# Patient Record
Sex: Female | Born: 1945 | Race: White | Hispanic: No | Marital: Single | Smoking: Never smoker
Health system: Southern US, Community
[De-identification: ages and names within clinical notes are randomized; demographics above are authoritative.]

## PROBLEM LIST (undated history)

## (undated) DIAGNOSIS — G629 Polyneuropathy, unspecified: Secondary | ICD-10-CM

## (undated) DIAGNOSIS — I440 Atrioventricular block, first degree: Secondary | ICD-10-CM

## (undated) DIAGNOSIS — N183 Chronic kidney disease, stage 3 unspecified: Secondary | ICD-10-CM

## (undated) DIAGNOSIS — I890 Lymphedema, not elsewhere classified: Secondary | ICD-10-CM

## (undated) DIAGNOSIS — E7800 Pure hypercholesterolemia, unspecified: Secondary | ICD-10-CM

## (undated) DIAGNOSIS — Z9289 Personal history of other medical treatment: Secondary | ICD-10-CM

## (undated) DIAGNOSIS — M1990 Unspecified osteoarthritis, unspecified site: Secondary | ICD-10-CM

## (undated) DIAGNOSIS — E1142 Type 2 diabetes mellitus with diabetic polyneuropathy: Secondary | ICD-10-CM

## (undated) DIAGNOSIS — I5189 Other ill-defined heart diseases: Secondary | ICD-10-CM

## (undated) DIAGNOSIS — E785 Hyperlipidemia, unspecified: Secondary | ICD-10-CM

## (undated) DIAGNOSIS — K219 Gastro-esophageal reflux disease without esophagitis: Secondary | ICD-10-CM

## (undated) DIAGNOSIS — E119 Type 2 diabetes mellitus without complications: Secondary | ICD-10-CM

## (undated) DIAGNOSIS — G4733 Obstructive sleep apnea (adult) (pediatric): Secondary | ICD-10-CM

## (undated) DIAGNOSIS — I872 Venous insufficiency (chronic) (peripheral): Secondary | ICD-10-CM

## (undated) DIAGNOSIS — Z8719 Personal history of other diseases of the digestive system: Secondary | ICD-10-CM

## (undated) DIAGNOSIS — J309 Allergic rhinitis, unspecified: Secondary | ICD-10-CM

## (undated) DIAGNOSIS — I779 Disorder of arteries and arterioles, unspecified: Secondary | ICD-10-CM

## (undated) DIAGNOSIS — G4730 Sleep apnea, unspecified: Secondary | ICD-10-CM

## (undated) DIAGNOSIS — I10 Essential (primary) hypertension: Secondary | ICD-10-CM

## (undated) DIAGNOSIS — R011 Cardiac murmur, unspecified: Secondary | ICD-10-CM

## (undated) HISTORY — PX: NASAL SINUS SURGERY: SHX719

## (undated) HISTORY — PX: TONSILLECTOMY: SUR1361

## (undated) HISTORY — DX: Obstructive sleep apnea (adult) (pediatric): G47.33

## (undated) HISTORY — PX: BREAST CYST EXCISION: SHX579

## (undated) HISTORY — PX: CYST EXCISION: SHX5701

## (undated) HISTORY — DX: Personal history of other diseases of the digestive system: Z87.19

## (undated) HISTORY — DX: Hyperlipidemia, unspecified: E78.5

---

## 1998-01-04 ENCOUNTER — Ambulatory Visit (HOSPITAL_COMMUNITY): Admission: RE | Admit: 1998-01-04 | Discharge: 1998-01-04 | Payer: Self-pay | Admitting: Family Medicine

## 1998-01-26 ENCOUNTER — Encounter: Payer: Self-pay | Admitting: Family Medicine

## 1998-01-26 ENCOUNTER — Ambulatory Visit (HOSPITAL_COMMUNITY): Admission: RE | Admit: 1998-01-26 | Discharge: 1998-01-26 | Payer: Self-pay | Admitting: Family Medicine

## 1999-02-07 ENCOUNTER — Encounter: Payer: Self-pay | Admitting: Family Medicine

## 1999-02-07 ENCOUNTER — Ambulatory Visit (HOSPITAL_COMMUNITY): Admission: RE | Admit: 1999-02-07 | Discharge: 1999-02-07 | Payer: Self-pay | Admitting: Family Medicine

## 2000-02-12 ENCOUNTER — Encounter: Payer: Self-pay | Admitting: Family Medicine

## 2000-02-12 ENCOUNTER — Ambulatory Visit (HOSPITAL_COMMUNITY): Admission: RE | Admit: 2000-02-12 | Discharge: 2000-02-12 | Payer: Self-pay | Admitting: Family Medicine

## 2001-03-06 ENCOUNTER — Ambulatory Visit (HOSPITAL_COMMUNITY): Admission: RE | Admit: 2001-03-06 | Discharge: 2001-03-06 | Payer: Self-pay | Admitting: Family Medicine

## 2001-03-06 ENCOUNTER — Encounter: Payer: Self-pay | Admitting: Family Medicine

## 2001-04-16 ENCOUNTER — Other Ambulatory Visit: Admission: RE | Admit: 2001-04-16 | Discharge: 2001-04-16 | Payer: Self-pay | Admitting: Family Medicine

## 2002-02-17 ENCOUNTER — Encounter: Admission: RE | Admit: 2002-02-17 | Discharge: 2002-05-18 | Payer: Self-pay | Admitting: Family Medicine

## 2002-03-13 ENCOUNTER — Ambulatory Visit (HOSPITAL_COMMUNITY): Admission: RE | Admit: 2002-03-13 | Discharge: 2002-03-13 | Payer: Self-pay | Admitting: Family Medicine

## 2002-03-13 ENCOUNTER — Encounter: Payer: Self-pay | Admitting: Family Medicine

## 2002-06-09 ENCOUNTER — Encounter: Admission: RE | Admit: 2002-06-09 | Discharge: 2002-09-07 | Payer: Self-pay | Admitting: Family Medicine

## 2003-01-26 ENCOUNTER — Ambulatory Visit (HOSPITAL_COMMUNITY): Admission: RE | Admit: 2003-01-26 | Discharge: 2003-01-26 | Payer: Self-pay | Admitting: *Deleted

## 2003-02-15 ENCOUNTER — Other Ambulatory Visit: Payer: Self-pay

## 2003-03-22 ENCOUNTER — Encounter: Admission: RE | Admit: 2003-03-22 | Discharge: 2003-06-20 | Payer: Self-pay | Admitting: Family Medicine

## 2003-04-05 ENCOUNTER — Ambulatory Visit (HOSPITAL_COMMUNITY): Admission: RE | Admit: 2003-04-05 | Discharge: 2003-04-05 | Payer: Self-pay | Admitting: Family Medicine

## 2004-04-25 ENCOUNTER — Ambulatory Visit: Payer: Self-pay | Admitting: Family Medicine

## 2005-05-24 ENCOUNTER — Ambulatory Visit: Payer: Self-pay | Admitting: Family Medicine

## 2005-12-09 ENCOUNTER — Emergency Department: Payer: Self-pay | Admitting: Internal Medicine

## 2006-07-09 ENCOUNTER — Ambulatory Visit: Payer: Self-pay | Admitting: Family Medicine

## 2006-07-17 ENCOUNTER — Ambulatory Visit: Payer: Self-pay | Admitting: Family Medicine

## 2007-07-11 ENCOUNTER — Ambulatory Visit: Payer: Self-pay | Admitting: Family Medicine

## 2008-02-03 ENCOUNTER — Ambulatory Visit: Payer: Self-pay | Admitting: Sports Medicine

## 2008-02-03 ENCOUNTER — Encounter (INDEPENDENT_AMBULATORY_CARE_PROVIDER_SITE_OTHER): Payer: Self-pay | Admitting: *Deleted

## 2008-02-03 DIAGNOSIS — M7670 Peroneal tendinitis, unspecified leg: Secondary | ICD-10-CM | POA: Insufficient documentation

## 2008-02-03 DIAGNOSIS — I10 Essential (primary) hypertension: Secondary | ICD-10-CM | POA: Insufficient documentation

## 2008-02-03 DIAGNOSIS — Q667 Congenital pes cavus, unspecified foot: Secondary | ICD-10-CM

## 2008-02-03 DIAGNOSIS — E119 Type 2 diabetes mellitus without complications: Secondary | ICD-10-CM | POA: Insufficient documentation

## 2008-02-03 DIAGNOSIS — E1159 Type 2 diabetes mellitus with other circulatory complications: Secondary | ICD-10-CM | POA: Insufficient documentation

## 2008-02-03 DIAGNOSIS — M7660 Achilles tendinitis, unspecified leg: Secondary | ICD-10-CM | POA: Insufficient documentation

## 2008-02-03 DIAGNOSIS — M7750 Other enthesopathy of unspecified foot: Secondary | ICD-10-CM | POA: Insufficient documentation

## 2008-02-03 HISTORY — DX: Congenital pes cavus, unspecified foot: Q66.70

## 2008-03-16 ENCOUNTER — Encounter (INDEPENDENT_AMBULATORY_CARE_PROVIDER_SITE_OTHER): Payer: Self-pay | Admitting: *Deleted

## 2008-03-16 ENCOUNTER — Ambulatory Visit: Payer: Self-pay | Admitting: Sports Medicine

## 2008-04-20 ENCOUNTER — Ambulatory Visit: Payer: Self-pay | Admitting: Sports Medicine

## 2008-04-20 DIAGNOSIS — M6530 Trigger finger, unspecified finger: Secondary | ICD-10-CM | POA: Insufficient documentation

## 2008-06-01 ENCOUNTER — Ambulatory Visit: Payer: Self-pay | Admitting: Sports Medicine

## 2008-06-01 DIAGNOSIS — M25569 Pain in unspecified knee: Secondary | ICD-10-CM | POA: Insufficient documentation

## 2008-06-15 ENCOUNTER — Telehealth (INDEPENDENT_AMBULATORY_CARE_PROVIDER_SITE_OTHER): Payer: Self-pay | Admitting: *Deleted

## 2008-06-29 ENCOUNTER — Ambulatory Visit: Payer: Self-pay | Admitting: Sports Medicine

## 2008-06-29 DIAGNOSIS — IMO0002 Reserved for concepts with insufficient information to code with codable children: Secondary | ICD-10-CM | POA: Insufficient documentation

## 2008-07-14 ENCOUNTER — Ambulatory Visit: Payer: Self-pay | Admitting: Sports Medicine

## 2008-08-10 ENCOUNTER — Ambulatory Visit: Payer: Self-pay | Admitting: Sports Medicine

## 2008-08-11 ENCOUNTER — Ambulatory Visit: Payer: Self-pay | Admitting: Family Medicine

## 2008-08-13 ENCOUNTER — Ambulatory Visit: Payer: Self-pay | Admitting: Sports Medicine

## 2008-10-19 ENCOUNTER — Ambulatory Visit: Payer: Self-pay | Admitting: Sports Medicine

## 2008-12-08 LAB — HM PAP SMEAR: HM PAP: NORMAL

## 2009-02-05 HISTORY — PX: HEEL SPUR SURGERY: SHX665

## 2009-02-24 ENCOUNTER — Ambulatory Visit: Payer: Self-pay | Admitting: Sports Medicine

## 2009-04-04 ENCOUNTER — Ambulatory Visit: Payer: Self-pay | Admitting: Sports Medicine

## 2009-06-17 ENCOUNTER — Ambulatory Visit: Payer: Self-pay | Admitting: Sports Medicine

## 2009-07-07 ENCOUNTER — Ambulatory Visit: Payer: Self-pay | Admitting: Sports Medicine

## 2009-09-05 ENCOUNTER — Ambulatory Visit: Payer: Self-pay | Admitting: Sports Medicine

## 2009-09-27 ENCOUNTER — Ambulatory Visit: Payer: Self-pay | Admitting: Family Medicine

## 2010-03-07 NOTE — Assessment & Plan Note (Signed)
Summary: FU HEEL, KNEE, THUMB PAIN   Vital Signs:  Patient profile:   65 year old female Pulse rate:   73 / minute BP sitting:   144 / 72  (right arm)  Vitals Entered By: Terese Door (September 05, 2009 8:49 AM) CC: F/U heel, knee & thumb pain   CC:  F/U heel and knee & thumb pain.  History of Present Illness: Lt AT is much better and non painful after NTG RX haa now stopped NTG  Rt great toe at IP is swollen but not painful  Rt AT post op now 9 wks and is stiff but not painful  LT knee med meniscus is OK but fell at surgery and Lat side hurts  RT trigger thumb is better p injection  Overall doing better and here for advice  Physical Exam  General:  Well-developed,well-nourished,in no acute distress; alert,appropriate and cooperative throughout examination Msk:  RT AT shows post op changes the hag;lund's deformity has been removed not TTP  LT AT is smaller and is not TTP  Left knee exam shows no effusion; stable ligaments; negative Mcmurray's and provocative meniscal tests;  However lacks some full extension non painful patellar compression; patellar and quadriceps tendons unremarkable.  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. Aaa 4grms qid 2)  Nitroglycerin 0.4 Mg/hr Pt24 (Nitroglycerin) .... 1/4 patch to affect areas x 12 hours daily 3)  Nateglinide 60 Mg Tabs (Nateglinide) .Marland Kitchen.. 1 by mouth two times a day 30 mins ac 4)  Janumet 50-1000 Mg Tabs (Sitagliptin-metformin hcl) .Marland Kitchen.. 1 by mouth bid

## 2010-03-07 NOTE — Assessment & Plan Note (Signed)
Summary: FU ACHILLES AND L KNEE PAIN   Vital Signs:  Patient profile:   65 year old female BP sitting:   160 / 60  Vitals Entered By: Lillia Pauls CMA (October 19, 2008 8:56 AM)  History of Present Illness: Left Knee knee brace does help still not as confident with left still 0pain that occurs on medail jt line left has not locked gave way one time after doing a lot of steps not much swelling frustrated that pain does come intermittently  RT AT at start of treatment pain level was 10/10 now level is on avg day 3/10  Lt AT at start was 10/10 now avergae level is 2/10  not doing many of her exercises she is faithful with NTG and does think this keeps pain level down  cerrtain shoes make haglund on RT much worse  Physical Exam  General:  obese,in no acute distress; alert,appropriate and cooperative throughout examination Msk:  RT knee exam shows minimal effusion in suprapatellar pouch area only; stable ligaments; negative Mcmurray's and provocative meniscal tests; non painful patellar compression; patellar and quadriceps tendons unremarkable some mild medial joint line tenderness on RT  LT knee exam by comparison only less swelling and no joint line tenderness  RT AT is thick but not painful except at haglunds area  LT AT is thick and irregular at insertion non tender Additional Exam:  US exam is compared to previous ones RT shows thickness about 0.7 on long scan - this is overall improvement Also shows transverse thickness of 1.7 biggest change is increased vascularity and neovessels at medial insertion and going into haglunds calcific spurring here as well  Lt is actually thicker than RT at 1.03 transverse scan shows 2.04 this is not much change from before and there are intratendinous calcifications NO doppler actiivity however  images saved   Impression & Recommendations:  Problem # 1:  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)  RT is persistently  more painful but 70% improved over start left is not changed as much with appearance but pain is minimal  she can cont NTG as it clinically seems to help her she really needs to get into exercises more - only does a few  cont with walking program reck in 3months  Orders: US EXTREMITY NON-VASC REAL-TIME IMG (72536)  Problem # 2:  MEDIAL MENISCUS TEAR, LEFT (ICD-836.0) cont use of donjoy I think she is basically stalbe with conservative care if this worsens would refer for ortho opinion  Problem # 3:  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 Aaa 4grms qid 2)  Nitroglycerin 0.4 Mg/hr Pt24 (Nitroglycerin) .... 1/4 patch to affect areas x 12 hours daily

## 2010-03-07 NOTE — Letter (Signed)
Summary: *Referral Letter  [facility]  [address]   [place], [place] [address]   Phone: [phone]  Fax: [phone]    04/04/2009 Cindy Baker, MD [facility]  Dear Cindy Hester:  Thank you in advance for agreeing to see my patient:  Cindy Hester [address] [place], [place]  [address]  Phone: [phone]  Reason for Referral: we have tried a year of conservative care for bilateral, chronic severe Achilles Tendinopathy.  Left AT has become pain free.  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  Sincerely,  Vincent Gros MD

## 2010-03-07 NOTE — Letter (Signed)
Summary: *Consult Note  [facility]  [address]   [place], [place] [address]   Phone: [phone]  Fax: [phone]    Re:    Cindy Hester [facility] DOB:    1945/03/20 Dr. Julieanne Manson [facility] Fax: [phone]  Dear Cindy Hester:    Thank you for requesting that we see the above patient for consultation. I have been following her for most of the year to try to get her Achilles tendons to heal without surgery.  She definitely wants to avoid the surgery if possible.  The left - which was the worst at onset - is pretty much symptom free.  The Right AT is still a problem and so today we tried a new [place] Brace called an Achillotrain.  We continue to use the newer tendon treatment with NTG patches which has been pretty successful in studies of chronic Achilles tendonosis.  A copy of several detailed office note will be sent under separate cover, for your review.   After today's visit, the patients current medications include: 1)  VOLTAREN 1 % GEL (DICLOFENAC SODIUM) AAA 4GRMS QID 2)  NITROGLYCERIN 0.4 MG/HR PT24 (NITROGLYCERIN) 1/4 patch to affect areas x 12 hours daily   Thank you for this consultation.  If you have any further questions regarding the care of this patient, please do not hesitate to contact me @ [phone].  Thank you for this opportunity to look after your patient.  My best reagrds to your family.   Sincerely,  Vincent Gros MD

## 2010-03-07 NOTE — Assessment & Plan Note (Signed)
Summary: F/U THUMB/FOOT,MC   Vital Signs:  Patient profile:   65 year old female BP sitting:   152 / 64  Vitals Entered By: Enid Baas MD (July 07, 2009 9:20 AM)  History of Present Illness: Now 3 weeks post AT and Haglund's surgery on RT f Dr Lajoyce Corners less signs of skin infection now finished oral antibiotic uses some topical very minimal drainage x for area of blister better motion in RT foot  RT thumb locking more wants to try injection today  BS levels running OK does not ck BS regularly     Physical Exam  General:  obese,well-nourished,in no acute distress; alert,appropriate and cooperative throughout examination Msk:  RT foot shows that area of surgery over AT is healing well swelling is down a lot there is good movement 1 small blister no drainage  RT thumb shows nodule at MCP This locks with flexion and pops through   Impression & Recommendations:  Problem # 1:  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) S/p surgery on RT  she is following with dr duda  when healed we will adjust orthotics and shoe supports to help her with walking  Problem # 2:  TRIGGER FINGER, THUMB (ICD-727.03)  cleanse with alcohol Topical analgesic spray : Ethyl choride Joint  RT ist MCP tendon nodule Approached in typical fashion with:direct infiltraion of nodule Completed without difficulty Meds:.4 of kenalog 10 anned .4 lidoca Needle:30 g Atercare instructions and Red flags advised  watch for response over next couple of weeks could repeat in 3 mos if needed  Orders: Aspirate/Inject Ganglion Cyst ([address])  Complete Medication List: 1)  Voltaren 1 % Gel (Diclofenac sodium) .... Aaa 4grms qid 2)  Nitroglycerin 0.4 Mg/hr Pt24 (Nitroglycerin) .... 1/4 patch to affect areas x 12 hours daily 3)  Nateglinide 60 Mg Tabs (Nateglinide) .Marland Kitchen.. 1 by mouth two times a day 30 mins ac 4)  Janumet 50-1000 Mg Tabs (Sitagliptin-metformin hcl) .Marland Kitchen.. 1 by mouth bid

## 2010-03-07 NOTE — Assessment & Plan Note (Signed)
Summary: F/U,MC   Vital Signs:  Patient profile:   65 year old female BP sitting:   148 / 66  Vitals Entered By: Lillia Pauls CMA (April 04, 2009 8:55 AM)  History of Present Illness: Patient with bilat Haglunds and chronic Achilles tendinosis Left side initially more painful this one has resolved all sxs RT was thickened at 1.2 cms when first seen This reduced to 0.9 cms thick with NTG and exercises  last 2 visits show no change in pain hurts daily using NTG also doing eccentric exercises  Physical Exam  General:  Well-developed,well-nourished,in no acute distress; alert,appropriate and cooperative throughout examination Msk:  Tender at lateral insertion of RT AT there is some slight puffiness here tendon feels thick but not tender to palpation no real swelling noted Additional Exam:  MSK [place] Tendon Thickness AP is still  ~ 0.90 cms There remains a marignal separtion at the deep margin and insertion swelling into retrocalcaneal bursa marked neovessels noted in distal tendon on long and trans scan  images saved   Impression & Recommendations:  Problem # 1:  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 Aaa 4grms qid 2)  Nitroglycerin 0.4 Mg/hr Pt24 (Nitroglycerin) .... 1/4 patch to affect areas x 12 hours daily  Patient Instructions: 1)  You have an appt on fri, March the 4th with Dr. Lajoyce Corners at [facility]. His office is located at [address] [phone]

## 2010-03-07 NOTE — Assessment & Plan Note (Signed)
Summary: f/u visit/bmc   Vital Signs:  Patient profile:   65 year old female Height:      67 inches Weight:      224 pounds BMI:     35.21 BP sitting:   111 / 70  Vitals Entered By: Lillia Pauls CMA (February 24, 2009 8:55 AM)  History of Present Illness: Right achilles is more painful than left.  Pain scale on left is almost pain-free.  Right's pain scale is is 5/10 where is was a 3/10 previously.  She is doing exercises and has orthotics.  She feels the orthotics gives her good support.   Left knee was painful before but feels better except when it is cold outside and when she twists wrong.   Physical Exam  General:  Well-developed,well-nourished,in no acute distress; alert,appropriate and cooperative throughout examination Msk:  Left KNee no effusion today good flex and ext no pain on seated McMurray no joint line tenderness  RT AT very TTP feels chronically thickened not warm  Lt AT feels thich not warm not TTP Additional Exam:  MSK [place] In comparison to [place] 3 mos ago: RT AT still has a lot of neovessels there is still internal tearing within the tendon neovessels are clustered in this area now 0.9 AP and was down to 0.7cm AP before  Lt AT is still 1.09 AP thickness no increase in vascular activity trans scan shows healthy appearing tendon on real intra tendinous break down   Impression & Recommendations:  Problem # 1:  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  keep up exercises  reck 6 wks after bracing  Problem # 2:  MEDIAL MENISCUS TEAR, LEFT (ICD-836.0) Assessment: Improved much improved no need for brace unless for comfort cont some simpole quad exercises  Problem # 3:  TALIPES CAVUS  (ICD-754.71) should use orhtotics when possilbe this seemed to resolve tarsal tunnel sxs  Complete Medication List: 1)  Voltaren 1 % Gel (Diclofenac sodium) .... Aaa 4grms qid 2)  Nitroglycerin 0.4 Mg/hr Pt24 (Nitroglycerin) .... 1/4 patch to affect areas x 12 hours daily

## 2010-03-07 NOTE — Assessment & Plan Note (Signed)
Summary: INJECTION THUMB,MC   Vital Signs:  Patient profile:   65 year old female BP sitting:   131 / 75  Vitals Entered By: Lillia Pauls CMA (Jun 17, 2009 8:40 AM)  History of Present Illness: Pt presents for right thumb pain and triggering that started last weekend. This same thumb started to trigger about a year ago. The pain is worse in the morning after wakes. Nothing seems to make her pain better. She is right hand dominant. She had a steroid injection last year which was helpful and she was hoping to get another injection.   She also recently had a surgery performed on her right foot by Dr. Lajoyce Corners. She had her Haglund's deformity shaved off and had her achilles tendon debrided per her report. She is worried about her wounds because they are still oozing. She mainly sees blood oozing through the surgical sites. She usually sees the drainage through her bandage. She has had multiple checks at Dr. Audrie Lia office and they recently put her on Doxycycline and a topical antibiotic for protection. She is a diabetic on Starlix and JanuMet with her last Hgb A1C being about 7.3. She does not check her blood sugars regularly. She would like a second opinion about her wounds today. Denies any blood thinners such as aspiring, NSAIDs or Vitamin C (except in a multivitamin).  Physical Exam  General:  alert and well-developed.   Head:  normocephalic and atraumatic.   Eyes:  Wears glasses Mouth:  MMM Neck:  supple.   Lungs:  normal respiratory effort.   Msk:  Right Hand: Normal inspection + TTP over the palmar aspect of her thumb at the MCP joint where a small nodule can be palpated She does trigger with thumb flexion but does not become stuck in one position yet Otherwise normal ROM Normal grip strength which is mildly decrease at the thumb because of pain Neurovascularly intact  Left Hand: Normal inspection, full ROM of all fingers, normal palpation and 5/5 grip strength Neurovascularly  intact  Right Foot: 2 suture sites adjacent to her achilles tendon with mild amount of bloody drainage No erythema or pus noted Small amount of drainage on bandage ROM and strength not tested because of recent surgery + TTP over surgical sites and achilles tendon    Impression & Recommendations:  Problem # 1:  TRIGGER FINGER, THUMB (ICD-727.03) Assessment Deteriorated 1. Would consider a steroid injection after her wounds have healed well. Will hold off on injection today because of concerns of her wounds and because she is a diabetic. 2. Recheck in 2-3 weeks 3. Can use voltaren gel that she already has at home 4 times per day to massage over the nodule to try to decrease inflammation and pain.  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 Reassured her that her wounds looked healthy 2. Placed a heel lift in her post-op shoe to decrease stretch over achilles tendon and the adjacent skin. She walked in this before leaving today and felt fairly comfortable despite that she had one leg higher than the other causing a gait change. This padding will obviously be temporary while the wounds heal.   Complete Medication List: 1)  Voltaren 1 % Gel (Diclofenac sodium) .... Aaa 4grms qid 2)  Nitroglycerin 0.4 Mg/hr Pt24 (Nitroglycerin) .... 1/4 patch to affect areas x 12 hours daily

## 2010-10-30 ENCOUNTER — Ambulatory Visit: Payer: Self-pay | Admitting: Family Medicine

## 2010-11-02 ENCOUNTER — Ambulatory Visit: Payer: Self-pay | Admitting: Sports Medicine

## 2010-11-06 ENCOUNTER — Ambulatory Visit (INDEPENDENT_AMBULATORY_CARE_PROVIDER_SITE_OTHER): Payer: BC Managed Care – PPO | Admitting: Sports Medicine

## 2010-11-06 ENCOUNTER — Encounter: Payer: Self-pay | Admitting: Sports Medicine

## 2010-11-06 VITALS — BP 169/72 | HR 75 | Ht 67.0 in | Wt 213.0 lb

## 2010-11-06 DIAGNOSIS — IMO0002 Reserved for concepts with insufficient information to code with codable children: Secondary | ICD-10-CM

## 2010-11-06 DIAGNOSIS — M79609 Pain in unspecified limb: Secondary | ICD-10-CM

## 2010-11-06 DIAGNOSIS — M79641 Pain in right hand: Secondary | ICD-10-CM

## 2010-11-06 DIAGNOSIS — M7660 Achilles tendinitis, unspecified leg: Secondary | ICD-10-CM

## 2010-11-06 DIAGNOSIS — M25539 Pain in unspecified wrist: Secondary | ICD-10-CM

## 2010-11-06 DIAGNOSIS — M6740 Ganglion, unspecified site: Secondary | ICD-10-CM

## 2010-11-06 DIAGNOSIS — M67479 Ganglion, unspecified ankle and foot: Secondary | ICD-10-CM

## 2010-11-06 MED ORDER — DICLOFENAC SODIUM 1 % TD GEL: 1.0000 "application " | Freq: Four times a day (QID) | TRANSDERMAL | Status: DC | PRN

## 2010-11-07 DIAGNOSIS — M67479 Ganglion, unspecified ankle and foot: Secondary | ICD-10-CM | POA: Insufficient documentation

## 2010-11-07 DIAGNOSIS — M654 Radial styloid tenosynovitis [de Quervain]: Secondary | ICD-10-CM | POA: Insufficient documentation

## 2010-11-07 NOTE — Assessment & Plan Note (Signed)
She will wear a thumb spica for immobilization and use ketaprofen gel for pain relief.  We can start rehab exercises for her at the next visit.

## 2010-11-07 NOTE — Assessment & Plan Note (Signed)
Draining is not indicated due to the size and rate of recurrence.  She will adjust her shoe wear to avoid aggravating the cyst.

## 2010-11-07 NOTE — Assessment & Plan Note (Signed)
Her giving out is more a sign of quad weakness than from the meniscus.  She will do quad and abductor strengthening exercises which will help.  If she continues to have pain we can consider injection of the knee.

## 2010-11-07 NOTE — Progress Notes (Signed)
  Subjective:    Patient ID: Cindy Hester, female    DOB: [birth year], 65 y.o.   MRN: [id number] HPI 65 y/o female is here with multiple complaints. She has a history of a meniscus injury which she was treating conservatively.  She has started to have pain there again.  No locking and popping but she does have some giving out going down stairs.  She has some right heel pain that is worse in the area of her achilles tendon surgery.  She has a lump and it is painful about that region.  Left AT and Haglund got better with NTG and exercises.  She has a bump on her left great toe that concerns her.  She also has right wrist pain since having her arm forcefully rotated while using a power tool   Review of Systems     Objective:   Physical Exam Left wrist: Mild swelling over compartment 2 Mild diffuse tenderness to palpation over the affected area and of the radial surface ROM is normal Grip strength and pincher strength is normal  Great toe: Small cystic mass on the PIP Non-tender  Right knee: No effusion Flexes and extends fully Medial joint line tenderness to palpation, no true mcmurrays No laxity +/- patellar tenderness  Right Ankle: There is a soft tissue mass in the area of the surgery.  There is mild tenderness but no warmth or erythema  Ultrasound:  Wrist: halo effect of the tendons in compartment 1>2.  Great toe: fluid pocket in the region of the PIP junction c/w a tiny cyst, calcification is seen here  AT: Right achilles is now attached laterally post surgically.  There is a small fluid pocket but no doppler activity in this area.   Left AT has calcification at insertion but tendon looks better and is ~ 1.0 cm vs 1.3 at thickest measure      Assessment & Plan:

## 2010-11-07 NOTE — Assessment & Plan Note (Addendum)
The area of concern to the patient is the bulk from the reattached achilles tendon as opposed to a mass.  She doesn't desire inserts for her shoes so she will try to get shoes with a bit of a heel to relieve pressure from the achilles tendon.  Keep up some of heel raise exercises

## 2010-12-19 ENCOUNTER — Encounter: Payer: Self-pay | Admitting: Sports Medicine

## 2010-12-19 ENCOUNTER — Ambulatory Visit (INDEPENDENT_AMBULATORY_CARE_PROVIDER_SITE_OTHER): Payer: BC Managed Care – PPO | Admitting: Sports Medicine

## 2010-12-19 VITALS — BP 147/74 | HR 78

## 2010-12-19 DIAGNOSIS — M654 Radial styloid tenosynovitis [de Quervain]: Secondary | ICD-10-CM

## 2010-12-19 NOTE — Assessment & Plan Note (Signed)
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  Her pain is resistant to conservative treatment which includes splinting and application of topical ketoprofen gel. At this point refill her best option for recovery is ultrasound guided injection into the tendon sheath itself.  This was performed at today's visit. Plan for continued splinting and gradual return of activity and followup in 4-6 weeks. Red flags for infection reviewed with patient who expresses understanding.

## 2010-12-19 NOTE — Patient Instructions (Signed)
Thank you for coming in today. Take it easy for the next week.  Wear the brace and continue the normal rehab instruction.  Your family is allowed to help this Thanksgiving.  Be watchful for signs of infection like bad pain, redness and fever.  Come back in 4-6 weeks for a re-check.  Measure your blood pressure a few times and see your doctor if it is consistently high.

## 2010-12-19 NOTE — Progress Notes (Signed)
Cindy Hester presents to clinic today to followup her right wrist pain. In the interval her right wrist continues to bother her. Wherever her pain is now located mostly in her thumb NTP joint. She notes pain with opposition of her thumb and ulnar deviation of her wrist. She notes that she wears her wrist brace intermittently but not at work as it interferes with her duties. Additionally she notes that she is using her ketoprofen gel intermittently as well.  She denies any trauma to the wrist and feels well otherwise.  PMH reviewed.  ROS as above otherwise neg Medications reviewed. Current Outpatient Prescriptions  Medication Sig Dispense Refill  . amLODipine (NORVASC) 10 MG tablet Take 10 mg by mouth daily.      Marland Kitchen aspirin 325 MG tablet Take 325 mg by mouth daily.        . benazepril (LOTENSIN) 40 MG tablet Take 40 mg by mouth daily.      . cholecalciferol (VITAMIN D) 1000 UNITS tablet Take 1,000 Units by mouth daily.        . CRESTOR 20 MG tablet Take 20 mg by mouth daily.      . diclofenac sodium (VOLTAREN) 1 % GEL Apply 1 application topically 4 (four) times daily as needed.  60 g  3  . fenofibrate 160 MG tablet Take 160 mg by mouth daily.      . furosemide (LASIX) 40 MG tablet Take 40 mg by mouth daily.      Marland Kitchen JANUMET 50-1000 MG per tablet Take 1 tablet by mouth daily.      . Ketoprofen POWD Place onto the skin 4 (four) times daily as needed.      Marland Kitchen losartan-hydrochlorothiazide (HYZAAR) 100-25 MG per tablet Take 1 tablet by mouth daily.      . metoprolol (TOPROL-XL) 200 MG 24 hr tablet Take 200 mg by mouth daily.      . montelukast (SINGULAIR) 10 MG tablet Take 10 mg by mouth daily.      . Multiple Vitamin (MULTIVITAMIN) tablet Take 1 tablet by mouth daily.        . naproxen (NAPROSYN) 500 MG tablet Take 500 mg by mouth daily.      . nateglinide (STARLIX) 120 MG tablet Take 120 mg by mouth daily.      Marland Kitchen omeprazole (PRILOSEC) 20 MG capsule Take 20 mg by mouth daily.           Exam:  BP  147/74  Pulse 78 Gen: Well NAD MSK: Normal appearance of right wrist.  Range of motion of wrist is also normal, as is strength. Mildly tender to palpation on the proximal first metacarpal. No tenderness in the anatomical snuff box itself. Finkelstein's test is significantly positive.  MSK [place]: Significant fluid noted around the first compartment extensor tendons.  Ultrasound guided de Quervain's injection: Consent obtained and timeout performed.  Wrist and ultrasound probe cleaned with alcohol. A sterile Tegaderm dressing was applied over the ultrasound probe. Sterile gel was then applied to the wrist and landmarks obtained with ultrasound guidance.  A TB needle was used to obtain access to the flexor tendon sheath.  Under ultrasound guidance 0.4 mL of 10 mg/mL Kenalog solution, and 0.4 mL of 1% lidocaine without epinephrine were injected into the tendon sheath under ultrasound guidance. Distention of the tendon sheath was noted. Images saved. Patient had no complications and no bleeding.

## 2011-01-15 ENCOUNTER — Ambulatory Visit: Payer: Self-pay | Admitting: Gastroenterology

## 2011-01-15 LAB — HM COLONOSCOPY: HM COLON: NORMAL

## 2011-01-17 ENCOUNTER — Ambulatory Visit: Payer: BC Managed Care – PPO | Admitting: Sports Medicine

## 2011-05-30 LAB — HM DEXA SCAN: HM Dexa Scan: NORMAL

## 2011-07-07 ENCOUNTER — Ambulatory Visit: Payer: Self-pay

## 2011-07-11 ENCOUNTER — Ambulatory Visit: Payer: Self-pay | Admitting: Podiatry

## 2011-12-17 ENCOUNTER — Ambulatory Visit: Payer: Self-pay | Admitting: Family Medicine

## 2012-05-12 ENCOUNTER — Ambulatory Visit: Payer: Self-pay | Admitting: Family Medicine

## 2012-12-22 ENCOUNTER — Ambulatory Visit: Payer: Self-pay | Admitting: Family Medicine

## 2013-12-25 ENCOUNTER — Ambulatory Visit: Payer: Self-pay | Admitting: Family Medicine

## 2013-12-25 LAB — HM MAMMOGRAPHY: HM Mammogram: NORMAL

## 2013-12-30 ENCOUNTER — Ambulatory Visit: Payer: Self-pay | Admitting: Obstetrics and Gynecology

## 2014-04-13 LAB — BASIC METABOLIC PANEL
BUN: 40 mg/dL — AB (ref 4–21)
Creatinine: 1.2 mg/dL — AB (ref <=1.1)
Glucose: 106 mg/dL
POTASSIUM: 4.8 mmol/L (ref 3.4–5.3)
SODIUM: 142 mmol/L (ref 137–147)

## 2014-04-13 LAB — LIPID PANEL
CHOLESTEROL: 164 mg/dL (ref 0–200)
HDL: 61 mg/dL (ref 35–70)
LDL Cholesterol: 74 mg/dL
LDl/HDL Ratio: 1.2
TRIGLYCERIDES: 145 mg/dL (ref 40–160)

## 2014-04-13 LAB — HEPATIC FUNCTION PANEL
ALK PHOS: 34 U/L (ref 25–125)
ALT: 15 U/L (ref 7–35)
AST: 18 U/L (ref 13–35)
Bilirubin, Total: 0.2 mg/dL

## 2014-04-13 LAB — CBC AND DIFFERENTIAL
HEMATOCRIT: 37 % (ref 36–46)
Hemoglobin: 12.2 g/dL (ref 12.0–16.0)
NEUTROS ABS: 49 /uL
Platelets: 448 10*3/uL — AB (ref 150–399)
WBC: 4.7 10^3/mL

## 2014-04-13 LAB — TSH: TSH: 1.86 u[IU]/mL (ref <=5.90)

## 2014-07-02 DIAGNOSIS — J309 Allergic rhinitis, unspecified: Secondary | ICD-10-CM | POA: Insufficient documentation

## 2014-07-02 DIAGNOSIS — E119 Type 2 diabetes mellitus without complications: Secondary | ICD-10-CM | POA: Insufficient documentation

## 2014-07-02 DIAGNOSIS — E785 Hyperlipidemia, unspecified: Secondary | ICD-10-CM | POA: Insufficient documentation

## 2014-07-02 DIAGNOSIS — M1990 Unspecified osteoarthritis, unspecified site: Secondary | ICD-10-CM | POA: Insufficient documentation

## 2014-07-02 DIAGNOSIS — A6000 Herpesviral infection of urogenital system, unspecified: Secondary | ICD-10-CM | POA: Insufficient documentation

## 2014-07-02 DIAGNOSIS — E039 Hypothyroidism, unspecified: Secondary | ICD-10-CM | POA: Insufficient documentation

## 2014-07-02 DIAGNOSIS — E669 Obesity, unspecified: Secondary | ICD-10-CM | POA: Insufficient documentation

## 2014-07-02 DIAGNOSIS — G4733 Obstructive sleep apnea (adult) (pediatric): Secondary | ICD-10-CM | POA: Insufficient documentation

## 2014-07-02 DIAGNOSIS — E559 Vitamin D deficiency, unspecified: Secondary | ICD-10-CM | POA: Insufficient documentation

## 2014-07-07 ENCOUNTER — Telehealth: Payer: Self-pay | Admitting: Family Medicine

## 2014-07-07 NOTE — Telephone Encounter (Signed)
Pt would like to speak with a nurse about replacing her CPAP machine because her machine broke. Pt stated that if she needs a new sleep study that is fine but she doesn't want to go any longer with her machine. There are some fax messages in all scripts about this. Thanks tnp

## 2014-09-20 ENCOUNTER — Encounter: Payer: Self-pay | Admitting: Family Medicine

## 2014-09-20 ENCOUNTER — Ambulatory Visit (INDEPENDENT_AMBULATORY_CARE_PROVIDER_SITE_OTHER): Payer: BLUE CROSS/BLUE SHIELD | Admitting: Family Medicine

## 2014-09-20 VITALS — BP 118/58 | HR 68 | Temp 98.5°F | Resp 16 | Wt 200.0 lb

## 2014-09-20 DIAGNOSIS — E039 Hypothyroidism, unspecified: Secondary | ICD-10-CM

## 2014-09-20 DIAGNOSIS — E118 Type 2 diabetes mellitus with unspecified complications: Secondary | ICD-10-CM | POA: Diagnosis not present

## 2014-09-20 DIAGNOSIS — I10 Essential (primary) hypertension: Secondary | ICD-10-CM

## 2014-09-20 NOTE — Progress Notes (Signed)
Patient ID: Cindy Hester, female   DOB: 16-Mar-1945, 69 y.o.   MRN: [id number]    Subjective:  HPI  Hypertension, follow-up:  BP Readings from Last 3 Encounters:  09/20/14 118/58  04/13/14 136/72  12/19/10 147/74    She was last seen for hypertension 5 months ago.  BP at that visit was 136/72. Management changes since that visit include none. She reports good compliance with treatment. She is not having side effects.  She is not exercising. Outside blood pressures are not being checked. She is experiencing none.  Cardiovascular risk factors include diabetes mellitus, dyslipidemia and hypertension.     Weight trend: stable Wt Readings from Last 3 Encounters:  09/20/14 200 lb (90.719 kg)  04/13/14 194 lb (87.998 kg)  11/06/10 213 lb (96.616 kg)   ------------------------------------------------------------------------ Pt reports that she sees Dr. Eddie Hester for her DM. She reports that her blood sugars have been running 120-160. She reports that her last A1C was 6.9 done by Dr. Eddie Hester.      Prior to Admission medications   Medication Sig Start Date End Date Taking? Authorizing Provider  amLODipine (NORVASC) 10 MG tablet Take 10 mg by mouth daily. 10/30/10  Yes Historical Provider, MD  aspirin 325 MG tablet Take 325 mg by mouth daily.     Yes Historical Provider, MD  b complex vitamins tablet Take 1 tablet by mouth daily.   Yes Historical Provider, MD  calcium carbonate (OS-CAL) 600 MG TABS tablet Take by mouth.   Yes Historical Provider, MD  canagliflozin (INVOKANA) 300 MG TABS tablet Take by mouth.   Yes Historical Provider, MD  cholecalciferol (VITAMIN D) 1000 UNITS tablet Take 1,000 Units by mouth daily.     Yes Historical Provider, MD  CRESTOR 20 MG tablet Take 20 mg by mouth daily. 08/21/10  Yes Historical Provider, MD  fenofibrate 160 MG tablet Take 160 mg by mouth daily. 10/30/10  Yes Historical Provider, MD  furosemide (LASIX) 40 MG tablet Take 40 mg by mouth daily.  10/30/10  Yes Historical Provider, MD  hydrALAZINE (APRESOLINE) 25 MG tablet Take by mouth. 10/07/13  Yes Historical Provider, MD  Liraglutide (VICTOZA) 18 MG/3ML SOPN INJECT 1.8MG  UNDER THE SKIN ONCE DAILY AS DIRECTED 09/10/14  Yes Historical Provider, MD  losartan-hydrochlorothiazide (HYZAAR) 100-25 MG per tablet Take 1 tablet by mouth daily. 10/30/10  Yes Historical Provider, MD  metFORMIN (GLUCOPHAGE) 1000 MG tablet Take by mouth.   Yes Historical Provider, MD  metoprolol (TOPROL-XL) 200 MG 24 hr tablet Take 200 mg by mouth daily. 10/30/10  Yes Historical Provider, MD  montelukast (SINGULAIR) 10 MG tablet Take 10 mg by mouth daily. 10/02/10  Yes Historical Provider, MD  Multiple Vitamin (MULTIVITAMIN) tablet Take 1 tablet by mouth daily.     Yes Historical Provider, MD  omeprazole (PRILOSEC) 20 MG capsule Take 20 mg by mouth daily.     Yes Historical Provider, MD  pioglitazone (ACTOS) 15 MG tablet Take by mouth.   Yes Historical Provider, MD  valACYclovir (VALTREX) 500 MG tablet Take by mouth. 12/03/12  Yes Historical Provider, MD  insulin lispro (HUMALOG) 100 UNIT/ML KiwkPen Inject into the skin.    Historical Provider, MD    Patient Active Problem List   Diagnosis Date Noted  . Allergic rhinitis 07/02/2014  . Diabetes 07/02/2014  . Genital herpes 07/02/2014  . HLD (hyperlipidemia) 07/02/2014  . Adult hypothyroidism 07/02/2014  . Arthritis, degenerative 07/02/2014  . Adiposity 07/02/2014  . Obstructive apnea 07/02/2014  . Avitaminosis D  07/02/2014  . Tendonitis, deQuervains 11/07/2010  . Mucous cyst of toe 11/07/2010  . MEDIAL MENISCUS TEAR, LEFT 06/29/2008  . KNEE PAIN, LEFT 06/01/2008  . TRIGGER FINGER, THUMB 04/20/2008  . AODM 02/03/2008  . ESSENTIAL HYPERTENSION, BENIGN 02/03/2008  . ACHILLES BURSITIS OR TENDINITIS 02/03/2008  . 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 TALIPES CAVUS 02/03/2008    History reviewed. No pertinent past medical history.  Social History   Social History   . Marital Status: Single    Spouse Name: N/A  . Number of Children: N/A  . Years of Education: N/A   Occupational History  . Not on file.   Social History Main Topics  . Smoking status: Never Smoker   . Smokeless tobacco: Never Used  . Alcohol Use: No  . Drug Use: No  . Sexual Activity: Not on file   Other Topics Concern  . Not on file   Social History Narrative    No Known Allergies  Review of Systems  Constitutional: Negative.   Eyes: Negative.   Respiratory: Negative.   Cardiovascular: Negative.   Gastrointestinal: Negative.   Musculoskeletal: Positive for myalgias.  Skin: Negative.   Psychiatric/Behavioral: Negative.   All other systems reviewed and are negative.   Immunization History  Administered Date(s) Administered  . Pneumococcal Conjugate-13 04/13/2014  . Pneumococcal Polysaccharide-23 11/18/2012  . Tdap 07/19/2009  . Zoster 05/26/2009   Objective:  BP 118/58 mmHg  Pulse 68  Temp(Src) 98.5 F (36.9 C) (Oral)  Resp 16  Wt 200 lb (90.719 kg)  Physical Exam  Constitutional: She is oriented to person, place, and time and well-developed, well-nourished, and in no distress.  HENT:  Head: Normocephalic and atraumatic.  Right Ear: External ear normal.  Left Ear: External ear normal.  Nose: Nose normal.  Eyes: Conjunctivae are normal.  Neck: Neck supple.  Cardiovascular: Normal rate, regular rhythm and normal heart sounds.   1 to 2/6 murmur over the right upper sternal border  Pulmonary/Chest: Effort normal and breath sounds normal.  Abdominal: Soft.  Neurological: She is alert and oriented to person, place, and time.  Skin: Skin is warm and dry.  Psychiatric: Mood, memory, affect and judgment normal.    Lab Results  Component Value Date   WBC 4.7 04/13/2014   HGB 12.2 04/13/2014   HCT 37 04/13/2014   PLT 448* 04/13/2014   CHOL 164 04/13/2014   TRIG 145 04/13/2014   HDL 61 04/13/2014   LDLCALC 74 04/13/2014   TSH 1.86 04/13/2014     CMP     Component Value Date/Time   NA 142 04/13/2014   K 4.8 04/13/2014   BUN 40* 04/13/2014   CREATININE 1.2* 04/13/2014   AST 18 04/13/2014   ALT 15 04/13/2014   ALKPHOS 34 04/13/2014    Assessment and Plan :  Essential hypertension, benign  Type 2 diabetes mellitus with complication  Hypothyroidism, unspecified hypothyroidism type  diabetes followed by Dr. Eddie Hester. Labs were stable 5 months ago. Recheck labs in 6 months. Obtain thyroid and lipids then. Renal protection with losartan presently. Blood pressure under good control.  Miguel Aschoff MD [facility] 09/20/2014 9:10 AM

## 2014-09-26 ENCOUNTER — Other Ambulatory Visit: Payer: Self-pay | Admitting: Family Medicine

## 2014-10-06 ENCOUNTER — Other Ambulatory Visit: Payer: Self-pay | Admitting: Family Medicine

## 2014-10-15 ENCOUNTER — Other Ambulatory Visit: Payer: Self-pay | Admitting: Family Medicine

## 2014-10-25 ENCOUNTER — Other Ambulatory Visit: Payer: Self-pay | Admitting: Family Medicine

## 2014-10-25 DIAGNOSIS — E785 Hyperlipidemia, unspecified: Secondary | ICD-10-CM

## 2014-10-25 NOTE — Telephone Encounter (Signed)
Last ov was on 09/20/2014 and a ov appointment is on 04/19/2015.  Thanks,

## 2014-10-26 ENCOUNTER — Other Ambulatory Visit: Payer: Self-pay | Admitting: Family Medicine

## 2014-11-07 ENCOUNTER — Other Ambulatory Visit: Payer: Self-pay | Admitting: Family Medicine

## 2014-11-17 ENCOUNTER — Other Ambulatory Visit: Payer: Self-pay | Admitting: Obstetrics and Gynecology

## 2014-11-17 DIAGNOSIS — Z1231 Encounter for screening mammogram for malignant neoplasm of breast: Secondary | ICD-10-CM

## 2014-12-29 ENCOUNTER — Other Ambulatory Visit: Payer: Self-pay | Admitting: Family Medicine

## 2014-12-29 MED ORDER — MONTELUKAST SODIUM 10 MG PO TABS: 10.0000 mg | ORAL_TABLET | Freq: Every day | ORAL | Status: DC

## 2014-12-29 NOTE — Telephone Encounter (Signed)
Cindy Hester from [facility]  calling about pt's refill on her  montelukast (SINGULAIR) 10 MG tablet  Cindy Hester states there is no more refill on this medication for pt to get a refill Cindy Hester state's pt is completely out of her medication,Please return Melinda's call @ [phone].  Thanks, CC

## 2014-12-29 NOTE — Telephone Encounter (Signed)
RX sent in-aa 

## 2015-02-02 ENCOUNTER — Other Ambulatory Visit: Payer: Self-pay | Admitting: Family Medicine

## 2015-02-15 ENCOUNTER — Other Ambulatory Visit: Payer: Self-pay | Admitting: Family Medicine

## 2015-03-08 LAB — HEMOGLOBIN A1C: HEMOGLOBIN A1C: 6.6

## 2015-03-08 LAB — BASIC METABOLIC PANEL
BUN: 33 mg/dL — AB (ref 4–21)
Creatinine: 1 mg/dL (ref 0.5–1.1)
Glucose: 140 mg/dL
Potassium: 4 mmol/L (ref 3.4–5.3)
SODIUM: 142 mmol/L (ref 137–147)

## 2015-03-16 ENCOUNTER — Ambulatory Visit
Admission: RE | Admit: 2015-03-16 | Discharge: 2015-03-16 | Disposition: A | Discharge status: Home / Self Care | Payer: BC Managed Care – PPO | Source: Ambulatory Visit | Attending: Obstetrics and Gynecology | Admitting: Obstetrics and Gynecology

## 2015-03-16 DIAGNOSIS — Z1231 Encounter for screening mammogram for malignant neoplasm of breast: Secondary | ICD-10-CM | POA: Diagnosis present

## 2015-03-17 ENCOUNTER — Telehealth: Payer: Self-pay | Admitting: Family Medicine

## 2015-03-17 NOTE — Telephone Encounter (Signed)
Pt called saying she needs an order for her CPAP machine to get any and all replacements or supplies as needed.  She would like to have it faxed to her at [phone]  Thanks Con Memos

## 2015-03-18 NOTE — Telephone Encounter (Signed)
Will await for dr. Rosanna Randy to have this signed.-aa

## 2015-03-21 NOTE — Telephone Encounter (Signed)
Order wrote out and faxed over to the patient. Patient aware-aa

## 2015-04-18 ENCOUNTER — Other Ambulatory Visit: Payer: Self-pay | Admitting: Family Medicine

## 2015-04-19 ENCOUNTER — Encounter: Payer: Self-pay | Admitting: Family Medicine

## 2015-04-19 ENCOUNTER — Ambulatory Visit (INDEPENDENT_AMBULATORY_CARE_PROVIDER_SITE_OTHER): Payer: BLUE CROSS/BLUE SHIELD | Admitting: Family Medicine

## 2015-04-19 ENCOUNTER — Ambulatory Visit
Admission: RE | Admit: 2015-04-19 | Discharge: 2015-04-19 | Disposition: A | Discharge status: Home / Self Care | Payer: BLUE CROSS/BLUE SHIELD | Source: Ambulatory Visit | Attending: Family Medicine | Admitting: Family Medicine

## 2015-04-19 VITALS — BP 128/52 | HR 76 | Temp 98.1°F | Resp 12 | Ht 66.75 in | Wt 192.0 lb

## 2015-04-19 DIAGNOSIS — K219 Gastro-esophageal reflux disease without esophagitis: Secondary | ICD-10-CM | POA: Diagnosis not present

## 2015-04-19 DIAGNOSIS — Z0000 Encounter for general adult medical examination without abnormal findings: Secondary | ICD-10-CM | POA: Diagnosis not present

## 2015-04-19 DIAGNOSIS — Z794 Long term (current) use of insulin: Secondary | ICD-10-CM | POA: Diagnosis not present

## 2015-04-19 DIAGNOSIS — E118 Type 2 diabetes mellitus with unspecified complications: Secondary | ICD-10-CM

## 2015-04-19 DIAGNOSIS — M1711 Unilateral primary osteoarthritis, right knee: Secondary | ICD-10-CM | POA: Diagnosis not present

## 2015-04-19 DIAGNOSIS — M25561 Pain in right knee: Secondary | ICD-10-CM

## 2015-04-19 DIAGNOSIS — E785 Hyperlipidemia, unspecified: Secondary | ICD-10-CM | POA: Diagnosis not present

## 2015-04-19 LAB — POCT URINALYSIS DIPSTICK
BILIRUBIN UA: NEGATIVE
Glucose, UA: 2000
KETONES UA: NEGATIVE
Leukocytes, UA: NEGATIVE
NITRITE UA: NEGATIVE
PH UA: 6.5
PROTEIN UA: NEGATIVE
RBC UA: NEGATIVE
SPEC GRAV UA: 1.02
UROBILINOGEN UA: 0.2

## 2015-04-19 LAB — MICROALBUMIN, URINE

## 2015-04-19 MED ORDER — GLUCOSAMINE-CHONDROITIN 500-400 MG PO TABS: 1.0000 | ORAL_TABLET | Freq: Three times a day (TID) | ORAL | Status: DC

## 2015-04-19 MED ORDER — PANTOPRAZOLE SODIUM 40 MG PO TBEC: 40.0000 mg | DELAYED_RELEASE_TABLET | Freq: Every morning | ORAL | Status: DC

## 2015-04-19 MED ORDER — RANITIDINE HCL 150 MG PO TABS: 150.0000 mg | ORAL_TABLET | Freq: Every day | ORAL | Status: DC

## 2015-04-19 NOTE — Progress Notes (Signed)
Patient ID: Cindy Hester, female   DOB: 04/16/1945, 70 y.o.   MRN: [id number]  Visit Date: 04/19/2015  Today's Provider: Wilhemena Durie, MD   Chief Complaint  Patient presents with  . Annual Exam   Subjective:  Cindy Hester is a 70 y.o. female who presents today for health maintenance and complete physical. She feels well. She reports exercising none. She reports she is sleeping well-she has sleep apnea and she uses CPAP every night and tolerates it well. She had labs done in January through [facility]-Vitamin B12, BMP and A1C.  Immunization History  Administered Date(s) Administered  . Pneumococcal Conjugate-13 04/13/2014  . Pneumococcal Polysaccharide-23 11/18/2012  . Tdap 07/19/2009  . Zoster 05/26/2009   Last Colonoscopy 01/15/11 normal, repeat 10 years  Mammogram 03/16/15 normal  BMD 12/30/13 normal  Flu shot in October 2016 per patient  Pap smear with Dr. Truitt Merle per patient and that was normal  Outpatient Encounter Prescriptions as of 04/19/2015  Medication Sig Note  . amLODipine (NORVASC) 10 MG tablet take 1 tablet by mouth once daily   . aspirin 325 MG tablet Take 325 mg by mouth daily.     . calcium carbonate (OS-CAL) 600 MG TABS tablet Take by mouth. 07/02/2014: Received from: [facility]  . canagliflozin (INVOKANA) 300 MG TABS tablet Take 300 mg by mouth daily before breakfast.  07/02/2014: Received from: [facility]  . cholecalciferol (VITAMIN D) 1000 UNITS tablet Take 1,000 Units by mouth daily.     . clotrimazole-betamethasone (LOTRISONE) cream apply twice a day to three times a day take affected area 04/19/2015: Received from: External Pharmacy  . fenofibrate 160 MG tablet TAKE 1 BY MOUTH AT BEDTIME   . furosemide (LASIX) 40 MG tablet Take 40 mg by mouth daily.   . hydrALAZINE (APRESOLINE) 25 MG tablet take 1 tablet by mouth twice a day   . Liraglutide (VICTOZA) 18 MG/3ML SOPN INJECT 1.8MG  UNDER THE SKIN ONCE  DAILY AS DIRECTED 09/20/2014: Received from: [facility]  . losartan-hydrochlorothiazide (HYZAAR) 100-25 MG tablet take 1 tablet by mouth once daily   . metFORMIN (GLUCOPHAGE) 1000 MG tablet Take 1,000 mg by mouth 2 (two) times daily with a meal.  07/02/2014: Received from: [facility]  . metoprolol (TOPROL-XL) 200 MG 24 hr tablet take 1 tablet by mouth once daily   . montelukast (SINGULAIR) 10 MG tablet Take 1 tablet (10 mg total) by mouth daily.   . Multiple Vitamin (MULTIVITAMIN) tablet Take 1 tablet by mouth daily.     . naproxen (NAPROSYN) 500 MG tablet Take 500 mg by mouth 2 (two) times daily as needed.   Marland Kitchen omeprazole (PRILOSEC) 20 MG capsule Take 20 mg by mouth daily.     . phentermine 30 MG capsule Take 30 mg by mouth every morning.   . pioglitazone (ACTOS) 15 MG tablet Take 15 mg by mouth daily. Patient takes every other day right now per endocrinologist. 07/02/2014: Received from: [facility]  . rosuvastatin (CRESTOR) 20 MG tablet take 1 tablet by mouth at bedtime   . valACYclovir (VALTREX) 500 MG tablet take 1 tablet by mouth twice a day if needed   . vitamin B-12 (CYANOCOBALAMIN) 1000 MCG tablet Take 1,000 mcg by mouth daily.   . [DISCONTINUED] b complex vitamins tablet Take 1 tablet by mouth daily.   . [DISCONTINUED] insulin lispro (HUMALOG) 100 UNIT/ML KiwkPen Inject into the skin. 07/02/2014: Medication taken as needed.  Received from: [facility]   No facility-administered encounter medications on file as of 04/19/2015.    Review of Systems  Constitutional: Negative.   HENT: Negative.   Eyes: Negative.   Respiratory: Negative.   Cardiovascular: Negative.   Gastrointestinal: Positive for diarrhea and constipation.       Patient has chronic indigestion in each morning. She had this despite Prilosec. She does a lot of "belching".  Endocrine: Negative.   Genitourinary: Negative.   Musculoskeletal: Positive for  arthralgias (knee pain-right).       Chronic right knee pain which seems to be worse when she is sitting  Skin: Negative.   Allergic/Immunologic: Positive for environmental allergies.  Neurological: Negative.   Hematological: Negative.   Psychiatric/Behavioral: Negative.     Social History   Social History  . Marital Status: Single    Spouse Name: N/A  . Number of Children: N/A  . Years of Education: N/A   Occupational History  . Not on file.   Social History Main Topics  . Smoking status: Never Smoker   . Smokeless tobacco: Never Used  . Alcohol Use: Yes     Comment: maybe 1 drink once a month  . Drug Use: No  . Sexual Activity: Not on file   Other Topics Concern  . Not on file   Social History Narrative    Patient Active Problem List   Diagnosis Date Noted  . Allergic rhinitis 07/02/2014  . Diabetes ([place]) 07/02/2014  . Genital herpes 07/02/2014  . HLD (hyperlipidemia) 07/02/2014  . Adult hypothyroidism 07/02/2014  . Arthritis, degenerative 07/02/2014  . Adiposity 07/02/2014  . Obstructive apnea 07/02/2014  . Avitaminosis D 07/02/2014  . Tendonitis, deQuervains 11/07/2010  . Mucous cyst of toe 11/07/2010  . MEDIAL MENISCUS TEAR, LEFT 06/29/2008  . KNEE PAIN, LEFT 06/01/2008  . TRIGGER FINGER, THUMB 04/20/2008  . AODM 02/03/2008  . ESSENTIAL HYPERTENSION, BENIGN 02/03/2008  . ACHILLES BURSITIS OR TENDINITIS 02/03/2008  . 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 TALIPES CAVUS 02/03/2008    Past Surgical History  Procedure Laterality Date  . Heel spur surgery Right 2011  . Cesarean section  1986  . Nasal sinus surgery    . Cyst excision      from left breast  . Tonsillectomy    . Breast cyst excision Left     removed two times    Her family history includes Breast cancer in her paternal aunt and paternal aunt; CAD in her father; Congestive Heart Failure in her father; Diabetes in her mother; Glaucoma in her father; Hyperlipidemia in her brother;  Hypertension in her father and mother; Stroke in her mother.     Patient Care Team: Jerrol Banana., MD as PCP - General (Family Medicine)     Objective:   Vitals:  Filed Vitals:   04/19/15 0900  BP: 128/52  Pulse: 76  Temp: 98.1 F (36.7 C)  Resp: 12  Height: 5' 6.75" (1.695 m)  Weight: 192 lb (87.091 kg)    Physical Exam  Constitutional: She is oriented to person, place, and time. She appears well-developed and well-nourished.  HENT:  Head: Normocephalic and atraumatic.  Right Ear: External ear normal.  Left Ear: External ear normal.  Nose: Nose normal.  Mouth/Throat: Oropharynx is clear and moist.  Eyes: Conjunctivae are normal. Pupils are equal, round, and reactive to light.  Neck: Normal range of motion. Neck supple.  Cardiovascular: Normal rate, regular rhythm, normal heart sounds and intact distal pulses.  No murmur heard. Pulmonary/Chest: Effort normal and breath sounds normal. No respiratory distress. She has no wheezes.  Abdominal: Soft. Bowel sounds are normal. There is no tenderness. There is no rebound.  Musculoskeletal: She exhibits no edema or tenderness.  Neurological: She is alert and oriented to person, place, and time.  Skin: No rash noted.  Psychiatric: She has a normal mood and affect. Her behavior is normal. Judgment and thought content normal.     Depression Screen PHQ 2/9 Scores 04/19/2015  PHQ - 2 Score 0      Assessment & Plan:   1. Annual physical exam  Patient sees Dr. Fredonia Highland for pap. She is up to date with pap smear, mammogram. Immunizations are up to date. UA is normal except for sugar in her urine as to be expected. Follows endocrinologist for her diabetes at [facility]. - POCT urinalysis dipstick - CBC with Differential/Platelet - Lipid Panel With LDL/HDL Ratio - TSH - Hepatic function panel  2. Gastroesophageal reflux disease, esophagitis presence not specified  Not controlled well on Prilosec OTC 20 mg 1 daily.  She use to be on Omeprazole RX but once it went to OTC medication it was cheaper for her to get it OTC. Will stop prilosec and try Pantoprazole 1 tablet in the morning and Ranitidine 1 tablet in the evening. - pantoprazole (PROTONIX) 40 MG tablet; Take 1 tablet (40 mg total) by mouth every morning.  Dispense: 30 tablet; Refill: 12 - ranitidine (ZANTAC) 150 MG tablet; Take 1 tablet (150 mg total) by mouth at bedtime.  Dispense: 30 tablet; Refill: 12  3. Hyperlipidemia Check labs. - Lipid Panel With LDL/HDL Ratio - Hepatic function panel  4. Type 2 diabetes mellitus with complication, with long-term current use of insulin ([place])  Follows endocrinologist-Dr. Eddie Dibbles at [facility].  5. Right knee pain New. Try OTC Glucosamine Chondroitin, pending xray results, follow as needed. - DG Knee Complete 4 Views Right; Future  OTC glucosamine-chondroitin (MAX GLUCOSAMINE CHONDROITIN) 500-400 MG tablet; Take 1 tablet by mouth 3 (three) times daily.  Dispense: 30 tablet; Refill: 0    Patient was seen and examined by Dr. Eulas Post and note was scribed by Theressa Millard, RMA.

## 2015-04-19 NOTE — Patient Instructions (Signed)
For knee pain try over the counter Glucosamine Chondroitin.

## 2015-04-20 ENCOUNTER — Telehealth: Payer: Self-pay

## 2015-04-20 LAB — HEPATIC FUNCTION PANEL
ALK PHOS: 38 IU/L — AB (ref 39–117)
ALT: 17 IU/L (ref 0–32)
AST: 17 IU/L (ref 0–40)
Albumin: 4.4 g/dL (ref 3.6–4.8)
Bilirubin Total: <0.2 mg/dL (ref 0.0–1.2)
Bilirubin, Direct: 0.08 mg/dL (ref 0.00–0.40)
TOTAL PROTEIN: 6.4 g/dL (ref 6.0–8.5)

## 2015-04-20 LAB — CBC WITH DIFFERENTIAL/PLATELET
BASOS ABS: 0 10*3/uL (ref 0.0–0.2)
Basos: 0 %
EOS (ABSOLUTE): 0.2 10*3/uL (ref 0.0–0.4)
Eos: 4 %
Hematocrit: 39 % (ref 34.0–46.6)
Hemoglobin: 12.6 g/dL (ref 11.1–15.9)
IMMATURE GRANULOCYTES: 0 %
Immature Grans (Abs): 0 10*3/uL (ref 0.0–0.1)
LYMPHS: 36 %
Lymphocytes Absolute: 1.8 10*3/uL (ref 0.7–3.1)
MCH: 26.4 pg — AB (ref 26.6–33.0)
MCHC: 32.3 g/dL (ref 31.5–35.7)
MCV: 82 fL (ref 79–97)
MONOCYTES: 11 %
Monocytes Absolute: 0.6 10*3/uL (ref 0.1–0.9)
NEUTROS PCT: 49 %
Neutrophils Absolute: 2.4 10*3/uL (ref 1.4–7.0)
PLATELETS: 332 10*3/uL (ref 150–379)
RBC: 4.77 x10E6/uL (ref 3.77–5.28)
RDW: 15.1 % (ref 12.3–15.4)
WBC: 5 10*3/uL (ref 3.4–10.8)

## 2015-04-20 LAB — LIPID PANEL WITH LDL/HDL RATIO
CHOLESTEROL TOTAL: 137 mg/dL (ref 100–199)
HDL: 56 mg/dL (ref >39)
LDL CALC: 63 mg/dL (ref 0–99)
LDl/HDL Ratio: 1.1 ratio units (ref 0.0–3.2)
TRIGLYCERIDES: 90 mg/dL (ref 0–149)
VLDL CHOLESTEROL CAL: 18 mg/dL (ref 5–40)

## 2015-04-20 LAB — TSH: TSH: 1.09 u[IU]/mL (ref 0.450–4.500)

## 2015-04-20 NOTE — Telephone Encounter (Signed)
Patient advised as directed below. Patient verbalized understanding.  

## 2015-04-20 NOTE — Telephone Encounter (Signed)
-----   Message from Jerrol Banana., MD sent at 04/19/2015 11:10 AM EDT ----- Arthritis present. Consider orthopedic referral if she does not improve.

## 2015-05-10 DIAGNOSIS — J301 Allergic rhinitis due to pollen: Secondary | ICD-10-CM | POA: Diagnosis not present

## 2015-05-10 DIAGNOSIS — J3089 Other allergic rhinitis: Secondary | ICD-10-CM | POA: Diagnosis not present

## 2015-05-12 DIAGNOSIS — J301 Allergic rhinitis due to pollen: Secondary | ICD-10-CM | POA: Diagnosis not present

## 2015-05-12 DIAGNOSIS — J3089 Other allergic rhinitis: Secondary | ICD-10-CM | POA: Diagnosis not present

## 2015-05-13 ENCOUNTER — Other Ambulatory Visit: Payer: Self-pay | Admitting: Family Medicine

## 2015-05-17 DIAGNOSIS — J3089 Other allergic rhinitis: Secondary | ICD-10-CM | POA: Diagnosis not present

## 2015-05-17 DIAGNOSIS — H1045 Other chronic allergic conjunctivitis: Secondary | ICD-10-CM | POA: Diagnosis not present

## 2015-05-17 DIAGNOSIS — J301 Allergic rhinitis due to pollen: Secondary | ICD-10-CM | POA: Diagnosis not present

## 2015-05-26 DIAGNOSIS — J301 Allergic rhinitis due to pollen: Secondary | ICD-10-CM | POA: Diagnosis not present

## 2015-05-26 DIAGNOSIS — J3089 Other allergic rhinitis: Secondary | ICD-10-CM | POA: Diagnosis not present

## 2015-05-27 DIAGNOSIS — H2512 Age-related nuclear cataract, left eye: Secondary | ICD-10-CM | POA: Diagnosis not present

## 2015-05-31 DIAGNOSIS — J301 Allergic rhinitis due to pollen: Secondary | ICD-10-CM | POA: Diagnosis not present

## 2015-05-31 DIAGNOSIS — J3089 Other allergic rhinitis: Secondary | ICD-10-CM | POA: Diagnosis not present

## 2015-06-01 ENCOUNTER — Other Ambulatory Visit: Payer: Self-pay | Admitting: Family Medicine

## 2015-06-07 DIAGNOSIS — J3089 Other allergic rhinitis: Secondary | ICD-10-CM | POA: Diagnosis not present

## 2015-06-07 DIAGNOSIS — J301 Allergic rhinitis due to pollen: Secondary | ICD-10-CM | POA: Diagnosis not present

## 2015-06-21 DIAGNOSIS — J3089 Other allergic rhinitis: Secondary | ICD-10-CM | POA: Diagnosis not present

## 2015-06-21 DIAGNOSIS — J301 Allergic rhinitis due to pollen: Secondary | ICD-10-CM | POA: Diagnosis not present

## 2015-06-23 DIAGNOSIS — J3089 Other allergic rhinitis: Secondary | ICD-10-CM | POA: Diagnosis not present

## 2015-06-23 DIAGNOSIS — J301 Allergic rhinitis due to pollen: Secondary | ICD-10-CM | POA: Diagnosis not present

## 2015-06-28 DIAGNOSIS — J301 Allergic rhinitis due to pollen: Secondary | ICD-10-CM | POA: Diagnosis not present

## 2015-06-28 DIAGNOSIS — J3089 Other allergic rhinitis: Secondary | ICD-10-CM | POA: Diagnosis not present

## 2015-07-06 ENCOUNTER — Ambulatory Visit: Payer: BLUE CROSS/BLUE SHIELD | Admitting: Family Medicine

## 2015-07-07 DIAGNOSIS — J301 Allergic rhinitis due to pollen: Secondary | ICD-10-CM | POA: Diagnosis not present

## 2015-07-07 DIAGNOSIS — J3089 Other allergic rhinitis: Secondary | ICD-10-CM | POA: Diagnosis not present

## 2015-07-12 ENCOUNTER — Telehealth: Payer: Self-pay | Admitting: Family Medicine

## 2015-07-12 DIAGNOSIS — J301 Allergic rhinitis due to pollen: Secondary | ICD-10-CM | POA: Diagnosis not present

## 2015-07-12 DIAGNOSIS — J3089 Other allergic rhinitis: Secondary | ICD-10-CM | POA: Diagnosis not present

## 2015-07-12 NOTE — Telephone Encounter (Signed)
Patient advised that she is up to date with her pneumonia vaccines.  ED

## 2015-07-12 NOTE — Telephone Encounter (Signed)
Pt wants someone to check her records and see if she has had the pneumonia vaccines.    Her call back is [phone]  Thanks Con Memos

## 2015-07-22 DIAGNOSIS — J301 Allergic rhinitis due to pollen: Secondary | ICD-10-CM | POA: Diagnosis not present

## 2015-07-22 DIAGNOSIS — J3089 Other allergic rhinitis: Secondary | ICD-10-CM | POA: Diagnosis not present

## 2015-07-26 DIAGNOSIS — J3089 Other allergic rhinitis: Secondary | ICD-10-CM | POA: Diagnosis not present

## 2015-07-26 DIAGNOSIS — J301 Allergic rhinitis due to pollen: Secondary | ICD-10-CM | POA: Diagnosis not present

## 2015-08-02 DIAGNOSIS — E119 Type 2 diabetes mellitus without complications: Secondary | ICD-10-CM | POA: Diagnosis not present

## 2015-08-02 DIAGNOSIS — J301 Allergic rhinitis due to pollen: Secondary | ICD-10-CM | POA: Diagnosis not present

## 2015-08-02 DIAGNOSIS — J3089 Other allergic rhinitis: Secondary | ICD-10-CM | POA: Diagnosis not present

## 2015-08-08 ENCOUNTER — Encounter: Payer: Self-pay | Admitting: Family Medicine

## 2015-08-08 ENCOUNTER — Ambulatory Visit (INDEPENDENT_AMBULATORY_CARE_PROVIDER_SITE_OTHER): Payer: BLUE CROSS/BLUE SHIELD | Admitting: Family Medicine

## 2015-08-08 VITALS — BP 132/58 | HR 66 | Temp 98.2°F | Resp 16 | Wt 193.0 lb

## 2015-08-08 DIAGNOSIS — E785 Hyperlipidemia, unspecified: Secondary | ICD-10-CM | POA: Diagnosis not present

## 2015-08-08 DIAGNOSIS — E118 Type 2 diabetes mellitus with unspecified complications: Secondary | ICD-10-CM

## 2015-08-08 DIAGNOSIS — J309 Allergic rhinitis, unspecified: Secondary | ICD-10-CM | POA: Diagnosis not present

## 2015-08-08 DIAGNOSIS — Z794 Long term (current) use of insulin: Secondary | ICD-10-CM | POA: Diagnosis not present

## 2015-08-08 DIAGNOSIS — K219 Gastro-esophageal reflux disease without esophagitis: Secondary | ICD-10-CM

## 2015-08-08 DIAGNOSIS — I10 Essential (primary) hypertension: Secondary | ICD-10-CM | POA: Diagnosis not present

## 2015-08-08 DIAGNOSIS — E039 Hypothyroidism, unspecified: Secondary | ICD-10-CM

## 2015-08-08 MED ORDER — ROSUVASTATIN CALCIUM 20 MG PO TABS: 20.0000 mg | ORAL_TABLET | Freq: Every day | ORAL | Status: DC

## 2015-08-08 MED ORDER — SUCRALFATE 1 G PO TABS: 1.0000 g | ORAL_TABLET | Freq: Three times a day (TID) | ORAL | Status: DC

## 2015-08-08 NOTE — Patient Instructions (Signed)
Try over the counter Glucosamine-Chondroitin for knee/joint pain.

## 2015-08-08 NOTE — Progress Notes (Signed)
Patient ID: Cindy Hester, female   DOB: 11-18-1945, 70 y.o.   MRN: [id number]    Subjective:  HPI GERD- Pt is here for a 2 month follow up for GERD. She was started on Pantoprazole am and Ranitidine pm. She reports that she has noticed a slight difference but she is still having a lot of belching. 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 She is taking Naproxen but only as needed.   Knee pain- She was to try OTC Glucosamine- Chrondroitin but she forgot about it and has not tried it. She is still having the pain but it is not as bad as in the past.  Allergic rhinitis-Patient states she feels a lot better since starting allergy shots in 2016.  Prior to Admission medications   Medication Sig Start Date End Date Taking? Authorizing Provider  amLODipine (NORVASC) 10 MG tablet take 1 tablet by mouth once daily 04/18/15  Yes Richard Maceo Pro., MD  aspirin 325 MG tablet Take 325 mg by mouth daily.     Yes Historical Provider, MD  calcium carbonate (OS-CAL) 600 MG TABS tablet Take by mouth.   Yes Historical Provider, MD  canagliflozin (INVOKANA) 300 MG TABS tablet Take 300 mg by mouth daily before breakfast.    Yes Historical Provider, MD  cholecalciferol (VITAMIN D) 1000 UNITS tablet Take 1,000 Units by mouth daily.     Yes Historical Provider, MD  fenofibrate 160 MG tablet TAKE 1 BY MOUTH AT BEDTIME 06/01/15  Yes Richard Maceo Pro., MD  furosemide (LASIX) 40 MG tablet Take 40 mg by mouth daily. 10/30/10  Yes Historical Provider, MD  hydrALAZINE (APRESOLINE) 25 MG tablet take 1 tablet by mouth twice a day 10/06/14  Yes Richard Maceo Pro., MD  Liraglutide (VICTOZA) 18 MG/3ML SOPN INJECT 1.8MG  UNDER THE SKIN ONCE DAILY AS DIRECTED 09/10/14  Yes Historical Provider, MD  losartan-hydrochlorothiazide (HYZAAR) 100-25 MG tablet take 1 tablet by mouth once daily 11/08/14  Yes Richard Maceo Pro., MD  metFORMIN (GLUCOPHAGE) 1000 MG tablet Take 1,000 mg by mouth 2 (two)  times daily with a meal.    Yes Historical Provider, MD  metoprolol (TOPROL-XL) 200 MG 24 hr tablet take 1 tablet by mouth once daily 11/08/14  Yes Richard Maceo Pro., MD  montelukast (SINGULAIR) 10 MG tablet Take 1 tablet (10 mg total) by mouth daily. 12/29/14  Yes Richard Maceo Pro., MD  Multiple Vitamin (MULTIVITAMIN) tablet Take 1 tablet by mouth daily.     Yes Historical Provider, MD  Multiple Vitamins-Minerals (HAIR SKIN AND NAILS FORMULA PO) Take by mouth.   Yes Historical Provider, MD  naproxen (NAPROSYN) 500 MG tablet Take 500 mg by mouth 2 (two) times daily as needed.   Yes Historical Provider, MD  pantoprazole (PROTONIX) 40 MG tablet Take 1 tablet (40 mg total) by mouth every morning. 04/19/15  Yes Richard Maceo Pro., MD  phentermine 30 MG capsule Take 30 mg by mouth every morning.   Yes Historical Provider, MD  pioglitazone (ACTOS) 15 MG tablet Take 15 mg by mouth daily. Patient takes every other day right now per endocrinologist.   Yes Historical Provider, MD  ranitidine (ZANTAC) 150 MG tablet Take 1 tablet (150 mg total) by mouth at bedtime. 04/19/15  Yes Richard Maceo Pro., MD  rosuvastatin (CRESTOR) 20 MG tablet take 1 tablet by mouth at bedtime 05/13/15  Yes Richard Maceo Pro., MD  valACYclovir (VALTREX) 500 MG tablet take 1 tablet by mouth twice a day if needed 10/26/14  Yes Richard Maceo Pro., MD  vitamin B-12 (CYANOCOBALAMIN) 1000 MCG tablet Take 1,000 mcg by mouth daily.   Yes Historical Provider, MD  glucosamine-chondroitin (MAX GLUCOSAMINE CHONDROITIN) 500-400 MG tablet Take 1 tablet by mouth 3 (three) times daily. Patient not taking: Reported on 08/08/2015 04/19/15   Jerrol Banana., MD    Patient Active Problem List   Diagnosis Date Noted  . Allergic rhinitis 07/02/2014  . Diabetes ([place]) 07/02/2014  . Genital herpes 07/02/2014  . HLD (hyperlipidemia) 07/02/2014  . Adult hypothyroidism 07/02/2014  . Arthritis, degenerative 07/02/2014  . Adiposity  07/02/2014  . Obstructive apnea 07/02/2014  . Avitaminosis D 07/02/2014  . Tendonitis, deQuervains 11/07/2010  . Mucous cyst of toe 11/07/2010  . MEDIAL MENISCUS TEAR, LEFT 06/29/2008  . KNEE PAIN, LEFT 06/01/2008  . TRIGGER FINGER, THUMB 04/20/2008  . AODM 02/03/2008  . ESSENTIAL HYPERTENSION, BENIGN 02/03/2008  . ACHILLES BURSITIS OR TENDINITIS 02/03/2008  . 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 TALIPES CAVUS 02/03/2008    History reviewed. No pertinent past medical history.  Social History   Social History  . Marital Status: Single    Spouse Name: N/A  . Number of Children: N/A  . Years of Education: N/A   Occupational History  . Not on file.   Social History Main Topics  . Smoking status: Never Smoker   . Smokeless tobacco: Never Used  . Alcohol Use: Yes     Comment: maybe 1 drink once a month  . Drug Use: No  . Sexual Activity: Not on file   Other Topics Concern  . Not on file   Social History Narrative    No Known Allergies  Review of Systems  Constitutional: Negative.   HENT: Negative.   Eyes: Negative.   Respiratory: Negative.   Cardiovascular: Negative.   Gastrointestinal: Negative.        Belching  Genitourinary: Negative.   Musculoskeletal: Positive for joint pain.  Skin: Negative.   Neurological: Negative.   Endo/Heme/Allergies: Negative.   Psychiatric/Behavioral: Negative.     Immunization History  Administered Date(s) Administered  . Influenza-Unspecified 10/07/2014  . Pneumococcal Conjugate-13 04/13/2014  . Pneumococcal Polysaccharide-23 11/18/2012  . Tdap 07/19/2009  . Zoster 05/26/2009   Objective:  BP 132/58 mmHg  Pulse 66  Temp(Src) 98.2 F (36.8 C) (Oral)  Resp 16  Wt 193 lb (87.544 kg)  Physical Exam  Constitutional: She is oriented to person, place, and time and well-developed, well-nourished, and in no distress.  HENT:  Head: Normocephalic and atraumatic.  Right Ear: External ear normal.  Left Ear: External ear  normal.  Nose: Nose normal.  Eyes: Conjunctivae and EOM are normal. Pupils are equal, round, and reactive to light.  Neck: Neck supple.  Cardiovascular: Normal rate, regular rhythm, normal heart sounds and intact distal pulses.   Pulmonary/Chest: Effort normal and breath sounds normal.  Abdominal: Soft. Bowel sounds are normal.  Musculoskeletal: Normal range of motion.  Neurological: She is alert and oriented to person, place, and time. She has normal reflexes. Gait normal. GCS score is 15.  Skin: Skin is warm and dry.  Psychiatric: Mood, memory, affect and judgment normal.    Lab Results  Component Value Date   WBC 5.0 04/19/2015   HGB 12.2 04/13/2014   HCT 39.0 04/19/2015   PLT 332 04/19/2015   CHOL 137 04/19/2015   TRIG 90 04/19/2015  HDL 56 04/19/2015   LDLCALC 63 04/19/2015   TSH 1.090 04/19/2015   HGBA1C 6.6 03/08/2015   MICROALBUR 50-sees endocrinologist. 04/19/2015    CMP     Component Value Date/Time   NA 142 03/08/2015   K 4.0 03/08/2015   BUN 33* 03/08/2015   CREATININE 1.0 03/08/2015   PROT 6.4 04/19/2015 1002   ALBUMIN 4.4 04/19/2015 1002   AST 17 04/19/2015 1002   ALT 17 04/19/2015 1002   ALKPHOS 38* 04/19/2015 1002   BILITOT <0.2 04/19/2015 1002    Assessment and Plan :  1. Essential hypertension, benign Controlled.  2. Hypothyroidism, unspecified hypothyroidism type   3. Type 2 diabetes mellitus with complication, with long-term current use of insulin ([place]) Per Dr Eddie Dibbles.  4. HLD (hyperlipidemia)  - rosuvastatin (CRESTOR) 20 MG tablet; Take 1 tablet (20 mg total) by mouth at bedtime.  Dispense: 90 tablet; Refill: 3  5. Allergic rhinitis, unspecified allergic rhinitis type Get allergy shots weekly, has showed significant improvement.She thinks this has helped her a lot.   6. Gastroesophageal reflux disease, esophagitis presence not specified Improved but not resolved. Try Carafate and refer to GI.Hiatal hernia likely.  - sucralfate  (CARAFATE) 1 g tablet; Take 1 tablet (1 g total) by mouth 4 (four) times daily -  with meals and at bedtime.  Dispense: 120 tablet; Refill: 1 - Ambulatory referral to Gastroenterology 7. Knee pain/OA versus chondrocalcinosis Orthopedic referral at any point in time. For now will try chondroitin. Patient was seen and examined by Dr. Miguel Aschoff, and noted scribed by Webb Laws, [place] MD [facility] 08/08/2015 8:52 AM

## 2015-08-10 DIAGNOSIS — G4730 Sleep apnea, unspecified: Secondary | ICD-10-CM | POA: Diagnosis not present

## 2015-08-11 DIAGNOSIS — J3089 Other allergic rhinitis: Secondary | ICD-10-CM | POA: Diagnosis not present

## 2015-08-11 DIAGNOSIS — J301 Allergic rhinitis due to pollen: Secondary | ICD-10-CM | POA: Diagnosis not present

## 2015-08-16 DIAGNOSIS — J3089 Other allergic rhinitis: Secondary | ICD-10-CM | POA: Diagnosis not present

## 2015-08-16 DIAGNOSIS — J301 Allergic rhinitis due to pollen: Secondary | ICD-10-CM | POA: Diagnosis not present

## 2015-09-06 DIAGNOSIS — E781 Pure hyperglyceridemia: Secondary | ICD-10-CM | POA: Diagnosis not present

## 2015-09-06 DIAGNOSIS — E119 Type 2 diabetes mellitus without complications: Secondary | ICD-10-CM | POA: Diagnosis not present

## 2015-09-06 DIAGNOSIS — E785 Hyperlipidemia, unspecified: Secondary | ICD-10-CM | POA: Diagnosis not present

## 2015-09-06 DIAGNOSIS — J3089 Other allergic rhinitis: Secondary | ICD-10-CM | POA: Diagnosis not present

## 2015-09-06 DIAGNOSIS — J301 Allergic rhinitis due to pollen: Secondary | ICD-10-CM | POA: Diagnosis not present

## 2015-09-15 DIAGNOSIS — J301 Allergic rhinitis due to pollen: Secondary | ICD-10-CM | POA: Diagnosis not present

## 2015-09-15 DIAGNOSIS — J3089 Other allergic rhinitis: Secondary | ICD-10-CM | POA: Diagnosis not present

## 2015-09-20 DIAGNOSIS — E785 Hyperlipidemia, unspecified: Secondary | ICD-10-CM | POA: Diagnosis not present

## 2015-09-20 DIAGNOSIS — J3089 Other allergic rhinitis: Secondary | ICD-10-CM | POA: Diagnosis not present

## 2015-09-20 DIAGNOSIS — E538 Deficiency of other specified B group vitamins: Secondary | ICD-10-CM | POA: Diagnosis not present

## 2015-09-20 DIAGNOSIS — J301 Allergic rhinitis due to pollen: Secondary | ICD-10-CM | POA: Diagnosis not present

## 2015-09-20 DIAGNOSIS — E781 Pure hyperglyceridemia: Secondary | ICD-10-CM | POA: Diagnosis not present

## 2015-09-20 DIAGNOSIS — E119 Type 2 diabetes mellitus without complications: Secondary | ICD-10-CM | POA: Diagnosis not present

## 2015-09-27 DIAGNOSIS — J3089 Other allergic rhinitis: Secondary | ICD-10-CM | POA: Diagnosis not present

## 2015-09-27 DIAGNOSIS — J301 Allergic rhinitis due to pollen: Secondary | ICD-10-CM | POA: Diagnosis not present

## 2015-10-01 ENCOUNTER — Other Ambulatory Visit: Payer: Self-pay | Admitting: Family Medicine

## 2015-10-01 DIAGNOSIS — K219 Gastro-esophageal reflux disease without esophagitis: Secondary | ICD-10-CM

## 2015-10-04 DIAGNOSIS — J301 Allergic rhinitis due to pollen: Secondary | ICD-10-CM | POA: Diagnosis not present

## 2015-10-04 DIAGNOSIS — J3089 Other allergic rhinitis: Secondary | ICD-10-CM | POA: Diagnosis not present

## 2015-10-08 ENCOUNTER — Other Ambulatory Visit: Payer: Self-pay | Admitting: Family Medicine

## 2015-10-12 DIAGNOSIS — R142 Eructation: Secondary | ICD-10-CM | POA: Diagnosis not present

## 2015-10-12 DIAGNOSIS — K30 Functional dyspepsia: Secondary | ICD-10-CM | POA: Diagnosis not present

## 2015-10-12 DIAGNOSIS — K580 Irritable bowel syndrome with diarrhea: Secondary | ICD-10-CM | POA: Diagnosis not present

## 2015-10-13 DIAGNOSIS — J3089 Other allergic rhinitis: Secondary | ICD-10-CM | POA: Diagnosis not present

## 2015-10-13 DIAGNOSIS — J301 Allergic rhinitis due to pollen: Secondary | ICD-10-CM | POA: Diagnosis not present

## 2015-10-18 DIAGNOSIS — J3089 Other allergic rhinitis: Secondary | ICD-10-CM | POA: Diagnosis not present

## 2015-10-18 DIAGNOSIS — J301 Allergic rhinitis due to pollen: Secondary | ICD-10-CM | POA: Diagnosis not present

## 2015-10-21 DIAGNOSIS — J3089 Other allergic rhinitis: Secondary | ICD-10-CM | POA: Diagnosis not present

## 2015-11-01 DIAGNOSIS — J301 Allergic rhinitis due to pollen: Secondary | ICD-10-CM | POA: Diagnosis not present

## 2015-11-01 DIAGNOSIS — J3089 Other allergic rhinitis: Secondary | ICD-10-CM | POA: Diagnosis not present

## 2015-11-07 ENCOUNTER — Other Ambulatory Visit: Payer: Self-pay | Admitting: Family Medicine

## 2015-11-08 DIAGNOSIS — J301 Allergic rhinitis due to pollen: Secondary | ICD-10-CM | POA: Diagnosis not present

## 2015-11-08 DIAGNOSIS — J3089 Other allergic rhinitis: Secondary | ICD-10-CM | POA: Diagnosis not present

## 2015-11-14 DIAGNOSIS — Z23 Encounter for immunization: Secondary | ICD-10-CM | POA: Diagnosis not present

## 2015-11-15 DIAGNOSIS — J301 Allergic rhinitis due to pollen: Secondary | ICD-10-CM | POA: Diagnosis not present

## 2015-11-15 DIAGNOSIS — J3089 Other allergic rhinitis: Secondary | ICD-10-CM | POA: Diagnosis not present

## 2015-11-22 DIAGNOSIS — J301 Allergic rhinitis due to pollen: Secondary | ICD-10-CM | POA: Diagnosis not present

## 2015-11-22 DIAGNOSIS — Z01419 Encounter for gynecological examination (general) (routine) without abnormal findings: Secondary | ICD-10-CM | POA: Diagnosis not present

## 2015-11-22 DIAGNOSIS — J3089 Other allergic rhinitis: Secondary | ICD-10-CM | POA: Diagnosis not present

## 2015-11-24 ENCOUNTER — Other Ambulatory Visit: Payer: Self-pay | Admitting: Obstetrics and Gynecology

## 2015-11-24 DIAGNOSIS — Z1231 Encounter for screening mammogram for malignant neoplasm of breast: Secondary | ICD-10-CM

## 2015-11-29 DIAGNOSIS — J301 Allergic rhinitis due to pollen: Secondary | ICD-10-CM | POA: Diagnosis not present

## 2015-11-29 DIAGNOSIS — Z779 Other contact with and (suspected) exposures hazardous to health: Secondary | ICD-10-CM | POA: Diagnosis not present

## 2015-11-29 DIAGNOSIS — J3089 Other allergic rhinitis: Secondary | ICD-10-CM | POA: Diagnosis not present

## 2015-12-06 DIAGNOSIS — J301 Allergic rhinitis due to pollen: Secondary | ICD-10-CM | POA: Diagnosis not present

## 2015-12-06 DIAGNOSIS — J3089 Other allergic rhinitis: Secondary | ICD-10-CM | POA: Diagnosis not present

## 2015-12-09 ENCOUNTER — Encounter: Payer: Self-pay | Admitting: *Deleted

## 2015-12-12 ENCOUNTER — Ambulatory Visit: Payer: BLUE CROSS/BLUE SHIELD | Admitting: Certified Registered"

## 2015-12-12 ENCOUNTER — Encounter
Admission: RE | Disposition: A | Discharge status: Home / Self Care | Payer: Self-pay | Source: Ambulatory Visit | Attending: Unknown Physician Specialty

## 2015-12-12 ENCOUNTER — Ambulatory Visit
Admission: RE | Admit: 2015-12-12 | Discharge: 2015-12-12 | Disposition: A | Discharge status: Home / Self Care | Payer: BLUE CROSS/BLUE SHIELD | Source: Ambulatory Visit | Attending: Unknown Physician Specialty | Admitting: Unknown Physician Specialty

## 2015-12-12 DIAGNOSIS — I10 Essential (primary) hypertension: Secondary | ICD-10-CM | POA: Diagnosis not present

## 2015-12-12 DIAGNOSIS — R12 Heartburn: Secondary | ICD-10-CM | POA: Diagnosis present

## 2015-12-12 DIAGNOSIS — G4730 Sleep apnea, unspecified: Secondary | ICD-10-CM | POA: Diagnosis not present

## 2015-12-12 DIAGNOSIS — M1990 Unspecified osteoarthritis, unspecified site: Secondary | ICD-10-CM | POA: Insufficient documentation

## 2015-12-12 DIAGNOSIS — Z7982 Long term (current) use of aspirin: Secondary | ICD-10-CM | POA: Diagnosis not present

## 2015-12-12 DIAGNOSIS — E039 Hypothyroidism, unspecified: Secondary | ICD-10-CM | POA: Diagnosis not present

## 2015-12-12 DIAGNOSIS — K2950 Unspecified chronic gastritis without bleeding: Secondary | ICD-10-CM | POA: Insufficient documentation

## 2015-12-12 DIAGNOSIS — K210 Gastro-esophageal reflux disease with esophagitis: Secondary | ICD-10-CM | POA: Diagnosis not present

## 2015-12-12 DIAGNOSIS — Z7984 Long term (current) use of oral hypoglycemic drugs: Secondary | ICD-10-CM | POA: Insufficient documentation

## 2015-12-12 DIAGNOSIS — E119 Type 2 diabetes mellitus without complications: Secondary | ICD-10-CM | POA: Diagnosis not present

## 2015-12-12 DIAGNOSIS — K31819 Angiodysplasia of stomach and duodenum without bleeding: Secondary | ICD-10-CM | POA: Diagnosis not present

## 2015-12-12 DIAGNOSIS — K2960 Other gastritis without bleeding: Secondary | ICD-10-CM | POA: Diagnosis not present

## 2015-12-12 DIAGNOSIS — Q2733 Arteriovenous malformation of digestive system vessel: Secondary | ICD-10-CM | POA: Diagnosis not present

## 2015-12-12 DIAGNOSIS — E7800 Pure hypercholesterolemia, unspecified: Secondary | ICD-10-CM | POA: Diagnosis not present

## 2015-12-12 HISTORY — PX: ESOPHAGOGASTRODUODENOSCOPY (EGD) WITH PROPOFOL: SHX5813

## 2015-12-12 HISTORY — DX: Sleep apnea, unspecified: G47.30

## 2015-12-12 HISTORY — DX: Type 2 diabetes mellitus without complications: E11.9

## 2015-12-12 HISTORY — DX: Essential (primary) hypertension: I10

## 2015-12-12 HISTORY — DX: Pure hypercholesterolemia, unspecified: E78.00

## 2015-12-12 HISTORY — DX: Gastro-esophageal reflux disease without esophagitis: K21.9

## 2015-12-12 HISTORY — DX: Unspecified osteoarthritis, unspecified site: M19.90

## 2015-12-12 HISTORY — DX: Cardiac murmur, unspecified: R01.1

## 2015-12-12 LAB — SURGICAL PATHOLOGY

## 2015-12-12 LAB — GLUCOSE, CAPILLARY: Glucose-Capillary: 116 mg/dL — ABNORMAL HIGH (ref 65–99)

## 2015-12-12 SURGERY — ESOPHAGOGASTRODUODENOSCOPY (EGD) WITH PROPOFOL
Anesthesia: General

## 2015-12-12 MED ORDER — SODIUM CHLORIDE 0.9 % IV SOLN
INTRAVENOUS | Status: DC
  Administered 2015-12-12: 1000 mL via INTRAVENOUS

## 2015-12-12 MED ORDER — PROPOFOL 500 MG/50ML IV EMUL
INTRAVENOUS | Status: DC | PRN
  Administered 2015-12-12: 120 ug/kg/min via INTRAVENOUS

## 2015-12-12 MED ORDER — PROPOFOL 10 MG/ML IV BOLUS
INTRAVENOUS | Status: DC | PRN
  Administered 2015-12-12: 30 mg via INTRAVENOUS
  Administered 2015-12-12: 70 mg via INTRAVENOUS

## 2015-12-12 MED ORDER — MIDAZOLAM HCL 5 MG/5ML IJ SOLN
INTRAMUSCULAR | Status: DC | PRN
  Administered 2015-12-12: 1 mg via INTRAVENOUS

## 2015-12-12 MED ORDER — LIDOCAINE 2% (20 MG/ML) 5 ML SYRINGE
INTRAMUSCULAR | Status: DC | PRN
Start: 2015-12-12 — End: 2015-12-12
  Administered 2015-12-12: 50 mg via INTRAVENOUS

## 2015-12-12 MED ORDER — GLYCOPYRROLATE 0.2 MG/ML IJ SOLN
INTRAMUSCULAR | Status: DC | PRN
  Administered 2015-12-12: 0.2 mg via INTRAVENOUS

## 2015-12-12 MED ORDER — SODIUM CHLORIDE 0.9 % IV SOLN
INTRAVENOUS | Status: DC
Start: 2015-12-12 — End: 2015-12-12

## 2015-12-12 MED ORDER — FENTANYL CITRATE (PF) 100 MCG/2ML IJ SOLN
INTRAMUSCULAR | Status: DC | PRN
  Administered 2015-12-12: 50 ug via INTRAVENOUS

## 2015-12-12 NOTE — H&P (Signed)
Primary Care Physician:  Wilhemena Durie, MD Primary Gastroenterologist:  Dr. Vira Agar  Pre-Procedure History & Physical: HPI:  Cindy Hester is a 70 y.o. female is here for an endoscopy.   Past Medical History:  Diagnosis Date  . Arthritis   . Diabetes mellitus without complication ([place])   . GERD (gastroesophageal reflux disease)   . Heart murmur   . High cholesterol   . Hypertension   . Sleep apnea     Past Surgical History:  Procedure Laterality Date  . BREAST CYST EXCISION Left    removed two times  . CESAREAN SECTION  1986  . CYST EXCISION     from left breast  . HEEL SPUR SURGERY Right 2011  . NASAL SINUS SURGERY    . TONSILLECTOMY      Prior to Admission medications   Medication Sig Start Date End Date Taking? Authorizing Provider  amLODipine (NORVASC) 10 MG tablet take 1 tablet by mouth once daily 04/18/15  Yes Richard Maceo Pro., MD  aspirin 325 MG tablet Take 325 mg by mouth daily.     Yes Historical Provider, MD  calcium carbonate (OS-CAL) 600 MG TABS tablet Take by mouth.   Yes Historical Provider, MD  canagliflozin (INVOKANA) 300 MG TABS tablet Take 300 mg by mouth daily before breakfast.   Yes Historical Provider, MD  cholecalciferol (VITAMIN D) 1000 UNITS tablet Take 1,000 Units by mouth daily.     Yes Historical Provider, MD  diclofenac sodium (VOLTAREN) 1 % GEL Apply 2 g topically 4 (four) times daily.   Yes Historical Provider, MD  fenofibrate 160 MG tablet TAKE 1 BY MOUTH AT BEDTIME 06/01/15  Yes Richard Maceo Pro., MD  furosemide (LASIX) 40 MG tablet Take 40 mg by mouth daily. 10/30/10  Yes Historical Provider, MD  glucosamine-chondroitin (MAX GLUCOSAMINE CHONDROITIN) 500-400 MG tablet Take 1 tablet by mouth 3 (three) times daily. 04/19/15  Yes Richard Maceo Pro., MD  hydrALAZINE (APRESOLINE) 25 MG tablet take 1 tablet by mouth twice a day 10/11/15  Yes Richard Maceo Pro., MD  hyoscyamine (LEVSIN SL) 0.125 MG SL tablet Place 0.125 mg under the  tongue every 4 (four) hours as needed for cramping.   Yes Historical Provider, MD  Liraglutide (VICTOZA) 18 MG/3ML SOPN INJECT 1.8MG  UNDER THE SKIN ONCE DAILY AS DIRECTED 09/10/14  Yes Historical Provider, MD  losartan-hydrochlorothiazide (HYZAAR) 100-25 MG tablet take 1 tablet by mouth once daily 11/07/15  Yes Richard Maceo Pro., MD  metFORMIN (GLUCOPHAGE) 1000 MG tablet Take 1,000 mg by mouth 2 (two) times daily with a meal.    Yes Historical Provider, MD  metoprolol (TOPROL-XL) 200 MG 24 hr tablet take 1 tablet by mouth once daily 11/07/15  Yes Richard Maceo Pro., MD  montelukast (SINGULAIR) 10 MG tablet Take 1 tablet (10 mg total) by mouth daily. 12/29/14  Yes Richard Maceo Pro., MD  Multiple Vitamin (MULTIVITAMIN) tablet Take 1 tablet by mouth daily.     Yes Historical Provider, MD  naproxen (NAPROSYN) 500 MG tablet Take 500 mg by mouth 2 (two) times daily as needed.   Yes Historical Provider, MD  nateglinide (STARLIX) 120 MG tablet Take 120 mg by mouth 3 (three) times daily with meals.   Yes Historical Provider, MD  omeprazole (PRILOSEC) 20 MG capsule Take 20 mg by mouth daily.   Yes Historical Provider, MD  pantoprazole (PROTONIX) 40 MG tablet Take 1 tablet (40 mg total) by mouth every  morning. 04/19/15  Yes Richard Maceo Pro., MD  phentermine 30 MG capsule Take 30 mg by mouth every morning.   Yes Historical Provider, MD  pioglitazone (ACTOS) 15 MG tablet Take 15 mg by mouth daily. Patient takes every other day right now per endocrinologist.   Yes Historical Provider, MD  ranitidine (ZANTAC) 150 MG tablet Take 1 tablet (150 mg total) by mouth at bedtime. 04/19/15  Yes Richard Maceo Pro., MD  rosuvastatin (CRESTOR) 20 MG tablet Take 1 tablet (20 mg total) by mouth at bedtime. 08/08/15  Yes Richard Maceo Pro., MD  simethicone (MYLICON) 80 MG chewable tablet Chew 80 mg by mouth every 6 (six) hours as needed for flatulence.   Yes Historical Provider, MD  sucralfate (CARAFATE) 1 g tablet  take 1 tablet by mouth four times a day WITH MEALS AND AT BEDTIME 10/03/15  Yes Richard Maceo Pro., MD  valACYclovir (VALTREX) 500 MG tablet take 1 tablet by mouth twice a day if needed 10/26/14  Yes Richard Maceo Pro., MD  vitamin B-12 (CYANOCOBALAMIN) 1000 MCG tablet Take 1,000 mcg by mouth daily.   Yes Historical Provider, MD  Multiple Vitamins-Minerals (HAIR SKIN AND NAILS FORMULA PO) Take by mouth.    Historical Provider, MD    Allergies as of 10/24/2015  . (No Known Allergies)    Family History  Problem Relation Age of Onset  . Stroke Mother   . Diabetes Mother   . Hypertension Mother   . Congestive Heart Failure Father   . Hypertension Father   . CAD Father   . Glaucoma Father   . Hyperlipidemia Brother   . Breast cancer Paternal Aunt   . Breast cancer Paternal Aunt     Social History   Social History  . Marital status: Single    Spouse name: N/A  . Number of children: N/A  . Years of education: N/A   Occupational History  . Not on file.   Social History Main Topics  . Smoking status: Never Smoker  . Smokeless tobacco: Never Used  . Alcohol use Yes     Comment: maybe 1 drink once a month  . Drug use: No  . Sexual activity: Not on file   Other Topics Concern  . Not on file   Social History Narrative  . No narrative on file    Review of Systems: See HPI, otherwise negative ROS  Physical Exam: BP (!) 181/64   Pulse 68   Temp 97.1 F (36.2 C) (Tympanic)   Resp 16   Ht 5\' 7"  (1.702 m)   Wt 86.2 kg (190 lb)   SpO2 99%   BMI 29.76 kg/m  General:   Alert,  pleasant and cooperative in NAD Head:  Normocephalic and atraumatic. Neck:  Supple; no masses or thyromegaly. Lungs:  Clear throughout to auscultation.    Heart:  Regular rate and rhythm. Abdomen:  Soft, nontender and nondistended. Normal bowel sounds, without guarding, and without rebound.   Neurologic:  Alert and  oriented x4;  grossly normal neurologically.  Impression/Plan: Cindy Hester is here for an endoscopy to be performed for chronic heartburn and reflux  Risks, benefits, limitations, and alternatives regarding  endoscopy have been reviewed with the patient.  Questions have been answered.  All parties agreeable.   Gaylyn Cheers, MD  12/12/2015, 8:18 AM

## 2015-12-12 NOTE — Op Note (Signed)
[facility] Gastroenterology Patient Name: Cindy Hester Procedure Date: 12/12/2015 8:14 AM MRN: [id number] Account #: [id number] Date of Birth: 05/26/1945 Admit Type: Outpatient Age: 70 Room: [facility] ENDO ROOM 4 Gender: Female Note Status: Finalized Procedure:            Upper GI endoscopy Indications:          Heartburn Providers:            Manya Silvas, MD Referring MD:         Janine Ores. Rosanna Randy, MD (Referring MD) Medicines:            Propofol per Anesthesia Complications:        No immediate complications. Procedure:            Pre-Anesthesia Assessment:                       - After reviewing the risks and benefits, the patient                        was deemed in satisfactory condition to undergo the                        procedure.                       After obtaining informed consent, the endoscope was                        passed under direct vision. Throughout the procedure,                        the patient's blood pressure, pulse, and oxygen                        saturations were monitored continuously. The Endoscope                        was introduced through the mouth, and advanced to the                        second part of duodenum. The upper GI endoscopy was                        accomplished without difficulty. The patient tolerated                        the procedure well. Findings:      LA Grade A (one or more mucosal breaks less than 5 mm, not extending       between tops of 2 mucosal folds) esophagitis with no bleeding was found       40 cm from the incisors. Biopsies were taken with a cold forceps for       histology.      Patchy mild inflammation characterized by erythema and granularity was       found in the gastric antrum. Biopsies were taken with a cold forceps for       histology. Biopsies were taken with a cold forceps for Helicobacter       pylori testing.      A single small no bleeding angioectasia was found on the  lesser  curvature of the gastric body. Coagulation for tissue destruction using       argon plasma at 0.8 liters/minute and 20 watts was successful.      The examined duodenum was normal. Impression:           - LA Grade A reflux esophagitis. Rule out Barrett's                        esophagus. Biopsied.                       - Gastritis. Biopsied.                       - A single non-bleeding angioectasia in the stomach.                        Treated with argon plasma coagulation (APC).                       - Normal examined duodenum. Recommendation:       - Await pathology results. Manya Silvas, MD 12/12/2015 8:39:10 AM This report has been signed electronically. Number of Addenda: 0 Note Initiated On: 12/12/2015 8:14 AM      [facility]

## 2015-12-12 NOTE — Anesthesia Preprocedure Evaluation (Signed)
Anesthesia Evaluation  Patient identified by MRN, date of birth, ID band Patient awake    Reviewed: Allergy & Precautions, NPO status , Patient's Chart, lab work & pertinent test results  History of Anesthesia Complications Negative for: history of anesthetic complications  Airway Mallampati: III       Dental   Pulmonary sleep apnea and Continuous Positive Airway Pressure Ventilation ,           Cardiovascular hypertension, Pt. on medications and Pt. on home beta blockers + Valvular Problems/Murmurs (functional;, no tx)      Neuro/Psych    GI/Hepatic GERD  Medicated and Poorly Controlled,  Endo/Other  diabetes, Type 2, Oral Hypoglycemic AgentsHypothyroidism   Renal/GU      Musculoskeletal  (+) Arthritis ,   Abdominal   Peds  Hematology   Anesthesia Other Findings   Reproductive/Obstetrics                             Anesthesia Physical Anesthesia Plan  ASA: III  Anesthesia Plan: General   Post-op Pain Management:    Induction: Intravenous  Airway Management Planned:   Additional Equipment:   Intra-op Plan:   Post-operative Plan:   Informed Consent: I have reviewed the patients History and Physical, chart, labs and discussed the procedure including the risks, benefits and alternatives for the proposed anesthesia with the patient or authorized representative who has indicated his/her understanding and acceptance.     Plan Discussed with:   Anesthesia Plan Comments:         Anesthesia Quick Evaluation

## 2015-12-12 NOTE — Anesthesia Postprocedure Evaluation (Signed)
Anesthesia Post Note  Patient: Cindy Hester  Procedure(s) Performed: Procedure(s) (LRB): ESOPHAGOGASTRODUODENOSCOPY (EGD) WITH PROPOFOL (N/A)  Patient location during evaluation: Endoscopy Anesthesia Type: General Level of consciousness: awake and alert Pain management: pain level controlled Vital Signs Assessment: post-procedure vital signs reviewed and stable Respiratory status: spontaneous breathing and respiratory function stable Cardiovascular status: stable Anesthetic complications: no    Last Vitals:  Vitals:   12/12/15 0837 12/12/15 0838  BP: (!) 121/59 (!) 121/59  Pulse: 70 70  Resp: (!) 23 20  Temp: 36.2 C 36.2 C    Last Pain:  Vitals:   12/12/15 0837  TempSrc: Tympanic                 [name] K

## 2015-12-12 NOTE — Transfer of Care (Signed)
Immediate Anesthesia Transfer of Care Note  Patient: Cindy Hester  Procedure(s) Performed: Procedure(s) with comments: ESOPHAGOGASTRODUODENOSCOPY (EGD) WITH PROPOFOL (N/A) - Diabetic  Patient Location: Endoscopy Unit  Anesthesia Type:General  Level of Consciousness: awake  Airway & Oxygen Therapy: Patient Spontanous Breathing and Patient connected to nasal cannula oxygen  Post-op Assessment: Report given to RN and Post -op Vital signs reviewed and stable  Post vital signs: Reviewed  Last Vitals:  Vitals:   12/12/15 0745 12/12/15 0838  BP: (!) 181/64 (!) 121/59  Pulse: 68 70  Resp: 16 20  Temp: 36.2 C 36.2 C    Last Pain:  Vitals:   12/12/15 0745  TempSrc: Tympanic         Complications: No apparent anesthesia complications

## 2015-12-13 ENCOUNTER — Encounter: Payer: Self-pay | Admitting: Unknown Physician Specialty

## 2015-12-13 DIAGNOSIS — J301 Allergic rhinitis due to pollen: Secondary | ICD-10-CM | POA: Diagnosis not present

## 2015-12-13 DIAGNOSIS — J3089 Other allergic rhinitis: Secondary | ICD-10-CM | POA: Diagnosis not present

## 2015-12-20 DIAGNOSIS — J3089 Other allergic rhinitis: Secondary | ICD-10-CM | POA: Diagnosis not present

## 2015-12-20 DIAGNOSIS — J301 Allergic rhinitis due to pollen: Secondary | ICD-10-CM | POA: Diagnosis not present

## 2015-12-27 DIAGNOSIS — J301 Allergic rhinitis due to pollen: Secondary | ICD-10-CM | POA: Diagnosis not present

## 2015-12-27 DIAGNOSIS — E119 Type 2 diabetes mellitus without complications: Secondary | ICD-10-CM | POA: Diagnosis not present

## 2015-12-27 DIAGNOSIS — J3089 Other allergic rhinitis: Secondary | ICD-10-CM | POA: Diagnosis not present

## 2016-01-03 DIAGNOSIS — J301 Allergic rhinitis due to pollen: Secondary | ICD-10-CM | POA: Diagnosis not present

## 2016-01-03 DIAGNOSIS — J3089 Other allergic rhinitis: Secondary | ICD-10-CM | POA: Diagnosis not present

## 2016-01-09 ENCOUNTER — Ambulatory Visit: Payer: BLUE CROSS/BLUE SHIELD | Admitting: Family Medicine

## 2016-01-10 DIAGNOSIS — J3089 Other allergic rhinitis: Secondary | ICD-10-CM | POA: Diagnosis not present

## 2016-01-10 DIAGNOSIS — J301 Allergic rhinitis due to pollen: Secondary | ICD-10-CM | POA: Diagnosis not present

## 2016-01-17 DIAGNOSIS — J301 Allergic rhinitis due to pollen: Secondary | ICD-10-CM | POA: Diagnosis not present

## 2016-01-17 DIAGNOSIS — J3089 Other allergic rhinitis: Secondary | ICD-10-CM | POA: Diagnosis not present

## 2016-01-18 ENCOUNTER — Other Ambulatory Visit: Payer: Self-pay

## 2016-01-18 MED ORDER — MONTELUKAST SODIUM 10 MG PO TABS: 10.0000 mg | ORAL_TABLET | Freq: Every day | ORAL | 3 refills | Status: DC

## 2016-01-24 DIAGNOSIS — J3089 Other allergic rhinitis: Secondary | ICD-10-CM | POA: Diagnosis not present

## 2016-01-24 DIAGNOSIS — J301 Allergic rhinitis due to pollen: Secondary | ICD-10-CM | POA: Diagnosis not present

## 2016-01-26 DIAGNOSIS — M1812 Unilateral primary osteoarthritis of first carpometacarpal joint, left hand: Secondary | ICD-10-CM | POA: Diagnosis not present

## 2016-01-26 DIAGNOSIS — M25532 Pain in left wrist: Secondary | ICD-10-CM | POA: Diagnosis not present

## 2016-01-26 DIAGNOSIS — M654 Radial styloid tenosynovitis [de Quervain]: Secondary | ICD-10-CM | POA: Diagnosis not present

## 2016-02-02 DIAGNOSIS — J301 Allergic rhinitis due to pollen: Secondary | ICD-10-CM | POA: Diagnosis not present

## 2016-02-02 DIAGNOSIS — J3089 Other allergic rhinitis: Secondary | ICD-10-CM | POA: Diagnosis not present

## 2016-02-07 DIAGNOSIS — J301 Allergic rhinitis due to pollen: Secondary | ICD-10-CM | POA: Diagnosis not present

## 2016-02-07 DIAGNOSIS — J3089 Other allergic rhinitis: Secondary | ICD-10-CM | POA: Diagnosis not present

## 2016-02-13 DIAGNOSIS — K210 Gastro-esophageal reflux disease with esophagitis: Secondary | ICD-10-CM | POA: Diagnosis not present

## 2016-02-13 DIAGNOSIS — K2970 Gastritis, unspecified, without bleeding: Secondary | ICD-10-CM | POA: Diagnosis not present

## 2016-02-13 DIAGNOSIS — K529 Noninfective gastroenteritis and colitis, unspecified: Secondary | ICD-10-CM | POA: Diagnosis not present

## 2016-02-14 DIAGNOSIS — J3089 Other allergic rhinitis: Secondary | ICD-10-CM | POA: Diagnosis not present

## 2016-02-14 DIAGNOSIS — J301 Allergic rhinitis due to pollen: Secondary | ICD-10-CM | POA: Diagnosis not present

## 2016-02-21 DIAGNOSIS — J3089 Other allergic rhinitis: Secondary | ICD-10-CM | POA: Diagnosis not present

## 2016-02-21 DIAGNOSIS — J301 Allergic rhinitis due to pollen: Secondary | ICD-10-CM | POA: Diagnosis not present

## 2016-02-26 ENCOUNTER — Other Ambulatory Visit: Payer: Self-pay | Admitting: Family Medicine

## 2016-02-28 DIAGNOSIS — J301 Allergic rhinitis due to pollen: Secondary | ICD-10-CM | POA: Diagnosis not present

## 2016-02-28 DIAGNOSIS — J3089 Other allergic rhinitis: Secondary | ICD-10-CM | POA: Diagnosis not present

## 2016-03-06 DIAGNOSIS — J3089 Other allergic rhinitis: Secondary | ICD-10-CM | POA: Diagnosis not present

## 2016-03-06 DIAGNOSIS — J301 Allergic rhinitis due to pollen: Secondary | ICD-10-CM | POA: Diagnosis not present

## 2016-03-06 DIAGNOSIS — G4730 Sleep apnea, unspecified: Secondary | ICD-10-CM | POA: Diagnosis not present

## 2016-03-08 DIAGNOSIS — M1812 Unilateral primary osteoarthritis of first carpometacarpal joint, left hand: Secondary | ICD-10-CM | POA: Diagnosis not present

## 2016-03-08 DIAGNOSIS — M654 Radial styloid tenosynovitis [de Quervain]: Secondary | ICD-10-CM | POA: Diagnosis not present

## 2016-03-08 DIAGNOSIS — G4730 Sleep apnea, unspecified: Secondary | ICD-10-CM | POA: Diagnosis not present

## 2016-03-08 DIAGNOSIS — M25532 Pain in left wrist: Secondary | ICD-10-CM | POA: Diagnosis not present

## 2016-03-09 DIAGNOSIS — G4730 Sleep apnea, unspecified: Secondary | ICD-10-CM | POA: Diagnosis not present

## 2016-03-13 DIAGNOSIS — J301 Allergic rhinitis due to pollen: Secondary | ICD-10-CM | POA: Diagnosis not present

## 2016-03-13 DIAGNOSIS — J3089 Other allergic rhinitis: Secondary | ICD-10-CM | POA: Diagnosis not present

## 2016-03-14 DIAGNOSIS — E538 Deficiency of other specified B group vitamins: Secondary | ICD-10-CM | POA: Diagnosis not present

## 2016-03-14 DIAGNOSIS — E119 Type 2 diabetes mellitus without complications: Secondary | ICD-10-CM | POA: Diagnosis not present

## 2016-03-14 DIAGNOSIS — E781 Pure hyperglyceridemia: Secondary | ICD-10-CM | POA: Diagnosis not present

## 2016-03-14 DIAGNOSIS — E785 Hyperlipidemia, unspecified: Secondary | ICD-10-CM | POA: Diagnosis not present

## 2016-03-14 LAB — TSH: TSH: 1.11 u[IU]/mL (ref <=5.90)

## 2016-03-14 LAB — HEMOGLOBIN A1C: Hemoglobin A1C: 6.7

## 2016-03-14 LAB — VITAMIN B12: Vitamin B-12: 1147

## 2016-03-14 LAB — LIPID PANEL
CHOLESTEROL: 151 mg/dL (ref 0–200)
HDL: 64 mg/dL (ref 35–70)
LDL CALC: 65 mg/dL
Triglycerides: 112 mg/dL (ref 40–160)

## 2016-03-20 DIAGNOSIS — E781 Pure hyperglyceridemia: Secondary | ICD-10-CM | POA: Diagnosis not present

## 2016-03-20 DIAGNOSIS — E119 Type 2 diabetes mellitus without complications: Secondary | ICD-10-CM | POA: Diagnosis not present

## 2016-03-20 DIAGNOSIS — E538 Deficiency of other specified B group vitamins: Secondary | ICD-10-CM | POA: Diagnosis not present

## 2016-03-20 DIAGNOSIS — J301 Allergic rhinitis due to pollen: Secondary | ICD-10-CM | POA: Diagnosis not present

## 2016-03-20 DIAGNOSIS — J3089 Other allergic rhinitis: Secondary | ICD-10-CM | POA: Diagnosis not present

## 2016-03-20 DIAGNOSIS — E785 Hyperlipidemia, unspecified: Secondary | ICD-10-CM | POA: Diagnosis not present

## 2016-03-27 DIAGNOSIS — J3089 Other allergic rhinitis: Secondary | ICD-10-CM | POA: Diagnosis not present

## 2016-03-27 DIAGNOSIS — J301 Allergic rhinitis due to pollen: Secondary | ICD-10-CM | POA: Diagnosis not present

## 2016-03-28 DIAGNOSIS — E119 Type 2 diabetes mellitus without complications: Secondary | ICD-10-CM | POA: Diagnosis not present

## 2016-04-03 DIAGNOSIS — J3089 Other allergic rhinitis: Secondary | ICD-10-CM | POA: Diagnosis not present

## 2016-04-03 DIAGNOSIS — J301 Allergic rhinitis due to pollen: Secondary | ICD-10-CM | POA: Diagnosis not present

## 2016-04-06 ENCOUNTER — Ambulatory Visit
Admission: RE | Admit: 2016-04-06 | Discharge: 2016-04-06 | Disposition: A | Discharge status: Home / Self Care | Payer: BLUE CROSS/BLUE SHIELD | Source: Ambulatory Visit | Attending: Obstetrics and Gynecology | Admitting: Obstetrics and Gynecology

## 2016-04-06 DIAGNOSIS — M79672 Pain in left foot: Secondary | ICD-10-CM | POA: Diagnosis not present

## 2016-04-06 DIAGNOSIS — M19072 Primary osteoarthritis, left ankle and foot: Secondary | ICD-10-CM | POA: Diagnosis not present

## 2016-04-06 DIAGNOSIS — Z1231 Encounter for screening mammogram for malignant neoplasm of breast: Secondary | ICD-10-CM | POA: Diagnosis not present

## 2016-04-09 DIAGNOSIS — J3089 Other allergic rhinitis: Secondary | ICD-10-CM | POA: Diagnosis not present

## 2016-04-09 DIAGNOSIS — J301 Allergic rhinitis due to pollen: Secondary | ICD-10-CM | POA: Diagnosis not present

## 2016-04-10 DIAGNOSIS — J3089 Other allergic rhinitis: Secondary | ICD-10-CM | POA: Diagnosis not present

## 2016-04-10 DIAGNOSIS — J301 Allergic rhinitis due to pollen: Secondary | ICD-10-CM | POA: Diagnosis not present

## 2016-04-13 DIAGNOSIS — R140 Abdominal distension (gaseous): Secondary | ICD-10-CM | POA: Diagnosis not present

## 2016-04-13 DIAGNOSIS — A049 Bacterial intestinal infection, unspecified: Secondary | ICD-10-CM | POA: Diagnosis not present

## 2016-04-19 DIAGNOSIS — J3089 Other allergic rhinitis: Secondary | ICD-10-CM | POA: Diagnosis not present

## 2016-04-19 DIAGNOSIS — J301 Allergic rhinitis due to pollen: Secondary | ICD-10-CM | POA: Diagnosis not present

## 2016-04-24 DIAGNOSIS — M19071 Primary osteoarthritis, right ankle and foot: Secondary | ICD-10-CM | POA: Diagnosis not present

## 2016-04-24 DIAGNOSIS — M2041 Other hammer toe(s) (acquired), right foot: Secondary | ICD-10-CM | POA: Diagnosis not present

## 2016-04-24 DIAGNOSIS — M19072 Primary osteoarthritis, left ankle and foot: Secondary | ICD-10-CM | POA: Diagnosis not present

## 2016-04-24 DIAGNOSIS — J3089 Other allergic rhinitis: Secondary | ICD-10-CM | POA: Diagnosis not present

## 2016-04-24 DIAGNOSIS — J301 Allergic rhinitis due to pollen: Secondary | ICD-10-CM | POA: Diagnosis not present

## 2016-04-25 ENCOUNTER — Encounter: Payer: Self-pay | Admitting: Family Medicine

## 2016-04-25 ENCOUNTER — Other Ambulatory Visit: Payer: Self-pay | Admitting: Family Medicine

## 2016-04-25 ENCOUNTER — Ambulatory Visit (INDEPENDENT_AMBULATORY_CARE_PROVIDER_SITE_OTHER): Payer: BLUE CROSS/BLUE SHIELD | Admitting: Family Medicine

## 2016-04-25 VITALS — BP 118/60 | HR 76 | Temp 98.0°F | Resp 16 | Ht 67.0 in | Wt 196.0 lb

## 2016-04-25 DIAGNOSIS — Z0000 Encounter for general adult medical examination without abnormal findings: Secondary | ICD-10-CM | POA: Diagnosis not present

## 2016-04-25 DIAGNOSIS — Z794 Long term (current) use of insulin: Secondary | ICD-10-CM

## 2016-04-25 DIAGNOSIS — K1379 Other lesions of oral mucosa: Secondary | ICD-10-CM

## 2016-04-25 DIAGNOSIS — L989 Disorder of the skin and subcutaneous tissue, unspecified: Secondary | ICD-10-CM

## 2016-04-25 DIAGNOSIS — L659 Nonscarring hair loss, unspecified: Secondary | ICD-10-CM | POA: Diagnosis not present

## 2016-04-25 DIAGNOSIS — M2041 Other hammer toe(s) (acquired), right foot: Secondary | ICD-10-CM

## 2016-04-25 DIAGNOSIS — E118 Type 2 diabetes mellitus with unspecified complications: Secondary | ICD-10-CM

## 2016-04-25 NOTE — Patient Instructions (Addendum)
Try OTC Lysine (supplement) for mouth sores. If you do not notice relief of symptoms, you will need to see GI for mouth sores.

## 2016-04-25 NOTE — Progress Notes (Signed)
Patient: Cindy Hester, Female    DOB: 03-Oct-1945, 71 y.o.   MRN: [id number] Visit Date: 04/25/2016  Today's Provider: Wilhemena Durie, MD   Chief Complaint  Patient presents with  . Annual Exam   Subjective:    Annual physical exam Cindy Hester is a 71 y.o. female who presents today for health maintenance and complete physical. She feels well. She reports exercising never. She reports she is sleeping well. She has multiple issues she wishes to discuss today--see ROS.  Last colonoscopy- 01/15/2011- WNL. Repeat 10 years Last mammogram- 04/06/2016- Negative. F/B gynecology. Pt received letter stating pt has dense breast tissue, and is concerned about what that means. Pt had labs done through Dr. Eddie Dibbles at [facility] on 03/14/2016. Labs done include HgbA1C, lipid panel, B12 level, TSH. Pt is due for CMP and CBC. ----------------------------------------------------------------- Mouth Sores Pt has a H/O GI distress that includes belching, diarrhea. Pt had a bacterial overgrowth HBT that was performed through [facility] on 04/13/2016, that was positive. Pt is concerned that the sores are secondary to the GI issues. Pt has tried Valtrex for this, without relief.  Foot Pain Pt has an upcoming podiatry surgery. Pt is having surgery on toes of right foot. Performed by Dr. Vickki Muff. Pt needs surgical clearance letter. This will be at the end of April.  Review of Systems  Constitutional: Negative.   HENT: Positive for mouth sores and rhinorrhea. Negative for congestion, dental problem, drooling, ear discharge, ear pain, facial swelling, hearing loss, nosebleeds, postnasal drip, sinus pain, sinus pressure, sneezing, sore throat, tinnitus, trouble swallowing and voice change.   Eyes: Negative.   Respiratory: Positive for apnea. Negative for cough, choking, chest tightness, shortness of breath, wheezing and stridor.   Cardiovascular: Negative.   Gastrointestinal: Positive for abdominal pain (Pt had  bacterial overgrowth HBT performed through [facility] on 04/13/2016, which was positive.) and diarrhea (IBS?). Negative for abdominal distention, anal bleeding, blood in stool, constipation, nausea, rectal pain and vomiting.  Endocrine: Negative for cold intolerance, heat intolerance, polydipsia, polyphagia and polyuria.       Thinning hair and nail changes  Genitourinary: Negative.   Musculoskeletal: Positive for arthralgias. Negative for back pain, gait problem, joint swelling, myalgias, neck pain and neck stiffness.  Skin: Negative for color change, pallor, rash and wound.       Dry scaly patch on left upper leg  Allergic/Immunologic: Positive for environmental allergies (allergy shots once weekly). Negative for food allergies and immunocompromised state.  Neurological: Negative.   Hematological: Negative.   Psychiatric/Behavioral: Negative.     Social History      She  reports that she has never smoked. She has never used smokeless tobacco. She reports that she drinks alcohol. She reports that she does not use drugs.       Social History   Social History  . Marital status: Single    Spouse name: N/A  . Number of children: 1  . Years of education: master's   Occupational History  .  [employer]   Social History Main Topics  . Smoking status: Never Smoker  . Smokeless tobacco: Never Used  . Alcohol use Yes     Comment: maybe 1 drink once a month  . Drug use: No  . Sexual activity: Not Asked   Other Topics Concern  . None   Social History Narrative  . None    Past Medical History:  Diagnosis Date  .  Arthritis   . Diabetes mellitus without complication ([place])   . GERD (gastroesophageal reflux disease)   . Heart murmur   . High cholesterol   . Hypertension   . Sleep apnea      Patient Active Problem List   Diagnosis Date Noted  . Allergic rhinitis 07/02/2014  . Diabetes ([place]) 07/02/2014  . Genital herpes 07/02/2014  . HLD (hyperlipidemia)  07/02/2014  . Adult hypothyroidism 07/02/2014  . Arthritis, degenerative 07/02/2014  . Adiposity 07/02/2014  . Obstructive apnea 07/02/2014  . Avitaminosis D 07/02/2014  . Tendonitis, deQuervains 11/07/2010  . Mucous cyst of toe 11/07/2010  . MEDIAL MENISCUS TEAR, LEFT 06/29/2008  . KNEE PAIN, LEFT 06/01/2008  . TRIGGER FINGER, THUMB 04/20/2008  . AODM 02/03/2008  . ESSENTIAL HYPERTENSION, BENIGN 02/03/2008  . ACHILLES BURSITIS OR TENDINITIS 02/03/2008  . 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 TALIPES CAVUS 02/03/2008    Past Surgical History:  Procedure Laterality Date  . BREAST CYST EXCISION Left    removed two times  . CESAREAN SECTION  1986  . CYST EXCISION     from left breast  . ESOPHAGOGASTRODUODENOSCOPY (EGD) WITH PROPOFOL N/A 12/12/2015   gastritis, LA Grade A reflux esophagitis ESOPHAGOGASTRODUODENOSCOPY (EGD) WITH PROPOFOL;  Surgeon: Manya Silvas, MD;  Location: [facility];  Service: Endoscopy;  Laterality: N/A;  Diabetic  . HEEL SPUR SURGERY Right 2011  . NASAL SINUS SURGERY    . TONSILLECTOMY      Family History        Family Status  Relation Status  . Mother Deceased at age 15  . Father Deceased at age 59  . Brother Alive  . Paternal Aunt   . Paternal Aunt         Her family history includes Breast cancer in her paternal aunt and paternal aunt; CAD in her father; Congestive Heart Failure in her father; Diabetes in her mother; Glaucoma in her father; Hyperlipidemia in her brother; Hypertension in her father and mother; Stroke in her mother.     No Known Allergies   Current Outpatient Prescriptions:  .  amLODipine (NORVASC) 10 MG tablet, take 1 tablet by mouth once daily, Disp: 30 tablet, Rfl: 12 .  aspirin 325 MG tablet, Take 325 mg by mouth daily.  , Disp: , Rfl:  .  calcium carbonate (OS-CAL) 600 MG TABS tablet, Take by mouth., Disp: , Rfl:  .  canagliflozin (INVOKANA) 300 MG TABS tablet, Take 300 mg by mouth daily before breakfast., Disp: , Rfl:    .  cholecalciferol (VITAMIN D) 1000 UNITS tablet, Take 1,000 Units by mouth once a week. , Disp: , Rfl:  .  furosemide (LASIX) 40 MG tablet, Take 40 mg by mouth daily., Disp: , Rfl:  .  glucosamine-chondroitin (MAX GLUCOSAMINE CHONDROITIN) 500-400 MG tablet, Take 1 tablet by mouth 3 (three) times daily., Disp: 30 tablet, Rfl: 0 .  hydrALAZINE (APRESOLINE) 25 MG tablet, take 1 tablet by mouth twice a day, Disp: 60 tablet, Rfl: 12 .  Liraglutide (VICTOZA) 18 MG/3ML SOPN, INJECT 1.8MG  UNDER THE SKIN ONCE DAILY AS DIRECTED, Disp: , Rfl:  .  losartan-hydrochlorothiazide (HYZAAR) 100-25 MG tablet, take 1 tablet by mouth once daily, Disp: 30 tablet, Rfl: 12 .  metFORMIN (GLUCOPHAGE) 1000 MG tablet, Take 1,000 mg by mouth 2 (two) times daily with a meal. , Disp: , Rfl:  .  metoprolol (TOPROL-XL) 200 MG 24 hr tablet, take 1 tablet by mouth once daily, Disp: 30 tablet, Rfl: 12 .  montelukast (SINGULAIR) 10 MG tablet, Take 1 tablet (10 mg total) by mouth daily., Disp: 90 tablet, Rfl: 3 .  Multiple Vitamin (MULTIVITAMIN) tablet, Take 1 tablet by mouth daily.  , Disp: , Rfl:  .  Multiple Vitamins-Minerals (HAIR SKIN AND NAILS FORMULA PO), Take by mouth., Disp: , Rfl:  .  naproxen (NAPROSYN) 500 MG tablet, Take 500 mg by mouth 2 (two) times daily as needed., Disp: , Rfl:  .  pantoprazole (PROTONIX) 40 MG tablet, Take 1 tablet (40 mg total) by mouth every morning., Disp: 30 tablet, Rfl: 12 .  phentermine 30 MG capsule, Take 30 mg by mouth every morning., Disp: , Rfl:  .  pioglitazone (ACTOS) 15 MG tablet, Take 15 mg by mouth daily. Patient takes every other day right now per endocrinologist., Disp: , Rfl:  .  ranitidine (ZANTAC) 150 MG tablet, Take 1 tablet (150 mg total) by mouth at bedtime., Disp: 30 tablet, Rfl: 12 .  rosuvastatin (CRESTOR) 20 MG tablet, Take 1 tablet (20 mg total) by mouth at bedtime., Disp: 90 tablet, Rfl: 3 .  valACYclovir (VALTREX) 500 MG tablet, take 1 tablet by mouth twice a day if  needed, Disp: 60 tablet, Rfl: 3 .  vitamin B-12 (CYANOCOBALAMIN) 1000 MCG tablet, Take 1,000 mcg by mouth daily., Disp: , Rfl:    Patient Care Team: Jerrol Banana., MD as PCP - General (Family Medicine)      Objective:   Vitals: BP 118/60 (BP Location: Right Arm, Patient Position: Sitting, Cuff Size: Large)   Pulse 76   Temp 98 F (36.7 C) (Oral)   Resp 16   Ht 5\' 7"  (1.702 m)   Wt 196 lb (88.9 kg)   BMI 30.70 kg/m    Vitals:   04/25/16 0850  BP: 118/60  Pulse: 76  Resp: 16  Temp: 98 F (36.7 C)  TempSrc: Oral  Weight: 196 lb (88.9 kg)  Height: 5\' 7"  (1.702 m)     Physical Exam  Constitutional: She is oriented to person, place, and time. She appears well-developed and well-nourished.  HENT:  Head: Normocephalic and atraumatic.  Right Ear: Tympanic membrane, external ear and ear canal normal.  Left Ear: Tympanic membrane, external ear and ear canal normal.  Nose: Nose normal.  Mouth/Throat: Uvula is midline, oropharynx is clear and moist and mucous membranes are normal.  Eyes: Conjunctivae, EOM and lids are normal. Pupils are equal, round, and reactive to light.  Neck: Trachea normal and normal range of motion. Neck supple. Carotid bruit is not present. No thyroid mass and no thyromegaly present.  Cardiovascular: Normal rate, regular rhythm and normal heart sounds.   Pulmonary/Chest: Effort normal and breath sounds normal.  Abdominal: Soft. Normal appearance and bowel sounds are normal. There is no hepatosplenomegaly. There is no tenderness.  Musculoskeletal: Normal range of motion.  Hammer toes of right foot  Lymphadenopathy:    She has no cervical adenopathy.    She has no axillary adenopathy.  Neurological: She is alert and oriented to person, place, and time. She has normal strength. No cranial nerve deficit.  Skin: Skin is warm, dry and intact.  Possible tinea of outside of left thigh  Psychiatric: She has a normal mood and affect. Her speech is normal  and behavior is normal. Judgment and thought content normal. Cognition and memory are normal.     Depression Screen PHQ 2/9 Scores 04/25/2016 04/19/2015  PHQ - 2 Score 0 0  PHQ- 9 Score 2 -  Assessment & Plan:     Routine Health Maintenance and Physical Exam  Exercise Activities and Dietary recommendations Goals    None      Immunization History  Administered Date(s) Administered  . Influenza-Unspecified 10/07/2014  . Pneumococcal Conjugate-13 04/13/2014  . Pneumococcal Polysaccharide-23 11/18/2012  . Tdap 07/19/2009  . Zoster 05/26/2009    Health Maintenance  Topic Date Due  . Hepatitis C Screening  04/23/1945  . FOOT EXAM  08/29/1957  . OPHTHALMOLOGY EXAM  08/29/1957  . HEMOGLOBIN A1C  09/05/2015  . MAMMOGRAM  04/07/2018  . TETANUS/TDAP  07/20/2019  . COLONOSCOPY  01/14/2021  . INFLUENZA VACCINE  Addressed  . DEXA SCAN  Completed  . PNA vac Low Risk Adult  Completed     Discussed health benefits of physical activity, and encouraged her to engage in regular exercise appropriate for her age and condition.    -------------------------------------------------------------------- 1. Annual physical exam Stable.  2. Mouth sores Will try Lysine for this problem. If this does not resolve with use of Lysine, will need to have this evaluated by GI.  3. Type 2 diabetes mellitus with complication, with long-term current use of insulin (HCC) F/B by endo. Labs recently checked and chart updated. Dr. Eddie Dibbles did not check CMP or CBC. Will check as below. FU pending results. - CBC with Differential/Platelet - Comprehensive metabolic panel  4. Hair thinning TSH was checked by Dr. Eddie Dibbles on 03/14/2016 and was 1.110. Will refer to dermatology for evaluation. - Ambulatory referral to Dermatology  5. Skin lesion Possible tinea? Does not seem malignant. Will refer to dermatology for further evaluation. - Ambulatory referral to Dermatology  6. Hammer toe of right foot Too  soon for preoperative clearance. Pt will schedule OV 3 weeks prior to surgery to clearance.   Patient seen and examined by Miguel Aschoff, MD, and note scribed by Renaldo Fiddler, CMA. I have done the exam and reviewed the above chart and it is accurate to the best of my knowledge. Development worker, community has been used in this note in any air is in the dictation or transcription are unintentional.  Wilhemena Durie, MD  [place]

## 2016-04-26 LAB — CBC WITH DIFFERENTIAL/PLATELET
BASOS ABS: 0 10*3/uL (ref 0.0–0.2)
Basos: 0 %
EOS (ABSOLUTE): 0.4 10*3/uL (ref 0.0–0.4)
Eos: 5 %
HEMOGLOBIN: 12.5 g/dL (ref 11.1–15.9)
Hematocrit: 38.7 % (ref 34.0–46.6)
Immature Grans (Abs): 0 10*3/uL (ref 0.0–0.1)
Immature Granulocytes: 0 %
LYMPHS ABS: 1.9 10*3/uL (ref 0.7–3.1)
Lymphs: 26 %
MCH: 26.3 pg — AB (ref 26.6–33.0)
MCHC: 32.3 g/dL (ref 31.5–35.7)
MCV: 81 fL (ref 79–97)
MONOS ABS: 0.8 10*3/uL (ref 0.1–0.9)
Monocytes: 11 %
NEUTROS ABS: 4.2 10*3/uL (ref 1.4–7.0)
Neutrophils: 58 %
PLATELETS: 348 10*3/uL (ref 150–379)
RBC: 4.76 x10E6/uL (ref 3.77–5.28)
RDW: 14.2 % (ref 12.3–15.4)
WBC: 7.2 10*3/uL (ref 3.4–10.8)

## 2016-04-26 LAB — COMPREHENSIVE METABOLIC PANEL
ALBUMIN: 4.5 g/dL (ref 3.6–4.8)
ALK PHOS: 50 IU/L (ref 39–117)
ALT: 22 IU/L (ref 0–32)
AST: 22 IU/L (ref 0–40)
Albumin/Globulin Ratio: 2.1 (ref 1.2–2.2)
BUN / CREAT RATIO: 32 — AB (ref 12–28)
BUN: 29 mg/dL — ABNORMAL HIGH (ref 8–27)
Bilirubin Total: 0.4 mg/dL (ref 0.0–1.2)
CO2: 25 mmol/L (ref 18–29)
CREATININE: 0.92 mg/dL (ref 0.57–1.00)
Calcium: 9.7 mg/dL (ref 8.7–10.3)
Chloride: 102 mmol/L (ref 96–106)
GFR calc non Af Amer: 64 mL/min/{1.73_m2} (ref >59)
GFR, EST AFRICAN AMERICAN: 74 mL/min/{1.73_m2} (ref >59)
GLOBULIN, TOTAL: 2.1 g/dL (ref 1.5–4.5)
Glucose: 106 mg/dL — ABNORMAL HIGH (ref 65–99)
Potassium: 4.5 mmol/L (ref 3.5–5.2)
SODIUM: 144 mmol/L (ref 134–144)
Total Protein: 6.6 g/dL (ref 6.0–8.5)

## 2016-04-26 NOTE — Progress Notes (Signed)
Advised  ED 

## 2016-04-30 ENCOUNTER — Other Ambulatory Visit: Payer: Self-pay | Admitting: Podiatry

## 2016-04-30 ENCOUNTER — Telehealth: Payer: Self-pay | Admitting: Family Medicine

## 2016-04-30 NOTE — Telephone Encounter (Signed)
LMTCB pt needs appt for this. His last exam was too early for clearance and pt will need a OV for surgical clearance. (per his last OV note). Thanks.

## 2016-04-30 NOTE — Telephone Encounter (Signed)
Pt returned call and was advised of needing an appt. Pt is scheduled for 05/17/16 @ 4 pm. Thanks TNP

## 2016-04-30 NOTE — Telephone Encounter (Signed)
Pt called saying she was in last week for her wellness and mentioned she was needing a note for her surgery.  The note that needs to be sent has to ben within a thirty day span not over.  Her surgery is o the 25 of April.  Her call back is [phone]  Thank sTeri

## 2016-05-02 DIAGNOSIS — J0110 Acute frontal sinusitis, unspecified: Secondary | ICD-10-CM | POA: Diagnosis not present

## 2016-05-15 DIAGNOSIS — J3089 Other allergic rhinitis: Secondary | ICD-10-CM | POA: Diagnosis not present

## 2016-05-15 DIAGNOSIS — J301 Allergic rhinitis due to pollen: Secondary | ICD-10-CM | POA: Diagnosis not present

## 2016-05-15 DIAGNOSIS — H1045 Other chronic allergic conjunctivitis: Secondary | ICD-10-CM | POA: Diagnosis not present

## 2016-05-17 ENCOUNTER — Encounter: Payer: Self-pay | Admitting: Family Medicine

## 2016-05-17 ENCOUNTER — Ambulatory Visit (INDEPENDENT_AMBULATORY_CARE_PROVIDER_SITE_OTHER): Payer: BLUE CROSS/BLUE SHIELD | Admitting: Family Medicine

## 2016-05-17 VITALS — BP 142/60 | HR 80 | Temp 98.0°F | Resp 16 | Wt 199.0 lb

## 2016-05-17 DIAGNOSIS — Z01818 Encounter for other preprocedural examination: Secondary | ICD-10-CM | POA: Diagnosis not present

## 2016-05-17 NOTE — Progress Notes (Signed)
Patient: Cindy Hester Female    DOB: 07/29/1945   71 y.o.   MRN: [id number] Visit Date: 05/17/2016  Today's Provider: Wilhemena Durie, MD   Chief Complaint  Patient presents with  . Pre-op Exam   Subjective:    HPI   Preoperative Clearance Pt is having a hammer toe correction surgery on 05/30/2016. Surgery will be preformed by Dr. Vickki Muff. Pt is taking ASA 325 mg po qd. Pt denies previous reaction to anesthesia.   No Known Allergies   Current Outpatient Prescriptions:  .  amLODipine (NORVASC) 10 MG tablet, take 1 tablet by mouth once daily, Disp: 90 tablet, Rfl: 3 .  aspirin 325 MG tablet, Take 325 mg by mouth daily.  , Disp: , Rfl:  .  calcium carbonate (OS-CAL) 600 MG TABS tablet, Take by mouth., Disp: , Rfl:  .  canagliflozin (INVOKANA) 300 MG TABS tablet, Take 300 mg by mouth daily before breakfast., Disp: , Rfl:  .  cholecalciferol (VITAMIN D) 1000 UNITS tablet, Take 1,000 Units by mouth once a week. , Disp: , Rfl:  .  furosemide (LASIX) 40 MG tablet, Take 40 mg by mouth daily., Disp: , Rfl:  .  glucosamine-chondroitin (MAX GLUCOSAMINE CHONDROITIN) 500-400 MG tablet, Take 1 tablet by mouth 3 (three) times daily., Disp: 30 tablet, Rfl: 0 .  hydrALAZINE (APRESOLINE) 25 MG tablet, take 1 tablet by mouth twice a day, Disp: 60 tablet, Rfl: 12 .  Liraglutide (VICTOZA) 18 MG/3ML SOPN, INJECT 1.8MG  UNDER THE SKIN ONCE DAILY AS DIRECTED, Disp: , Rfl:  .  losartan-hydrochlorothiazide (HYZAAR) 100-25 MG tablet, take 1 tablet by mouth once daily, Disp: 30 tablet, Rfl: 12 .  metFORMIN (GLUCOPHAGE) 1000 MG tablet, Take 1,000 mg by mouth 2 (two) times daily with a meal. , Disp: , Rfl:  .  metoprolol (TOPROL-XL) 200 MG 24 hr tablet, take 1 tablet by mouth once daily, Disp: 30 tablet, Rfl: 12 .  montelukast (SINGULAIR) 10 MG tablet, Take 1 tablet (10 mg total) by mouth daily., Disp: 90 tablet, Rfl: 3 .  Multiple Vitamin (MULTIVITAMIN) tablet, Take 1 tablet by mouth daily.  , Disp: ,  Rfl:  .  Multiple Vitamins-Minerals (HAIR SKIN AND NAILS FORMULA PO), Take by mouth., Disp: , Rfl:  .  naproxen (NAPROSYN) 500 MG tablet, Take 500 mg by mouth 2 (two) times daily as needed., Disp: , Rfl:  .  pantoprazole (PROTONIX) 40 MG tablet, Take 1 tablet (40 mg total) by mouth every morning., Disp: 30 tablet, Rfl: 12 .  phentermine 30 MG capsule, Take 30 mg by mouth every morning., Disp: , Rfl:  .  pioglitazone (ACTOS) 15 MG tablet, Take 15 mg by mouth daily. Patient takes every other day right now per endocrinologist., Disp: , Rfl:  .  ranitidine (ZANTAC) 150 MG tablet, Take 1 tablet (150 mg total) by mouth at bedtime., Disp: 30 tablet, Rfl: 12 .  rosuvastatin (CRESTOR) 20 MG tablet, Take 1 tablet (20 mg total) by mouth at bedtime., Disp: 90 tablet, Rfl: 3 .  valACYclovir (VALTREX) 500 MG tablet, take 1 tablet by mouth twice a day if needed, Disp: 60 tablet, Rfl: 3 .  vitamin B-12 (CYANOCOBALAMIN) 1000 MCG tablet, Take 1,000 mcg by mouth daily., Disp: , Rfl:  .  XIFAXAN 550 MG TABS tablet, Take 1 tablet by mouth 3 (three) times daily., Disp: , Rfl: 0  Review of Systems  Constitutional: Negative for activity change, appetite change, chills, diaphoresis, fatigue, fever  and unexpected weight change.  Respiratory: Negative for cough, shortness of breath and wheezing.   Cardiovascular: Negative for chest pain, palpitations and leg swelling.  Gastrointestinal: Negative.   Allergic/Immunologic: Negative.   Neurological: Negative.   Psychiatric/Behavioral: Negative.     Social History  Substance Use Topics  . Smoking status: Never Smoker  . Smokeless tobacco: Never Used  . Alcohol use Yes     Comment: maybe 1 drink once a month   Objective:   BP (!) 142/60 (BP Location: Right Arm, Patient Position: Sitting, Cuff Size: Large)   Pulse 80   Temp 98 F (36.7 C) (Oral)   Resp 16   Wt 199 lb (90.3 kg)   SpO2 99%   BMI 31.17 kg/m  Vitals:   05/17/16 1627  BP: (!) 142/60  Pulse: 80    Resp: 16  Temp: 98 F (36.7 C)  TempSrc: Oral  SpO2: 99%  Weight: 199 lb (90.3 kg)     Physical Exam  Constitutional: She is oriented to person, place, and time. She appears well-developed and well-nourished.  HENT:  Head: Normocephalic and atraumatic.  Eyes: Conjunctivae are normal.  Neck: No thyromegaly present.  Cardiovascular: Normal rate, regular rhythm and normal heart sounds.   No carotid bruit  Pulmonary/Chest: Effort normal and breath sounds normal. No respiratory distress.  Abdominal: Soft.  Neurological: She is alert and oriented to person, place, and time.  Skin: Skin is warm and dry.  Psychiatric: She has a normal mood and affect. Her behavior is normal. Judgment and thought content normal.        Assessment & Plan:     1. Preoperative clearance for foot surgery/Hammertoes No s/s current infection or cardiac/neurological problems. Pt is  medically cleared for surgery. - EKG 12-Lead      Patient seen and examined by Miguel Aschoff, MD, and note scribed by Renaldo Fiddler, CMA. I have done the exam and reviewed the above chart and it is accurate to the best of my knowledge. Development worker, community has been used in this note in any air is in the dictation or transcription are unintentional.  Wilhemena Durie, MD  [place]

## 2016-05-23 ENCOUNTER — Other Ambulatory Visit: Payer: Self-pay

## 2016-05-23 DIAGNOSIS — M25532 Pain in left wrist: Secondary | ICD-10-CM | POA: Diagnosis not present

## 2016-05-23 DIAGNOSIS — M654 Radial styloid tenosynovitis [de Quervain]: Secondary | ICD-10-CM | POA: Diagnosis not present

## 2016-05-23 DIAGNOSIS — M1812 Unilateral primary osteoarthritis of first carpometacarpal joint, left hand: Secondary | ICD-10-CM | POA: Diagnosis not present

## 2016-05-23 NOTE — Progress Notes (Signed)
error 

## 2016-05-24 DIAGNOSIS — M2041 Other hammer toe(s) (acquired), right foot: Secondary | ICD-10-CM | POA: Diagnosis not present

## 2016-05-24 DIAGNOSIS — J301 Allergic rhinitis due to pollen: Secondary | ICD-10-CM | POA: Diagnosis not present

## 2016-05-24 DIAGNOSIS — M2042 Other hammer toe(s) (acquired), left foot: Secondary | ICD-10-CM | POA: Diagnosis not present

## 2016-05-24 DIAGNOSIS — J3089 Other allergic rhinitis: Secondary | ICD-10-CM | POA: Diagnosis not present

## 2016-05-28 ENCOUNTER — Encounter: Payer: Self-pay | Admitting: *Deleted

## 2016-05-28 NOTE — Discharge Instructions (Signed)
[facility] °[facility] ° °POST OPERATIVE INSTRUCTIONS FOR DR. TROXLER AND DR. FOWLER °[facility] ° ° °1. Take your medication as prescribed.  Pain medication should be taken only as needed. ° °2. Keep the dressing clean, dry and intact. ° °3. Keep your foot elevated above the heart level for the first 48 hours. ° °4. Walking to the bathroom and brief periods of walking are acceptable, unless we have instructed you to be non-weight bearing. ° °5. Always wear your post-op shoe when walking.  Always use your crutches if you are to be non-weight bearing. ° °6. Do not take a shower. Baths are permissible as long as the foot is kept out of the water.  ° °7. Every hour you are awake:  °- Bend your knee 15 times. °- Flex foot 15 times °- Massage calf 15 times ° °8. Call [facility] ([phone]) if any of the following problems occur: °- You develop a temperature or fever. °- The bandage becomes saturated with blood. °- Medication does not stop your pain. °- Injury of the foot occurs. °- Any symptoms of infection including redness, odor, or red streaks running from wound. ° ° °General Anesthesia, Adult, Care After °These instructions provide you with information about caring for yourself after your procedure. Your health care provider may also give you more specific instructions. Your treatment has been planned according to current medical practices, but problems sometimes occur. Call your health care provider if you have any problems or questions after your procedure. °What can I expect after the procedure? °After the procedure, it is common to have: °· Vomiting. °· A sore throat. °· Mental slowness. °It is common to feel: °· Nauseous. °· Cold or shivery. °· Sleepy. °· Tired. °· Sore or achy, even in parts of your body where you did not have surgery. °Follow these instructions at home: °For at least 24 hours after the procedure: °· Do not: °¨ Participate in  activities where you could fall or become injured. °¨ Drive. °¨ Use heavy machinery. °¨ Drink alcohol. °¨ Take sleeping pills or medicines that cause drowsiness. °¨ Make important decisions or sign legal documents. °¨ Take care of children on your own. °· Rest. °Eating and drinking °· If you vomit, drink water, juice, or soup when you can drink without vomiting. °· Drink enough fluid to keep your urine clear or pale yellow. °· Make sure you have little or no nausea before eating solid foods. °· Follow the diet recommended by your health care provider. °General instructions °· Have a responsible adult stay with you until you are awake and alert. °· Return to your normal activities as told by your health care provider. Ask your health care provider what activities are safe for you. °· Take over-the-counter and prescription medicines only as told by your health care provider. °· If you smoke, do not smoke without supervision. °· Keep all follow-up visits as told by your health care provider. This is important. °Contact a health care provider if: °· You continue to have nausea or vomiting at home, and medicines are not helpful. °· You cannot drink fluids or start eating again. °· You cannot urinate after 8-12 hours. °· You develop a skin rash. °· You have fever. °· You have increasing redness at the site of your procedure. °Get help right away if: °· You have difficulty breathing. °· You have chest pain. °· You have unexpected bleeding. °· You feel that you are having   a life-threatening or urgent problem. °This information is not intended to replace advice given to you by your health care provider. Make sure you discuss any questions you have with your health care provider. °Document Released: 04/30/2000 Document Revised: 06/27/2015 Document Reviewed: 01/06/2015 °Elsevier Interactive Patient Education © 2017 [employer] ° °

## 2016-05-30 ENCOUNTER — Ambulatory Visit: Payer: BLUE CROSS/BLUE SHIELD | Admitting: Anesthesiology

## 2016-05-30 ENCOUNTER — Ambulatory Visit
Admission: RE | Admit: 2016-05-30 | Discharge: 2016-05-30 | Disposition: A | Discharge status: Home / Self Care | Payer: BLUE CROSS/BLUE SHIELD | Source: Ambulatory Visit | Attending: Podiatry | Admitting: Podiatry

## 2016-05-30 ENCOUNTER — Encounter
Admission: RE | Disposition: A | Discharge status: Home / Self Care | Payer: Self-pay | Source: Ambulatory Visit | Attending: Podiatry

## 2016-05-30 DIAGNOSIS — M2041 Other hammer toe(s) (acquired), right foot: Secondary | ICD-10-CM | POA: Insufficient documentation

## 2016-05-30 DIAGNOSIS — M205X2 Other deformities of toe(s) (acquired), left foot: Secondary | ICD-10-CM | POA: Insufficient documentation

## 2016-05-30 DIAGNOSIS — M24574 Contracture, right foot: Secondary | ICD-10-CM | POA: Diagnosis not present

## 2016-05-30 DIAGNOSIS — M205X1 Other deformities of toe(s) (acquired), right foot: Secondary | ICD-10-CM | POA: Diagnosis not present

## 2016-05-30 DIAGNOSIS — I10 Essential (primary) hypertension: Secondary | ICD-10-CM | POA: Insufficient documentation

## 2016-05-30 DIAGNOSIS — M2042 Other hammer toe(s) (acquired), left foot: Secondary | ICD-10-CM | POA: Diagnosis not present

## 2016-05-30 DIAGNOSIS — G4730 Sleep apnea, unspecified: Secondary | ICD-10-CM | POA: Insufficient documentation

## 2016-05-30 HISTORY — PX: HAMMER TOE SURGERY: SHX385

## 2016-05-30 LAB — GLUCOSE, CAPILLARY
Glucose-Capillary: 106 mg/dL — ABNORMAL HIGH (ref 65–99)
Glucose-Capillary: 109 mg/dL — ABNORMAL HIGH (ref 65–99)

## 2016-05-30 SURGERY — CORRECTION, HAMMER TOE
Anesthesia: General | Site: Foot | Laterality: Bilateral | Wound class: Clean

## 2016-05-30 MED ORDER — LIDOCAINE HCL (PF) 1 % IJ SOLN
INTRAMUSCULAR | Status: DC | PRN
  Administered 2016-05-30: 8.5 mL

## 2016-05-30 MED ORDER — LACTATED RINGERS IV SOLN
INTRAVENOUS | Status: DC | PRN
  Administered 2016-05-30: 12:00:00 via INTRAVENOUS

## 2016-05-30 MED ORDER — MIDAZOLAM HCL 5 MG/5ML IJ SOLN
INTRAMUSCULAR | Status: DC | PRN
  Administered 2016-05-30: 2 mg via INTRAVENOUS

## 2016-05-30 MED ORDER — FENTANYL CITRATE (PF) 100 MCG/2ML IJ SOLN
INTRAMUSCULAR | Status: DC | PRN
  Administered 2016-05-30: 100 ug via INTRAVENOUS

## 2016-05-30 MED ORDER — ONDANSETRON HCL 4 MG/2ML IJ SOLN
INTRAMUSCULAR | Status: DC | PRN
  Administered 2016-05-30: 4 mg via INTRAVENOUS

## 2016-05-30 MED ORDER — DEXAMETHASONE SODIUM PHOSPHATE 4 MG/ML IJ SOLN
INTRAMUSCULAR | Status: DC | PRN
  Administered 2016-05-30: 8 mg via INTRAVENOUS

## 2016-05-30 MED ORDER — CHLORHEXIDINE GLUCONATE 4 % EX LIQD: 60.0000 mL | Freq: Once | CUTANEOUS | Status: DC

## 2016-05-30 MED ORDER — PROPOFOL 10 MG/ML IV BOLUS
INTRAVENOUS | Status: DC | PRN
  Administered 2016-05-30: 200 mg via INTRAVENOUS

## 2016-05-30 MED ORDER — OXYCODONE-ACETAMINOPHEN 5-325 MG PO TABS: 1.0000 | ORAL_TABLET | ORAL | 0 refills | Status: DC | PRN

## 2016-05-30 MED ORDER — BUPIVACAINE HCL (PF) 0.25 % IJ SOLN
INTRAMUSCULAR | Status: DC | PRN
  Administered 2016-05-30: 8.5 mL
  Administered 2016-05-30: 10 mL

## 2016-05-30 MED ORDER — LIDOCAINE HCL (CARDIAC) 20 MG/ML IV SOLN
INTRAVENOUS | Status: DC | PRN
  Administered 2016-05-30: 50 mg via INTRATRACHEAL

## 2016-05-30 MED ORDER — CEFAZOLIN SODIUM-DEXTROSE 2-4 GM/100ML-% IV SOLN
2.0000 g | INTRAVENOUS | Status: AC
  Administered 2016-05-30: 2 g via INTRAVENOUS

## 2016-05-30 SURGICAL SUPPLY — 51 items
APL SKNCLS STERI-STRIP NONHPOA (GAUZE/BANDAGES/DRESSINGS)
BANDAGE ELASTIC 4 VELCRO NS (GAUZE/BANDAGES/DRESSINGS) ×4 IMPLANT
BENZOIN TINCTURE PRP APPL 2/3 (GAUZE/BANDAGES/DRESSINGS) IMPLANT
BLADE MED AGGRESSIVE (BLADE) IMPLANT
BLADE OSC/SAGITTAL MD 5.5X18 (BLADE) ×2 IMPLANT
BNDG CMPR 75X41 PLY HI ABS (GAUZE/BANDAGES/DRESSINGS) ×2
BNDG COHESIVE 4X5 TAN STRL (GAUZE/BANDAGES/DRESSINGS) ×2 IMPLANT
BNDG ESMARK 4X12 TAN STRL LF (GAUZE/BANDAGES/DRESSINGS) ×2 IMPLANT
BNDG GAUZE 4.5X4.1 6PLY STRL (MISCELLANEOUS) ×4 IMPLANT
BNDG STRETCH 4X75 STRL LF (GAUZE/BANDAGES/DRESSINGS) ×3 IMPLANT
CANISTER SUCT 1200ML W/VALVE (MISCELLANEOUS) ×2 IMPLANT
COVER LIGHT HANDLE UNIVERSAL (MISCELLANEOUS) ×4 IMPLANT
CUFF TOURN SGL QUICK 18 (TOURNIQUET CUFF) ×4 IMPLANT
DRAPE BILATERAL LIMB T (DRAPES) ×2 IMPLANT
DRAPE FLUOR MINI C-ARM 54X84 (DRAPES) ×2 IMPLANT
DURAPREP 26ML APPLICATOR (WOUND CARE) ×3 IMPLANT
FIXATION HAMMERTOE 17 SM ANGLD (Toe) ×1 IMPLANT
FIXATION HAMMERTOE ANGLD 15MM (Toe) IMPLANT
GAUZE PETRO XEROFOAM 1X8 (MISCELLANEOUS) ×3 IMPLANT
GAUZE SPONGE 4X4 12PLY STRL (GAUZE/BANDAGES/DRESSINGS) ×3 IMPLANT
GLOVE BIO SURGEON STRL SZ7.5 (GLOVE) ×3 IMPLANT
GLOVE INDICATOR 8.0 STRL GRN (GLOVE) ×3 IMPLANT
GOWN STRL REUS W/ TWL LRG LVL3 (GOWN DISPOSABLE) ×2 IMPLANT
GOWN STRL REUS W/TWL LRG LVL3 (GOWN DISPOSABLE) ×4
HAMMERTOE 17MM SM ANGLED (Toe) ×2 IMPLANT
HAMMERTOE ANGLED 15MM 5MM (Toe) ×2 IMPLANT
K-WIRE DBL END TROCAR 6X.045 (WIRE) ×4
K-WIRE DBL END TROCAR 6X.062 (WIRE)
KIT DRILL HAMMERLOCK2 IMPLANT (BIT) ×1 IMPLANT
KIT ROOM TURNOVER OR (KITS) ×2 IMPLANT
KWIRE DBL END TROCAR 6X.045 (WIRE) ×2 IMPLANT
KWIRE DBL END TROCAR 6X.062 (WIRE) IMPLANT
NS IRRIG 500ML POUR BTL (IV SOLUTION) ×2 IMPLANT
PACK EXTREMITY ARMC (MISCELLANEOUS) ×2 IMPLANT
PAD GROUND ADULT SPLIT (MISCELLANEOUS) ×2 IMPLANT
PIN BALLS 3/8 F/.045 WIRE (MISCELLANEOUS) ×3 IMPLANT
RASP SM TEAR CROSS CUT (RASP) IMPLANT
STOCKINETTE IMPERVIOUS LG (DRAPES) ×4 IMPLANT
STRAP BODY AND KNEE 60X3 (MISCELLANEOUS) ×2 IMPLANT
STRIP CLOSURE SKIN 1/4X4 (GAUZE/BANDAGES/DRESSINGS) ×1 IMPLANT
SUT ETHILON 4-0 (SUTURE)
SUT ETHILON 4-0 FS2 18XMFL BLK (SUTURE)
SUT ETHILON 5-0 FS-2 18 BLK (SUTURE) ×2 IMPLANT
SUT MNCRL 5-0+ PC-1 (SUTURE) IMPLANT
SUT MONOCRYL 5-0 (SUTURE)
SUT VIC AB 2-0 SH 27 (SUTURE)
SUT VIC AB 2-0 SH 27XBRD (SUTURE) IMPLANT
SUT VIC AB 3-0 SH 27 (SUTURE)
SUT VIC AB 3-0 SH 27X BRD (SUTURE) IMPLANT
SUT VIC AB 4-0 FS2 27 (SUTURE) ×1 IMPLANT
SUTURE ETHLN 4-0 FS2 18XMF BLK (SUTURE) IMPLANT

## 2016-05-30 NOTE — Anesthesia Postprocedure Evaluation (Signed)
Anesthesia Post Note  Patient: Cindy Hester  Procedure(s) Performed: Procedure(s) (LRB): HAMMER TOE CORRECTION RIGHT 2ND, 3RD, 4TH, 5TH TOES LEFT 5TH TOE (Bilateral)  Patient location during evaluation: PACU Anesthesia Type: General Level of consciousness: awake Pain management: pain level controlled Vital Signs Assessment: post-procedure vital signs reviewed and stable Respiratory status: spontaneous breathing Cardiovascular status: blood pressure returned to baseline Postop Assessment: no headache Anesthetic complications: no    Jaci Standard, III,  Annalie Wenner D

## 2016-05-30 NOTE — Transfer of Care (Signed)
Immediate Anesthesia Transfer of Care Note  Patient: Cindy Hester  Procedure(s) Performed: Procedure(s): HAMMER TOE CORRECTION RIGHT 2ND, 3RD, 4TH, 5TH TOES LEFT 5TH TOE (Bilateral)  Patient Location: PACU  Anesthesia Type: General LMA  Level of Consciousness: awake, alert  and patient cooperative  Airway and Oxygen Therapy: Patient Spontanous Breathing and Patient connected to supplemental oxygen  Post-op Assessment: Post-op Vital signs reviewed, Patient's Cardiovascular Status Stable, Respiratory Function Stable, Patent Airway and No signs of Nausea or vomiting  Post-op Vital Signs: Reviewed and stable  Complications: No apparent anesthesia complications

## 2016-05-30 NOTE — Op Note (Signed)
Operative note   Surgeon:Traycen Goyer    Assistant:none    Preop diagnosis: Hammertoes 2,3,4,5 right foot,  contracture metatarsophalangeal joint right second toe, and 5th toe left foot    Postop diagnosis:same    Procedure:1.PIPJ arthrodesis with hammerlock implant right second toe. 2. PIPJ arthrodesis with hammerlock implant right third toe. 3. PIPJ arthrodesis with K wire pinning right fourth toe. 4. PIPJ arthroplasty right fifth toe with K wire pinning. 5. PIPJ derotational arthroplasty left fifth toe 6. Metatarsophalangeal joint release right second metatarsophalangeal joint    EBL: Minimal    Anesthesia:local and general    Hemostasis: Right ankle tourniquet inflated to 200 mmHg for approximately 85 minutes and left ankle tourniquet inflated to 200 mmHg for approximately 19 minutes    Specimen: None    Complications: None    Operative indications:Cindy Hester is an 71 y.o. that presents today for surgical intervention.  The risks/benefits/alternatives/complications have been discussed and consent has been given.    Procedure:  Patient was brought into the OR and placed on the operating table in thesupine position. After anesthesia was obtained theright and left lower extremity was prepped and draped in usual sterile fashion.  Attention was initially directed to the right foot where after inflation of the tourniquet longitudinal incision was made over the PIPJ's of the right second third fourth and fifth toes. Sharp and blunt dissection carried down to the extensor tendons. Extensor tenotomies were then performed reflecting the tendon proximally. The head of the proximal phalanx and base of the middle phalanx were exposed to all 4 toes. At this time the head of proximal phalanx was removed from all 4 toes. The base of the middle phalanx was removed on the second third and fourth toes. At this time hammerlock implant same medium angled on the right second and a small angled implant  were placed into the second and third toes with good alignment. Tensor made to perform the hammerlock implant on the third toe and the joint was unable to accommodate the implant. At this time the PIPJ was impacted and pinned with a 0.045 K wire crossing the DIPJ PIPJ and MTPJ with good alignment. The fifth toe was held in a good position and a 0.045 K wire was used to pin this to the base of the proximal phalanx crossing the DIP and PIP. Good alignment was noted in all planes. All wounds were flushed with copious amounts or irrigation. The second toe did sit in a slightly dorsiflexed position and at this time a metatarsophalangeal joint capsule release was performed at the dorsal medial and lateral aspect. This brought the second toe in a much better realign position. Final irrigation was then performed and closure was performed with a 4-0 Vicryl for the tendons and 5-0 nylon for the skin. The tourniquet was released and the toes were then covered with slight compression.  Attention was then directed to the left fifth toe where after inflation of the tourniquet a derotational incision was performed overlying the PIPJ with 2 semielliptical incisions. The skin ellipse was then removed. Sharp and blunt dissection was carried down to the long extensor tendon. This was transected at the PIPJ. There was noted be a very prominent head of the proximal phalanx with exostosis on the medial and lateral aspect. Just proximal to this the head of the proximal phalanx was excised with a power saw. The toe was held in a derotational position and after copious amounts or irrigation and closure was then  performed with a 4-0 Vicryl of the extensor tendon and a 5-0 nylon for the skin. All areas were infiltrated with 0.25% Marcaine postoperatively. Sterile dressings were applied.    Patient tolerated the procedure and anesthesia well.  Was transported from the OR to the PACU with all vital signs stable and vascular status intact.  To be discharged per routine protocol.  Will follow up in approximately 1 week in the outpatient clinic.

## 2016-05-30 NOTE — Anesthesia Procedure Notes (Signed)
Procedure Name: LMA Insertion Date/Time: 05/30/2016 12:28 PM Performed by: Londell Moh Pre-anesthesia Checklist: Patient identified, Emergency Drugs available, Suction available, Timeout performed and Patient being monitored Patient Re-evaluated:Patient Re-evaluated prior to inductionOxygen Delivery Method: Circle system utilized Preoxygenation: Pre-oxygenation with 100% oxygen Intubation Type: IV induction LMA: LMA inserted LMA Size: 4.0 Number of attempts: 1 Placement Confirmation: positive ETCO2 and breath sounds checked- equal and bilateral Tube secured with: Tape

## 2016-05-30 NOTE — Anesthesia Preprocedure Evaluation (Signed)
Anesthesia Evaluation  Patient identified by MRN, date of birth, ID band Patient awake    Reviewed: Allergy & Precautions, H&P , NPO status , Patient's Chart, lab work & pertinent test results  Airway Mallampati: II  TM Distance: >3 FB Neck ROM: full    Dental no notable dental hx.    Pulmonary sleep apnea ,    Pulmonary exam normal        Cardiovascular hypertension, On Medications Normal cardiovascular exam     Neuro/Psych    GI/Hepatic Medicated,  Endo/Other  diabetesHypothyroidism   Renal/GU      Musculoskeletal   Abdominal   Peds  Hematology   Anesthesia Other Findings   Reproductive/Obstetrics                             Anesthesia Physical Anesthesia Plan  ASA: II  Anesthesia Plan: General LMA   Post-op Pain Management:    Induction:   Airway Management Planned:   Additional Equipment:   Intra-op Plan:   Post-operative Plan:   Informed Consent: I have reviewed the patients History and Physical, chart, labs and discussed the procedure including the risks, benefits and alternatives for the proposed anesthesia with the patient or authorized representative who has indicated his/her understanding and acceptance.     Plan Discussed with:   Anesthesia Plan Comments:         Anesthesia Quick Evaluation

## 2016-05-30 NOTE — H&P (Signed)
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  The various methods of treatment have been discussed with the patient.  No guarantees were given.  After consideration of risks, benefits and other options for treatment, the patient has consented to surgery.  I have reviewed the patients' chart and labs.    Patient Vitals for the past 24 hrs:  BP Temp Temp src Resp SpO2 Height Weight  05/30/16 1124 (!) 169/79 97.7 F (36.5 C) Tympanic 16 99 % 5\' 7"  (1.702 m) 86.2 kg (190 lb)    A history and physical examination was performed in my office.  The patient was reexamined.  There have been no changes to this history and physical examination.  Samara Deist A

## 2016-05-31 ENCOUNTER — Encounter: Payer: Self-pay | Admitting: Podiatry

## 2016-06-05 DIAGNOSIS — J3089 Other allergic rhinitis: Secondary | ICD-10-CM | POA: Diagnosis not present

## 2016-06-05 DIAGNOSIS — M2041 Other hammer toe(s) (acquired), right foot: Secondary | ICD-10-CM | POA: Diagnosis not present

## 2016-06-05 DIAGNOSIS — M2042 Other hammer toe(s) (acquired), left foot: Secondary | ICD-10-CM | POA: Diagnosis not present

## 2016-06-05 DIAGNOSIS — J301 Allergic rhinitis due to pollen: Secondary | ICD-10-CM | POA: Diagnosis not present

## 2016-06-07 DIAGNOSIS — G4730 Sleep apnea, unspecified: Secondary | ICD-10-CM | POA: Diagnosis not present

## 2016-06-12 DIAGNOSIS — J3089 Other allergic rhinitis: Secondary | ICD-10-CM | POA: Diagnosis not present

## 2016-06-12 DIAGNOSIS — J301 Allergic rhinitis due to pollen: Secondary | ICD-10-CM | POA: Diagnosis not present

## 2016-06-18 DIAGNOSIS — E119 Type 2 diabetes mellitus without complications: Secondary | ICD-10-CM | POA: Diagnosis not present

## 2016-06-18 LAB — HM DIABETES EYE EXAM

## 2016-06-19 DIAGNOSIS — J3089 Other allergic rhinitis: Secondary | ICD-10-CM | POA: Diagnosis not present

## 2016-06-19 DIAGNOSIS — J301 Allergic rhinitis due to pollen: Secondary | ICD-10-CM | POA: Diagnosis not present

## 2016-06-25 DIAGNOSIS — L659 Nonscarring hair loss, unspecified: Secondary | ICD-10-CM | POA: Diagnosis not present

## 2016-06-26 DIAGNOSIS — M2042 Other hammer toe(s) (acquired), left foot: Secondary | ICD-10-CM | POA: Diagnosis not present

## 2016-06-26 DIAGNOSIS — M2041 Other hammer toe(s) (acquired), right foot: Secondary | ICD-10-CM | POA: Diagnosis not present

## 2016-07-03 DIAGNOSIS — J301 Allergic rhinitis due to pollen: Secondary | ICD-10-CM | POA: Diagnosis not present

## 2016-07-03 DIAGNOSIS — J3089 Other allergic rhinitis: Secondary | ICD-10-CM | POA: Diagnosis not present

## 2016-07-10 DIAGNOSIS — J301 Allergic rhinitis due to pollen: Secondary | ICD-10-CM | POA: Diagnosis not present

## 2016-07-10 DIAGNOSIS — J3089 Other allergic rhinitis: Secondary | ICD-10-CM | POA: Diagnosis not present

## 2016-07-17 DIAGNOSIS — J3089 Other allergic rhinitis: Secondary | ICD-10-CM | POA: Diagnosis not present

## 2016-07-17 DIAGNOSIS — J301 Allergic rhinitis due to pollen: Secondary | ICD-10-CM | POA: Diagnosis not present

## 2016-07-26 DIAGNOSIS — J3089 Other allergic rhinitis: Secondary | ICD-10-CM | POA: Diagnosis not present

## 2016-07-26 DIAGNOSIS — J301 Allergic rhinitis due to pollen: Secondary | ICD-10-CM | POA: Diagnosis not present

## 2016-07-31 DIAGNOSIS — J3089 Other allergic rhinitis: Secondary | ICD-10-CM | POA: Diagnosis not present

## 2016-07-31 DIAGNOSIS — J301 Allergic rhinitis due to pollen: Secondary | ICD-10-CM | POA: Diagnosis not present

## 2016-08-07 DIAGNOSIS — M2041 Other hammer toe(s) (acquired), right foot: Secondary | ICD-10-CM | POA: Diagnosis not present

## 2016-08-07 DIAGNOSIS — J3089 Other allergic rhinitis: Secondary | ICD-10-CM | POA: Diagnosis not present

## 2016-08-14 DIAGNOSIS — J301 Allergic rhinitis due to pollen: Secondary | ICD-10-CM | POA: Diagnosis not present

## 2016-08-14 DIAGNOSIS — J3089 Other allergic rhinitis: Secondary | ICD-10-CM | POA: Diagnosis not present

## 2016-08-21 DIAGNOSIS — J3089 Other allergic rhinitis: Secondary | ICD-10-CM | POA: Diagnosis not present

## 2016-08-21 DIAGNOSIS — J301 Allergic rhinitis due to pollen: Secondary | ICD-10-CM | POA: Diagnosis not present

## 2016-09-04 DIAGNOSIS — E119 Type 2 diabetes mellitus without complications: Secondary | ICD-10-CM | POA: Diagnosis not present

## 2016-09-04 DIAGNOSIS — J3089 Other allergic rhinitis: Secondary | ICD-10-CM | POA: Diagnosis not present

## 2016-09-04 DIAGNOSIS — J301 Allergic rhinitis due to pollen: Secondary | ICD-10-CM | POA: Diagnosis not present

## 2016-09-05 ENCOUNTER — Other Ambulatory Visit: Payer: Self-pay | Admitting: Family Medicine

## 2016-09-05 DIAGNOSIS — G4730 Sleep apnea, unspecified: Secondary | ICD-10-CM | POA: Diagnosis not present

## 2016-09-05 DIAGNOSIS — E785 Hyperlipidemia, unspecified: Secondary | ICD-10-CM

## 2016-09-06 DIAGNOSIS — G4730 Sleep apnea, unspecified: Secondary | ICD-10-CM | POA: Diagnosis not present

## 2016-09-10 ENCOUNTER — Telehealth: Payer: Self-pay | Admitting: Family Medicine

## 2016-09-10 DIAGNOSIS — M1810 Unilateral primary osteoarthritis of first carpometacarpal joint, unspecified hand: Secondary | ICD-10-CM

## 2016-09-10 NOTE — Telephone Encounter (Signed)
Pt needs refill on her naproxen 500mg . She hasnt taken it fo r awhile but now needs a refill She uses [facility] on [address]  ThankSTeri

## 2016-09-10 NOTE — Telephone Encounter (Signed)
Lmtcb, want to see what patient is taking this medication for-aa

## 2016-09-11 DIAGNOSIS — E781 Pure hyperglyceridemia: Secondary | ICD-10-CM | POA: Diagnosis not present

## 2016-09-11 DIAGNOSIS — E785 Hyperlipidemia, unspecified: Secondary | ICD-10-CM | POA: Diagnosis not present

## 2016-09-11 DIAGNOSIS — J301 Allergic rhinitis due to pollen: Secondary | ICD-10-CM | POA: Diagnosis not present

## 2016-09-11 DIAGNOSIS — E119 Type 2 diabetes mellitus without complications: Secondary | ICD-10-CM | POA: Diagnosis not present

## 2016-09-11 DIAGNOSIS — J3089 Other allergic rhinitis: Secondary | ICD-10-CM | POA: Diagnosis not present

## 2016-09-11 LAB — BASIC METABOLIC PANEL
BUN: 15 (ref 4–21)
CREATININE: 0.8 (ref 0.5–1.1)
Glucose: 144
Potassium: 4.1 (ref 3.4–5.3)
Sodium: 142 (ref 137–147)

## 2016-09-11 LAB — LIPID PANEL
Cholesterol: 145 (ref 0–200)
HDL: 53 (ref 35–70)
LDL CALC: 51
LDl/HDL Ratio: 2.7
Triglycerides: 203 — AB (ref 40–160)

## 2016-09-11 LAB — HEMOGLOBIN A1C: HEMOGLOBIN A1C: 7.2

## 2016-09-11 MED ORDER — NAPROXEN 500 MG PO TABS: 500.0000 mg | ORAL_TABLET | Freq: Two times a day (BID) | ORAL | 1 refills | Status: DC | PRN

## 2016-09-11 NOTE — Telephone Encounter (Signed)
Patient states a while back she was started on naproxen for knee pain-she had torn meniscus, this got better and she stopped. 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 She has re started Naproxen to help with this pain. Please review-aa

## 2016-09-11 NOTE — Telephone Encounter (Signed)
Naproxen sent to [place]. She needs to be careful with taking naproxen and ASA together due to GI bleed risk. Make sure to take with food and discontinue if any stomach pain starts.

## 2016-09-18 DIAGNOSIS — E119 Type 2 diabetes mellitus without complications: Secondary | ICD-10-CM | POA: Diagnosis not present

## 2016-09-18 DIAGNOSIS — E538 Deficiency of other specified B group vitamins: Secondary | ICD-10-CM | POA: Diagnosis not present

## 2016-09-18 DIAGNOSIS — E785 Hyperlipidemia, unspecified: Secondary | ICD-10-CM | POA: Diagnosis not present

## 2016-09-18 DIAGNOSIS — E781 Pure hyperglyceridemia: Secondary | ICD-10-CM | POA: Diagnosis not present

## 2016-09-20 DIAGNOSIS — J301 Allergic rhinitis due to pollen: Secondary | ICD-10-CM | POA: Diagnosis not present

## 2016-09-20 DIAGNOSIS — J3089 Other allergic rhinitis: Secondary | ICD-10-CM | POA: Diagnosis not present

## 2016-09-25 DIAGNOSIS — J301 Allergic rhinitis due to pollen: Secondary | ICD-10-CM | POA: Diagnosis not present

## 2016-09-25 DIAGNOSIS — J3089 Other allergic rhinitis: Secondary | ICD-10-CM | POA: Diagnosis not present

## 2016-10-02 DIAGNOSIS — J301 Allergic rhinitis due to pollen: Secondary | ICD-10-CM | POA: Diagnosis not present

## 2016-10-02 DIAGNOSIS — J3089 Other allergic rhinitis: Secondary | ICD-10-CM | POA: Diagnosis not present

## 2016-10-09 DIAGNOSIS — J3089 Other allergic rhinitis: Secondary | ICD-10-CM | POA: Diagnosis not present

## 2016-10-09 DIAGNOSIS — J301 Allergic rhinitis due to pollen: Secondary | ICD-10-CM | POA: Diagnosis not present

## 2016-10-12 ENCOUNTER — Other Ambulatory Visit: Payer: Self-pay | Admitting: Student

## 2016-10-12 DIAGNOSIS — K2970 Gastritis, unspecified, without bleeding: Secondary | ICD-10-CM

## 2016-10-12 DIAGNOSIS — R141 Gas pain: Secondary | ICD-10-CM

## 2016-10-12 DIAGNOSIS — K2100 Gastro-esophageal reflux disease with esophagitis, without bleeding: Secondary | ICD-10-CM

## 2016-10-12 DIAGNOSIS — R140 Abdominal distension (gaseous): Secondary | ICD-10-CM

## 2016-10-16 DIAGNOSIS — J301 Allergic rhinitis due to pollen: Secondary | ICD-10-CM | POA: Diagnosis not present

## 2016-10-16 DIAGNOSIS — J3089 Other allergic rhinitis: Secondary | ICD-10-CM | POA: Diagnosis not present

## 2016-10-23 ENCOUNTER — Telehealth: Payer: Self-pay | Admitting: Gastroenterology

## 2016-10-23 DIAGNOSIS — J301 Allergic rhinitis due to pollen: Secondary | ICD-10-CM | POA: Diagnosis not present

## 2016-10-23 DIAGNOSIS — J3089 Other allergic rhinitis: Secondary | ICD-10-CM | POA: Diagnosis not present

## 2016-10-23 NOTE — Telephone Encounter (Signed)
Received referral for patient to be seen for second opinion at office. Pt not requesting certain doctor. Pt was seen in past year by [facility]. Previous gi records placed on DOD for 10/16/16; Dr.Armbruster's desk for review.

## 2016-10-24 ENCOUNTER — Encounter: Payer: Self-pay | Admitting: Gastroenterology

## 2016-10-24 ENCOUNTER — Other Ambulatory Visit: Payer: Self-pay | Admitting: Family Medicine

## 2016-10-24 NOTE — Telephone Encounter (Signed)
Dr.Armbruster reviewed records and accepted patient to be scheduled for an office visit. Patient has been scheduled for an office visit 12/17/16 with Dr.Armbruster.

## 2016-10-30 DIAGNOSIS — J3089 Other allergic rhinitis: Secondary | ICD-10-CM | POA: Diagnosis not present

## 2016-10-30 DIAGNOSIS — J301 Allergic rhinitis due to pollen: Secondary | ICD-10-CM | POA: Diagnosis not present

## 2016-10-31 ENCOUNTER — Ambulatory Visit (INDEPENDENT_AMBULATORY_CARE_PROVIDER_SITE_OTHER): Payer: BLUE CROSS/BLUE SHIELD | Admitting: Family Medicine

## 2016-10-31 VITALS — BP 120/62 | HR 76 | Temp 98.5°F | Resp 14 | Wt 190.4 lb

## 2016-10-31 DIAGNOSIS — R142 Eructation: Secondary | ICD-10-CM | POA: Diagnosis not present

## 2016-10-31 DIAGNOSIS — K219 Gastro-esophageal reflux disease without esophagitis: Secondary | ICD-10-CM

## 2016-10-31 DIAGNOSIS — M1810 Unilateral primary osteoarthritis of first carpometacarpal joint, unspecified hand: Secondary | ICD-10-CM

## 2016-10-31 DIAGNOSIS — K1379 Other lesions of oral mucosa: Secondary | ICD-10-CM | POA: Diagnosis not present

## 2016-10-31 DIAGNOSIS — E118 Type 2 diabetes mellitus with unspecified complications: Secondary | ICD-10-CM | POA: Diagnosis not present

## 2016-10-31 DIAGNOSIS — Z23 Encounter for immunization: Secondary | ICD-10-CM

## 2016-10-31 DIAGNOSIS — I10 Essential (primary) hypertension: Secondary | ICD-10-CM | POA: Diagnosis not present

## 2016-10-31 DIAGNOSIS — Z794 Long term (current) use of insulin: Secondary | ICD-10-CM | POA: Diagnosis not present

## 2016-10-31 LAB — ALBUMIN, URINE, RANDOM

## 2016-10-31 MED ORDER — SUCRALFATE 1 G PO TABS: 1.0000 g | ORAL_TABLET | Freq: Three times a day (TID) | ORAL | 5 refills | Status: DC

## 2016-10-31 NOTE — Patient Instructions (Addendum)
Try topical treatment for pain you are having. With GI issues you are having I suggest to stop taking Naproxen at this time. No Advil, Ibuprofen or Aleve. You can take Tylenol.

## 2016-10-31 NOTE — Progress Notes (Signed)
Cindy Hester  MRN: [id number] DOB: 08-11-1945  Subjective:  HPI   Patient is here for 6 months follow up.  HTN: patient is not checking her b/p. No cardiac symptoms present. BP Readings from Last 3 Encounters:  10/31/16 120/62  05/30/16 (!) 167/73  05/17/16 (!) 142/60   Wt Readings from Last 3 Encounters:  10/31/16 190 lb 6.4 oz (86.4 kg)  05/30/16 190 lb (86.2 kg)  05/17/16 199 lb (90.3 kg)   Patient had Metc, Lipid done on 09/11/16. A1C with Dr Eddie Dibbles and it was 7.2  Patient is still having GI issues and mouth sores that she discussed with [place] in March 2018. She has been seen GI for work up. And has barrium swallow test coming up. She is having belching a lot and is having constipation also. For mouth sores she has been taking Lysine as advised but this has not gotten better. Patient has been told so far with GI issues that they not sure what is going on at this time.  Patient Active Problem List   Diagnosis Date Noted  . Allergic rhinitis 07/02/2014  . Diabetes ([place]) 07/02/2014  . Genital herpes 07/02/2014  . HLD (hyperlipidemia) 07/02/2014  . Adult hypothyroidism 07/02/2014  . Arthritis, degenerative 07/02/2014  . Adiposity 07/02/2014  . Obstructive apnea 07/02/2014  . Avitaminosis D 07/02/2014  . Tendonitis, deQuervains 11/07/2010  . Mucous cyst of toe 11/07/2010  . MEDIAL MENISCUS TEAR, LEFT 06/29/2008  . KNEE PAIN, LEFT 06/01/2008  . TRIGGER FINGER, THUMB 04/20/2008  . AODM 02/03/2008  . ESSENTIAL HYPERTENSION, BENIGN 02/03/2008  . ACHILLES BURSITIS OR TENDINITIS 02/03/2008  . 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 TALIPES CAVUS 02/03/2008    Past Medical History:  Diagnosis Date  . Arthritis    right shoulder blade, left thumb  . Diabetes mellitus without complication (Home)   . GERD (gastroesophageal reflux disease)   . Heart murmur    followed by PCP  . High cholesterol   . Hypertension   . Sleep apnea     Social History   Social History  . Marital  status: Single    Spouse name: N/A  . Number of children: 1  . Years of education: master's   Occupational History  .  [employer]   Social History Main Topics  . Smoking status: Never Smoker  . Smokeless tobacco: Never Used  . Alcohol use Yes     Comment: maybe 1 drink once a month  . Drug use: No  . Sexual activity: Not on file   Other Topics Concern  . Not on file   Social History Narrative  . No narrative on file    Outpatient Encounter Prescriptions as of 10/31/2016  Medication Sig Note  . amLODipine (NORVASC) 10 MG tablet take 1 tablet by mouth once daily   . aspirin 325 MG tablet Take 325 mg by mouth daily.     Marland Kitchen BIOTIN 5000 PO Take by mouth daily.   . calcium carbonate (OS-CAL) 600 MG TABS tablet Take by mouth. 07/02/2014: Received from: [facility]  . cholecalciferol (VITAMIN D) 1000 UNITS tablet Take 5,000 Units by mouth once a week.    . empagliflozin (JARDIANCE) 25 MG TABS tablet Take 25 mg by mouth daily.   . furosemide (LASIX) 40 MG tablet Take 40 mg by mouth daily.   Marland Kitchen glucosamine-chondroitin (MAX GLUCOSAMINE CHONDROITIN) 500-400 MG tablet Take 1 tablet by mouth 3 (three) times daily.   Marland Kitchen  hydrALAZINE (APRESOLINE) 25 MG tablet take 1 tablet by mouth twice a day   . Liraglutide (VICTOZA) 18 MG/3ML SOPN INJECT 1.8MG  UNDER THE SKIN ONCE DAILY AS DIRECTED 09/20/2014: Received from: [facility]  . losartan-hydrochlorothiazide (HYZAAR) 100-25 MG tablet take 1 tablet by mouth once daily   . Lysine 1000 MG TABS Take by mouth daily.   . metFORMIN (GLUCOPHAGE) 1000 MG tablet Take 1,000 mg by mouth 2 (two) times daily with a meal.  07/02/2014: Received from: [facility]  . metoprolol (TOPROL-XL) 200 MG 24 hr tablet take 1 tablet by mouth once daily   . montelukast (SINGULAIR) 10 MG tablet Take 1 tablet (10 mg total) by mouth daily.   . Multiple Vitamin (MULTIVITAMIN) tablet Take 1 tablet by mouth daily.      . Multiple Vitamins-Minerals (HAIR SKIN AND NAILS FORMULA PO) Take by mouth.   . naproxen (NAPROSYN) 500 MG tablet Take 1 tablet (500 mg total) by mouth 2 (two) times daily as needed.   . pantoprazole (PROTONIX) 40 MG tablet Take 1 tablet (40 mg total) by mouth every morning.   . phentermine 30 MG capsule Take 30 mg by mouth every morning.   . rosuvastatin (CRESTOR) 20 MG tablet TAKE 1 TABLET BY MOUTH AT BEDTIME   . valACYclovir (VALTREX) 500 MG tablet take 1 tablet by mouth twice a day if needed   . vitamin B-12 (CYANOCOBALAMIN) 1000 MCG tablet Take 1,000 mcg by mouth daily.   . [DISCONTINUED] oxyCODONE-acetaminophen (ROXICET) 5-325 MG tablet Take 1-2 tablets by mouth every 4 (four) hours as needed.   . [DISCONTINUED] pioglitazone (ACTOS) 15 MG tablet Take 15 mg by mouth daily. Patient takes every other day right now per endocrinologist. 07/02/2014: Received from: [facility]  . [DISCONTINUED] XIFAXAN 550 MG TABS tablet Take 1 tablet by mouth 3 (three) times daily.    No facility-administered encounter medications on file as of 10/31/2016.     Allergies  Allergen Reactions  . Tape     Most tapes cause redness.  Paper tape and tegaderm are OK.    Review of Systems  Constitutional: Negative.   HENT: Negative.        Mouth sores.  Eyes: Negative.   Respiratory: Negative.   Cardiovascular: Positive for leg swelling. Negative for chest pain and palpitations.  Gastrointestinal: Positive for constipation.       Belching  Musculoskeletal: Positive for back pain and joint pain.  Skin: Negative.   Neurological:       Numbness in her hands at times.  Endo/Heme/Allergies: Negative.   Psychiatric/Behavioral: Negative.     Objective:  BP 120/62   Pulse 76   Temp 98.5 F (36.9 C)   Resp 14   Wt 190 lb 6.4 oz (86.4 kg)   BMI 29.82 kg/m   Physical Exam  Constitutional: She is oriented to person, place, and time and well-developed, well-nourished, and in no distress.    Cardiovascular: Normal rate, regular rhythm, normal heart sounds and intact distal pulses.  Exam reveals no gallop.   No murmur heard. Pulmonary/Chest: Effort normal and breath sounds normal. She has no wheezes.  Musculoskeletal: She exhibits no edema or tenderness.  Neurological: She is alert and oriented to person, place, and time.   Assessment and Plan :  1. Essential hypertension, benign Better. Continue current medication  2. Type 2 diabetes mellitus with complication, with long-term current use of insulin (HCC) Follows Dr Eddie Dibbles  3. Mouth sores Advised patient  that after GI work up if symptoms are still present and not explainable will need referral to ENT at that time. Advised patient that taking Naproxen can make GI issues worse, advised patient to stop.  4. Belching Try Carafate. Continue other medications. - sucralfate (CARAFATE) 1 g tablet; Take 1 tablet (1 g total) by mouth 4 (four) times daily -  with meals and at bedtime.  Dispense: 120 tablet; Refill: 5  5. Need for immunization against influenza - Flu vaccine HIGH DOSE PF (Fluzone High dose)  6. Gastroesophageal reflux disease, esophagitis presence not specified Continue work up with GI. 7.Obesity  HPI, Exam and A&P transcribed by Tiffany Kocher, RMA under direction and in the presence of Miguel Aschoff, MD. I have done the exam and reviewed the chart and it is accurate to the best of my knowledge. Development worker, community has been used and  any errors in dictation or transcription are unintentional. Miguel Aschoff M.D. [facility]

## 2016-11-02 ENCOUNTER — Ambulatory Visit
Admission: RE | Admit: 2016-11-02 | Discharge: 2016-11-02 | Disposition: A | Discharge status: Home / Self Care | Payer: BLUE CROSS/BLUE SHIELD | Source: Ambulatory Visit | Attending: Student | Admitting: Student

## 2016-11-02 DIAGNOSIS — K5730 Diverticulosis of large intestine without perforation or abscess without bleeding: Secondary | ICD-10-CM | POA: Insufficient documentation

## 2016-11-02 DIAGNOSIS — K219 Gastro-esophageal reflux disease without esophagitis: Secondary | ICD-10-CM | POA: Diagnosis not present

## 2016-11-02 DIAGNOSIS — K2970 Gastritis, unspecified, without bleeding: Secondary | ICD-10-CM

## 2016-11-02 DIAGNOSIS — K2100 Gastro-esophageal reflux disease with esophagitis, without bleeding: Secondary | ICD-10-CM

## 2016-11-02 DIAGNOSIS — J3089 Other allergic rhinitis: Secondary | ICD-10-CM | POA: Diagnosis not present

## 2016-11-02 DIAGNOSIS — R140 Abdominal distension (gaseous): Secondary | ICD-10-CM | POA: Diagnosis present

## 2016-11-02 DIAGNOSIS — R141 Gas pain: Secondary | ICD-10-CM

## 2016-11-02 DIAGNOSIS — J301 Allergic rhinitis due to pollen: Secondary | ICD-10-CM | POA: Diagnosis not present

## 2016-11-06 DIAGNOSIS — J3089 Other allergic rhinitis: Secondary | ICD-10-CM | POA: Diagnosis not present

## 2016-11-06 DIAGNOSIS — J301 Allergic rhinitis due to pollen: Secondary | ICD-10-CM | POA: Diagnosis not present

## 2016-11-13 DIAGNOSIS — J301 Allergic rhinitis due to pollen: Secondary | ICD-10-CM | POA: Diagnosis not present

## 2016-11-13 DIAGNOSIS — J3089 Other allergic rhinitis: Secondary | ICD-10-CM | POA: Diagnosis not present

## 2016-11-19 ENCOUNTER — Other Ambulatory Visit: Payer: Self-pay | Admitting: Family Medicine

## 2016-11-20 DIAGNOSIS — J3089 Other allergic rhinitis: Secondary | ICD-10-CM | POA: Diagnosis not present

## 2016-11-20 DIAGNOSIS — J301 Allergic rhinitis due to pollen: Secondary | ICD-10-CM | POA: Diagnosis not present

## 2016-11-27 DIAGNOSIS — J301 Allergic rhinitis due to pollen: Secondary | ICD-10-CM | POA: Diagnosis not present

## 2016-11-27 DIAGNOSIS — J3089 Other allergic rhinitis: Secondary | ICD-10-CM | POA: Diagnosis not present

## 2016-12-04 DIAGNOSIS — J301 Allergic rhinitis due to pollen: Secondary | ICD-10-CM | POA: Diagnosis not present

## 2016-12-04 DIAGNOSIS — J3089 Other allergic rhinitis: Secondary | ICD-10-CM | POA: Diagnosis not present

## 2016-12-06 ENCOUNTER — Encounter: Payer: Self-pay | Admitting: Physician Assistant

## 2016-12-06 ENCOUNTER — Ambulatory Visit (INDEPENDENT_AMBULATORY_CARE_PROVIDER_SITE_OTHER): Payer: BLUE CROSS/BLUE SHIELD | Admitting: Physician Assistant

## 2016-12-06 VITALS — BP 138/62 | HR 72 | Temp 98.6°F | Resp 16 | Wt 191.0 lb

## 2016-12-06 DIAGNOSIS — M79605 Pain in left leg: Secondary | ICD-10-CM | POA: Diagnosis not present

## 2016-12-06 DIAGNOSIS — G4730 Sleep apnea, unspecified: Secondary | ICD-10-CM | POA: Diagnosis not present

## 2016-12-06 MED ORDER — TRAMADOL HCL 50 MG PO TABS: 50.0000 mg | ORAL_TABLET | Freq: Two times a day (BID) | ORAL | 0 refills | Status: DC | PRN

## 2016-12-06 NOTE — Patient Instructions (Signed)
Leg Cramps Leg cramps occur when a muscle or muscles tighten and you have no control over this tightening (involuntary muscle contraction). Muscle cramps can develop in any muscle, but the most common place is in the calf muscles of the leg. Those cramps can occur during exercise or when you are at rest. Leg cramps are painful, and they may last for a few seconds to a few minutes. Cramps may return several times before they finally stop. Usually, leg cramps are not caused by a serious medical problem. In many cases, the cause is not known. Some common causes include:  Overexertion.  Overuse from repetitive motions, or doing the same thing over and over.  Remaining in a certain position for a long period of time.  Improper preparation, form, or technique while performing a sport or an activity.  Dehydration.  Injury.  Side effects of some medicines.  Abnormally low levels of the salts and ions in your blood (electrolytes), especially potassium and calcium. These levels could be low if you are taking water pills (diuretics) or if you are pregnant.  Follow these instructions at home: Watch your condition for any changes. Taking the following actions may help to lessen any discomfort that you are feeling:  Stay well-hydrated. Drink enough fluid to keep your urine clear or pale yellow.  Try massaging, stretching, and relaxing the affected muscle. Do this for several minutes at a time.  For tight or tense muscles, use a warm towel, heating pad, or hot shower water directed to the affected area.  If you are sore or have pain after a cramp, applying ice to the affected area may relieve discomfort. ? Put ice in a plastic bag. ? Place a towel between your skin and the bag. ? Leave the ice on for 20 minutes, 2-3 times per day.  Avoid strenuous exercise for several days if you have been having frequent leg cramps.  Make sure that your diet includes the essential minerals for your muscles to  work normally.  Take medicines only as directed by your health care provider.  Contact a health care provider if:  Your leg cramps get more severe or more frequent, or they do not improve over time.  Your foot becomes cold, numb, or blue. This information is not intended to replace advice given to you by your health care provider. Make sure you discuss any questions you have with your health care provider. Document Released: 03/01/2004 Document Revised: 06/30/2015 Document Reviewed: 12/30/2013 Elsevier Interactive Patient Education  2018 [employer]  

## 2016-12-06 NOTE — Progress Notes (Signed)
Patient: Cindy Hester Female    DOB: 05/18/1945   71 y.o.   MRN: [id number] Visit Date: 12/06/2016  Today's Provider: Trinna Post, PA-C   Chief Complaint  Patient presents with  . Leg Pain    Left leg started about two weeks ago.   Subjective:    Cindy Hester is a 71 y/o woman with history of left meniscal tear and history of gastritis on EGD presenting today with left leg pain x 2 weeks. Says pain originates in back of left knee joint and spreads both up and down. Describes moderate pain that increases with walking and decreases with rest. Denies burning pain. Says at the end of the day, her leg is so weak she has to lift it into the car. No left knee joint pain.   Leg Pain   The incident occurred more than 1 week ago. There was no injury mechanism. The pain is present in the left leg and left knee. The quality of the pain is described as aching. The pain has been constant since onset. Pertinent negatives include no inability to bear weight, loss of motion, loss of sensation, muscle weakness, numbness or tingling. She reports no foreign bodies present. The symptoms are aggravated by weight bearing. She has tried acetaminophen for the symptoms. The treatment provided no relief.       Allergies  Allergen Reactions  . Tape     Most tapes cause redness.  Paper tape and tegaderm are OK.     Current Outpatient Prescriptions:  .  amLODipine (NORVASC) 10 MG tablet, take 1 tablet by mouth once daily, Disp: 90 tablet, Rfl: 3 .  aspirin 325 MG tablet, Take 325 mg by mouth daily.  , Disp: , Rfl:  .  BIOTIN 5000 PO, Take by mouth daily., Disp: , Rfl:  .  calcium carbonate (OS-CAL) 600 MG TABS tablet, Take by mouth., Disp: , Rfl:  .  cholecalciferol (VITAMIN D) 1000 UNITS tablet, Take 5,000 Units by mouth once a week. , Disp: , Rfl:  .  empagliflozin (JARDIANCE) 25 MG TABS tablet, Take 25 mg by mouth daily., Disp: , Rfl:  .  furosemide (LASIX) 40 MG tablet, Take 40 mg by mouth  daily., Disp: , Rfl:  .  glucosamine-chondroitin (MAX GLUCOSAMINE CHONDROITIN) 500-400 MG tablet, Take 1 tablet by mouth 3 (three) times daily., Disp: 30 tablet, Rfl: 0 .  hydrALAZINE (APRESOLINE) 25 MG tablet, take 1 tablet by mouth twice a day, Disp: 60 tablet, Rfl: 12 .  Liraglutide (VICTOZA) 18 MG/3ML SOPN, INJECT 1.8MG  UNDER THE SKIN ONCE DAILY AS DIRECTED, Disp: , Rfl:  .  losartan-hydrochlorothiazide (HYZAAR) 100-25 MG tablet, take 1 tablet by mouth once daily, Disp: 30 tablet, Rfl: 12 .  Lysine 1000 MG TABS, Take by mouth daily., Disp: , Rfl:  .  metFORMIN (GLUCOPHAGE) 1000 MG tablet, Take 1,000 mg by mouth 2 (two) times daily with a meal. , Disp: , Rfl:  .  metoprolol (TOPROL-XL) 200 MG 24 hr tablet, take 1 tablet by mouth once daily, Disp: 30 tablet, Rfl: 12 .  montelukast (SINGULAIR) 10 MG tablet, Take 1 tablet (10 mg total) by mouth daily., Disp: 90 tablet, Rfl: 3 .  Multiple Vitamin (MULTIVITAMIN) tablet, Take 1 tablet by mouth daily.  , Disp: , Rfl:  .  Multiple Vitamins-Minerals (HAIR SKIN AND NAILS FORMULA PO), Take by mouth., Disp: , Rfl:  .  pantoprazole (PROTONIX) 40 MG tablet, Take 1 tablet (  40 mg total) by mouth every morning., Disp: 30 tablet, Rfl: 12 .  phentermine 30 MG capsule, Take 30 mg by mouth every morning., Disp: , Rfl:  .  rosuvastatin (CRESTOR) 20 MG tablet, TAKE 1 TABLET BY MOUTH AT BEDTIME, Disp: 90 tablet, Rfl: 3 .  sucralfate (CARAFATE) 1 g tablet, Take 1 tablet (1 g total) by mouth 4 (four) times daily -  with meals and at bedtime., Disp: 120 tablet, Rfl: 5 .  valACYclovir (VALTREX) 500 MG tablet, take 1 tablet by mouth twice a day if needed, Disp: 60 tablet, Rfl: 3 .  vitamin B-12 (CYANOCOBALAMIN) 1000 MCG tablet, Take 1,000 mcg by mouth daily., Disp: , Rfl:   Review of Systems  Constitutional: Negative.   Musculoskeletal: Positive for arthralgias, gait problem and myalgias. Negative for back pain, joint swelling, neck pain and neck stiffness.    Neurological: Negative for tingling and numbness.    Social History  Substance Use Topics  . Smoking status: Never Smoker  . Smokeless tobacco: Never Used  . Alcohol use Yes     Comment: maybe 1 drink once a month   Objective:   BP 138/62 (BP Location: Left Arm, Patient Position: Sitting, Cuff Size: Large)   Pulse 72   Temp 98.6 F (37 C) (Oral)   Resp 16   Wt 191 lb (86.6 kg)   BMI 29.91 kg/m  Vitals:   12/06/16 0850  BP: 138/62  Pulse: 72  Resp: 16  Temp: 98.6 F (37 C)  TempSrc: Oral  Weight: 191 lb (86.6 kg)     Physical Exam  Constitutional: She is oriented to person, place, and time. She appears well-developed and well-nourished.  Musculoskeletal: Normal range of motion. She exhibits no edema, tenderness or deformity.       Right hip: Normal.       Left hip: Normal.       Right knee: Normal.       Left knee: Normal.       Right ankle: Normal.       Left ankle: Normal.  Neurological: She is alert and oriented to person, place, and time. She has normal strength and normal reflexes. No sensory deficit. Coordination normal.  Skin: Skin is warm and dry.  Psychiatric: She has a normal mood and affect. Her behavior is normal.        Assessment & Plan:     1. Left leg pain  Exam benign, no knee joint instability, marked weakness or muscle atrophy. No unilateral swelling, leg redness indicating DVT. Not consistent with claudication. She says she needs something to make her walk in two weeks down the aisle for a wedding. She says she is having a lot of pain. She has been using 3g tylenol QD. She has gastritis and so NSAIDs are not option. Had long discussion about pain relief not addressing root cause and how I think she needs PT for likely muscle weakness and fatigue. Will give tramadol 2/2 gastritis. Take only PRN for severe pain. No driving. Do not take immediately before bed 2/2 sleep apnea.   - Ambulatory referral to Physical Therapy - traMADol (ULTRAM) 50 MG  tablet; Take 1 tablet (50 mg total) by mouth 2 (two) times daily as needed for moderate pain.  Dispense: 10 tablet; Refill: 0  Return if symptoms worsen or fail to improve.  The entirety of the information documented in the History of Present Illness, Review of Systems and Physical Exam were personally obtained by me.  Portions of this information were initially documented by Ashley Royalty, CMA and reviewed by me for thoroughness and accuracy.        Trinna Post, PA-C  [facility]

## 2016-12-11 DIAGNOSIS — J301 Allergic rhinitis due to pollen: Secondary | ICD-10-CM | POA: Diagnosis not present

## 2016-12-11 DIAGNOSIS — J3089 Other allergic rhinitis: Secondary | ICD-10-CM | POA: Diagnosis not present

## 2016-12-13 DIAGNOSIS — Z124 Encounter for screening for malignant neoplasm of cervix: Secondary | ICD-10-CM | POA: Diagnosis not present

## 2016-12-13 DIAGNOSIS — Z01419 Encounter for gynecological examination (general) (routine) without abnormal findings: Secondary | ICD-10-CM | POA: Diagnosis not present

## 2016-12-17 ENCOUNTER — Encounter: Payer: Self-pay | Admitting: Gastroenterology

## 2016-12-17 ENCOUNTER — Ambulatory Visit: Payer: BC Managed Care – PPO | Admitting: Gastroenterology

## 2016-12-17 ENCOUNTER — Telehealth: Payer: Self-pay

## 2016-12-17 VITALS — BP 140/64 | HR 76 | Ht 66.14 in | Wt 187.1 lb

## 2016-12-17 DIAGNOSIS — R6881 Early satiety: Secondary | ICD-10-CM

## 2016-12-17 DIAGNOSIS — R142 Eructation: Secondary | ICD-10-CM | POA: Diagnosis not present

## 2016-12-17 DIAGNOSIS — R935 Abnormal findings on diagnostic imaging of other abdominal regions, including retroperitoneum: Secondary | ICD-10-CM

## 2016-12-17 DIAGNOSIS — K5900 Constipation, unspecified: Secondary | ICD-10-CM | POA: Diagnosis not present

## 2016-12-17 MED ORDER — AMBULATORY NON FORMULARY MEDICATION: 0 refills | Status: DC

## 2016-12-17 MED ORDER — ASPIRIN 81 MG PO TABS: 81.0000 mg | ORAL_TABLET | Freq: Every day | ORAL | Status: DC

## 2016-12-17 MED ORDER — POLYETHYLENE GLYCOL 3350 17 GM/SCOOP PO POWD: 1.0000 | Freq: Two times a day (BID) | ORAL | 3 refills | Status: DC

## 2016-12-17 NOTE — Telephone Encounter (Signed)
Pt having EGD on 12-24-16 at 1:30pm.  On Phentermine.  Per Dr. Hilarie Fredrickson OK to hold for 8 days (vs typical 10) until procedure.

## 2016-12-17 NOTE — Progress Notes (Signed)
HPI :  71 year old female with a history of diabetes, GERD, SIBO, referred here by Dr. Miguel Aschoff for a second opinion regarding ongoing bowel symptoms.   She reports multiple symptoms for she has been evaluated for the past, most recently by the [facility].   She endorses belching but has been bothering her for the past year. She reports this bothers her all day and she also bothers her at night. She has a history of reflux but denies much of any heartburn, no regurgitation, no chest pain. She does endorse early satiety, no nausea but does have occasional postprandial vomiting. She denies much of any upper abdominal pain. She eats dinner about 8:30 at night and goes to bed around 11:30 PM. She's been taking Protonix 40 mg twice daily which has not appeared to help her belching too much. She denies any dysphagia or odynophagia  She reports she previously had diarrhea which bothered her significantly. She had a workup which was remarkable for a positive hydrogen breath test this past February. She was treated with a course of rifaximin which appears to have resolved her diarrhea. She now complains of constipation having a bowel movement every few days and passing hard stools. She also complains of flatulence and bloating. She denies any blood in her stools. She denies a family history of colon cancer, Crohn disease, or celiac disease. She has been taking a regular aspirin a day in addition to the history of ibuprofen use as needed. She previously was using Naprosyn but stopped this.  Last hemoglobin A1c is 7.2.   Her workup in the recent past as as outlined below. She had mild esophagitis and gastritis on an EGD about a year ago. Prior H. pylori testing negative. Was recently she had a small bowel follow-through in September which showed "thickening of the folds of her stomach". Last colonoscopy in 2012 was normal.   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 Arthritis    right shoulder blade, left thumb  . Diabetes mellitus without complication ([place])   . GERD (gastroesophageal reflux disease)   . Heart murmur    followed by PCP  . High cholesterol   . History of esophagitis   . History of gastritis   . HLD (hyperlipidemia)   . Hypertension   . Sleep apnea    CPAP     Past Surgical History:  Procedure Laterality Date  . BREAST CYST EXCISION Left    removed two times  . CESAREAN SECTION  1986  . CYST EXCISION     from left breast  . HEEL SPUR SURGERY Right 2011  . NASAL SINUS SURGERY    . TONSILLECTOMY     Family History  Problem Relation Age of Onset  . Stroke Mother   . Diabetes Mother   . Hypertension Mother   . Congestive Heart Failure Father   . Hypertension Father   . CAD Father   . Glaucoma Father   . Hyperlipidemia Brother   . Breast cancer Paternal Aunt   . Breast cancer Paternal Aunt   . Diabetes Brother   . Heart disease Brother    Social History  Tobacco Use  . Smoking status: Never Smoker  . Smokeless tobacco: Never Used  Substance Use Topics  . Alcohol use: Yes    Comment: maybe 1 drink once a month  . Drug use: No   Current Outpatient Medications  Medication Sig Dispense Refill  . amLODipine (NORVASC) 10 MG tablet take 1 tablet by mouth once daily 90 tablet 3  . aspirin 325 MG tablet Take 325 mg by mouth daily.      Marland Kitchen BIOTIN 5000 PO Take by mouth daily.    . calcium carbonate (OS-CAL) 600 MG TABS tablet Take by mouth.    . cholecalciferol (VITAMIN D) 1000 UNITS tablet Take 5,000 Units by mouth once a week.     . empagliflozin (JARDIANCE) 25 MG TABS tablet Take 25 mg by  mouth daily.    . furosemide (LASIX) 40 MG tablet Take 40 mg by mouth daily.    Marland Kitchen glucosamine-chondroitin (MAX GLUCOSAMINE CHONDROITIN) 500-400 MG tablet Take 1 tablet by mouth 3 (three) times daily. 30 tablet 0  . hydrALAZINE (APRESOLINE) 25 MG tablet take 1 tablet by mouth twice a day 60 tablet 12  . Liraglutide (VICTOZA) 18 MG/3ML SOPN INJECT 1.8MG  UNDER THE SKIN ONCE DAILY AS DIRECTED    . losartan-hydrochlorothiazide (HYZAAR) 100-25 MG tablet take 1 tablet by mouth once daily 30 tablet 12  . Lysine 1000 MG TABS Take by mouth daily.    . metFORMIN (GLUCOPHAGE) 1000 MG tablet Take 1,000 mg by mouth 2 (two) times daily with a meal.     . metoprolol (TOPROL-XL) 200 MG 24 hr tablet take 1 tablet by mouth once daily 30 tablet 12  . montelukast (SINGULAIR) 10 MG tablet Take 1 tablet (10 mg total) by mouth daily. 90 tablet 3  . Multiple Vitamin (MULTIVITAMIN) tablet Take 1 tablet by mouth daily.      . Multiple Vitamins-Minerals (HAIR SKIN AND NAILS FORMULA PO) Take by mouth.    . pantoprazole (PROTONIX) 40 MG tablet Take 1 tablet (40 mg total) by mouth every morning. 30 tablet 12  . phentermine 30 MG capsule Take 30 mg by mouth every morning.    . rosuvastatin (CRESTOR) 20 MG tablet TAKE 1 TABLET BY MOUTH AT BEDTIME 90 tablet 3  . valACYclovir (VALTREX) 500 MG tablet take 1 tablet by mouth twice a day if needed 60 tablet 3  . vitamin B-12 (CYANOCOBALAMIN) 1000 MCG tablet Take 1,000 mcg by mouth daily.    . sucralfate (CARAFATE) 1 g tablet Take 1 tablet (1 g total) by mouth 4 (four) times daily -  with meals and at bedtime. (Patient not taking: Reported on 12/17/2016) 120 tablet 5   No current facility-administered medications for this visit.    Allergies  Allergen Reactions  . Tape     Most tapes cause redness.  Paper tape and tegaderm are OK.     Review of Systems: All systems reviewed and negative except where noted in HPI.   Lab Results  Component Value Date   WBC 7.2 04/25/2016    HGB 12.5 04/25/2016   HCT 38.7 04/25/2016   MCV 81 04/25/2016   PLT 348 04/25/2016    Lab Results  Component Value Date   CREATININE 0.8 09/11/2016   BUN 15 09/11/2016   NA 142 09/11/2016   K 4.1 09/11/2016   CL 102 04/25/2016   CO2 25 04/25/2016    Lab Results  Component Value Date   ALT 22 04/25/2016   AST 22 04/25/2016  ALKPHOS 50 04/25/2016   BILITOT 0.4 04/25/2016     Physical Exam: BP 140/64 (BP Location: Left Arm, Patient Position: Sitting, Cuff Size: Large)   Pulse 76   Ht 5' 6.14" (1.68 m) Comment: height measured without shoes  Wt 187 lb 2 oz (84.9 kg)   BMI 30.07 kg/m  Constitutional: Pleasant,well-developed, female in no acute distress. HEENT: Normocephalic and atraumatic. Conjunctivae are normal. No scleral icterus. Neck supple.  Cardiovascular: Normal rate, regular rhythm.  Pulmonary/chest: Effort normal and breath sounds normal. No wheezing, rales or rhonchi. Abdominal: Soft, nondistended, nontender.  There are no masses palpable. No hepatomegaly. Extremities: no edema Lymphadenopathy: No cervical adenopathy noted. Neurological: Alert and oriented to person place and time. Skin: Skin is warm and dry. No rashes noted. Psychiatric: Normal mood and affect. Behavior is normal.   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 It's possible this may represent supra gastric belching/aerophagia and I discussed this at length with her. However in light of her early satiety and imaging of her stomach recently on barium study, I offered her repeat EGD to ensure OK. She has a history of gastritis and uses a regular aspirin daily, with ongoing NSAID use. I don't see a clear indication for regular dose aspirin,  she should talk with her primary care to see if she can reduce this to 81 mg a day for preventative purposes. Hopefully minimizing NSAID use may help her upper tract symptoms. I discussed risks and benefits of endoscopy with her and she wanted to proceed. Otherwise, given her diabetes and postprandial belching and early satiety I recommend a gastric empty study to rule out gastroparesis. If EGD and gastric imaging study are negative, may consider a trial of baclofen for belching or consider biofeedback therapy if thought to be supra gastric belching etiology. In the interim will give her a trial of FD gard to treat component of dyspepsia. She agreed. Of note during EGD I will biopsy for celiac disease with think this is less likely the etiology. Of note, she will hold phentermine now for upcoming endoscopy.  Constipation - diarrhea resolved following treatment for SIBO. Recommend MiraLAX once to twice daily as needed for constipation. Colonoscopy done in 2012 was normal. If no improvement she can contact me for reassessment.  All questions answered, she agreed with the plan.  [place] Cellar, MD [facility] Pager [phone]  CC: Jerrol Banana.,*

## 2016-12-17 NOTE — Patient Instructions (Addendum)
If you are age 69 or older, your body mass index should be between 23-30. Your Body mass index is 30.07 kg/m. If this is out of the aforementioned range listed, please consider follow up with your Primary Care Provider.  If you are age 58 or younger, your body mass index should be between 19-25. Your Body mass index is 30.07 kg/m. If this is out of the aformentioned range listed, please consider follow up with your Primary Care Provider.   You have been scheduled for an endoscopy. Please follow written instructions given to you at your visit today. If you use inhalers (even only as needed), please bring them with you on the day of your procedure. Your physician has requested that you go to www.startemmi.com and enter the access code given to you at your visit today. This web site gives a general overview about your procedure. However, you should still follow specific instructions given to you by our office regarding your preparation for the procedure.  Please increase your Miralax to twice a day.  Decrease your aspirin to 81 mg once a day.  We have given you samples of FDgard.  Take up to 3 times a day. These are over the counter so if they are helpful you can purchase them at the pharmacy.  You have been scheduled for a gastric emptying scan at [facility] on _________ at __________. Please arrive at least 15 minutes prior to your appointment for registration. Please make certain not to have anything to eat or drink after midnight the night before your test. Hold all stomach medications (ex: Zofran, phenergan, Reglan) 48 hours prior to your test. If you need to reschedule your appointment, please contact radiology scheduling at [phone]. _____________________________________________________________________ A gastric-emptying study measures how long it takes for food to move through your stomach. There are several ways to measure stomach emptying. In the most common test, you eat food  that contains a small amount of radioactive material. A scanner that detects the movement of the radioactive material is placed over your abdomen to monitor the rate at which food leaves your stomach. This test normally takes about 4 hours to complete. _____________________________________________________________________  Thank you.

## 2016-12-17 NOTE — Telephone Encounter (Signed)
Scheduled pt for Gastric Emptying Scan at [facility] on Friday, 12-28-16 on Friday. Arrive at 7:15am.  NPO after midnight.Spoke to pt. She expressed understanding.

## 2016-12-18 ENCOUNTER — Other Ambulatory Visit: Payer: Self-pay | Admitting: Obstetrics and Gynecology

## 2016-12-18 DIAGNOSIS — Z1231 Encounter for screening mammogram for malignant neoplasm of breast: Secondary | ICD-10-CM

## 2016-12-18 DIAGNOSIS — J3089 Other allergic rhinitis: Secondary | ICD-10-CM | POA: Diagnosis not present

## 2016-12-18 DIAGNOSIS — J301 Allergic rhinitis due to pollen: Secondary | ICD-10-CM | POA: Diagnosis not present

## 2016-12-20 DIAGNOSIS — G4730 Sleep apnea, unspecified: Secondary | ICD-10-CM | POA: Diagnosis not present

## 2016-12-24 ENCOUNTER — Encounter: Payer: Self-pay | Admitting: Gastroenterology

## 2016-12-24 ENCOUNTER — Other Ambulatory Visit: Payer: Self-pay

## 2016-12-24 ENCOUNTER — Ambulatory Visit (AMBULATORY_SURGERY_CENTER): Payer: BLUE CROSS/BLUE SHIELD | Admitting: Gastroenterology

## 2016-12-24 VITALS — BP 130/56 | HR 61 | Temp 97.1°F | Resp 14 | Ht 66.0 in | Wt 187.0 lb

## 2016-12-24 DIAGNOSIS — R933 Abnormal findings on diagnostic imaging of other parts of digestive tract: Secondary | ICD-10-CM

## 2016-12-24 DIAGNOSIS — R140 Abdominal distension (gaseous): Secondary | ICD-10-CM | POA: Diagnosis not present

## 2016-12-24 DIAGNOSIS — R142 Eructation: Secondary | ICD-10-CM | POA: Diagnosis not present

## 2016-12-24 DIAGNOSIS — R6881 Early satiety: Secondary | ICD-10-CM | POA: Diagnosis not present

## 2016-12-24 DIAGNOSIS — K2950 Unspecified chronic gastritis without bleeding: Secondary | ICD-10-CM | POA: Diagnosis not present

## 2016-12-24 MED ORDER — SODIUM CHLORIDE 0.9 % IV SOLN
500.0000 mL | INTRAVENOUS | Status: DC
Start: 2016-12-24 — End: 2016-12-24

## 2016-12-24 NOTE — Patient Instructions (Addendum)
YOU HAD AN ENDOSCOPIC PROCEDURE TODAY AT [facility]:   Refer to the procedure report that was given to you for any specific questions about what was found during the examination.  If the procedure report does not answer your questions, please call your gastroenterologist to clarify.  If you requested that your care partner not be given the details of your procedure findings, then the procedure report has been included in a sealed envelope for you to review at your convenience later.  YOU SHOULD EXPECT: Some feelings of bloating in the abdomen. Passage of more gas than usual.  Walking can help get rid of the air that was put into your GI tract during the procedure and reduce the bloating. If you had a lower endoscopy (such as a colonoscopy or flexible sigmoidoscopy) you may notice spotting of blood in your stool or on the toilet paper. If you underwent a bowel prep for your procedure, you may not have a normal bowel movement for a few days.  Please Note:  You might notice some irritation and congestion in your nose or some drainage.  This is from the oxygen used during your procedure.  There is no need for concern and it should clear up in a day or so.  SYMPTOMS TO REPORT IMMEDIATELY:    Following upper endoscopy (EGD)  Vomiting of blood or coffee ground material  New chest pain or pain under the shoulder blades  Painful or persistently difficult swallowing  New shortness of breath  Fever of 100F or higher  Black, tarry-looking stools  For urgent or emergent issues, a gastroenterologist can be reached at any hour by calling [phone].   DIET:  We do recommend a small meal at first, but then you may proceed to your regular diet.  Drink plenty of fluids but you should avoid alcoholic beverages for 24 hours.  ACTIVITY:  You should plan to take it easy for the rest of today and you should NOT DRIVE or use heavy machinery until tomorrow (because of the sedation medicines used  during the test).    FOLLOW UP: Our staff will call the number listed on your records the next business day following your procedure to check on you and address any questions or concerns that you may have regarding the information given to you following your procedure. If we do not reach you, we will leave a message.  However, if you are feeling well and you are not experiencing any problems, there is no need to return our call.  We will assume that you have returned to your regular daily activities without incident.  If any biopsies were taken you will be contacted by phone or by letter within the next 1-3 weeks.  Please call us at [phone] if you have not heard about the biopsies in 3 weeks.    SIGNATURES/CONFIDENTIALITY: You and/or your care partner have signed paperwork which will be entered into your electronic medical record.  These signatures attest to the fact that that the information above on your After Visit Summary has been reviewed and is understood.  Full responsibility of the confidentiality of this discharge information lies with you and/or your care-partner.     You may resume your current medications today. Await gastric emptying study which is already scheduled for later this week. Await biopsy results. Your blood sugar was 99 in the recovery room. Please call if any questions or concerns.

## 2016-12-24 NOTE — Progress Notes (Signed)
Called to room to assist during endoscopic procedure.  Patient ID and intended procedure confirmed with present staff. Received instructions for my participation in the procedure from the performing physician.  

## 2016-12-24 NOTE — Progress Notes (Signed)
To recovery, report to RN, VSS. 

## 2016-12-24 NOTE — Progress Notes (Signed)
No problems noted in the recovery room. Maw   Pt asked what instructions were needed for the gastric emptying study.  A print out of instructions were given to pt. Went over the instructions with pt and she had no questions. [facility] 12-28-16 at 7:30 arriving at 7:15 NPO, no stomach meds for 48 hours prior to test.,  maw

## 2016-12-24 NOTE — Op Note (Addendum)
[place] Patient Name: Cindy Hester Procedure Date: 12/24/2016 1:49 PM MRN: [id number] Endoscopist: Remo Lipps P. Armbruster MD, MD Age: 71 Referring MD:  Date of Birth: 05/08/1945 Gender: Female Account #: [id number] Procedure:                Upper GI endoscopy Indications:              Abnormal UGI series, Early satiety, persistent                            belching Medicines:                Monitored Anesthesia Care Procedure:                Pre-Anesthesia Assessment:                           - Prior to the procedure, a History and Physical                            was performed, and patient medications and                            allergies were reviewed. [name] tolerance of                            previous anesthesia was also reviewed. The risks                            and benefits of the procedure and the sedation                            options and risks were discussed with the patient.                            All questions were answered, and informed consent                            was obtained. Prior Anticoagulants: The patient has                            taken no previous anticoagulant or antiplatelet                            agents. ASA Grade Assessment: II - A patient with                            mild systemic disease. After reviewing the risks                            and benefits, the patient was deemed in                            satisfactory condition to undergo the procedure.  After obtaining informed consent, the endoscope was                            passed under direct vision. Throughout the                            procedure, the patient's blood pressure, pulse, and                            oxygen saturations were monitored continuously. The                            Endoscope was introduced through the mouth, and                            advanced to the second part of duodenum.  The upper                            GI endoscopy was accomplished without difficulty.                            The patient tolerated the procedure well. Scope In: Scope Out: Findings:                 Esophagogastric landmarks were identified: the                            Z-line was found at 35 cm, the gastroesophageal                            junction was found at 35 cm and the upper extent of                            the gastric folds was found at 35 cm from the                            incisors. No hiatal hernia.                           The exam of the esophagus was otherwise normal.                           The entire examined stomach was normal. Biopsies                            were taken from the antrum, body, and incisura with                            a cold forceps for Helicobacter pylori testing.                           The duodenal bulb and second portion of the  duodenum were normal. Biopsies for histology were                            taken with a cold forceps for evaluation of celiac                            disease. Complications:            No immediate complications. Estimated blood loss:                            Minimal. Estimated Blood Loss:     Estimated blood loss was minimal. Impression:               - Esophagogastric landmarks identified.                           - Normal esophagus.                           - Normal stomach. Biopsied to rule out H pylori.                           - Normal duodenal bulb and second portion of the                            duodenum. Biopsied to rule out celiac disease.                           Of note, it was intended that gastric and duodenal                            biopsy specimens go into separate jars, but they                            were accidentally placed in the same jar. Recommendation:           - Patient has a contact number available for                             emergencies. The signs and symptoms of potential                            delayed complications were discussed with the                            patient. Return to normal activities tomorrow.                            Written discharge instructions were provided to the                            patient.                           - Resume previous diet.                           -  Continue present medications.                           - Await pathology results.                           - Await gastric emptying study which is scheduled                            for later this week Remo Lipps P. Armbruster MD, MD 12/24/2016 2:41:32 PM This report has been signed electronically.

## 2016-12-25 ENCOUNTER — Telehealth: Payer: Self-pay | Admitting: *Deleted

## 2016-12-25 DIAGNOSIS — J3089 Other allergic rhinitis: Secondary | ICD-10-CM | POA: Diagnosis not present

## 2016-12-25 NOTE — Telephone Encounter (Signed)
  Follow up Call-  Call back number 12/24/2016  Post procedure Call Back phone  # [phone]  Permission to leave phone message Yes  Some recent data might be hidden     Patient questions:  Message left to call us if necessary.

## 2016-12-26 ENCOUNTER — Ambulatory Visit: Payer: BLUE CROSS/BLUE SHIELD | Attending: Physician Assistant

## 2016-12-26 VITALS — BP 164/47 | HR 70

## 2016-12-26 DIAGNOSIS — M6281 Muscle weakness (generalized): Secondary | ICD-10-CM | POA: Diagnosis not present

## 2016-12-26 DIAGNOSIS — M25562 Pain in left knee: Secondary | ICD-10-CM

## 2016-12-26 DIAGNOSIS — M79605 Pain in left leg: Secondary | ICD-10-CM | POA: Insufficient documentation

## 2016-12-26 DIAGNOSIS — M79652 Pain in left thigh: Secondary | ICD-10-CM | POA: Diagnosis not present

## 2016-12-26 DIAGNOSIS — R262 Difficulty in walking, not elsewhere classified: Secondary | ICD-10-CM

## 2016-12-26 NOTE — Therapy (Signed)
[facility] [address], [place], [address] Phone: [phone]   Fax:  [phone]  Physical Therapy Evaluation  Patient Details  Name: Cindy Hester MRN: [id number] Date of Birth: [birth year] Referring Provider: Carles Collet, PA-C   Encounter Date: 12/26/2016  PT End of Session - 12/26/16 1640    Visit Number  1    Number of Visits  13    Date for PT Re-Evaluation  02/07/17    Authorization Type  1    Authorization Time Period  of 30 insurance    PT Start Time  2355 pt arrived late    PT Stop Time  1741    PT Time Calculation (min)  60 min    Activity Tolerance  Patient tolerated treatment well    Behavior During Therapy  [facility] for tasks assessed/performed       Past Medical History:  Diagnosis Date  . Arthritis    right shoulder blade, left thumb  . Diabetes mellitus without complication ([place])   . GERD (gastroesophageal reflux disease)   . Heart murmur    followed by PCP  . High cholesterol   . History of esophagitis   . History of gastritis   . HLD (hyperlipidemia)   . Hypertension   . Sleep apnea    CPAP    Past Surgical History:  Procedure Laterality Date  . BREAST CYST EXCISION Left    removed two times  . CESAREAN SECTION  1986  . CYST EXCISION     from left breast  . ESOPHAGOGASTRODUODENOSCOPY (EGD) WITH PROPOFOL N/A 12/12/2015   gastritis, LA Grade A reflux esophagitis ESOPHAGOGASTRODUODENOSCOPY (EGD) WITH PROPOFOL;  Surgeon: Manya Silvas, MD;  Location: [facility];  Service: Endoscopy;  Laterality: N/A;  Diabetic  . HAMMER TOE SURGERY Bilateral 05/30/2016   Procedure: HAMMER TOE CORRECTION RIGHT 2ND, 3RD, 4TH, 5TH TOES LEFT 5TH TOE;  Surgeon: Samara Deist, DPM;  Location: [facility];  Service: Podiatry;  Laterality: Bilateral;  . HEEL SPUR SURGERY Right 2011  . NASAL SINUS SURGERY    . TONSILLECTOMY      Vitals:   12/26/16 1647  BP: (!) 164/47  Pulse: 70      Subjective Assessment - 12/26/16 1650    Subjective  R posterior thigh/knee/leg: 0/10 currently (sitting), 8.5/10 L knee pain at most for the past 2 months    Pertinent History  L posterior leg pain. Not sure exactly when her pain began. Pain however has progressively worsened since Labor day (September 2018). Has not injured her L knee, back, and no fall.  Tore her L knee meniscus several years ago.  Denies loss of bowel or bladder control. No unexplained changes in weight. No LE paresthesias. Just bilateral hand tingling at times.  Her PA-C did not think her pain was due to a DVT and also did not think it was a sciatic nerve.      Patient Stated Goals  "I would like to know why my L knee is doing this." Pt also states wanting to work on balance.     Currently in Pain?  No/denies    Pain Score  0-No pain pt sitting    Pain Location  -- L posterior thigh, knee and leg pain    Pain Orientation  Left    Pain Descriptors / Indicators  Aching    Pain Type  Acute pain    Pain Onset  More than a  month ago    Pain Frequency  Occasional    Aggravating Factors   up on her feet, moving around. Feels heavy at the end of the day and has a difficult time bringing her L LE into her car. Walking bothers it.     Pain Relieving Factors  Sitting on a chair, sitting on her recliner         [facility] PT Assessment - 12/26/16 1658      Assessment   Medical Diagnosis  L leg pain    Referring Provider  Carles Collet, PA-C    Onset Date/Surgical Date  10/08/16 pain worsened Labor Day    Prior Therapy  No known PT for current condition       Precautions   Precaution Comments  No known precautions      Restrictions   Other Position/Activity Restrictions  No known restrictions      Balance Screen   Has the patient fallen in the past 6 months  No    Has the patient had a decrease in activity level because of a fear of falling?   No    Is the patient reluctant to leave their home because of a fear of  falling?   No      Home Environment   Additional Comments  Pt lives in a 2 story home alone. 1 step to enter. About 15 steps inside with L rail.       Prior Function   Vocation  Full time employment Scientist, research (physical sciences)  PLOF: Less difficulty walking, negotiating stairs, and getting into and out of her car    Leisure  going out to eat with friends      Observation/Other Assessments   Observations  (-) Slump test bilateral LE. (-) varus, valgus stress tests, lachman's, (-) poosterior drawer test, L knee. Decreased bilateral femoral control ascending and descending steps R > L.   No pain with L calf squeeze.       Posture/Postural Control   Posture Comments  protracted neck, bilaterally protracted shoulders, slight L lateral shift posture, slight L foot pronation.       AROM   Lumbar Flexion  full, no pain    Lumbar Extension  WFL    Lumbar - Right Side Bend  [facility]     Lumbar - Left Side Bend  WFL    Lumbar - Right Rotation  [facility]    Lumbar - Left Rotation  [facility]      Strength   Right Hip Flexion  4/5    Right Hip Extension  4-/5    Right Hip ABduction  4/5    Left Hip Flexion  4/5    Left Hip Extension  4-/5    Left Hip ABduction  4-/5    Right Knee Flexion  5/5    Right Knee Extension  5/5    Left Knee Flexion  4+/5    Left Knee Extension  5/5      Palpation   Palpation comment  TTP with reproduction of symptoms to L tibial nerve. Slight puffiness L posterior knee.        Ambulation/Gait   Gait Comments  antalgic, decreased stance L LE, R pelvic drop during L LE stance phase             Objective measurements completed on examination: See above findings.   Vitals obtained, 164/47. Denies lightheadedness or dizziness. Pt states having an emotional afternoon. Pt  states having R Achilles tendon surgery around 3 years ago. Also had surgery both feet removing bones and putting pins on her toes (1 pin in L little toe and 4 pins in R smaller toes) on  05/30/2016. Pt states that her L LE feels heavy  Pt also states wanting to work on balance.  No pain with L calf squeeze.   Ther-ex  Directed pt with S/L L hip abduction 10x. Reviewed and given as part pf her HEP. Pt demonstrated and verbalized understanding.    Improved exercise technique, movement at target joints, use of target muscles after mod verbal, visual, tactile cues.   Pt was recommended to use ice for the back of her knee 15 minutes at a time 3-4 times a day to see if it helps. Pt verbalized understanding.          PT Education - 12/26/16 1944    Education provided  Yes    Education Details  ther-ex, HEP, Plan of care    Person(s) Educated  Patient    Methods  Explanation;Demonstration;Tactile cues;Verbal cues;Handout    Comprehension  Verbalized understanding;Returned demonstration          PT Long Term Goals - 12/26/16 1921      PT LONG TERM GOAL #1   Title  Pt will have a decrease in L posterior thigh/knee/leg pain to 4/10 or less at worst to promote ability to ambulate, perform standing tasks.     Baseline  8.5/10 at worst for the past 2 months (12/26/2016)    Time  6    Period  Weeks    Status  New    Target Date  02/07/17      PT LONG TERM GOAL #2   Title  Patient will improve bilateral glute med and max strength by at least 1/2 MMT grade to promote ability to ambulate, negotiate stairs with less L LE pain.     Time  6    Period  Weeks    Status  New    Target Date  02/07/17      PT LONG TERM GOAL #3   Title  Pt will report less feeling of heaviness of her L LE at the end of the day to promote ability to [facility] and get into and out of her car.     Baseline  Pt states L LE feels heavy at the end of the day (12/26/2016)    Time  6    Period  Weeks    Status  New    Target Date  02/07/17             Plan - 12/26/16 1902    Clinical Impression Statement  Pt is a 71 year old female who came to physical therapy secondary to L leg pain. She  also demonstrates altered gait pattern and posture, bilateral hip weakness, decreased bilateral femoral control, slight puffiness posterior L knee compared to her R, reproduction of symptoms with palpation to her tibial nerve, and difficulty performing functional tasks such as walking and getting her L LE into the car at the end of the day. Pt will benefit from skilled physical therapy to address the aforementioned deficits.     History and Personal Factors relevant to plan of care:  weakness, pain, hx of L medial meniscal injury    Clinical Presentation  Stable    Clinical Presentation due to:  Pain is the same since onset based on medical screening form  Clinical Decision Making  Low    Rehab Potential  Fair    Clinical Impairments Affecting Rehab Potential  Hx of L medial miniscal injury; pain    PT Frequency  2x / week    PT Duration  6 weeks    PT Treatment/Interventions  Therapeutic exercise;Therapeutic activities;Neuromuscular re-education;Manual techniques;Aquatic Therapy;Electrical Stimulation;Iontophoresis 4mg /ml Dexamethasone;Ultrasound;Gait training;Stair training;Patient/family education;Dry needling    PT Next Visit Plan  hip strengthening, femoral control, modalities PRN    Consulted and Agree with Plan of Care  Patient       Patient will benefit from skilled therapeutic intervention in order to improve the following deficits and impairments:  Pain, Improper body mechanics, Difficulty walking, Decreased strength  Visit Diagnosis: Pain in left leg - Plan: PT plan of care cert/re-cert  Left knee pain, unspecified chronicity - Plan: PT plan of care cert/re-cert  Pain in left thigh - Plan: PT plan of care cert/re-cert  Muscle weakness (generalized) - Plan: PT plan of care cert/re-cert  Difficulty in walking, not elsewhere classified - Plan: PT plan of care cert/re-cert     Problem List Patient Active Problem List   Diagnosis Date Noted  . Allergic rhinitis 07/02/2014   . Diabetes ([place]) 07/02/2014  . Genital herpes 07/02/2014  . HLD (hyperlipidemia) 07/02/2014  . Adult hypothyroidism 07/02/2014  . Arthritis, degenerative 07/02/2014  . Adiposity 07/02/2014  . Obstructive apnea 07/02/2014  . Avitaminosis D 07/02/2014  . Tendonitis, deQuervains 11/07/2010  . Mucous cyst of toe 11/07/2010  . MEDIAL MENISCUS TEAR, LEFT 06/29/2008  . KNEE PAIN, LEFT 06/01/2008  . TRIGGER FINGER, THUMB 04/20/2008  . AODM 02/03/2008  . ESSENTIAL HYPERTENSION, BENIGN 02/03/2008  . ACHILLES BURSITIS OR TENDINITIS 02/03/2008  . 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 TALIPES CAVUS 02/03/2008    Joneen Boers PT, DPT   12/26/2016, 7:52 PM  [facility] [facility] [address] [place], [address] Phone: [phone]   Fax:  [phone]  Name: Cindy Hester MRN: [id number] Date of Birth: [birth year]

## 2016-12-26 NOTE — Patient Instructions (Signed)
  Strengthening: Hip Abduction (Side-Lying)     Tighten muscles on front of left thigh, then lift leg  from surface, keeping knee locked.  Repeat _10___ times per set. Do _2___ sets per session. Do _1___ sessions per day.  http://orth.exer.us/622   Copyright  VHI. All rights reserved.

## 2016-12-28 ENCOUNTER — Ambulatory Visit (HOSPITAL_COMMUNITY)
Admission: RE | Admit: 2016-12-28 | Discharge: 2016-12-28 | Disposition: A | Discharge status: Home / Self Care | Payer: BLUE CROSS/BLUE SHIELD | Source: Ambulatory Visit | Attending: Gastroenterology | Admitting: Gastroenterology

## 2016-12-28 DIAGNOSIS — R142 Eructation: Secondary | ICD-10-CM

## 2016-12-28 DIAGNOSIS — R112 Nausea with vomiting, unspecified: Secondary | ICD-10-CM | POA: Diagnosis not present

## 2016-12-28 DIAGNOSIS — K5900 Constipation, unspecified: Secondary | ICD-10-CM | POA: Diagnosis not present

## 2016-12-28 DIAGNOSIS — R6881 Early satiety: Secondary | ICD-10-CM | POA: Diagnosis not present

## 2016-12-28 DIAGNOSIS — R935 Abnormal findings on diagnostic imaging of other abdominal regions, including retroperitoneum: Secondary | ICD-10-CM

## 2016-12-28 MED ORDER — TECHNETIUM TC 99M SULFUR COLLOID
2.0400 | Freq: Once | INTRAVENOUS | Status: AC | PRN
  Administered 2016-12-28: 2.04 via INTRAVENOUS

## 2016-12-31 ENCOUNTER — Ambulatory Visit: Payer: BLUE CROSS/BLUE SHIELD

## 2016-12-31 DIAGNOSIS — M79605 Pain in left leg: Secondary | ICD-10-CM

## 2016-12-31 DIAGNOSIS — M79652 Pain in left thigh: Secondary | ICD-10-CM

## 2016-12-31 DIAGNOSIS — R262 Difficulty in walking, not elsewhere classified: Secondary | ICD-10-CM | POA: Diagnosis not present

## 2016-12-31 DIAGNOSIS — M6281 Muscle weakness (generalized): Secondary | ICD-10-CM

## 2016-12-31 DIAGNOSIS — M25562 Pain in left knee: Secondary | ICD-10-CM | POA: Diagnosis not present

## 2016-12-31 NOTE — Therapy (Signed)
[place] [address] [place], [address] Phone: [phone]   Fax:  [phone]  Physical Therapy Treatment  Patient Details  Name: Cindy Hester MRN: [id number] Date of Birth: [birth year] Referring Provider: Carles Collet, PA-C   Encounter Date: 12/31/2016  PT End of Session - 12/31/16 1708    Visit Number  2    Number of Visits  13    Date for PT Re-Evaluation  02/07/17    Authorization Type  2    Authorization Time Period  of 30 insurance    PT Start Time  1708    PT Stop Time  1752    PT Time Calculation (min)  44 min    Activity Tolerance  Patient tolerated treatment well    Behavior During Therapy  [facility] for tasks assessed/performed       Past Medical History:  Diagnosis Date  . Arthritis    right shoulder blade, left thumb  . Diabetes mellitus without complication ([place])   . GERD (gastroesophageal reflux disease)   . Heart murmur    followed by PCP  . High cholesterol   . History of esophagitis   . History of gastritis   . HLD (hyperlipidemia)   . Hypertension   . Sleep apnea    CPAP    Past Surgical History:  Procedure Laterality Date  . BREAST CYST EXCISION Left    removed two times  . CESAREAN SECTION  1986  . CYST EXCISION     from left breast  . ESOPHAGOGASTRODUODENOSCOPY (EGD) WITH PROPOFOL N/A 12/12/2015   gastritis, LA Grade A reflux esophagitis ESOPHAGOGASTRODUODENOSCOPY (EGD) WITH PROPOFOL;  Surgeon: Manya Silvas, MD;  Location: [place];  Service: Endoscopy;  Laterality: N/A;  Diabetic  . HAMMER TOE SURGERY Bilateral 05/30/2016   Procedure: HAMMER TOE CORRECTION RIGHT 2ND, 3RD, 4TH, 5TH TOES LEFT 5TH TOE;  Surgeon: Samara Deist, DPM;  Location: [place];  Service: Podiatry;  Laterality: Bilateral;  . HEEL SPUR SURGERY Right 2011  . NASAL SINUS SURGERY    . TONSILLECTOMY      There were no vitals filed for this visit.  Subjective Assessment -  12/31/16 1709    Subjective  Pt states that she was lazy and did not do her exercises. L leg feels ok today. Friday afternoon, pt went to a craft show and did a lot of walking which bothered her L LE. Much better if she stays off it.  No pain currently. Did not have a hard time bringing her L LE into her car today.     Pertinent History  L posterior leg pain. Not sure exactly when her pain began. Pain however has progressively worsened since Labor day (September 2018). Has not injured her L knee, back, and no fall.  Tore her L knee meniscus several years ago.  Denies loss of bowel or bladder control. No unexplained changes in weight. No LE paresthesias. Just bilateral hand tingling at times.  Her PA-C did not think her pain was due to a DVT and also did not think it was a sciatic nerve.      Patient Stated Goals  "I would like to know why my L knee is doing this." Pt also states wanting to work on balance.     Currently in Pain?  No/denies    Pain Score  0-No pain    Pain Onset  More than a month ago  PT Education - 12/31/16 1712    Education provided  Yes    Education Details  ther-ex    [employer]) Educated  Patient    Methods  Explanation;Demonstration;Tactile cues;Verbal cues    Comprehension  Returned demonstration;Verbalized understanding        Objectives    Ther-ex  Directed pt with R S/L L hip abduction 10x3  SLS on L LE with occasional light touch assist 10x5 seconds for 2 sets  Forward step up onto Air Ex pad with L LE, R UE assist, emphasis on femoral control 10x3  Side stepping 32 ft to the L and 32 ft to the R     Then with yellow band resisting hip abduction 15 ft to the L and 15 ft to the R. Good glute med muscle use felt by pt.   Forward step up onto 3 inch step with L LE 10x2 with mirror cues for decreasing pelvic drop. Difficulty preventing pelvic drop.   L LE static mini lunge. 10x, emphasis on femoral and pelvic  control. Slight L anterior knee discomfort which eases with rest.   Seated hip adductor/glute max ball squeeze 10x5 seconds for 2 sets  Standing ankle DF/PF on rocker board with bilateral UE assist 2 minutes to promote tissue mobility   Forward wedding march 32 ft x2 good L glute muscle use felt by pt.    Improved exercise technique, movement at target joints, use of target muscles after mod verbal, visual, tactile cues.   Worked on glute med muscle strengthening and femoral control to help promote control at her L knee joint when performing closed chain activities. Demonstrates decreased trunk control as well when performing standing exercises. Pt tolerated session well without aggravation of symptoms.     PT Long Term Goals - 12/26/16 1921      PT LONG TERM GOAL #1   Title  Pt will have a decrease in L posterior thigh/knee/leg pain to 4/10 or less at worst to promote ability to ambulate, perform standing tasks.     Baseline  8.5/10 at worst for the past 2 months (12/26/2016)    Time  6    Period  Weeks    Status  New    Target Date  02/07/17      PT LONG TERM GOAL #2   Title  Patient will improve bilateral glute med and max strength by at least 1/2 MMT grade to promote ability to ambulate, negotiate stairs with less L LE pain.     Time  6    Period  Weeks    Status  New    Target Date  02/07/17      PT LONG TERM GOAL #3   Title  Pt will report less feeling of heaviness of her L LE at the end of the day to promote ability to [facility] and get into and out of her car.     Baseline  Pt states L LE feels heavy at the end of the day (12/26/2016)    Time  6    Period  Weeks    Status  New    Target Date  02/07/17            Plan - 12/31/16 1713    Clinical Impression Statement  Worked on glute med muscle strengthening and femoral control to help promote control at her L knee joint when performing closed chain activities. Demonstrates decreased trunk control as well when  performing standing exercises. Pt  tolerated session well without aggravation of symptoms.     History and Personal Factors relevant to plan of care:   weakness, pain, hx of L medial meniscal injury     Clinical Presentation  Stable    Clinical Presentation due to:  pt tolerated session well without aggravation of symptoms    Clinical Decision Making  Low    Rehab Potential  Fair    Clinical Impairments Affecting Rehab Potential  Hx of L medial miniscal injury; pain    PT Frequency  2x / week    PT Duration  6 weeks    PT Treatment/Interventions  Therapeutic exercise;Therapeutic activities;Neuromuscular re-education;Manual techniques;Aquatic Therapy;Electrical Stimulation;Iontophoresis 4mg /ml Dexamethasone;Ultrasound;Gait training;Stair training;Patient/family education;Dry needling    PT Next Visit Plan  hip strengthening, femoral control, modalities PRN    Consulted and Agree with Plan of Care  Patient       Patient will benefit from skilled therapeutic intervention in order to improve the following deficits and impairments:  Pain, Improper body mechanics, Difficulty walking, Decreased strength  Visit Diagnosis: Left knee pain, unspecified chronicity  Pain in left thigh  Pain in left leg  Muscle weakness (generalized)  Difficulty in walking, not elsewhere classified     Problem List Patient Active Problem List   Diagnosis Date Noted  . Allergic rhinitis 07/02/2014  . Diabetes ([place]) 07/02/2014  . Genital herpes 07/02/2014  . HLD (hyperlipidemia) 07/02/2014  . Adult hypothyroidism 07/02/2014  . Arthritis, degenerative 07/02/2014  . Adiposity 07/02/2014  . Obstructive apnea 07/02/2014  . Avitaminosis D 07/02/2014  . Tendonitis, deQuervains 11/07/2010  . Mucous cyst of toe 11/07/2010  . MEDIAL MENISCUS TEAR, LEFT 06/29/2008  . KNEE PAIN, LEFT 06/01/2008  . TRIGGER FINGER, THUMB 04/20/2008  . AODM 02/03/2008  . ESSENTIAL HYPERTENSION, BENIGN 02/03/2008  . ACHILLES  BURSITIS OR TENDINITIS 02/03/2008  . 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 TALIPES CAVUS 02/03/2008    Joneen Boers PT, DPT   12/31/2016, 6:01 PM  [place] [facility] [address] [place], [address] Phone: [phone]   Fax:  [phone]  Name: Cindy Hester MRN: [id number] Date of Birth: 03-Jul-1945

## 2017-01-01 DIAGNOSIS — E785 Hyperlipidemia, unspecified: Secondary | ICD-10-CM | POA: Diagnosis not present

## 2017-01-01 DIAGNOSIS — J3089 Other allergic rhinitis: Secondary | ICD-10-CM | POA: Diagnosis not present

## 2017-01-01 DIAGNOSIS — J301 Allergic rhinitis due to pollen: Secondary | ICD-10-CM | POA: Diagnosis not present

## 2017-01-01 DIAGNOSIS — E781 Pure hyperglyceridemia: Secondary | ICD-10-CM | POA: Diagnosis not present

## 2017-01-01 DIAGNOSIS — E119 Type 2 diabetes mellitus without complications: Secondary | ICD-10-CM | POA: Diagnosis not present

## 2017-01-02 ENCOUNTER — Ambulatory Visit: Payer: BLUE CROSS/BLUE SHIELD

## 2017-01-02 ENCOUNTER — Other Ambulatory Visit: Payer: Self-pay | Admitting: Gastroenterology

## 2017-01-02 DIAGNOSIS — R262 Difficulty in walking, not elsewhere classified: Secondary | ICD-10-CM

## 2017-01-02 DIAGNOSIS — M79652 Pain in left thigh: Secondary | ICD-10-CM | POA: Diagnosis not present

## 2017-01-02 DIAGNOSIS — M25562 Pain in left knee: Secondary | ICD-10-CM

## 2017-01-02 DIAGNOSIS — M6281 Muscle weakness (generalized): Secondary | ICD-10-CM | POA: Diagnosis not present

## 2017-01-02 DIAGNOSIS — M79605 Pain in left leg: Secondary | ICD-10-CM

## 2017-01-02 DIAGNOSIS — R142 Eructation: Secondary | ICD-10-CM

## 2017-01-02 MED ORDER — BACLOFEN 10 MG PO TABS: 10.0000 mg | ORAL_TABLET | Freq: Three times a day (TID) | ORAL | 0 refills | Status: AC

## 2017-01-02 NOTE — Patient Instructions (Signed)
Pt was recommended to squeeze her rear end muscles every time she is on her L LE when walking. Pt demonstrated and verbalized understanding.

## 2017-01-02 NOTE — Therapy (Signed)
[facility] [address] [place], [address] Phone: [phone]   Fax:  [phone]  Physical Therapy Treatment  Patient Details  Name: Cindy Hester MRN: [id number] Date of Birth: [birth year] Referring Provider: Carles Collet, PA-C   Encounter Date: 01/02/2017  PT End of Session - 01/02/17 1801    Visit Number  3    Number of Visits  13    Date for PT Re-Evaluation  02/07/17    Authorization Type  3    Authorization Time Period  of 30 insurance    PT Start Time  1802 pt arrived late    PT Stop Time  1843    PT Time Calculation (min)  41 min    Activity Tolerance  Patient tolerated treatment well    Behavior During Therapy  [facility] for tasks assessed/performed       Past Medical History:  Diagnosis Date  . Arthritis    right shoulder blade, left thumb  . Diabetes mellitus without complication ([place])   . GERD (gastroesophageal reflux disease)   . Heart murmur    followed by PCP  . High cholesterol   . History of esophagitis   . History of gastritis   . HLD (hyperlipidemia)   . Hypertension   . Sleep apnea    CPAP    Past Surgical History:  Procedure Laterality Date  . BREAST CYST EXCISION Left    removed two times  . CESAREAN SECTION  1986  . CYST EXCISION     from left breast  . ESOPHAGOGASTRODUODENOSCOPY (EGD) WITH PROPOFOL N/A 12/12/2015   gastritis, LA Grade A reflux esophagitis ESOPHAGOGASTRODUODENOSCOPY (EGD) WITH PROPOFOL;  Surgeon: Manya Silvas, MD;  Location: [facility];  Service: Endoscopy;  Laterality: N/A;  Diabetic  . HAMMER TOE SURGERY Bilateral 05/30/2016   Procedure: HAMMER TOE CORRECTION RIGHT 2ND, 3RD, 4TH, 5TH TOES LEFT 5TH TOE;  Surgeon: Samara Deist, DPM;  Location: [facility];  Service: Podiatry;  Laterality: Bilateral;  . HEEL SPUR SURGERY Right 2011  . NASAL SINUS SURGERY    . TONSILLECTOMY      There were no vitals filed for this  visit.  Subjective Assessment - 01/02/17 1804    Subjective  L LE feels ok because she has been sitting on her computer all day. Walked a lot after last session, and her L LE bothered her.  Used ice Monday which helped.     Pertinent History  L posterior leg pain. Not sure exactly when her pain began. Pain however has progressively worsened since Labor day (September 2018). Has not injured her L knee, back, and no fall.  Tore her L knee meniscus several years ago.  Denies loss of bowel or bladder control. No unexplained changes in weight. No LE paresthesias. Just bilateral hand tingling at times.  Her PA-C did not think her pain was due to a DVT and also did not think it was a sciatic nerve.      Patient Stated Goals  "I would like to know why my L knee is doing this." Pt also states wanting to work on balance.     Currently in Pain?  No/denies    Pain Score  0-No pain    Pain Onset  More than a month ago  PT Education - 01/02/17 1822    Education provided  Yes    Education Details  ther-ex, HEP    Person(s) Educated  Patient    Methods  Explanation;Tactile cues;Verbal cues;Demonstration    Comprehension  Returned demonstration;Verbalized understanding         Objectives    Ther-ex  Directed pt with R S/L L hip abduction 10x3 secondary to pt not performing exercise at home.   Standing hip machine: L hip extension plate 55 for 08M   SLS on L LE with occasional light touch assist 10x5 seconds for 2 sets. Also cues for glute max squeeze to promote more neutral femoral position.  Gait with glute max squeeze during L LE heel strike to push-off 300 ft  Forward wedding march 32 ft x 4  Side stepping 32 ft to the L and 32 ft to the R                          Then with yellow band resisting hip abduction 15 ft to the L and 15 ft to the R.   Standing ankle DF/PF on rocker board with bilateral UE assist 2 minutes to promote tissue  mobility    L LE static mini lunge. 10x2, emphasis on femoral and pelvic control. No discomfort today.   Seated hip adductor/glute max ball squeeze 10x5 seconds for 2 sets  Slight L mini lunge static position with R hip abduction, R foot on slider,bilateral UE assist 10x2, emphasis on femoral control.     Improved exercise technique, movement at target joints, use of target muscles after min to mod verbal, visual, tactile cues.    No L LE pain after session. Pt states mostly muscle fatigue. Slight improvement in L femoral control with exercises observed compared to previous session.           PT Long Term Goals - 12/26/16 1921      PT LONG TERM GOAL #1   Title  Pt will have a decrease in L posterior thigh/knee/leg pain to 4/10 or less at worst to promote ability to ambulate, perform standing tasks.     Baseline  8.5/10 at worst for the past 2 months (12/26/2016)    Time  6    Period  Weeks    Status  New    Target Date  02/07/17      PT LONG TERM GOAL #2   Title  Patient will improve bilateral glute med and max strength by at least 1/2 MMT grade to promote ability to ambulate, negotiate stairs with less L LE pain.     Time  6    Period  Weeks    Status  New    Target Date  02/07/17      PT LONG TERM GOAL #3   Title  Pt will report less feeling of heaviness of her L LE at the end of the day to promote ability to [facility] and get into and out of her car.     Baseline  Pt states L LE feels heavy at the end of the day (12/26/2016)    Time  6    Period  Weeks    Status  New    Target Date  02/07/17            Plan - 01/02/17 1823    Clinical Impression Statement  No L LE pain after session. Pt states mostly muscle fatigue. Slight  improvement in L femoral control with exercises observed compared to previous session.     History and Personal Factors relevant to plan of care:  weakness, pain, hx of L medial meniscal injury      Clinical Presentation  Stable     Clinical Presentation due to:  Pt tolerated session well without aggravation of symptoms.     Clinical Decision Making  Low    Rehab Potential  Fair    Clinical Impairments Affecting Rehab Potential  Hx of L medial miniscal injury; pain    PT Frequency  2x / week    PT Duration  6 weeks    PT Treatment/Interventions  Therapeutic exercise;Therapeutic activities;Neuromuscular re-education;Manual techniques;Aquatic Therapy;Electrical Stimulation;Iontophoresis 4mg /ml Dexamethasone;Ultrasound;Gait training;Stair training;Patient/family education;Dry needling    PT Next Visit Plan  hip strengthening, femoral control, modalities PRN    Consulted and Agree with Plan of Care  Patient       Patient will benefit from skilled therapeutic intervention in order to improve the following deficits and impairments:  Pain, Improper body mechanics, Difficulty walking, Decreased strength  Visit Diagnosis: Left knee pain, unspecified chronicity  Pain in left thigh  Pain in left leg  Difficulty in walking, not elsewhere classified  Muscle weakness (generalized)     Problem List Patient Active Problem List   Diagnosis Date Noted  . Allergic rhinitis 07/02/2014  . Diabetes ([place]) 07/02/2014  . Genital herpes 07/02/2014  . HLD (hyperlipidemia) 07/02/2014  . Adult hypothyroidism 07/02/2014  . Arthritis, degenerative 07/02/2014  . Adiposity 07/02/2014  . Obstructive apnea 07/02/2014  . Avitaminosis D 07/02/2014  . Tendonitis, deQuervains 11/07/2010  . Mucous cyst of toe 11/07/2010  . MEDIAL MENISCUS TEAR, LEFT 06/29/2008  . KNEE PAIN, LEFT 06/01/2008  . TRIGGER FINGER, THUMB 04/20/2008  . AODM 02/03/2008  . ESSENTIAL HYPERTENSION, BENIGN 02/03/2008  . ACHILLES BURSITIS OR TENDINITIS 02/03/2008  . 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 TALIPES CAVUS 02/03/2008     Joneen Boers PT, DPT  01/02/2017, 6:51 PM  [facility] [address] [place], [address] Phone: [phone]   Fax:  [phone]  Name: LATICA HOHMANN MRN: [id number] Date of Birth: 10/28/1945

## 2017-01-07 ENCOUNTER — Ambulatory Visit: Payer: BLUE CROSS/BLUE SHIELD

## 2017-01-07 ENCOUNTER — Ambulatory Visit: Payer: BLUE CROSS/BLUE SHIELD | Admitting: Physician Assistant

## 2017-01-07 ENCOUNTER — Encounter: Payer: Self-pay | Admitting: Physician Assistant

## 2017-01-07 VITALS — BP 138/62 | HR 76 | Temp 99.3°F | Resp 16 | Wt 188.0 lb

## 2017-01-07 DIAGNOSIS — J069 Acute upper respiratory infection, unspecified: Secondary | ICD-10-CM

## 2017-01-07 DIAGNOSIS — B9789 Other viral agents as the cause of diseases classified elsewhere: Secondary | ICD-10-CM | POA: Diagnosis not present

## 2017-01-07 MED ORDER — BENZONATATE 100 MG PO CAPS: 100.0000 mg | ORAL_CAPSULE | Freq: Three times a day (TID) | ORAL | 0 refills | Status: AC | PRN

## 2017-01-07 NOTE — Patient Instructions (Signed)
Upper Respiratory Infection, Adult Most upper respiratory infections (URIs) are caused by a virus. A URI affects the nose, throat, and upper air passages. The most common type of URI is often called "the common cold." Follow these instructions at home:  Take medicines only as told by your doctor.  Gargle warm saltwater or take cough drops to comfort your throat as told by your doctor.  Use a warm mist humidifier or inhale steam from a shower to increase air moisture. This may make it easier to breathe.  Drink enough fluid to keep your pee (urine) clear or pale yellow.  Eat soups and other clear broths.  Have a healthy diet.  Rest as needed.  Go back to work when your fever is gone or your doctor says it is okay. ? You may need to stay home longer to avoid giving your URI to others. ? You can also wear a face mask and wash your hands often to prevent spread of the virus.  Use your inhaler more if you have asthma.  Do not use any tobacco products, including cigarettes, chewing tobacco, or electronic cigarettes. If you need help quitting, ask your doctor. Contact a doctor if:  You are getting worse, not better.  Your symptoms are not helped by medicine.  You have chills.  You are getting more short of breath.  You have brown or red mucus.  You have yellow or brown discharge from your nose.  You have pain in your face, especially when you bend forward.  You have a fever.  You have puffy (swollen) neck glands.  You have pain while swallowing.  You have white areas in the back of your throat. Get help right away if:  You have very bad or constant: ? Headache. ? Ear pain. ? Pain in your forehead, behind your eyes, and over your cheekbones (sinus pain). ? Chest pain.  You have long-lasting (chronic) lung disease and any of the following: ? Wheezing. ? Long-lasting cough. ? Coughing up blood. ? A change in your usual mucus.  You have a stiff neck.  You have  changes in your: ? Vision. ? Hearing. ? Thinking. ? Mood. This information is not intended to replace advice given to you by your health care provider. Make sure you discuss any questions you have with your health care provider. Document Released: 07/11/2007 Document Revised: 09/25/2015 Document Reviewed: 04/29/2013 Elsevier Interactive Patient Education  2018 Elsevier Inc.  

## 2017-01-07 NOTE — Progress Notes (Signed)
[place]  Chief Complaint  Patient presents with  . URI    Started about three days ago.    Subjective:    Patient ID: UNKNOWN FLANNIGAN, female    DOB: 08-20-1945, 71 y.o.   MRN: [id number]  Upper Respiratory Infection: Cindy Hester is a 71 y.o. female with a past medical history significant for allergic rhinitis, sinusitissymptoms of a URI, possible sinusitis. Symptoms include congestion, cough, sore throat and swollen glands. Onset of symptoms was 3 days ago, gradually worsening since that time. She also c/o congestion, non productive cough and post nasal drip for the past 3 days .  She is drinking plenty of fluids. Evaluation to date: none. Treatment to date: cough suppressants. The treatment has provided no.   Review of Systems  Constitutional: Positive for fatigue. Negative for activity change, appetite change, chills, diaphoresis and fever.  HENT: Positive for congestion, postnasal drip, rhinorrhea, sinus pressure, sinus pain, sore throat and voice change. Negative for ear discharge, ear pain, hearing loss, nosebleeds, sneezing, tinnitus and trouble swallowing.   Eyes: Negative.   Respiratory: Positive for cough and shortness of breath. Negative for apnea, choking, chest tightness, wheezing and stridor.   Gastrointestinal: Negative.   Musculoskeletal: Negative for neck pain and neck stiffness.  Neurological: Positive for headaches.       Objective:   BP 138/62 (BP Location: Left Arm, Patient Position: Sitting, Cuff Size: Normal)   Pulse 76   Temp 99.3 F (37.4 C) (Oral)   Resp 16   Wt 188 lb (85.3 kg)   SpO2 97%   BMI 30.34 kg/m   Patient Active Problem List   Diagnosis Date Noted  . Allergic rhinitis 07/02/2014  . Diabetes ([place]) 07/02/2014  . Genital herpes 07/02/2014  . HLD (hyperlipidemia) 07/02/2014  . Adult hypothyroidism 07/02/2014  . Arthritis, degenerative 07/02/2014  . Adiposity 07/02/2014  . Obstructive apnea  07/02/2014  . Avitaminosis D 07/02/2014  . Tendonitis, deQuervains 11/07/2010  . Mucous cyst of toe 11/07/2010  . MEDIAL MENISCUS TEAR, LEFT 06/29/2008  . KNEE PAIN, LEFT 06/01/2008  . TRIGGER FINGER, THUMB 04/20/2008  . AODM 02/03/2008  . ESSENTIAL HYPERTENSION, BENIGN 02/03/2008  . ACHILLES BURSITIS OR TENDINITIS 02/03/2008  . 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 TALIPES CAVUS 02/03/2008    Outpatient Encounter Medications as of 01/07/2017  Medication Sig  . AMBULATORY NON FORMULARY MEDICATION Medication Name: FDgard: Please up to 3 times a day as needed  . amLODipine (NORVASC) 10 MG tablet take 1 tablet by mouth once daily  . aspirin 81 MG tablet Take 1 tablet (81 mg total) daily by mouth.  . baclofen (LIORESAL) 10 MG tablet Take 1 tablet (10 mg total) by mouth 3 (three) times daily.  Marland Kitchen BIOTIN 5000 PO Take by mouth daily.  . calcium carbonate (OS-CAL) 600 MG TABS tablet Take by mouth.  . cholecalciferol (VITAMIN D) 1000 UNITS tablet Take 5,000 Units by mouth once a week.   . empagliflozin (JARDIANCE) 25 MG TABS tablet Take 25 mg by mouth daily.  . furosemide (LASIX) 40 MG tablet Take 40 mg by mouth daily.  Marland Kitchen glucosamine-chondroitin (MAX GLUCOSAMINE CHONDROITIN) 500-400 MG tablet Take 1 tablet by mouth 3 (three) times daily.  . hydrALAZINE (APRESOLINE) 25 MG tablet take 1 tablet by mouth twice a day  . Liraglutide (VICTOZA) 18 MG/3ML SOPN INJECT 1.8MG  UNDER THE SKIN ONCE DAILY AS DIRECTED  . losartan-hydrochlorothiazide (HYZAAR) 100-25 MG tablet take 1 tablet by mouth once  daily  . Lysine 1000 MG TABS Take by mouth daily.  . metFORMIN (GLUCOPHAGE) 1000 MG tablet Take 1,000 mg by mouth 2 (two) times daily with a meal.   . metoprolol (TOPROL-XL) 200 MG 24 hr tablet take 1 tablet by mouth once daily  . montelukast (SINGULAIR) 10 MG tablet Take 1 tablet (10 mg total) by mouth daily.  . Multiple Vitamin (MULTIVITAMIN) tablet Take 1 tablet by mouth daily.    . Multiple Vitamins-Minerals  (HAIR SKIN AND NAILS FORMULA PO) Take by mouth.  . pantoprazole (PROTONIX) 40 MG tablet Take 1 tablet (40 mg total) by mouth every morning.  . polyethylene glycol powder (GLYCOLAX/MIRALAX) powder Take 255 g 2 (two) times daily by mouth.  . rosuvastatin (CRESTOR) 20 MG tablet TAKE 1 TABLET BY MOUTH AT BEDTIME  . sucralfate (CARAFATE) 1 g tablet Take 1 tablet (1 g total) by mouth 4 (four) times daily -  with meals and at bedtime.  . valACYclovir (VALTREX) 500 MG tablet take 1 tablet by mouth twice a day if needed  . vitamin B-12 (CYANOCOBALAMIN) 1000 MCG tablet Take 1,000 mcg by mouth daily.  . phentermine 30 MG capsule Take 30 mg by mouth every morning.   No facility-administered encounter medications on file as of 01/07/2017.     Allergies  Allergen Reactions  . Tape     Most tapes cause redness.  Paper tape and tegaderm are OK.       Physical Exam  Constitutional: She is oriented to person, place, and time. She appears well-developed and well-nourished.  HENT:  Right Ear: Tympanic membrane and external ear normal.  Left Ear: Tympanic membrane and external ear normal.  Nose: Right sinus exhibits no maxillary sinus tenderness and no frontal sinus tenderness. Left sinus exhibits no maxillary sinus tenderness and no frontal sinus tenderness.  Mouth/Throat: Posterior oropharyngeal erythema present. No oropharyngeal exudate or posterior oropharyngeal edema.  Eyes: Conjunctivae are normal.  Cardiovascular: Normal rate and regular rhythm.  Pulmonary/Chest: Effort normal and breath sounds normal.  Lymphadenopathy:    She has cervical adenopathy.  Neurological: She is alert and oriented to person, place, and time.  Skin: Skin is warm and dry.  Psychiatric: She has a normal mood and affect. Her behavior is normal.       Assessment & Plan:  1. Viral URI with cough  May call back in one week if progresses to sinus infection, will send in abx.  - benzonatate (TESSALON PERLES) 100 MG  capsule; Take 1 capsule (100 mg total) by mouth 3 (three) times daily as needed for up to 7 days for cough.  Dispense: 21 capsule; Refill: 0  No Follow-up on file.  The entirety of the information documented in the History of Present Illness, Review of Systems and Physical Exam were personally obtained by me. Portions of this information were initially documented by Ashley Royalty, CMA and reviewed by me for thoroughness and accuracy.

## 2017-01-09 ENCOUNTER — Ambulatory Visit: Payer: BLUE CROSS/BLUE SHIELD

## 2017-01-10 DIAGNOSIS — E538 Deficiency of other specified B group vitamins: Secondary | ICD-10-CM | POA: Diagnosis not present

## 2017-01-10 DIAGNOSIS — E785 Hyperlipidemia, unspecified: Secondary | ICD-10-CM | POA: Diagnosis not present

## 2017-01-10 DIAGNOSIS — E781 Pure hyperglyceridemia: Secondary | ICD-10-CM | POA: Diagnosis not present

## 2017-01-10 DIAGNOSIS — E1165 Type 2 diabetes mellitus with hyperglycemia: Secondary | ICD-10-CM | POA: Diagnosis not present

## 2017-01-14 ENCOUNTER — Ambulatory Visit: Payer: BLUE CROSS/BLUE SHIELD

## 2017-01-16 ENCOUNTER — Telehealth: Payer: Self-pay | Admitting: Family Medicine

## 2017-01-16 DIAGNOSIS — J019 Acute sinusitis, unspecified: Secondary | ICD-10-CM

## 2017-01-16 MED ORDER — AMOXICILLIN-POT CLAVULANATE 875-125 MG PO TABS: 1.0000 | ORAL_TABLET | Freq: Two times a day (BID) | ORAL | 0 refills | Status: AC

## 2017-01-16 NOTE — Telephone Encounter (Signed)
Sent in Augmentin 875/125 BIG x 10 days.

## 2017-01-16 NOTE — Telephone Encounter (Signed)
Pt states she was seen a week ago for cough and congestion.  Pt states she still has a cough, having green coming from her nose and a lot of congestion. Pt is requesting something to help with the cough and the congestion.  [facility]  PU#[phone]/[place]

## 2017-01-16 NOTE — Telephone Encounter (Signed)
Left patient a message advising her that RX has been sent to pharmacy and call back with any questions or concerns.

## 2017-01-21 ENCOUNTER — Ambulatory Visit: Payer: BLUE CROSS/BLUE SHIELD | Attending: Physician Assistant

## 2017-01-21 DIAGNOSIS — M6281 Muscle weakness (generalized): Secondary | ICD-10-CM | POA: Diagnosis not present

## 2017-01-21 DIAGNOSIS — M25562 Pain in left knee: Secondary | ICD-10-CM

## 2017-01-21 DIAGNOSIS — M79652 Pain in left thigh: Secondary | ICD-10-CM

## 2017-01-21 DIAGNOSIS — M79605 Pain in left leg: Secondary | ICD-10-CM | POA: Diagnosis not present

## 2017-01-21 DIAGNOSIS — R262 Difficulty in walking, not elsewhere classified: Secondary | ICD-10-CM

## 2017-01-21 NOTE — Therapy (Signed)
[facility] [address], [place], [address] Phone: [phone]   Fax:  [phone]  Physical Therapy Treatment  Patient Details  Name: Cindy Hester MRN: [id number] Date of Birth: March 30, 1945 Referring Provider: Carles Collet, PA-C   Encounter Date: 01/21/2017  PT End of Session - 01/21/17 1759    Visit Number  4    Number of Visits  13    Date for PT Re-Evaluation  02/07/17    Authorization Type  4    Authorization Time Period  of 30 insurance    PT Start Time  1800 pt arrived late    PT Stop Time  1850    PT Time Calculation (min)  50 min    Activity Tolerance  Patient tolerated treatment well    Behavior During Therapy  [facility] for tasks assessed/performed       Past Medical History:  Diagnosis Date  . Arthritis    right shoulder blade, left thumb  . Diabetes mellitus without complication ([place])   . GERD (gastroesophageal reflux disease)   . Heart murmur    followed by PCP  . High cholesterol   . History of esophagitis   . History of gastritis   . HLD (hyperlipidemia)   . Hypertension   . Sleep apnea    CPAP    Past Surgical History:  Procedure Laterality Date  . BREAST CYST EXCISION Left    removed two times  . CESAREAN SECTION  1986  . CYST EXCISION     from left breast  . ESOPHAGOGASTRODUODENOSCOPY (EGD) WITH PROPOFOL N/A 12/12/2015   gastritis, LA Grade A reflux esophagitis ESOPHAGOGASTRODUODENOSCOPY (EGD) WITH PROPOFOL;  Surgeon: Manya Silvas, MD;  Location: [place];  Service: Endoscopy;  Laterality: N/A;  Diabetic  . HAMMER TOE SURGERY Bilateral 05/30/2016   Procedure: HAMMER TOE CORRECTION RIGHT 2ND, 3RD, 4TH, 5TH TOES LEFT 5TH TOE;  Surgeon: Samara Deist, DPM;  Location: [place];  Service: Podiatry;  Laterality: Bilateral;  . HEEL SPUR SURGERY Right 2011  . NASAL SINUS SURGERY    . TONSILLECTOMY      There were no vitals filed for this  visit.  Subjective Assessment - 01/21/17 1801    Subjective  Pt states that she is not feeling confident. Pt states L LE feels heavy after shopping. L knee hurt when bending it to bring her leg into the car.  The doctor does not know what is going on.   Pt denies osteopenia or osteoporosis.  Pt states L knee bothered her on the 45 minute drive here. Did not bother her when she got here.     Pertinent History  L posterior leg pain. Not sure exactly when her pain began. Pain however has progressively worsened since Labor day (September 2018). Has not injured her L knee, back, and no fall.  Tore her L knee meniscus several years ago.  Denies loss of bowel or bladder control. No unexplained changes in weight. No LE paresthesias. Just bilateral hand tingling at times.  Her PA-C did not think her pain was due to a DVT and also did not think it was a sciatic nerve.      Patient Stated Goals  "I would like to know why my L knee is doing this." Pt also states wanting to work on balance.     Currently in Pain?  Other (Comment) no mention of pain    Pain Onset  More than  a month ago         [facility] PT Assessment - 01/21/17 1824      Observation/Other Assessments   Observations  L SLR hip flexion test: reproduction of  L LE symptoms at 72 degrees flexion. SLR R hip flexion 75 degrees with posterior thigh pulling (decreases with R ankle PF)       Palpation   Palpation comment  No reproduction of symptoms with R and L UPA and central UPA to lumbar and lower thoracic spine. TTP L piriformis muscle area but no reproduction of symptoms.                          PT Education - 01/21/17 1828    Education provided  Yes    Education Details  ther-ex, femoral control, knee irritation, and nerve irritation; HEP    Person(s) Educated  Patient    Methods  Explanation;Demonstration;Tactile cues;Verbal cues;Handout    Comprehension  Returned demonstration;Verbalized understanding          Objectives  TTP L piriformis muscle area but no reproduction of symptoms  Pt states ice helps when her symptoms occur especially at the end of the day. Props her knee up with the leg rest of her recliner.     Ther-ex  Pt education on importance on performing HEP secondary to pt stating not doing her exercises at home.   Pt education on knee irritation, femoral control, hip strengthening, decreasing knee irritation to decrease inflammation to hopefully decrease pressure to tibial nerve. Increased time taken to address pt question. Knee model and visual pictures utilized.  Pt demonstrated and verbalized understanding.   Directed pt withRS/L L hip abduction 10x1  Supine SLR L hip flexion 10x then 5x    Pt felt R hamstring use   SLR PROM hip flexion R and L 1x each LE  Prone glute max/quad set 10x5 seconds each LE for 2 sets    Reviewed and given as part of her HEP. Pt demonstrated and verbalized understanding.   SLS on L LE with occasional light touch assist 10x5 seconds for 2 sets. Also cues for glute max squeeze to promote more neutral femoral position.  Check foot posture for possible arch support next visit if appropriate.    Improved exercise technique, movement at target joints, use of target muscles after min to mod verbal, visual, tactile cues.   No reproduction of L LE symptoms with UPA and central UPA to low back and upper thoracic spine and L posterior hip around the piriformis area. Continued working on [employer], and max strengthening to promote ability to perform femoral control and hopefully decrease irritation to L knee and pressure to nerve at the back of her knee. Pt tolerated session well without aggravation of symptoms.        PT Long Term Goals - 12/26/16 1921      PT LONG TERM GOAL #1   Title  Pt will have a decrease in L posterior thigh/knee/leg pain to 4/10 or less at worst to promote ability to ambulate, perform standing tasks.      Baseline  8.5/10 at worst for the past 2 months (12/26/2016)    Time  6    Period  Weeks    Status  New    Target Date  02/07/17      PT LONG TERM GOAL #2   Title  Patient will improve bilateral glute med and max strength by  at least 1/2 MMT grade to promote ability to ambulate, negotiate stairs with less L LE pain.     Time  6    Period  Weeks    Status  New    Target Date  02/07/17      PT LONG TERM GOAL #3   Title  Pt will report less feeling of heaviness of her L LE at the end of the day to promote ability to [facility] and get into and out of her car.     Baseline  Pt states L LE feels heavy at the end of the day (12/26/2016)    Time  6    Period  Weeks    Status  New    Target Date  02/07/17            Plan - 01/21/17 1902    Clinical Impression Statement  No reproduction of L LE symptoms with UPA and central UPA to low back and upper thoracic spine and L posterior hip around the piriformis area. Continued working on [employer], and max strengthening to promote ability to perform femoral control and hopefully decrease irritation to L knee and pressure to nerve at the back of her knee. Pt tolerated session well without aggravation of symptoms.     History and Personal Factors relevant to plan of care:  weakness, pain, hx of L medial meniscal injury    Clinical Presentation  Stable    Clinical Presentation due to:  Pt tolerated session well without aggravation of symptoms.     Clinical Decision Making  Low    Rehab Potential  Fair    Clinical Impairments Affecting Rehab Potential  Hx of L medial miniscal injury; pain    PT Frequency  2x / week    PT Duration  6 weeks    PT Treatment/Interventions  Therapeutic exercise;Therapeutic activities;Neuromuscular re-education;Manual techniques;Aquatic Therapy;Electrical Stimulation;Iontophoresis 4mg /ml Dexamethasone;Ultrasound;Gait training;Stair training;Patient/family education;Dry needling    PT Next Visit Plan  hip strengthening,  femoral control, modalities PRN    Consulted and Agree with Plan of Care  Patient       Patient will benefit from skilled therapeutic intervention in order to improve the following deficits and impairments:  Pain, Improper body mechanics, Difficulty walking, Decreased strength  Visit Diagnosis: Left knee pain, unspecified chronicity  Pain in left thigh  Pain in left leg  Difficulty in walking, not elsewhere classified  Muscle weakness (generalized)     Problem List Patient Active Problem List   Diagnosis Date Noted  . Allergic rhinitis 07/02/2014  . Diabetes ([place]) 07/02/2014  . Genital herpes 07/02/2014  . HLD (hyperlipidemia) 07/02/2014  . Adult hypothyroidism 07/02/2014  . Arthritis, degenerative 07/02/2014  . Adiposity 07/02/2014  . Obstructive apnea 07/02/2014  . Avitaminosis D 07/02/2014  . Tendonitis, deQuervains 11/07/2010  . Mucous cyst of toe 11/07/2010  . MEDIAL MENISCUS TEAR, LEFT 06/29/2008  . KNEE PAIN, LEFT 06/01/2008  . TRIGGER FINGER, THUMB 04/20/2008  . AODM 02/03/2008  . ESSENTIAL HYPERTENSION, BENIGN 02/03/2008  . ACHILLES BURSITIS OR TENDINITIS 02/03/2008  . 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 TALIPES CAVUS 02/03/2008   Joneen Boers PT, DPT   01/21/2017, 7:06 PM  [facility] [address] [place], [address] Phone: [phone]   Fax:  [phone]  Name: Cindy Hester MRN: [id number] Date of Birth: 01-03-1946

## 2017-01-21 NOTE — Patient Instructions (Addendum)
   Quadriceps Set (Prone)   Pillow under abdomen and ankles  With toes supporting lower legs, squeeze rear end muscles and tighten thigh muscles to straighten knees. Hold _5___ seconds. Relax. Repeat __10__ times per set. Do ___3_ sets per session. Do ___1_ sessions per day.  http://orth.exer.us/726   Copyright  VHI. All rights reserved.     Gave L LE SLS with UE assist with emphasis on pelvic control 10x3 with 5 second holds daily as part of her HEP. Pt demonstrated and verbalized understanding. Handout provided.

## 2017-01-22 DIAGNOSIS — J3089 Other allergic rhinitis: Secondary | ICD-10-CM | POA: Diagnosis not present

## 2017-01-22 DIAGNOSIS — J301 Allergic rhinitis due to pollen: Secondary | ICD-10-CM | POA: Diagnosis not present

## 2017-01-22 DIAGNOSIS — H1045 Other chronic allergic conjunctivitis: Secondary | ICD-10-CM | POA: Diagnosis not present

## 2017-01-23 ENCOUNTER — Ambulatory Visit: Payer: BLUE CROSS/BLUE SHIELD

## 2017-01-23 DIAGNOSIS — M79605 Pain in left leg: Secondary | ICD-10-CM

## 2017-01-23 DIAGNOSIS — M79652 Pain in left thigh: Secondary | ICD-10-CM | POA: Diagnosis not present

## 2017-01-23 DIAGNOSIS — M6281 Muscle weakness (generalized): Secondary | ICD-10-CM

## 2017-01-23 DIAGNOSIS — M25562 Pain in left knee: Secondary | ICD-10-CM | POA: Diagnosis not present

## 2017-01-23 DIAGNOSIS — R262 Difficulty in walking, not elsewhere classified: Secondary | ICD-10-CM | POA: Diagnosis not present

## 2017-01-23 NOTE — Therapy (Signed)
[facility] [address] [place], [address] Phone: [phone]   Fax:  [phone]  Physical Therapy Treatment  Patient Details  Name: Cindy Hester MRN: [id number] Date of Birth: [birth year] Referring Provider: Carles Collet, PA-C   Encounter Date: 01/23/2017  PT End of Session - 01/23/17 1758    Visit Number  5    Number of Visits  21    Date for PT Re-Evaluation  02/28/17    Authorization Type  5    Authorization Time Period  of 30 insurance    PT Start Time  0370    PT Stop Time  1853    PT Time Calculation (min)  55 min    Activity Tolerance  Patient tolerated treatment well    Behavior During Therapy  [facility] for tasks assessed/performed       Past Medical History:  Diagnosis Date  . Arthritis    right shoulder blade, left thumb  . Diabetes mellitus without complication ([place])   . GERD (gastroesophageal reflux disease)   . Heart murmur    followed by PCP  . High cholesterol   . History of esophagitis   . History of gastritis   . HLD (hyperlipidemia)   . Hypertension   . Sleep apnea    CPAP    Past Surgical History:  Procedure Laterality Date  . BREAST CYST EXCISION Left    removed two times  . CESAREAN SECTION  1986  . CYST EXCISION     from left breast  . ESOPHAGOGASTRODUODENOSCOPY (EGD) WITH PROPOFOL N/A 12/12/2015   gastritis, LA Grade A reflux esophagitis ESOPHAGOGASTRODUODENOSCOPY (EGD) WITH PROPOFOL;  Surgeon: Manya Silvas, MD;  Location: [place];  Service: Endoscopy;  Laterality: N/A;  Diabetic  . HAMMER TOE SURGERY Bilateral 05/30/2016   Procedure: HAMMER TOE CORRECTION RIGHT 2ND, 3RD, 4TH, 5TH TOES LEFT 5TH TOE;  Surgeon: Samara Deist, DPM;  Location: [place];  Service: Podiatry;  Laterality: Bilateral;  . HEEL SPUR SURGERY Right 2011  . NASAL SINUS SURGERY    . TONSILLECTOMY      There were no vitals filed for this visit.  Subjective Assessment -  01/23/17 1800    Subjective  Went to her allergist Tuesday morning and has not been taking her allergy medication. L LE is fine today. Has been sitting on her computer most of the day. Started bothering her after she left last session. Pt was walking around in the grocery store. No pain currently. As long she is not on it, she has no pain.   Pt states unable to wear tennis shoes at work due to dress code and her custom arch support only fits on her tennis shoes.  Gets out of work at 5 pm. Works 40 minutes away at [employer].      Pertinent History  L posterior leg pain. Not sure exactly when her pain began. Pain however has progressively worsened since Labor day (September 2018). Has not injured her L knee, back, and no fall.  Tore her L knee meniscus several years ago.  Denies loss of bowel or bladder control. No unexplained changes in weight. No LE paresthesias. Just bilateral hand tingling at times.  Her PA-C did not think her pain was due to a DVT and also did not think it was a sciatic nerve.      Patient Stated Goals  "I would like to know why my L knee is doing this."  Pt also states wanting to work on balance.     Currently in Pain?  No/denies    Pain Score  0-No pain    Pain Onset  More than a month ago         [facility] PT Assessment - 01/23/17 0001      Strength   Right Hip Extension  4/5    Right Hip ABduction  4/5    Left Hip Extension  4/5    Left Hip ABduction  4/5                          PT Education - 01/23/17 1843    Education provided  Yes    Education Details  ther-ex, HEP, plan of care    Person(s) Educated  Patient    Methods  Explanation;Demonstration;Tactile cues;Verbal cues;Handout    Comprehension  Returned demonstration;Verbalized understanding         Objectives   Ther-ex  Standing posture: L foot pronation compared to R  Pt was recommended to use her tennis shoes with her custom arch support right after work to help decrease  pronation and tibial IR at knee during gait.  Pt was also recommended to try an over the counter arch support to see if it fits her work shoes (making sure pt is able to return it if it does not work). Pt demonstrated and verbalized understanding.    Prone glute max/quad set 10x5 seconds each LE for 2 sets  Prone hip extension 10x each LE. Min A to decrease pelvic rotation   Seated clamshells, hips less than 90 degrees flexion red band 10x3                       Seated hip adduction small physioball squeeze 10x5 seconds for 3 sets   Reviewed HEP. Pt demonstrated and verbalized understanding.    Seated L arch raises 10x5 seconds for 2 sets  Manually resisted S/L hip abductoin, prone glute extension    Reviewed plan of care: 2x/week for 5 weeks ( secondary to pt taking a week off next week for Christmas to make PT 4 weeks)   Improved exercise technique, movement at target joints, use of target muscles after mod verbal, visual, tactile cues.   Pt demonstrates L foot pronation when standing. She was recommended to try over the counter arch supports to see if it fits in her work shoes as well as to use her sneakers with her custom orthotics right after work to promote better foot alignment and therefore better tibial positioning while walking longer distances to see if it helps decrease her L thigh, knee, and leg symptoms. Worked on intrinsic foot muscle strengthening on L to help decrease pronation, as well as hip strengthening to promote ability for femoral control when performing standing tasks. She also demonstrates overall improved bilateral hip strength and slight decreased L LE pain level overall since initial evaluation. Still has difficulty with the feeling of L LE heaviness after a lot of walking or standing activities, as well as hip weakness and decreased femoral control and would benefit from continued skilled physical therapy services to address the aforementioned deficits.         PT Long Term Goals - 01/23/17 1849      PT LONG TERM GOAL #1   Title  Pt will have a decrease in L posterior thigh/knee/leg pain to 4/10 or less at worst to promote  ability to ambulate, perform standing tasks.     Baseline  8.5/10 at worst for the past 2 months (12/26/2016); 7/10 at worst for the past 7 days (01/23/2017)     Time  6    Period  Weeks    Status  On-going    Target Date  02/28/17      PT LONG TERM GOAL #2   Title  Patient will improve bilateral glute med and max strength by at least 1/2 MMT grade to promote ability to ambulate, negotiate stairs with less L LE pain.     Time  6    Period  Weeks    Status  Partially Met    Target Date  02/28/17      PT LONG TERM GOAL #3   Title  Pt will report less feeling of heaviness of her L LE at the end of the day to promote ability to [facility] and get into and out of her car.     Baseline  Pt states L LE feels heavy at the end of the day (12/26/2016), (01/23/2017)    Time  6    Period  Weeks    Status  On-going    Target Date  02/28/17            Plan - 01/23/17 1907    Clinical Impression Statement  Pt demonstrates L foot pronation when standing. She was recommended to try over the counter arch supports to see if it fits in her work shoes as well as to use her sneakers with her custom orthotics right after work to promote better foot alignment and therefore better tibial positioning while walking longer distances to see if it helps decrease her L thigh, knee, and leg symptoms. Worked on intrinsic foot muscle strengthening on L to help decrease pronation, as well as hip strengthening to promote ability for femoral control when performing standing tasks. She also demonstrates overall improved bilateral hip strength and slight decreased L LE pain level overall since initial evaluation. Still has difficulty with the feeling of L LE heaviness after a lot of walking or standing activities, as well as hip weakness and  decreased femoral control and would benefit from continued skilled physical therapy services to address the aforementioned deficits.     History and Personal Factors relevant to plan of care:   weakness, pain, hx of L medial meniscal injury    Clinical Presentation  Stable    Clinical Presentation due to:  Pt tolerated session well without aggravation of symptoms    Clinical Decision Making  Low    Rehab Potential  Fair    Clinical Impairments Affecting Rehab Potential  Hx of L medial miniscal injury; pain    PT Frequency  2x / week    PT Duration  6 weeks    PT Treatment/Interventions  Therapeutic exercise;Therapeutic activities;Neuromuscular re-education;Manual techniques;Aquatic Therapy;Electrical Stimulation;Iontophoresis 37m/ml Dexamethasone;Ultrasound;Gait training;Stair training;Patient/family education;Dry needling    PT Next Visit Plan  hip strengthening, femoral control, modalities PRN    Consulted and Agree with Plan of Care  Patient       Patient will benefit from skilled therapeutic intervention in order to improve the following deficits and impairments:  Pain, Improper body mechanics, Difficulty walking, Decreased strength  Visit Diagnosis: Left knee pain, unspecified chronicity - Plan: PT plan of care cert/re-cert  Pain in left thigh - Plan: PT plan of care cert/re-cert  Pain in left leg - Plan: PT plan of care cert/re-cert  Difficulty in walking, not elsewhere classified - Plan: PT plan of care cert/re-cert  Muscle weakness (generalized) - Plan: PT plan of care cert/re-cert     Problem List Patient Active Problem List   Diagnosis Date Noted  . Allergic rhinitis 07/02/2014  . Diabetes ([place]) 07/02/2014  . Genital herpes 07/02/2014  . HLD (hyperlipidemia) 07/02/2014  . Adult hypothyroidism 07/02/2014  . Arthritis, degenerative 07/02/2014  . Adiposity 07/02/2014  . Obstructive apnea 07/02/2014  . Avitaminosis D 07/02/2014  . Tendonitis, deQuervains 11/07/2010   . Mucous cyst of toe 11/07/2010  . MEDIAL MENISCUS TEAR, LEFT 06/29/2008  . KNEE PAIN, LEFT 06/01/2008  . TRIGGER FINGER, THUMB 04/20/2008  . AODM 02/03/2008  . ESSENTIAL HYPERTENSION, BENIGN 02/03/2008  . ACHILLES BURSITIS OR TENDINITIS 02/03/2008  . 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 TALIPES CAVUS 02/03/2008    Joneen Boers PT, DPT   01/23/2017, 7:17 PM  [facility] [address] [place], [address] Phone: [phone]   Fax:  [phone]  Name: Cindy Hester MRN: [id number] Date of Birth: 1945/02/26

## 2017-01-23 NOTE — Patient Instructions (Addendum)
  Seated clamshells  Sitting on a chair and leaning back (back supported with pillow) so that your hips are not at a right (90 degree) angle, but more on an obtuse angle,   Stretch the red band out with your thighs     Perform 3 sets of 10 daily.      Adduction: Hip - Knees Together (Sitting)   Sit with 2 folded pillows between knees. Squeeze your rear end muscles.  Push knees together. Hold for _5__ seconds. Repeat _10__ times. Do __3_ times a day.  Copyright  VHI. All rights reserved.     Sitting on a chair    Foot flat on the floor,    Try to raise your arch up using your muscles, keeping your heel and forefoot on the floor   Hold for 5 seconds,    Repeat 10 times   Perform 2 sets daily.

## 2017-02-07 ENCOUNTER — Encounter: Payer: BLUE CROSS/BLUE SHIELD | Admitting: Physical Therapy

## 2017-02-11 ENCOUNTER — Ambulatory Visit: Payer: BLUE CROSS/BLUE SHIELD | Attending: Physician Assistant | Admitting: Physical Therapy

## 2017-02-11 ENCOUNTER — Encounter: Payer: Self-pay | Admitting: Physical Therapy

## 2017-02-11 DIAGNOSIS — M25562 Pain in left knee: Secondary | ICD-10-CM

## 2017-02-11 DIAGNOSIS — M79652 Pain in left thigh: Secondary | ICD-10-CM | POA: Insufficient documentation

## 2017-02-11 DIAGNOSIS — M6281 Muscle weakness (generalized): Secondary | ICD-10-CM

## 2017-02-11 DIAGNOSIS — M79605 Pain in left leg: Secondary | ICD-10-CM | POA: Diagnosis not present

## 2017-02-11 DIAGNOSIS — R262 Difficulty in walking, not elsewhere classified: Secondary | ICD-10-CM | POA: Insufficient documentation

## 2017-02-12 DIAGNOSIS — J301 Allergic rhinitis due to pollen: Secondary | ICD-10-CM | POA: Diagnosis not present

## 2017-02-12 DIAGNOSIS — J3089 Other allergic rhinitis: Secondary | ICD-10-CM | POA: Diagnosis not present

## 2017-02-12 NOTE — Therapy (Signed)
[facility] [address] [place], [address] Phone: [phone]   Fax:  [phone]  Physical Therapy Treatment  Patient Details  Name: Cindy Hester MRN: [id number] Date of Birth: 1945/05/06 Referring Provider: Carles Collet, PA-C   Encounter Date: 02/11/2017  PT End of Session - 02/11/17 1812    Visit Number  6    Number of Visits  21    Date for PT Re-Evaluation  02/28/17    Authorization Type  5    Authorization Time Period  of 30 insurance    PT Start Time  1802    PT Stop Time  0630    PT Time Calculation (min)  49 min    Activity Tolerance  Patient tolerated treatment well    Behavior During Therapy  [facility] for tasks assessed/performed       Past Medical History:  Diagnosis Date  . Arthritis    right shoulder blade, left thumb  . Diabetes mellitus without complication ([place])   . GERD (gastroesophageal reflux disease)   . Heart murmur    followed by PCP  . High cholesterol   . History of esophagitis   . History of gastritis   . HLD (hyperlipidemia)   . Hypertension   . Sleep apnea    CPAP    Past Surgical History:  Procedure Laterality Date  . BREAST CYST EXCISION Left    removed two times  . CESAREAN SECTION  1986  . CYST EXCISION     from left breast  . ESOPHAGOGASTRODUODENOSCOPY (EGD) WITH PROPOFOL N/A 12/12/2015   gastritis, LA Grade A reflux esophagitis ESOPHAGOGASTRODUODENOSCOPY (EGD) WITH PROPOFOL;  Surgeon: Manya Silvas, MD;  Location: [facility];  Service: Endoscopy;  Laterality: N/A;  Diabetic  . HAMMER TOE SURGERY Bilateral 05/30/2016   Procedure: HAMMER TOE CORRECTION RIGHT 2ND, 3RD, 4TH, 5TH TOES LEFT 5TH TOE;  Surgeon: Samara Deist, DPM;  Location: [place];  Service: Podiatry;  Laterality: Bilateral;  . HEEL SPUR SURGERY Right 2011  . NASAL SINUS SURGERY    . TONSILLECTOMY      There were no vitals filed for this visit.  Subjective Assessment -  02/11/17 1805    Subjective  Patient reports she is having intermittent left LE pain posteiror thigh into calf region. Pain is worse with prolonged walking such as with grocery shopping and decreases quickly with rest.     Pertinent History  L posterior leg pain. Not sure exactly when her pain began. Pain however has progressively worsened since Labor day (September 2018). Has not injured her L knee, back, and no fall.  Tore her L knee meniscus several years ago.  Denies loss of bowel or bladder control. No unexplained changes in weight. No LE paresthesias. Just bilateral hand tingling at times.  Her PA-C did not think her pain was due to a DVT and also did not think it was a sciatic nerve.      Limitations  Walking;House hold activities    Patient Stated Goals  "I would like to know why my L knee is doing this." Pt also states wanting to work on balance.     Currently in Pain?  No/denies         Objective: Special tests: seated slump test negative bilaterally without reproduction of symptoms Seated SLR negative bilaterally without reproduction of symptoms Strength: LE's hamstrings equal and strong without reproduction of symptoms   Treatment: Therapeutic exercise: patient performed  with instruction, verbal cues of therapist Sitting Re assessed home exercises for seated clam with red resistive band x 10 Instructed in side lying clamshells  Knee flexion with resistive band x 25 reps each LE   Patient response to treatment: patient demonstrated improved technique with exercises with minimal VC for correct alignment.      PT Education - 02/11/17 1810    Education provided  Yes    Education Details  exercise instruction for technique with home program; discussed symptoms at length with patient and discussed differential diagnoses including possibility of intermittent claudication; reviewed symtpoms with patient    Person(s) Educated  Patient    Methods   Explanation;Demonstration;Verbal cues    Comprehension  Verbalized understanding;Returned demonstration;Verbal cues required          PT Long Term Goals - 01/23/17 1849      PT LONG TERM GOAL #1   Title  Pt will have a decrease in L posterior thigh/knee/leg pain to 4/10 or less at worst to promote ability to ambulate, perform standing tasks.     Baseline  8.5/10 at worst for the past 2 months (12/26/2016); 7/10 at worst for the past 7 days (01/23/2017)     Time  6    Period  Weeks    Status  On-going    Target Date  02/28/17      PT LONG TERM GOAL #2   Title  Patient will improve bilateral glute med and max strength by at least 1/2 MMT grade to promote ability to ambulate, negotiate stairs with less L LE pain.     Time  6    Period  Weeks    Status  Partially Met    Target Date  02/28/17      PT LONG TERM GOAL #3   Title  Pt will report less feeling of heaviness of her L LE at the end of the day to promote ability to [facility] and get into and out of her car.     Baseline  Pt states L LE feels heavy at the end of the day (12/26/2016), (01/23/2017)    Time  6    Period  Weeks    Status  On-going    Target Date  02/28/17            Plan - 02/11/17 1911    Clinical Impression Statement  Patient continues with intermittent symptoms of posterior thigh and calf pain following prolonged walking, better with rest. Discussed at length her symptoms with negative special testing for lumbar radiculopathy including slump test, seated SLR and unable to reproduce symtoms with muscle testing of LE's. Reviewed history and sympotms with discussion of patient seeking further assessment to address possible intermittent claudication. She will contact primary care MD Dr. Rosanna Randy and will contact this office if further therapy is recommended.    Rehab Potential  Fair    Clinical Impairments Affecting Rehab Potential  Hx of L medial miniscal injury; pain    PT Frequency  2x / week    PT Duration   6 weeks    PT Treatment/Interventions  Therapeutic exercise;Therapeutic activities;Neuromuscular re-education;Manual techniques;Aquatic Therapy;Electrical Stimulation;Iontophoresis 56m/ml Dexamethasone;Ultrasound;Gait training;Stair training;Patient/family education;Dry needling    PT Next Visit Plan  hold therapy visits until further diagnostic testing is performed to determine source of patient pain/symptoms    PT Home Exercise Plan  continue with core and hip strengthening as instructed     Consulted and Agree with Plan of Care  Patient       Patient will benefit from skilled therapeutic intervention in order to improve the following deficits and impairments:  Pain, Improper body mechanics, Difficulty walking, Decreased strength  Visit Diagnosis: Left knee pain, unspecified chronicity  Pain in left thigh  Pain in left leg  Difficulty in walking, not elsewhere classified  Muscle weakness (generalized)     Problem List Patient Active Problem List   Diagnosis Date Noted  . Allergic rhinitis 07/02/2014  . Diabetes ([place]) 07/02/2014  . Genital herpes 07/02/2014  . HLD (hyperlipidemia) 07/02/2014  . Adult hypothyroidism 07/02/2014  . Arthritis, degenerative 07/02/2014  . Adiposity 07/02/2014  . Obstructive apnea 07/02/2014  . Avitaminosis D 07/02/2014  . Tendonitis, deQuervains 11/07/2010  . Mucous cyst of toe 11/07/2010  . MEDIAL MENISCUS TEAR, LEFT 06/29/2008  . KNEE PAIN, LEFT 06/01/2008  . TRIGGER FINGER, THUMB 04/20/2008  . AODM 02/03/2008  . ESSENTIAL HYPERTENSION, BENIGN 02/03/2008  . ACHILLES BURSITIS OR TENDINITIS 02/03/2008  . 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 TALIPES CAVUS 02/03/2008    Jomarie Longs PT 02/12/2017, 11:34 PM  [facility] [address], [place], [address] Phone: [phone]   Fax:  [phone]  Name: Cindy Hester MRN: [id number] Date of Birth: [birth year]

## 2017-02-13 ENCOUNTER — Ambulatory Visit: Payer: BLUE CROSS/BLUE SHIELD | Admitting: Physical Therapy

## 2017-02-18 ENCOUNTER — Encounter: Payer: BLUE CROSS/BLUE SHIELD | Admitting: Physical Therapy

## 2017-02-19 DIAGNOSIS — J3089 Other allergic rhinitis: Secondary | ICD-10-CM | POA: Diagnosis not present

## 2017-02-19 DIAGNOSIS — J301 Allergic rhinitis due to pollen: Secondary | ICD-10-CM | POA: Diagnosis not present

## 2017-02-20 ENCOUNTER — Encounter: Payer: BLUE CROSS/BLUE SHIELD | Admitting: Physical Therapy

## 2017-02-25 ENCOUNTER — Encounter: Payer: BLUE CROSS/BLUE SHIELD | Admitting: Physical Therapy

## 2017-02-26 DIAGNOSIS — J301 Allergic rhinitis due to pollen: Secondary | ICD-10-CM | POA: Diagnosis not present

## 2017-02-26 DIAGNOSIS — J3089 Other allergic rhinitis: Secondary | ICD-10-CM | POA: Diagnosis not present

## 2017-02-27 ENCOUNTER — Encounter: Payer: BLUE CROSS/BLUE SHIELD | Admitting: Physical Therapy

## 2017-03-05 DIAGNOSIS — J301 Allergic rhinitis due to pollen: Secondary | ICD-10-CM | POA: Diagnosis not present

## 2017-03-05 DIAGNOSIS — J3089 Other allergic rhinitis: Secondary | ICD-10-CM | POA: Diagnosis not present

## 2017-03-06 ENCOUNTER — Other Ambulatory Visit: Payer: Self-pay | Admitting: Family Medicine

## 2017-03-06 NOTE — Telephone Encounter (Signed)
Pharmacy requesting refills. Thanks!  

## 2017-03-12 DIAGNOSIS — J301 Allergic rhinitis due to pollen: Secondary | ICD-10-CM | POA: Diagnosis not present

## 2017-03-12 DIAGNOSIS — J3089 Other allergic rhinitis: Secondary | ICD-10-CM | POA: Diagnosis not present

## 2017-03-19 DIAGNOSIS — J301 Allergic rhinitis due to pollen: Secondary | ICD-10-CM | POA: Diagnosis not present

## 2017-03-19 DIAGNOSIS — J3089 Other allergic rhinitis: Secondary | ICD-10-CM | POA: Diagnosis not present

## 2017-03-29 DIAGNOSIS — J3089 Other allergic rhinitis: Secondary | ICD-10-CM | POA: Diagnosis not present

## 2017-03-29 DIAGNOSIS — J301 Allergic rhinitis due to pollen: Secondary | ICD-10-CM | POA: Diagnosis not present

## 2017-04-02 DIAGNOSIS — J3089 Other allergic rhinitis: Secondary | ICD-10-CM | POA: Diagnosis not present

## 2017-04-02 DIAGNOSIS — J301 Allergic rhinitis due to pollen: Secondary | ICD-10-CM | POA: Diagnosis not present

## 2017-04-05 ENCOUNTER — Other Ambulatory Visit: Payer: Self-pay | Admitting: Family Medicine

## 2017-04-09 DIAGNOSIS — E781 Pure hyperglyceridemia: Secondary | ICD-10-CM | POA: Diagnosis not present

## 2017-04-09 DIAGNOSIS — E1165 Type 2 diabetes mellitus with hyperglycemia: Secondary | ICD-10-CM | POA: Diagnosis not present

## 2017-04-09 DIAGNOSIS — J3089 Other allergic rhinitis: Secondary | ICD-10-CM | POA: Diagnosis not present

## 2017-04-09 DIAGNOSIS — J301 Allergic rhinitis due to pollen: Secondary | ICD-10-CM | POA: Diagnosis not present

## 2017-04-10 DIAGNOSIS — G4730 Sleep apnea, unspecified: Secondary | ICD-10-CM | POA: Diagnosis not present

## 2017-04-16 DIAGNOSIS — J301 Allergic rhinitis due to pollen: Secondary | ICD-10-CM | POA: Diagnosis not present

## 2017-04-16 DIAGNOSIS — E119 Type 2 diabetes mellitus without complications: Secondary | ICD-10-CM | POA: Diagnosis not present

## 2017-04-16 DIAGNOSIS — E781 Pure hyperglyceridemia: Secondary | ICD-10-CM | POA: Diagnosis not present

## 2017-04-16 DIAGNOSIS — J3089 Other allergic rhinitis: Secondary | ICD-10-CM | POA: Diagnosis not present

## 2017-04-16 DIAGNOSIS — E785 Hyperlipidemia, unspecified: Secondary | ICD-10-CM | POA: Diagnosis not present

## 2017-04-16 DIAGNOSIS — E538 Deficiency of other specified B group vitamins: Secondary | ICD-10-CM | POA: Diagnosis not present

## 2017-04-22 ENCOUNTER — Other Ambulatory Visit: Payer: Self-pay | Admitting: Family Medicine

## 2017-04-23 DIAGNOSIS — J3089 Other allergic rhinitis: Secondary | ICD-10-CM | POA: Diagnosis not present

## 2017-04-23 DIAGNOSIS — J301 Allergic rhinitis due to pollen: Secondary | ICD-10-CM | POA: Diagnosis not present

## 2017-04-30 ENCOUNTER — Encounter: Payer: Self-pay | Admitting: Family Medicine

## 2017-04-30 ENCOUNTER — Ambulatory Visit (INDEPENDENT_AMBULATORY_CARE_PROVIDER_SITE_OTHER): Payer: BLUE CROSS/BLUE SHIELD | Admitting: Family Medicine

## 2017-04-30 VITALS — BP 148/52 | HR 68 | Temp 98.0°F | Resp 16 | Ht 66.0 in | Wt 189.0 lb

## 2017-04-30 DIAGNOSIS — E782 Mixed hyperlipidemia: Secondary | ICD-10-CM

## 2017-04-30 DIAGNOSIS — Z0001 Encounter for general adult medical examination with abnormal findings: Secondary | ICD-10-CM | POA: Diagnosis not present

## 2017-04-30 DIAGNOSIS — J301 Allergic rhinitis due to pollen: Secondary | ICD-10-CM | POA: Diagnosis not present

## 2017-04-30 DIAGNOSIS — I739 Peripheral vascular disease, unspecified: Secondary | ICD-10-CM

## 2017-04-30 DIAGNOSIS — J3089 Other allergic rhinitis: Secondary | ICD-10-CM | POA: Diagnosis not present

## 2017-04-30 DIAGNOSIS — E559 Vitamin D deficiency, unspecified: Secondary | ICD-10-CM

## 2017-04-30 DIAGNOSIS — Z780 Asymptomatic menopausal state: Secondary | ICD-10-CM | POA: Diagnosis not present

## 2017-04-30 DIAGNOSIS — Z0000 Encounter for general adult medical examination without abnormal findings: Secondary | ICD-10-CM

## 2017-04-30 DIAGNOSIS — Z1239 Encounter for other screening for malignant neoplasm of breast: Secondary | ICD-10-CM

## 2017-04-30 DIAGNOSIS — G4733 Obstructive sleep apnea (adult) (pediatric): Secondary | ICD-10-CM

## 2017-04-30 DIAGNOSIS — I10 Essential (primary) hypertension: Secondary | ICD-10-CM | POA: Diagnosis not present

## 2017-04-30 NOTE — Progress Notes (Signed)
Patient: Cindy Hester, Female    DOB: 05-04-1945, 72 y.o.   MRN: [id number] Visit Date: 04/30/2017  Today's Provider: Wilhemena Durie, MD   Chief Complaint  Patient presents with  . Annual Exam   Subjective:    Annual physical exam Cindy Hester is a 72 y.o. female who presents today for health maintenance and complete physical. She feels fairly well. She reports she is not exercising. She reports she is sleeping well. Pt has multiple questions in addition to CPE. She wants to review her recent 2 GI w/u for belching She has been on Baclofen. GERD no better. She has chronic leg pain and wonders if she could have PAD. She has chronic decreased sensation of feet. She has OSA and wants a new CPAP machine as she says hers is 70 years old. Dr Eddie Dibbles is leaving [facility] and she requests another endocrinologist  She asks about ASA use. -----------------------------------------------------------------  Colonoscopy- 01/07/17 Dr. Candace Cruise, Repeat 10 years Mammogram- 04/24/16 negative Dr. Georga Bora BMD- 12/30/13 Normal Dr. Georga Bora Pap- 12/18/08 normal She has an appt with GYN on Nov 12 th   Review of Systems  Constitutional: Positive for activity change.  HENT: Positive for rhinorrhea, sinus pressure and sneezing.   Eyes: Negative.   Respiratory: Positive for apnea.   Cardiovascular: Positive for leg swelling.  Gastrointestinal: Positive for constipation and diarrhea.  Endocrine: Negative.   Genitourinary: Negative.   Musculoskeletal: Positive for arthralgias.  Skin: Negative.   Allergic/Immunologic: Positive for environmental allergies.  Neurological: Negative.   Hematological: Negative.   Psychiatric/Behavioral: Negative.     Social History      She  reports that she has never smoked. She has never used smokeless tobacco. She reports that she drinks alcohol. She reports that she does not use drugs.       Social History   Socioeconomic History  . Marital status: Single     Spouse name: Not on file  . Number of children: 1  . Years of education: master's  . Highest education level: Not on file  Occupational History  . Occupation: Training and development officer: [employer]  Social Needs  . Financial resource strain: Not on file  . Food insecurity:    Worry: Not on file    Inability: Not on file  . Transportation needs:    Medical: Not on file    Non-medical: Not on file  Tobacco Use  . Smoking status: Never Smoker  . Smokeless tobacco: Never Used  Substance and Sexual Activity  . Alcohol use: Yes    Comment: maybe 1 drink once a month  . Drug use: No  . Sexual activity: Not on file  Lifestyle  . Physical activity:    Days per week: Not on file    Minutes per session: Not on file  . Stress: Not on file  Relationships  . Social connections:    Talks on phone: Not on file    Gets together: Not on file    Attends religious service: Not on file    Active member of club or organization: Not on file    Attends meetings of clubs or organizations: Not on file    Relationship status: Not on file  Other Topics Concern  . Not on file  Social History Narrative  . Not on file    Past Medical History:  Diagnosis Date  . Arthritis    right shoulder  blade, left thumb  . Diabetes mellitus without complication ([place])   . GERD (gastroesophageal reflux disease)   . Heart murmur    followed by PCP  . High cholesterol   . History of esophagitis   . History of gastritis   . HLD (hyperlipidemia)   . Hypertension   . Sleep apnea    CPAP     Patient Active Problem List   Diagnosis Date Noted  . Allergic rhinitis 07/02/2014  . Diabetes ([place]) 07/02/2014  . Genital herpes 07/02/2014  . HLD (hyperlipidemia) 07/02/2014  . Adult hypothyroidism 07/02/2014  . Arthritis, degenerative 07/02/2014  . Adiposity 07/02/2014  . Obstructive apnea 07/02/2014  . Avitaminosis D 07/02/2014  . Tendonitis, deQuervains 11/07/2010  . Mucous cyst of  toe 11/07/2010  . MEDIAL MENISCUS TEAR, LEFT 06/29/2008  . KNEE PAIN, LEFT 06/01/2008  . TRIGGER FINGER, THUMB 04/20/2008  . AODM 02/03/2008  . ESSENTIAL HYPERTENSION, BENIGN 02/03/2008  . ACHILLES BURSITIS OR TENDINITIS 02/03/2008  . 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 TALIPES CAVUS 02/03/2008    Past Surgical History:  Procedure Laterality Date  . BREAST CYST EXCISION Left    removed two times  . CESAREAN SECTION  1986  . CYST EXCISION     from left breast  . ESOPHAGOGASTRODUODENOSCOPY (EGD) WITH PROPOFOL N/A 12/12/2015   gastritis, LA Grade A reflux esophagitis ESOPHAGOGASTRODUODENOSCOPY (EGD) WITH PROPOFOL;  Surgeon: Manya Silvas, MD;  Location: [facility];  Service: Endoscopy;  Laterality: N/A;  Diabetic  . HAMMER TOE SURGERY Bilateral 05/30/2016   Procedure: HAMMER TOE CORRECTION RIGHT 2ND, 3RD, 4TH, 5TH TOES LEFT 5TH TOE;  Surgeon: Samara Deist, DPM;  Location: [facility];  Service: Podiatry;  Laterality: Bilateral;  . HEEL SPUR SURGERY Right 2011  . NASAL SINUS SURGERY    . TONSILLECTOMY      Family History        Family Status  Relation Name Status  . Mother  Deceased at age 24  . Father  Deceased at age 23  . Brother  Alive  . Ethlyn Daniels  Deceased  . Ethlyn Daniels  Deceased  . MGM  Deceased  . MGF  Deceased  . PGM  Deceased  . PGF  Deceased  . Brother  Alive  . Brother  Alive  . Neg Hx  (Not Specified)        Her family history includes Breast cancer in her paternal aunt and paternal aunt; CAD in her father; Congestive Heart Failure in her father; Diabetes in her brother and mother; Glaucoma in her father; Heart disease in her brother; Hyperlipidemia in her brother; Hypertension in her father and mother; Stroke in her mother. There is no history of Stomach cancer or Colon cancer.      Allergies  Allergen Reactions  . Tape     Most tapes cause redness.  Paper tape and tegaderm are OK.     Current Outpatient Medications:  .  amLODipine (NORVASC)  10 MG tablet, take 1 tablet by mouth once daily, Disp: 90 tablet, Rfl: 3 .  aspirin 81 MG tablet, Take 1 tablet (81 mg total) daily by mouth., Disp: 30 tablet, Rfl:  .  BIOTIN 5000 PO, Take by mouth daily., Disp: , Rfl:  .  calcium carbonate (OS-CAL) 600 MG TABS tablet, Take by mouth., Disp: , Rfl:  .  cholecalciferol (VITAMIN D) 1000 UNITS tablet, Take 5,000 Units by mouth once a week. , Disp: , Rfl:  .  empagliflozin (JARDIANCE) 25  MG TABS tablet, Take 25 mg by mouth daily., Disp: , Rfl:  .  furosemide (LASIX) 40 MG tablet, Take 40 mg by mouth daily., Disp: , Rfl:  .  glucosamine-chondroitin (MAX GLUCOSAMINE CHONDROITIN) 500-400 MG tablet, Take 1 tablet by mouth 3 (three) times daily., Disp: 30 tablet, Rfl: 0 .  hydrALAZINE (APRESOLINE) 25 MG tablet, take 1 tablet by mouth twice a day, Disp: 60 tablet, Rfl: 12 .  Liraglutide (VICTOZA) 18 MG/3ML SOPN, INJECT 1.8MG  UNDER THE SKIN ONCE DAILY AS DIRECTED, Disp: , Rfl:  .  losartan-hydrochlorothiazide (HYZAAR) 100-25 MG tablet, take 1 tablet by mouth once daily, Disp: 30 tablet, Rfl: 12 .  metFORMIN (GLUCOPHAGE) 1000 MG tablet, Take 1,000 mg by mouth 2 (two) times daily with a meal. , Disp: , Rfl:  .  metoprolol (TOPROL-XL) 200 MG 24 hr tablet, take 1 tablet by mouth once daily, Disp: 30 tablet, Rfl: 12 .  mometasone (NASONEX) 50 MCG/ACT nasal spray, instill 1 to 2 sprays into each nostril once daily, Disp: , Rfl: 0 .  montelukast (SINGULAIR) 10 MG tablet, TAKE 1 TABLET BY MOUTH DAILY. GENERIC EQUIVALENT FOR SINGULAIR., Disp: 90 tablet, Rfl: 3 .  Multiple Vitamin (MULTIVITAMIN) tablet, Take 1 tablet by mouth daily.  , Disp: , Rfl:  .  Multiple Vitamins-Minerals (HAIR SKIN AND NAILS FORMULA PO), Take by mouth., Disp: , Rfl:  .  polyethylene glycol powder (GLYCOLAX/MIRALAX) powder, Take 255 g 2 (two) times daily by mouth. (Patient taking differently: Take 1 Container by mouth 2 (two) times daily. PRN), Disp: 255 g, Rfl: 3 .  rosuvastatin (CRESTOR) 20 MG  tablet, TAKE 1 TABLET BY MOUTH AT BEDTIME, Disp: 90 tablet, Rfl: 3 .  valACYclovir (VALTREX) 500 MG tablet, take 1 tablet by mouth twice a day if needed, Disp: 60 tablet, Rfl: 3 .  vitamin B-12 (CYANOCOBALAMIN) 1000 MCG tablet, Take 1,000 mcg by mouth daily., Disp: , Rfl:  .  AMBULATORY NON FORMULARY MEDICATION, Medication Name: FDgard: Please up to 3 times a day as needed, Disp: 12 capsule, Rfl: 0 .  Lysine 1000 MG TABS, Take by mouth daily., Disp: , Rfl:  .  pantoprazole (PROTONIX) 40 MG tablet, Take 1 tablet (40 mg total) by mouth every morning. (Patient not taking: Reported on 04/30/2017), Disp: 30 tablet, Rfl: 12 .  phentermine 30 MG capsule, Take 30 mg by mouth every morning., Disp: , Rfl:  .  sucralfate (CARAFATE) 1 g tablet, Take 1 tablet (1 g total) by mouth 4 (four) times daily -  with meals and at bedtime. (Patient not taking: Reported on 04/30/2017), Disp: 120 tablet, Rfl: 5   Patient Care Team: Jerrol Banana., MD as PCP - General (Family Medicine)      Objective:   Vitals: BP (!) 148/52 (BP Location: Left Arm, Patient Position: Sitting, Cuff Size: Large)   Pulse 68   Temp 98 F (36.7 C) (Oral)   Resp 16   Ht 5\' 6"  (1.676 m)   Wt 189 lb (85.7 kg)   BMI 30.51 kg/m    Vitals:   04/30/17 0915  BP: (!) 148/52  Pulse: 68  Resp: 16  Temp: 98 F (36.7 C)  TempSrc: Oral  Weight: 189 lb (85.7 kg)  Height: 5\' 6"  (1.676 m)     Physical Exam  Constitutional: She is oriented to person, place, and time. She appears well-developed and well-nourished.  HENT:  Head: Normocephalic and atraumatic.  Right Ear: External ear normal.  Left Ear: External ear  normal.  Nose: Nose normal.  Mouth/Throat: Oropharynx is clear and moist.  Eyes: Conjunctivae are normal. No scleral icterus.  Neck: No thyromegaly present.  Cardiovascular: Normal rate, regular rhythm, normal heart sounds and intact distal pulses.  Possible decreased pulse on left foot as compared to right.    Pulmonary/Chest: Effort normal and breath sounds normal.  Abdominal: Soft.  Lymphadenopathy:    She has no cervical adenopathy.  Neurological: She is alert and oriented to person, place, and time. No cranial nerve deficit. She exhibits normal muscle tone. Coordination normal.  Decreased sensation bilateral distal feet.  Skin: Skin is warm and dry.  Psychiatric: She has a normal mood and affect. Her behavior is normal. Judgment and thought content normal.     Depression Screen PHQ 2/9 Scores 04/30/2017 04/30/2017 04/25/2016 04/19/2015  PHQ - 2 Score 0 0 0 0  PHQ- 9 Score 0 - 2 -      Assessment & Plan:     Routine Health Maintenance and Physical Exam  Exercise Activities and Dietary recommendations Goals    None      Immunization History  Administered Date(s) Administered  . Influenza, High Dose Seasonal PF 10/31/2016  . Influenza-Unspecified 10/07/2014  . Pneumococcal Conjugate-13 04/13/2014  . Pneumococcal Polysaccharide-23 11/18/2012  . Tdap 07/19/2009  . Zoster 05/26/2009    Health Maintenance  Topic Date Due  . Hepatitis C Screening  30-Apr-1945  . FOOT EXAM  08/29/1957  . HEMOGLOBIN A1C  03/14/2017  . OPHTHALMOLOGY EXAM  06/18/2017  . MAMMOGRAM  04/07/2018  . TETANUS/TDAP  07/20/2019  . COLONOSCOPY  01/14/2021  . INFLUENZA VACCINE  Completed  . DEXA SCAN  Completed  . PNA vac Low Risk Adult  Completed    BMD Discussed health benefits of physical activity, and encouraged her to engage in regular exercise appropriate for her age and condition.  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 OSA CPAP written. GERD Stop carafate and Rx for omeprazole--she has f/u with GI. HLD Off of [place]. HTN  I have done the exam and reviewed the chart and it is accurate to the best of my knowledge. Development worker, community has been used and  any errors in dictation or transcription are  unintentional. Miguel Aschoff M.D. [facility]        --------------------------------------------------------------------    Wilhemena Durie, MD  [facility]

## 2017-04-30 NOTE — Patient Instructions (Signed)
RESTART Omeprazole, STOP Carafate. You will get phone calls regaurding the appointments for the referrals and imaging that were placed.  today.

## 2017-05-07 DIAGNOSIS — J301 Allergic rhinitis due to pollen: Secondary | ICD-10-CM | POA: Diagnosis not present

## 2017-05-07 DIAGNOSIS — J3089 Other allergic rhinitis: Secondary | ICD-10-CM | POA: Diagnosis not present

## 2017-05-14 ENCOUNTER — Ambulatory Visit (INDEPENDENT_AMBULATORY_CARE_PROVIDER_SITE_OTHER): Payer: BLUE CROSS/BLUE SHIELD | Admitting: Vascular Surgery

## 2017-05-14 ENCOUNTER — Encounter (INDEPENDENT_AMBULATORY_CARE_PROVIDER_SITE_OTHER): Payer: Self-pay | Admitting: Vascular Surgery

## 2017-05-14 VITALS — BP 143/67 | HR 73 | Resp 16 | Ht 67.0 in | Wt 192.4 lb

## 2017-05-14 DIAGNOSIS — I10 Essential (primary) hypertension: Secondary | ICD-10-CM | POA: Diagnosis not present

## 2017-05-14 DIAGNOSIS — E785 Hyperlipidemia, unspecified: Secondary | ICD-10-CM | POA: Diagnosis not present

## 2017-05-14 DIAGNOSIS — R600 Localized edema: Secondary | ICD-10-CM

## 2017-05-14 DIAGNOSIS — E118 Type 2 diabetes mellitus with unspecified complications: Secondary | ICD-10-CM | POA: Diagnosis not present

## 2017-05-14 DIAGNOSIS — J3089 Other allergic rhinitis: Secondary | ICD-10-CM | POA: Diagnosis not present

## 2017-05-14 DIAGNOSIS — J301 Allergic rhinitis due to pollen: Secondary | ICD-10-CM | POA: Diagnosis not present

## 2017-05-14 DIAGNOSIS — I739 Peripheral vascular disease, unspecified: Secondary | ICD-10-CM

## 2017-05-14 DIAGNOSIS — Z794 Long term (current) use of insulin: Secondary | ICD-10-CM

## 2017-05-14 NOTE — Progress Notes (Signed)
Subjective:    Patient ID: Cindy Hester, female    DOB: 25-Nov-1945, 72 y.o.   MRN: [id number] Chief Complaint  Patient presents with  . New Patient (Initial Visit)    ref Rosanna Randy claudication   Presents as a new patient referred by Dr. Rosanna Randy for evaluation of peripheral artery disease.  The patient and endorses a history of progressively worsening left lower extremity "pain".  Patient describes her discomfort as pain radiating from approximately mid thigh to mid calf which occurs with activity.  The discomfort stops when she is not active.  The patient denies rest pain or ulceration to the bilateral lower extremity.  The patient notes a past medical history of a left knee meniscal tear which was not repaired surgically.  The patient does experience bilateral lower extremity edema.  This edema tends to worsen towards the end of the day.  At this time, the patient does not engage in conservative therapy including wearing medical grade 1 compression socks.  The patient has been treated by a physical therapy for her left lower extremity discomfort without any improvement.  The patient denies any fever, nausea vomiting.  Review of Systems  Constitutional: Negative.   HENT: Negative.   Eyes: Negative.   Respiratory: Negative.   Cardiovascular: Positive for leg swelling.       Left lower extremity claudication  Gastrointestinal: Negative.   Endocrine: Negative.   Genitourinary: Negative.   Musculoskeletal: Negative.   Skin: Negative.   Allergic/Immunologic: Negative.   Neurological: Negative.   Hematological: Negative.   Psychiatric/Behavioral: Negative.       Objective:   Physical Exam  Constitutional: She is oriented to person, place, and time. She appears well-developed and well-nourished. No distress.  HENT:  Head: Normocephalic and atraumatic.  Eyes: Pupils are equal, round, and reactive to light. Conjunctivae are normal.  Neck: Normal range of motion.  Cardiovascular: Normal  rate, regular rhythm, normal heart sounds and intact distal pulses.  Pulses:      Radial pulses are 2+ on the right side, and 2+ on the left side.  Hard to palpate pedal pulses due to edema and body habitus however her bilateral feet are warm.  Pulmonary/Chest: Effort normal and breath sounds normal.  Musculoskeletal: Normal range of motion. She exhibits edema (Mild to moderate 1+ pitting edema noted bilaterally).  Neurological: She is alert and oriented to person, place, and time.  Skin: Skin is warm and dry. She is not diaphoretic.  Less than 1 cm scattered varicosities to the bilateral lower extremity.  There is no stasis dermatitis, skin changes, cellulitis or ulcerations noted to the bilateral lower extremity.  Psychiatric: She has a normal mood and affect. Her behavior is normal. Judgment and thought content normal.  Vitals reviewed.  BP (!) 143/67 (BP Location: Right Arm)   Pulse 73   Resp 16   Ht 5\' 7"  (1.702 m)   Wt 192 lb 6.4 oz (87.3 kg)   BMI 30.13 kg/m   Past Medical History:  Diagnosis Date  . Arthritis    right shoulder blade, left thumb  . Diabetes mellitus without complication ([place])   . GERD (gastroesophageal reflux disease)   . Heart murmur    followed by PCP  . High cholesterol   . History of esophagitis   . History of gastritis   . HLD (hyperlipidemia)   . Hypertension   . Sleep apnea    CPAP   Social History   Socioeconomic History  . Marital  status: Single    Spouse name: Not on file  . Number of children: 1  . Years of education: master's  . Highest education level: Not on file  Occupational History  . Occupation: Training and development officer: [employer]  Social Needs  . Financial resource strain: Not on file  . Food insecurity:    Worry: Not on file    Inability: Not on file  . Transportation needs:    Medical: Not on file    Non-medical: Not on file  Tobacco Use  . Smoking status: Never Smoker  . Smokeless tobacco:  Never Used  Substance and Sexual Activity  . Alcohol use: Yes    Comment: maybe 1 drink once a month  . Drug use: No  . Sexual activity: Not on file  Lifestyle  . Physical activity:    Days per week: Not on file    Minutes per session: Not on file  . Stress: Not on file  Relationships  . Social connections:    Talks on phone: Not on file    Gets together: Not on file    Attends religious service: Not on file    Active member of club or organization: Not on file    Attends meetings of clubs or organizations: Not on file    Relationship status: Not on file  . Intimate partner violence:    Fear of current or ex partner: Not on file    Emotionally abused: Not on file    Physically abused: Not on file    Forced sexual activity: Not on file  Other Topics Concern  . Not on file  Social History Narrative  . Not on file   Past Surgical History:  Procedure Laterality Date  . BREAST CYST EXCISION Left    removed two times  . CESAREAN SECTION  1986  . CYST EXCISION     from left breast  . ESOPHAGOGASTRODUODENOSCOPY (EGD) WITH PROPOFOL N/A 12/12/2015   gastritis, LA Grade A reflux esophagitis ESOPHAGOGASTRODUODENOSCOPY (EGD) WITH PROPOFOL;  Surgeon: Manya Silvas, MD;  Location: [facility];  Service: Endoscopy;  Laterality: N/A;  Diabetic  . HAMMER TOE SURGERY Bilateral 05/30/2016   Procedure: HAMMER TOE CORRECTION RIGHT 2ND, 3RD, 4TH, 5TH TOES LEFT 5TH TOE;  Surgeon: Samara Deist, DPM;  Location: [facility];  Service: Podiatry;  Laterality: Bilateral;  . HEEL SPUR SURGERY Right 2011  . NASAL SINUS SURGERY    . TONSILLECTOMY     Family History  Problem Relation Age of Onset  . Stroke Mother   . Diabetes Mother   . Hypertension Mother   . Congestive Heart Failure Father   . Hypertension Father   . CAD Father   . Glaucoma Father   . Hyperlipidemia Brother   . Breast cancer Paternal Aunt   . Breast cancer Paternal Aunt   . Diabetes Brother   . Heart disease  Brother   . Stomach cancer Neg Hx   . Colon cancer Neg Hx    Allergies  Allergen Reactions  . Tape     Most tapes cause redness.  Paper tape and tegaderm are OK.      Assessment & Plan:  Presents as a new patient referred by Dr. Rosanna Randy for evaluation of peripheral artery disease.  The patient and endorses a history of progressively worsening left lower extremity "pain".  Patient describes her discomfort as pain radiating from approximately mid thigh to mid calf which  occurs with activity.  The discomfort stops when she is not active.  The patient denies rest pain or ulceration to the bilateral lower extremity.  The patient notes a past medical history of a left knee meniscal tear which was not repaired surgically.  The patient does experience bilateral lower extremity edema.  This edema tends to worsen towards the end of the day.  At this time, the patient does not engage in conservative therapy including wearing medical grade 1 compression socks.  The patient has been treated by a physical therapy for her left lower extremity discomfort without any improvement.  The patient denies any fever, nausea vomiting.  1. Claudication of left lower extremity ([place]) - New Patient with multiple risk factors for peripheral artery disease Unable to palpate pedal pulses on exam Aggressively worsening left lower extremity claudication-like symptoms with activity I will bring the patient back to undergo an ABI to assess for any contributing peripheral artery disease I have discussed with the patient at length the risk factors for and pathogenesis of atherosclerotic disease and encouraged a healthy diet, regular exercise regimen and blood pressure / glucose control.  The patient was encouraged to call the office in the interim if he experiences any claudication like symptoms, rest pain or ulcers to his feet / toes.  - VAS [place] ABI WITH/WO TBI; Future  2. Bilateral lower extremity edema - New The patient was  encouraged to wear graduated compression stockings (20-30 mmHg) on a daily basis. The patient was instructed to begin wearing the stockings first thing in the morning and removing them in the evening. The patient was instructed specifically not to sleep in the stockings. Prescription given. In addition, behavioral modification including elevation during the day will be initiated. Anti-inflammatories for pain. The patient will follow up in one months to asses conservative management.  Information on compression stockings was given to the patient. I will bring the patient back and have her undergo bilateral venous reflux study to assess for any contributing venous insufficiency The patient was instructed to call the office in the interim if any worsening edema or ulcerations to the legs, feet or toes occurs. The patient expresses their understanding.  - VAS [place] LOWER EXTREMITY VENOUS REFLUX; Future  3. Essential hypertension, benign - Stable Encouraged good control as its slows the progression of atherosclerotic disease  4. Type 2 diabetes mellitus with complication, with long-term current use of insulin (HCC) - Stable Encouraged good control as its slows the progression of atherosclerotic disease  5. Hyperlipidemia, unspecified hyperlipidemia type - Stable Encouraged good control as its slows the progression of atherosclerotic disease  Current Outpatient Medications on File Prior to Visit  Medication Sig Dispense Refill  . AMBULATORY NON FORMULARY MEDICATION Medication Name: FDgard: Please up to 3 times a day as needed 12 capsule 0  . amLODipine (NORVASC) 10 MG tablet take 1 tablet by mouth once daily 90 tablet 3  . aspirin 81 MG tablet Take 1 tablet (81 mg total) daily by mouth. 30 tablet   . BIOTIN 5000 PO Take by mouth daily.    . calcium carbonate (OS-CAL) 600 MG TABS tablet Take by mouth.    . cholecalciferol (VITAMIN D) 1000 UNITS tablet Take 5,000 Units by mouth once a week.     .  empagliflozin (JARDIANCE) 25 MG TABS tablet Take 25 mg by mouth daily.    . fluticasone (FLONASE) 50 MCG/ACT nasal spray Place into both nostrils as needed for allergies or rhinitis.    Marland Kitchen  furosemide (LASIX) 40 MG tablet Take 20 mg by mouth as needed.     Marland Kitchen glucosamine-chondroitin (MAX GLUCOSAMINE CHONDROITIN) 500-400 MG tablet Take 1 tablet by mouth 3 (three) times daily. 30 tablet 0  . hydrALAZINE (APRESOLINE) 25 MG tablet take 1 tablet by mouth twice a day 60 tablet 12  . Liraglutide (VICTOZA) 18 MG/3ML SOPN INJECT 1.8MG  UNDER THE SKIN ONCE DAILY AS DIRECTED    . losartan-hydrochlorothiazide (HYZAAR) 100-25 MG tablet take 1 tablet by mouth once daily 30 tablet 12  . metFORMIN (GLUCOPHAGE) 1000 MG tablet Take 1,000 mg by mouth 2 (two) times daily with a meal.     . metoprolol (TOPROL-XL) 200 MG 24 hr tablet take 1 tablet by mouth once daily 30 tablet 12  . mometasone (NASONEX) 50 MCG/ACT nasal spray instill 1 to 2 sprays into each nostril once daily  0  . montelukast (SINGULAIR) 10 MG tablet TAKE 1 TABLET BY MOUTH DAILY. GENERIC EQUIVALENT FOR SINGULAIR. 90 tablet 3  . Multiple Vitamin (MULTIVITAMIN) tablet Take 1 tablet by mouth daily.      . Multiple Vitamins-Minerals (HAIR SKIN AND NAILS FORMULA PO) Take by mouth.    Marland Kitchen omeprazole (PRILOSEC) 20 MG capsule Take 20 mg by mouth daily.    . rosuvastatin (CRESTOR) 20 MG tablet TAKE 1 TABLET BY MOUTH AT BEDTIME 90 tablet 3  . valACYclovir (VALTREX) 500 MG tablet take 1 tablet by mouth twice a day if needed 60 tablet 3  . vitamin B-12 (CYANOCOBALAMIN) 1000 MCG tablet Take 1,000 mcg by mouth daily.    Marland Kitchen Lysine 1000 MG TABS Take by mouth daily.    . pantoprazole (PROTONIX) 40 MG tablet Take 1 tablet (40 mg total) by mouth every morning. (Patient not taking: Reported on 04/30/2017) 30 tablet 12  . phentermine 30 MG capsule Take 30 mg by mouth every morning.    . polyethylene glycol powder (GLYCOLAX/MIRALAX) powder Take 255 g 2 (two) times daily by mouth.  (Patient not taking: Reported on 05/14/2017) 255 g 3  . sucralfate (CARAFATE) 1 g tablet Take 1 tablet (1 g total) by mouth 4 (four) times daily -  with meals and at bedtime. (Patient not taking: Reported on 04/30/2017) 120 tablet 5   No current facility-administered medications on file prior to visit.    There are no Patient Instructions on file for this visit. No follow-ups on file.  KIMBERLY A STEGMAYER, PA-C

## 2017-05-17 DIAGNOSIS — J3089 Other allergic rhinitis: Secondary | ICD-10-CM | POA: Diagnosis not present

## 2017-05-17 DIAGNOSIS — J301 Allergic rhinitis due to pollen: Secondary | ICD-10-CM | POA: Diagnosis not present

## 2017-05-21 DIAGNOSIS — J3089 Other allergic rhinitis: Secondary | ICD-10-CM | POA: Diagnosis not present

## 2017-05-21 DIAGNOSIS — J301 Allergic rhinitis due to pollen: Secondary | ICD-10-CM | POA: Diagnosis not present

## 2017-05-28 DIAGNOSIS — J301 Allergic rhinitis due to pollen: Secondary | ICD-10-CM | POA: Diagnosis not present

## 2017-05-28 DIAGNOSIS — J3089 Other allergic rhinitis: Secondary | ICD-10-CM | POA: Diagnosis not present

## 2017-06-04 ENCOUNTER — Ambulatory Visit
Admission: RE | Admit: 2017-06-04 | Discharge: 2017-06-04 | Disposition: A | Discharge status: Home / Self Care | Payer: BLUE CROSS/BLUE SHIELD | Source: Ambulatory Visit | Attending: Family Medicine | Admitting: Family Medicine

## 2017-06-04 DIAGNOSIS — J3089 Other allergic rhinitis: Secondary | ICD-10-CM | POA: Diagnosis not present

## 2017-06-04 DIAGNOSIS — Z1231 Encounter for screening mammogram for malignant neoplasm of breast: Secondary | ICD-10-CM | POA: Insufficient documentation

## 2017-06-04 DIAGNOSIS — Z1239 Encounter for other screening for malignant neoplasm of breast: Secondary | ICD-10-CM

## 2017-06-04 DIAGNOSIS — Z780 Asymptomatic menopausal state: Secondary | ICD-10-CM | POA: Diagnosis not present

## 2017-06-04 DIAGNOSIS — E559 Vitamin D deficiency, unspecified: Secondary | ICD-10-CM | POA: Diagnosis not present

## 2017-06-04 DIAGNOSIS — Z13820 Encounter for screening for osteoporosis: Secondary | ICD-10-CM | POA: Diagnosis not present

## 2017-06-04 DIAGNOSIS — J301 Allergic rhinitis due to pollen: Secondary | ICD-10-CM | POA: Diagnosis not present

## 2017-06-13 DIAGNOSIS — J3089 Other allergic rhinitis: Secondary | ICD-10-CM | POA: Diagnosis not present

## 2017-06-13 DIAGNOSIS — J301 Allergic rhinitis due to pollen: Secondary | ICD-10-CM | POA: Diagnosis not present

## 2017-06-18 DIAGNOSIS — J3089 Other allergic rhinitis: Secondary | ICD-10-CM | POA: Diagnosis not present

## 2017-06-18 DIAGNOSIS — J301 Allergic rhinitis due to pollen: Secondary | ICD-10-CM | POA: Diagnosis not present

## 2017-06-19 ENCOUNTER — Ambulatory Visit (INDEPENDENT_AMBULATORY_CARE_PROVIDER_SITE_OTHER): Payer: BLUE CROSS/BLUE SHIELD | Admitting: Vascular Surgery

## 2017-06-19 ENCOUNTER — Encounter (INDEPENDENT_AMBULATORY_CARE_PROVIDER_SITE_OTHER): Payer: BLUE CROSS/BLUE SHIELD

## 2017-06-25 DIAGNOSIS — J301 Allergic rhinitis due to pollen: Secondary | ICD-10-CM | POA: Diagnosis not present

## 2017-06-25 DIAGNOSIS — J3089 Other allergic rhinitis: Secondary | ICD-10-CM | POA: Diagnosis not present

## 2017-07-02 DIAGNOSIS — J3089 Other allergic rhinitis: Secondary | ICD-10-CM | POA: Diagnosis not present

## 2017-07-02 DIAGNOSIS — J301 Allergic rhinitis due to pollen: Secondary | ICD-10-CM | POA: Diagnosis not present

## 2017-07-03 ENCOUNTER — Ambulatory Visit (INDEPENDENT_AMBULATORY_CARE_PROVIDER_SITE_OTHER): Payer: BLUE CROSS/BLUE SHIELD

## 2017-07-03 ENCOUNTER — Ambulatory Visit (INDEPENDENT_AMBULATORY_CARE_PROVIDER_SITE_OTHER): Payer: BLUE CROSS/BLUE SHIELD | Admitting: Vascular Surgery

## 2017-07-03 ENCOUNTER — Encounter (INDEPENDENT_AMBULATORY_CARE_PROVIDER_SITE_OTHER): Payer: Self-pay | Admitting: Vascular Surgery

## 2017-07-03 VITALS — BP 150/66 | HR 61 | Resp 15 | Ht 67.0 in | Wt 189.0 lb

## 2017-07-03 DIAGNOSIS — I890 Lymphedema, not elsewhere classified: Secondary | ICD-10-CM | POA: Diagnosis not present

## 2017-07-03 DIAGNOSIS — R600 Localized edema: Secondary | ICD-10-CM

## 2017-07-03 DIAGNOSIS — I872 Venous insufficiency (chronic) (peripheral): Secondary | ICD-10-CM

## 2017-07-03 DIAGNOSIS — I739 Peripheral vascular disease, unspecified: Secondary | ICD-10-CM

## 2017-07-03 NOTE — Progress Notes (Signed)
Subjective:    Patient ID: Cindy Hester, female    DOB: Nov 22, 1945, 72 y.o.   MRN: [id number] Chief Complaint  Patient presents with  . Follow-up    ABI and Bilateral reflux study u/s   Patient presents to review vascular studies.  The patient was last seen on May 14, 2017 for evaluation of right lower extremity "heaviness" and "discomfort".  The patient underwent a bilateral ABI which was notable for bilateral triphasic tibial and normal great toe waveforms.  Were bilateral resting ankle-brachial indices are within normal range.  No evidence of significant bilateral lower extremity arterial disease.  The bilateral toe brachial indices are normal.  The patient underwent a bilateral lower extremity venous reflux study which was notable for right common femoral vein reflux.  No other reflux was found to the bilateral lower extremity.  No evidence of deep vein thrombosis in the lower extremity.  No evidence of superficial venous thrombosis in the bilateral lower extremity.  Patient's symptoms are stable.  The patient has been engaging in conservative therapy since her initial visit, she has been wearing medical grade one compression socks and elevating her legs on a daily basis.  Cervidil therapy has offered minimal improvement to her symptoms.  The patient denies any nausea, vomiting or fever.  Review of Systems  Constitutional: Negative.   HENT: Negative.   Eyes: Negative.   Respiratory: Negative.   Cardiovascular: Positive for leg swelling.       Left lower extremity pain  Gastrointestinal: Negative.   Endocrine: Negative.   Genitourinary: Negative.   Musculoskeletal: Negative.   Skin: Negative.   Allergic/Immunologic: Negative.   Neurological: Negative.   Hematological: Negative.   Psychiatric/Behavioral: Negative.       Objective:   Physical Exam  Constitutional: She is oriented to person, place, and time. She appears well-developed and well-nourished. No distress.  HENT:    Head: Normocephalic and atraumatic.  Right Ear: External ear normal.  Left Ear: External ear normal.  Eyes: Pupils are equal, round, and reactive to light. Conjunctivae and EOM are normal.  Neck: Normal range of motion.  Cardiovascular: Normal rate, regular rhythm, normal heart sounds and intact distal pulses.  Pulses:      Radial pulses are 2+ on the right side, and 2+ on the left side.  Hard to palpate pedal pulses however the bilateral feet are warm  Pulmonary/Chest: Effort normal and breath sounds normal.  Musculoskeletal: Normal range of motion. She exhibits edema (Mild to moderate 1+ pitting edema noted bilaterally.).  Neurological: She is alert and oriented to person, place, and time.  Skin: Skin is warm and dry. She is not diaphoretic.  Psychiatric: She has a normal mood and affect. Her behavior is normal. Judgment and thought content normal.  Vitals reviewed.  BP (!) 150/66 (BP Location: Right Arm, Patient Position: Sitting)   Pulse 61   Resp 15   Ht 5\' 7"  (1.702 m)   Wt 189 lb (85.7 kg)   BMI 29.60 kg/m   Past Medical History:  Diagnosis Date  . Arthritis    right shoulder blade, left thumb  . Diabetes mellitus without complication ([place])   . GERD (gastroesophageal reflux disease)   . Heart murmur    followed by PCP  . High cholesterol   . History of esophagitis   . History of gastritis   . HLD (hyperlipidemia)   . Hypertension   . Sleep apnea    CPAP   Social History  Socioeconomic History  . Marital status: Single    Spouse name: Not on file  . Number of children: 1  . Years of education: master's  . Highest education level: Not on file  Occupational History  . Occupation: Training and development officer: [employer]  Social Needs  . Financial resource strain: Not on file  . Food insecurity:    Worry: Not on file    Inability: Not on file  . Transportation needs:    Medical: Not on file    Non-medical: Not on file  Tobacco Use  .  Smoking status: Never Smoker  . Smokeless tobacco: Never Used  Substance and Sexual Activity  . Alcohol use: Yes    Comment: maybe 1 drink once a month  . Drug use: No  . Sexual activity: Not on file  Lifestyle  . Physical activity:    Days per week: Not on file    Minutes per session: Not on file  . Stress: Not on file  Relationships  . Social connections:    Talks on phone: Not on file    Gets together: Not on file    Attends religious service: Not on file    Active member of club or organization: Not on file    Attends meetings of clubs or organizations: Not on file    Relationship status: Not on file  . Intimate partner violence:    Fear of current or ex partner: Not on file    Emotionally abused: Not on file    Physically abused: Not on file    Forced sexual activity: Not on file  Other Topics Concern  . Not on file  Social History Narrative  . Not on file   Past Surgical History:  Procedure Laterality Date  . BREAST CYST EXCISION Left    removed two times  . CESAREAN SECTION  1986  . CYST EXCISION     from left breast  . ESOPHAGOGASTRODUODENOSCOPY (EGD) WITH PROPOFOL N/A 12/12/2015   gastritis, LA Grade A reflux esophagitis ESOPHAGOGASTRODUODENOSCOPY (EGD) WITH PROPOFOL;  Surgeon: Manya Silvas, MD;  Location: [facility];  Service: Endoscopy;  Laterality: N/A;  Diabetic  . HAMMER TOE SURGERY Bilateral 05/30/2016   Procedure: HAMMER TOE CORRECTION RIGHT 2ND, 3RD, 4TH, 5TH TOES LEFT 5TH TOE;  Surgeon: Samara Deist, DPM;  Location: [facility];  Service: Podiatry;  Laterality: Bilateral;  . HEEL SPUR SURGERY Right 2011  . NASAL SINUS SURGERY    . TONSILLECTOMY     Family History  Problem Relation Age of Onset  . Stroke Mother   . Diabetes Mother   . Hypertension Mother   . Congestive Heart Failure Father   . Hypertension Father   . CAD Father   . Glaucoma Father   . Hyperlipidemia Brother   . Breast cancer Paternal Aunt   . Breast cancer  Paternal Aunt   . Diabetes Brother   . Heart disease Brother   . Stomach cancer Neg Hx   . Colon cancer Neg Hx    Allergies  Allergen Reactions  . Tape     Most tapes cause redness.  Paper tape and tegaderm are OK.      Assessment & Plan:  Patient presents to review vascular studies.  The patient was last seen on May 14, 2017 for evaluation of right lower extremity "heaviness" and "discomfort".  The patient underwent a bilateral ABI which was notable for bilateral triphasic tibial and  normal great toe waveforms.  Were bilateral resting ankle-brachial indices are within normal range.  No evidence of significant bilateral lower extremity arterial disease.  The bilateral toe brachial indices are normal.  The patient underwent a bilateral lower extremity venous reflux study which was notable for right common femoral vein reflux.  No other reflux was found to the bilateral lower extremity.  No evidence of deep vein thrombosis in the lower extremity.  No evidence of superficial venous thrombosis in the bilateral lower extremity.  Patient's symptoms are stable.  The patient has been engaging in conservative therapy since her initial visit, she has been wearing medical grade one compression socks and elevating her legs on a daily basis.  Cervidil therapy has offered minimal improvement to her symptoms.  The patient denies any nausea, vomiting or fever.  1. Claudication of left lower extremity ([place]) - Stable Patient does not have any significant peripheral artery disease noted on ABI. I do not feel that the patient's discomfort is stemming from any arterial insufficiency Encourage the patient to follow-up with her primary care physician for possibly x-rays of the spine and joints  2. Chronic venous insufficiency - New Patient with deep venous reflux to the right common femoral vein. Digital location of the patient's reflux she is not a candidate for any type of laser ablation or sclerotherapy. The  patient is to continue engaging conservative therapy and elevation  3. Lymphedema - New Despite conservative treatments including exercise, elevation and class I compression stockings the patient still presents with stage I lymphedema. The patient would greatly benefit from the added therapy of a lymphedema pump I will applied to the patient's insurance The patient is to engage in conservative therapy until insurance approval is obtained.  Current Outpatient Medications on File Prior to Visit  Medication Sig Dispense Refill  . AMBULATORY NON FORMULARY MEDICATION Medication Name: FDgard: Please up to 3 times a day as needed 12 capsule 0  . amLODipine (NORVASC) 10 MG tablet take 1 tablet by mouth once daily 90 tablet 3  . aspirin 81 MG tablet Take 1 tablet (81 mg total) daily by mouth. 30 tablet   . BIOTIN 5000 PO Take by mouth daily.    . calcium carbonate (OS-CAL) 600 MG TABS tablet Take by mouth.    . cholecalciferol (VITAMIN D) 1000 UNITS tablet Take 5,000 Units by mouth once a week.     . empagliflozin (JARDIANCE) 25 MG TABS tablet Take 25 mg by mouth daily.    Marland Kitchen EPINEPHrine 0.3 mg/0.3 mL IJ SOAJ injection INJ UTD  1  . fluticasone (FLONASE) 50 MCG/ACT nasal spray Place into both nostrils as needed for allergies or rhinitis.    . furosemide (LASIX) 40 MG tablet Take 20 mg by mouth as needed.     Marland Kitchen glucosamine-chondroitin (MAX GLUCOSAMINE CHONDROITIN) 500-400 MG tablet Take 1 tablet by mouth 3 (three) times daily. 30 tablet 0  . hydrALAZINE (APRESOLINE) 25 MG tablet take 1 tablet by mouth twice a day 60 tablet 12  . Liraglutide (VICTOZA) 18 MG/3ML SOPN INJECT 1.8MG  UNDER THE SKIN ONCE DAILY AS DIRECTED    . losartan-hydrochlorothiazide (HYZAAR) 100-25 MG tablet take 1 tablet by mouth once daily 30 tablet 12  . Lysine 1000 MG TABS Take by mouth daily.    . metFORMIN (GLUCOPHAGE) 1000 MG tablet Take 1,000 mg by mouth 2 (two) times daily with a meal.     . metoprolol (TOPROL-XL) 200 MG 24 hr  tablet take 1 tablet  by mouth once daily 30 tablet 12  . mometasone (NASONEX) 50 MCG/ACT nasal spray instill 1 to 2 sprays into each nostril once daily  0  . montelukast (SINGULAIR) 10 MG tablet TAKE 1 TABLET BY MOUTH DAILY. GENERIC EQUIVALENT FOR SINGULAIR. 90 tablet 3  . Multiple Vitamin (MULTIVITAMIN) tablet Take 1 tablet by mouth daily.      . Multiple Vitamins-Minerals (HAIR SKIN AND NAILS FORMULA PO) Take by mouth.    Marland Kitchen omeprazole (PRILOSEC) 20 MG capsule Take 20 mg by mouth daily.    . pantoprazole (PROTONIX) 40 MG tablet Take 1 tablet (40 mg total) by mouth every morning. 30 tablet 12  . phentermine 30 MG capsule Take 30 mg by mouth every morning.    . polyethylene glycol powder (GLYCOLAX/MIRALAX) powder Take 255 g 2 (two) times daily by mouth. 255 g 3  . rosuvastatin (CRESTOR) 20 MG tablet TAKE 1 TABLET BY MOUTH AT BEDTIME 90 tablet 3  . sucralfate (CARAFATE) 1 g tablet Take 1 tablet (1 g total) by mouth 4 (four) times daily -  with meals and at bedtime. 120 tablet 5  . valACYclovir (VALTREX) 500 MG tablet take 1 tablet by mouth twice a day if needed 60 tablet 3  . vitamin B-12 (CYANOCOBALAMIN) 1000 MCG tablet Take 1,000 mcg by mouth daily.     No current facility-administered medications on file prior to visit.    There are no Patient Instructions on file for this visit. No follow-ups on file.  Addam Goeller A Shiniqua Groseclose, PA-C

## 2017-07-09 DIAGNOSIS — J3089 Other allergic rhinitis: Secondary | ICD-10-CM | POA: Diagnosis not present

## 2017-07-09 DIAGNOSIS — J301 Allergic rhinitis due to pollen: Secondary | ICD-10-CM | POA: Diagnosis not present

## 2017-07-11 DIAGNOSIS — G4730 Sleep apnea, unspecified: Secondary | ICD-10-CM | POA: Diagnosis not present

## 2017-07-16 DIAGNOSIS — J301 Allergic rhinitis due to pollen: Secondary | ICD-10-CM | POA: Diagnosis not present

## 2017-07-16 DIAGNOSIS — J3089 Other allergic rhinitis: Secondary | ICD-10-CM | POA: Diagnosis not present

## 2017-07-21 DIAGNOSIS — M25531 Pain in right wrist: Secondary | ICD-10-CM | POA: Diagnosis not present

## 2017-07-21 DIAGNOSIS — L230 Allergic contact dermatitis due to metals: Secondary | ICD-10-CM | POA: Diagnosis not present

## 2017-07-21 DIAGNOSIS — M19031 Primary osteoarthritis, right wrist: Secondary | ICD-10-CM | POA: Diagnosis not present

## 2017-07-22 ENCOUNTER — Telehealth: Payer: Self-pay

## 2017-07-22 NOTE — Telephone Encounter (Signed)
Patient was called to ask about her last sleep study.  She reports that Dr Delilah Shan did it almost 20 years ago.  She is going to try and talk to [facility] since they had been her original provider.  She will let us know what if anything else she wants [place] to do.

## 2017-07-24 DIAGNOSIS — I890 Lymphedema, not elsewhere classified: Secondary | ICD-10-CM | POA: Diagnosis not present

## 2017-07-30 ENCOUNTER — Ambulatory Visit: Payer: BLUE CROSS/BLUE SHIELD | Admitting: Family Medicine

## 2017-07-30 ENCOUNTER — Encounter: Payer: Self-pay | Admitting: Family Medicine

## 2017-07-30 VITALS — BP 132/64 | HR 72 | Temp 99.1°F | Resp 16 | Ht 67.0 in | Wt 196.0 lb

## 2017-07-30 DIAGNOSIS — L509 Urticaria, unspecified: Secondary | ICD-10-CM | POA: Diagnosis not present

## 2017-07-30 MED ORDER — PREDNISONE 10 MG (21) PO TBPK: ORAL_TABLET | ORAL | 0 refills | Status: DC

## 2017-07-30 MED ORDER — DIPHENHYDRAMINE HCL 25 MG PO TABS: 25.0000 mg | ORAL_TABLET | Freq: Every evening | ORAL | 0 refills | Status: DC | PRN

## 2017-07-30 MED ORDER — CETIRIZINE HCL 10 MG PO TABS: 10.0000 mg | ORAL_TABLET | Freq: Every day | ORAL | 11 refills | Status: DC

## 2017-07-30 NOTE — Progress Notes (Signed)
Patient: Cindy Hester Female    DOB: May 06, 1945   72 y.o.   MRN: [id number] Visit Date: 07/30/2017  Today's Provider: Wilhemena Durie, MD   Chief Complaint  Patient presents with  . Rash   Subjective:    Rash  This is a new problem. The current episode started in the past 7 days. The problem has been gradually worsening since onset. The affected locations include the right wrist, groin and abdomen. The rash is characterized by itchiness, dryness and redness. She was exposed to nothing. Pertinent negatives include no fatigue, fever or shortness of breath.   Patient reports that she went to [facility] about 1 week ago and was treated for similar symptoms. She was treated with Doxy and Desonide cream. Patient reports that her rash has not gotten any better.   Allergies  Allergen Reactions  . Tape     Most tapes cause redness.  Paper tape and tegaderm are OK.     Current Outpatient Medications:  .  AMBULATORY NON FORMULARY MEDICATION, Medication Name: FDgard: Please up to 3 times a day as needed, Disp: 12 capsule, Rfl: 0 .  amLODipine (NORVASC) 10 MG tablet, take 1 tablet by mouth once daily, Disp: 90 tablet, Rfl: 3 .  aspirin 81 MG tablet, Take 1 tablet (81 mg total) daily by mouth., Disp: 30 tablet, Rfl:  .  BIOTIN 5000 PO, Take by mouth daily., Disp: , Rfl:  .  calcium carbonate (OS-CAL) 600 MG TABS tablet, Take by mouth., Disp: , Rfl:  .  cholecalciferol (VITAMIN D) 1000 UNITS tablet, Take 5,000 Units by mouth once a week. , Disp: , Rfl:  .  empagliflozin (JARDIANCE) 25 MG TABS tablet, Take 25 mg by mouth daily., Disp: , Rfl:  .  EPINEPHrine 0.3 mg/0.3 mL IJ SOAJ injection, INJ UTD, Disp: , Rfl: 1 .  fluticasone (FLONASE) 50 MCG/ACT nasal spray, Place into both nostrils as needed for allergies or rhinitis., Disp: , Rfl:  .  furosemide (LASIX) 40 MG tablet, Take 20 mg by mouth as needed. , Disp: , Rfl:  .  glucosamine-chondroitin (MAX GLUCOSAMINE CHONDROITIN)  500-400 MG tablet, Take 1 tablet by mouth 3 (three) times daily., Disp: 30 tablet, Rfl: 0 .  hydrALAZINE (APRESOLINE) 25 MG tablet, take 1 tablet by mouth twice a day, Disp: 60 tablet, Rfl: 12 .  Liraglutide (VICTOZA) 18 MG/3ML SOPN, INJECT 1.8MG  UNDER THE SKIN ONCE DAILY AS DIRECTED, Disp: , Rfl:  .  losartan-hydrochlorothiazide (HYZAAR) 100-25 MG tablet, take 1 tablet by mouth once daily, Disp: 30 tablet, Rfl: 12 .  Lysine 1000 MG TABS, Take by mouth daily., Disp: , Rfl:  .  metFORMIN (GLUCOPHAGE) 1000 MG tablet, Take 1,000 mg by mouth 2 (two) times daily with a meal. , Disp: , Rfl:  .  metoprolol (TOPROL-XL) 200 MG 24 hr tablet, take 1 tablet by mouth once daily, Disp: 30 tablet, Rfl: 12 .  mometasone (NASONEX) 50 MCG/ACT nasal spray, instill 1 to 2 sprays into each nostril once daily, Disp: , Rfl: 0 .  montelukast (SINGULAIR) 10 MG tablet, TAKE 1 TABLET BY MOUTH DAILY. GENERIC EQUIVALENT FOR SINGULAIR., Disp: 90 tablet, Rfl: 3 .  Multiple Vitamin (MULTIVITAMIN) tablet, Take 1 tablet by mouth daily.  , Disp: , Rfl:  .  Multiple Vitamins-Minerals (HAIR SKIN AND NAILS FORMULA PO), Take by mouth., Disp: , Rfl:  .  omeprazole (PRILOSEC) 20 MG capsule, Take 20 mg by mouth daily., Disp: ,  Rfl:  .  pantoprazole (PROTONIX) 40 MG tablet, Take 1 tablet (40 mg total) by mouth every morning., Disp: 30 tablet, Rfl: 12 .  phentermine 30 MG capsule, Take 30 mg by mouth every morning., Disp: , Rfl:  .  polyethylene glycol powder (GLYCOLAX/MIRALAX) powder, Take 255 g 2 (two) times daily by mouth., Disp: 255 g, Rfl: 3 .  rosuvastatin (CRESTOR) 20 MG tablet, TAKE 1 TABLET BY MOUTH AT BEDTIME, Disp: 90 tablet, Rfl: 3 .  sucralfate (CARAFATE) 1 g tablet, Take 1 tablet (1 g total) by mouth 4 (four) times daily -  with meals and at bedtime., Disp: 120 tablet, Rfl: 5 .  valACYclovir (VALTREX) 500 MG tablet, take 1 tablet by mouth twice a day if needed, Disp: 60 tablet, Rfl: 3 .  vitamin B-12 (CYANOCOBALAMIN) 1000 MCG  tablet, Take 1,000 mcg by mouth daily., Disp: , Rfl:   Review of Systems  Constitutional: Negative for activity change, appetite change, chills, diaphoresis, fatigue, fever and unexpected weight change.  Respiratory: Negative for shortness of breath and wheezing.   Cardiovascular: Negative.   Gastrointestinal: Negative.   Genitourinary: Negative.   Musculoskeletal: Negative.   Skin: Positive for rash.  Allergic/Immunologic: Negative for environmental allergies.  Neurological: Negative.   Psychiatric/Behavioral: Negative.     Social History   Tobacco Use  . Smoking status: Never Smoker  . Smokeless tobacco: Never Used  Substance Use Topics  . Alcohol use: Yes    Comment: maybe 1 drink once a month   Objective:   BP 132/64 (BP Location: Left Arm, Patient Position: Sitting, Cuff Size: Normal)   Pulse 72   Temp 99.1 F (37.3 C)   Resp 16   Ht 5\' 7"  (1.702 m)   Wt 196 lb (88.9 kg)   SpO2 96%   BMI 30.70 kg/m  Vitals:   07/30/17 0948  BP: 132/64  Pulse: 72  Resp: 16  Temp: 99.1 F (37.3 C)  SpO2: 96%  Weight: 196 lb (88.9 kg)  Height: 5\' 7"  (1.702 m)     Physical Exam  Constitutional: She is oriented to person, place, and time. She appears well-developed and well-nourished.  HENT:  Head: Normocephalic and atraumatic.  Eyes: Conjunctivae are normal. No scleral icterus.  Neck: No thyromegaly present.  Cardiovascular: Normal rate, regular rhythm and normal heart sounds.  Pulmonary/Chest: Effort normal and breath sounds normal.  Abdominal: Soft.  Lymphadenopathy:    She has no cervical adenopathy.  Neurological: She is alert and oriented to person, place, and time.  Skin: Skin is warm and dry. Rash noted.  Urticarial rash.No blistering or vesicles.  Psychiatric: She has a normal mood and affect. Her behavior is normal. Judgment and thought content normal.        Assessment & Plan:     1. Urticaria Consider Doxepine or Derm referral. - predniSONE (STERAPRED  UNI-PAK 21 TAB) 10 MG (21) TBPK tablet; Taper as directed.  Dispense: 21 tablet; Refill: 0 - cetirizine (ZYRTEC) 10 MG tablet; Take 1 tablet (10 mg total) by mouth daily.  Dispense: 30 tablet; Refill: 11 - diphenhydrAMINE (BENADRYL ALLERGY) 25 MG tablet; Take 1 tablet (25 mg total) by mouth at bedtime as needed.  Dispense: 30 tablet; Refill: 0 2.TIIDM  I have done the exam and reviewed the chart and it is accurate to the best of my knowledge. Development worker, community has been used and  any errors in dictation or transcription are unintentional. Miguel Aschoff M.D. [facility]       Wilhemena Durie, MD  [facility]

## 2017-07-30 NOTE — Patient Instructions (Signed)
Take Benadryl as needed.   Also take Zyrtec daily.

## 2017-08-05 DIAGNOSIS — E119 Type 2 diabetes mellitus without complications: Secondary | ICD-10-CM | POA: Diagnosis not present

## 2017-08-05 LAB — HM DIABETES EYE EXAM

## 2017-08-06 DIAGNOSIS — G4733 Obstructive sleep apnea (adult) (pediatric): Secondary | ICD-10-CM | POA: Diagnosis not present

## 2017-08-06 DIAGNOSIS — J301 Allergic rhinitis due to pollen: Secondary | ICD-10-CM | POA: Diagnosis not present

## 2017-08-06 DIAGNOSIS — J3089 Other allergic rhinitis: Secondary | ICD-10-CM | POA: Diagnosis not present

## 2017-08-13 DIAGNOSIS — J301 Allergic rhinitis due to pollen: Secondary | ICD-10-CM | POA: Diagnosis not present

## 2017-08-13 DIAGNOSIS — J3089 Other allergic rhinitis: Secondary | ICD-10-CM | POA: Diagnosis not present

## 2017-08-23 DIAGNOSIS — I890 Lymphedema, not elsewhere classified: Secondary | ICD-10-CM | POA: Diagnosis not present

## 2017-09-02 DIAGNOSIS — M1812 Unilateral primary osteoarthritis of first carpometacarpal joint, left hand: Secondary | ICD-10-CM | POA: Diagnosis not present

## 2017-09-02 DIAGNOSIS — M79645 Pain in left finger(s): Secondary | ICD-10-CM | POA: Diagnosis not present

## 2017-09-02 DIAGNOSIS — M25531 Pain in right wrist: Secondary | ICD-10-CM | POA: Diagnosis not present

## 2017-09-02 DIAGNOSIS — M189 Osteoarthritis of first carpometacarpal joint, unspecified: Secondary | ICD-10-CM | POA: Insufficient documentation

## 2017-09-03 DIAGNOSIS — J301 Allergic rhinitis due to pollen: Secondary | ICD-10-CM | POA: Diagnosis not present

## 2017-09-03 DIAGNOSIS — J3089 Other allergic rhinitis: Secondary | ICD-10-CM | POA: Diagnosis not present

## 2017-09-13 DIAGNOSIS — J301 Allergic rhinitis due to pollen: Secondary | ICD-10-CM | POA: Diagnosis not present

## 2017-09-13 DIAGNOSIS — J3089 Other allergic rhinitis: Secondary | ICD-10-CM | POA: Diagnosis not present

## 2017-09-19 DIAGNOSIS — J301 Allergic rhinitis due to pollen: Secondary | ICD-10-CM | POA: Diagnosis not present

## 2017-09-19 DIAGNOSIS — J3089 Other allergic rhinitis: Secondary | ICD-10-CM | POA: Diagnosis not present

## 2017-09-23 DIAGNOSIS — I890 Lymphedema, not elsewhere classified: Secondary | ICD-10-CM | POA: Diagnosis not present

## 2017-09-30 DIAGNOSIS — M1812 Unilateral primary osteoarthritis of first carpometacarpal joint, left hand: Secondary | ICD-10-CM | POA: Diagnosis not present

## 2017-09-30 DIAGNOSIS — S63599A Other specified sprain of unspecified wrist, initial encounter: Secondary | ICD-10-CM | POA: Insufficient documentation

## 2017-09-30 DIAGNOSIS — M79645 Pain in left finger(s): Secondary | ICD-10-CM | POA: Diagnosis not present

## 2017-09-30 DIAGNOSIS — M25531 Pain in right wrist: Secondary | ICD-10-CM | POA: Diagnosis not present

## 2017-09-30 DIAGNOSIS — S63591D Other specified sprain of right wrist, subsequent encounter: Secondary | ICD-10-CM | POA: Diagnosis not present

## 2017-10-01 DIAGNOSIS — J3089 Other allergic rhinitis: Secondary | ICD-10-CM | POA: Diagnosis not present

## 2017-10-01 DIAGNOSIS — J301 Allergic rhinitis due to pollen: Secondary | ICD-10-CM | POA: Diagnosis not present

## 2017-10-08 DIAGNOSIS — J3089 Other allergic rhinitis: Secondary | ICD-10-CM | POA: Diagnosis not present

## 2017-10-09 DIAGNOSIS — E119 Type 2 diabetes mellitus without complications: Secondary | ICD-10-CM | POA: Diagnosis not present

## 2017-10-09 DIAGNOSIS — E781 Pure hyperglyceridemia: Secondary | ICD-10-CM | POA: Diagnosis not present

## 2017-10-09 DIAGNOSIS — E559 Vitamin D deficiency, unspecified: Secondary | ICD-10-CM | POA: Diagnosis not present

## 2017-10-09 DIAGNOSIS — E538 Deficiency of other specified B group vitamins: Secondary | ICD-10-CM | POA: Diagnosis not present

## 2017-10-15 DIAGNOSIS — J3089 Other allergic rhinitis: Secondary | ICD-10-CM | POA: Diagnosis not present

## 2017-10-16 DIAGNOSIS — E1169 Type 2 diabetes mellitus with other specified complication: Secondary | ICD-10-CM | POA: Diagnosis not present

## 2017-10-16 DIAGNOSIS — E1159 Type 2 diabetes mellitus with other circulatory complications: Secondary | ICD-10-CM | POA: Diagnosis not present

## 2017-10-16 DIAGNOSIS — E538 Deficiency of other specified B group vitamins: Secondary | ICD-10-CM | POA: Diagnosis not present

## 2017-10-16 DIAGNOSIS — E119 Type 2 diabetes mellitus without complications: Secondary | ICD-10-CM | POA: Diagnosis not present

## 2017-10-16 DIAGNOSIS — G4730 Sleep apnea, unspecified: Secondary | ICD-10-CM | POA: Diagnosis not present

## 2017-10-20 ENCOUNTER — Other Ambulatory Visit: Payer: Self-pay | Admitting: Family Medicine

## 2017-10-20 DIAGNOSIS — E785 Hyperlipidemia, unspecified: Secondary | ICD-10-CM

## 2017-10-22 DIAGNOSIS — J301 Allergic rhinitis due to pollen: Secondary | ICD-10-CM | POA: Diagnosis not present

## 2017-10-22 DIAGNOSIS — J3089 Other allergic rhinitis: Secondary | ICD-10-CM | POA: Diagnosis not present

## 2017-10-29 DIAGNOSIS — J3089 Other allergic rhinitis: Secondary | ICD-10-CM | POA: Diagnosis not present

## 2017-10-29 DIAGNOSIS — J301 Allergic rhinitis due to pollen: Secondary | ICD-10-CM | POA: Diagnosis not present

## 2017-11-04 ENCOUNTER — Ambulatory Visit: Payer: BLUE CROSS/BLUE SHIELD | Admitting: Family Medicine

## 2017-11-04 ENCOUNTER — Encounter: Payer: Self-pay | Admitting: Family Medicine

## 2017-11-04 VITALS — BP 128/68 | HR 70 | Temp 98.5°F | Resp 16 | Ht 67.0 in | Wt 186.0 lb

## 2017-11-04 DIAGNOSIS — E782 Mixed hyperlipidemia: Secondary | ICD-10-CM | POA: Diagnosis not present

## 2017-11-04 DIAGNOSIS — Z794 Long term (current) use of insulin: Secondary | ICD-10-CM | POA: Diagnosis not present

## 2017-11-04 DIAGNOSIS — Z23 Encounter for immunization: Secondary | ICD-10-CM | POA: Diagnosis not present

## 2017-11-04 DIAGNOSIS — G4733 Obstructive sleep apnea (adult) (pediatric): Secondary | ICD-10-CM

## 2017-11-04 DIAGNOSIS — R011 Cardiac murmur, unspecified: Secondary | ICD-10-CM

## 2017-11-04 DIAGNOSIS — E118 Type 2 diabetes mellitus with unspecified complications: Secondary | ICD-10-CM

## 2017-11-04 DIAGNOSIS — I872 Venous insufficiency (chronic) (peripheral): Secondary | ICD-10-CM

## 2017-11-04 DIAGNOSIS — I10 Essential (primary) hypertension: Secondary | ICD-10-CM

## 2017-11-04 NOTE — Progress Notes (Signed)
Patient: Cindy Hester Female    DOB: 1945-06-21   72 y.o.   MRN: [id number] Visit Date: 11/04/2017  Today's Provider: Wilhemena Durie, MD   Chief Complaint  Patient presents with  . Hypertension  . Diabetes   Subjective:    HPI    Hypertension, follow-up:  BP Readings from Last 3 Encounters:  11/04/17 128/68  07/30/17 132/64  07/03/17 (!) 150/66    She was last seen for hypertension 6 months ago.  BP at that visit was 132/64. Management since that visit includes no changes. She reports good compliance with treatment. She is not having side effects.  She is not exercising. She is not adherent to low salt diet.   Outside blood pressures are checked daily. She is experiencing none.  Patient denies chest pressure/discomfort, exertional chest pressure/discomfort and lower extremity edema.   Cardiovascular risk factors include diabetes mellitus and dyslipidemia.   Weight trend: decreasing steadily Wt Readings from Last 3 Encounters:  11/04/17 186 lb (84.4 kg)  07/30/17 196 lb (88.9 kg)  07/03/17 189 lb (85.7 kg)    Current diet: well balanced   Diabetes Mellitus Type II, Follow-up:   Lab Results  Component Value Date   HGBA1C 7.2 09/11/2016   HGBA1C 6.7 03/14/2016   HGBA1C 6.6 03/08/2015    Patient has this followed by endocrinology. She reports that her last HbgA1c was 8.0 on 10/16/17.   She has had multiple GI complaints for some time and has been seen by Dr. Vira Agar, Dr. Havery Moros with [facility] and at [facility]. 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  She claims belching, flatulence, constipation, diarrhea, cramping.   Allergies  Allergen Reactions  . Tape     Most tapes cause redness.  Paper tape and tegaderm are OK.     Current Outpatient Medications:  .  AMBULATORY NON FORMULARY MEDICATION, Medication Name: FDgard: Please up to 3 times a day as needed, Disp: 12 capsule, Rfl: 0 .   amLODipine (NORVASC) 10 MG tablet, take 1 tablet by mouth once daily, Disp: 90 tablet, Rfl: 3 .  aspirin 81 MG tablet, Take 1 tablet (81 mg total) daily by mouth., Disp: 30 tablet, Rfl:  .  BIOTIN 5000 PO, Take by mouth daily., Disp: , Rfl:  .  calcium carbonate (OS-CAL) 600 MG TABS tablet, Take by mouth., Disp: , Rfl:  .  cetirizine (ZYRTEC) 10 MG tablet, Take 1 tablet (10 mg total) by mouth daily., Disp: 30 tablet, Rfl: 11 .  cholecalciferol (VITAMIN D) 1000 UNITS tablet, Take 5,000 Units by mouth once a week. , Disp: , Rfl:  .  diphenhydrAMINE (BENADRYL ALLERGY) 25 MG tablet, Take 1 tablet (25 mg total) by mouth at bedtime as needed., Disp: 30 tablet, Rfl: 0 .  empagliflozin (JARDIANCE) 25 MG TABS tablet, Take 25 mg by mouth daily., Disp: , Rfl:  .  EPINEPHrine 0.3 mg/0.3 mL IJ SOAJ injection, INJ UTD, Disp: , Rfl: 1 .  fluticasone (FLONASE) 50 MCG/ACT nasal spray, Place into both nostrils as needed for allergies or rhinitis., Disp: , Rfl:  .  furosemide (LASIX) 40 MG tablet, Take 20 mg by mouth as needed. , Disp: , Rfl:  .  glucosamine-chondroitin (MAX GLUCOSAMINE CHONDROITIN) 500-400 MG tablet, Take 1 tablet by mouth 3 (three) times daily., Disp: 30 tablet, Rfl: 0 .  hydrALAZINE (APRESOLINE) 25 MG tablet, take 1 tablet by mouth twice a day, Disp:  60 tablet, Rfl: 12 .  Liraglutide (VICTOZA) 18 MG/3ML SOPN, INJECT 1.8MG  UNDER THE SKIN ONCE DAILY AS DIRECTED, Disp: , Rfl:  .  losartan-hydrochlorothiazide (HYZAAR) 100-25 MG tablet, take 1 tablet by mouth once daily, Disp: 30 tablet, Rfl: 12 .  Lysine 1000 MG TABS, Take by mouth daily., Disp: , Rfl:  .  meloxicam (MOBIC) 7.5 MG tablet, Take 1 tablet by mouth daily., Disp: , Rfl: 0 .  metFORMIN (GLUCOPHAGE) 1000 MG tablet, Take 1,000 mg by mouth 2 (two) times daily with a meal. , Disp: , Rfl:  .  metoprolol (TOPROL-XL) 200 MG 24 hr tablet, take 1 tablet by mouth once daily, Disp: 30 tablet, Rfl: 12 .  mometasone (NASONEX) 50 MCG/ACT nasal spray,  instill 1 to 2 sprays into each nostril once daily, Disp: , Rfl: 0 .  montelukast (SINGULAIR) 10 MG tablet, TAKE 1 TABLET BY MOUTH DAILY. GENERIC EQUIVALENT FOR SINGULAIR., Disp: 90 tablet, Rfl: 3 .  Multiple Vitamin (MULTIVITAMIN) tablet, Take 1 tablet by mouth daily.  , Disp: , Rfl:  .  Multiple Vitamins-Minerals (HAIR SKIN AND NAILS FORMULA PO), Take by mouth., Disp: , Rfl:  .  omeprazole (PRILOSEC) 20 MG capsule, Take 20 mg by mouth daily., Disp: , Rfl:  .  phentermine 30 MG capsule, Take 30 mg by mouth every morning., Disp: , Rfl:  .  polyethylene glycol powder (GLYCOLAX/MIRALAX) powder, Take 255 g 2 (two) times daily by mouth., Disp: 255 g, Rfl: 3 .  rosuvastatin (CRESTOR) 20 MG tablet, TAKE 1 TABLET BY MOUTH AT BEDTIME, Disp: 90 tablet, Rfl: 3 .  sucralfate (CARAFATE) 1 g tablet, Take 1 tablet (1 g total) by mouth 4 (four) times daily -  with meals and at bedtime., Disp: 120 tablet, Rfl: 5 .  valACYclovir (VALTREX) 500 MG tablet, take 1 tablet by mouth twice a day if needed, Disp: 60 tablet, Rfl: 3 .  vitamin B-12 (CYANOCOBALAMIN) 1000 MCG tablet, Take 1,000 mcg by mouth daily., Disp: , Rfl:  .  pantoprazole (PROTONIX) 40 MG tablet, Take 1 tablet (40 mg total) by mouth every morning. (Patient not taking: Reported on 11/04/2017), Disp: 30 tablet, Rfl: 12 .  predniSONE (STERAPRED UNI-PAK 21 TAB) 10 MG (21) TBPK tablet, Taper as directed. (Patient not taking: Reported on 11/04/2017), Disp: 21 tablet, Rfl: 0  Review of Systems  Constitutional: Negative for activity change, appetite change, chills, diaphoresis, fatigue, fever and unexpected weight change.  Respiratory: Negative for cough and shortness of breath.   Cardiovascular: Negative for chest pain and leg swelling.  Gastrointestinal: Positive for abdominal pain, constipation, diarrhea and nausea.  Endocrine: Negative for cold intolerance, heat intolerance, polydipsia, polyphagia and polyuria.  Genitourinary: Negative.   Musculoskeletal:  Positive for arthralgias.  Skin: Negative.   Neurological: Negative for dizziness, weakness, light-headedness and headaches.  Psychiatric/Behavioral: Negative.     Social History   Tobacco Use  . Smoking status: Never Smoker  . Smokeless tobacco: Never Used  Substance Use Topics  . Alcohol use: Yes    Comment: maybe 1 drink once a month   Objective:   BP 128/68 (BP Location: Right Arm, Patient Position: Sitting, Cuff Size: Large)   Pulse 70   Temp 98.5 F (36.9 C)   Resp 16   Ht 5\' 7"  (1.702 m)   Wt 186 lb (84.4 kg)   SpO2 98%   BMI 29.13 kg/m  Vitals:   11/04/17 0812  BP: 128/68  Pulse: 70  Resp: 16  Temp: 98.5 F (  36.9 C)  SpO2: 98%  Weight: 186 lb (84.4 kg)  Height: 5\' 7"  (1.702 m)     Physical Exam  Constitutional: She is oriented to person, place, and time. She appears well-developed and well-nourished.  HENT:  Head: Normocephalic and atraumatic.  Right Ear: External ear normal.  Left Ear: External ear normal.  Nose: Nose normal.  Eyes: Conjunctivae are normal. No scleral icterus.  Neck: No thyromegaly present.  Cardiovascular: Normal rate and regular rhythm.  Murmur heard. Soft 2/6 systolic Murmur  Pulmonary/Chest: Effort normal and breath sounds normal.  Abdominal: Soft.  Musculoskeletal: She exhibits no edema.  Lymphadenopathy:    She has no cervical adenopathy.  Neurological: She is alert and oriented to person, place, and time.  Skin: Skin is warm and dry.  Psychiatric: She has a normal mood and affect. Her behavior is normal. Judgment and thought content normal.        Assessment & Plan:     1. Essential hypertension RTC 6 months.- Comprehensive metabolic panel - TSH  2. Mixed hyperlipidemia  - Lipid panel  3. Type 2 diabetes mellitus with complication, with long-term current use of insulin (HCC)  - CBC with Differential/Platelet  4. Flu vaccine need  - Flu vaccine HIGH DOSE PF (Fluzone High dose)  5. Heart murmur This is a  chronic murmur but patient concerned about family history of valve disease and heart disease.Marland Kitchen/CAD - Ambulatory referral to Cardiology  6. Essential hypertension, benign   7. Chronic venous insufficiency   8. Obstructive apnea  9.GI Try OTC probiotic.  She has had  complete work-up for this and sees GI.      I have done the exam and reviewed the above chart and it is accurate to the best of my knowledge. Development worker, community has been used in this note in any air is in the dictation or transcription are unintentional.  Wilhemena Durie, MD  [place]

## 2017-11-05 ENCOUNTER — Other Ambulatory Visit: Payer: Self-pay | Admitting: Family Medicine

## 2017-11-05 DIAGNOSIS — J3089 Other allergic rhinitis: Secondary | ICD-10-CM | POA: Diagnosis not present

## 2017-11-05 DIAGNOSIS — J301 Allergic rhinitis due to pollen: Secondary | ICD-10-CM | POA: Diagnosis not present

## 2017-11-05 LAB — COMPREHENSIVE METABOLIC PANEL
ALBUMIN: 4.5 g/dL (ref 3.5–4.8)
ALK PHOS: 72 IU/L (ref 39–117)
ALT: 14 IU/L (ref 0–32)
AST: 13 IU/L (ref 0–40)
Albumin/Globulin Ratio: 2.3 — ABNORMAL HIGH (ref 1.2–2.2)
BUN / CREAT RATIO: 26 (ref 12–28)
BUN: 19 mg/dL (ref 8–27)
Bilirubin Total: 0.4 mg/dL (ref 0.0–1.2)
CALCIUM: 10.1 mg/dL (ref 8.7–10.3)
CO2: 27 mmol/L (ref 20–29)
CREATININE: 0.73 mg/dL (ref 0.57–1.00)
Chloride: 97 mmol/L (ref 96–106)
GFR, EST AFRICAN AMERICAN: 96 mL/min/{1.73_m2} (ref >59)
GFR, EST NON AFRICAN AMERICAN: 84 mL/min/{1.73_m2} (ref >59)
GLOBULIN, TOTAL: 2 g/dL (ref 1.5–4.5)
GLUCOSE: 148 mg/dL — AB (ref 65–99)
Potassium: 4.7 mmol/L (ref 3.5–5.2)
SODIUM: 139 mmol/L (ref 134–144)
TOTAL PROTEIN: 6.5 g/dL (ref 6.0–8.5)

## 2017-11-05 LAB — CBC WITH DIFFERENTIAL/PLATELET
BASOS ABS: 0 10*3/uL (ref 0.0–0.2)
Basos: 0 %
EOS (ABSOLUTE): 0.2 10*3/uL (ref 0.0–0.4)
Eos: 2 %
HEMATOCRIT: 43.9 % (ref 34.0–46.6)
HEMOGLOBIN: 14.7 g/dL (ref 11.1–15.9)
IMMATURE GRANS (ABS): 0 10*3/uL (ref 0.0–0.1)
IMMATURE GRANULOCYTES: 0 %
LYMPHS ABS: 1.7 10*3/uL (ref 0.7–3.1)
LYMPHS: 22 %
MCH: 28.4 pg (ref 26.6–33.0)
MCHC: 33.5 g/dL (ref 31.5–35.7)
MCV: 85 fL (ref 79–97)
MONOCYTES: 10 %
Monocytes Absolute: 0.8 10*3/uL (ref 0.1–0.9)
Neutrophils Absolute: 5 10*3/uL (ref 1.4–7.0)
Neutrophils: 66 %
Platelets: 315 10*3/uL (ref 150–450)
RBC: 5.18 x10E6/uL (ref 3.77–5.28)
RDW: 14.3 % (ref 12.3–15.4)
WBC: 7.7 10*3/uL (ref 3.4–10.8)

## 2017-11-05 LAB — LIPID PANEL
CHOL/HDL RATIO: 2.7 ratio (ref 0.0–4.4)
CHOLESTEROL TOTAL: 146 mg/dL (ref 100–199)
HDL: 54 mg/dL (ref >39)
LDL CALC: 51 mg/dL (ref 0–99)
Triglycerides: 205 mg/dL — ABNORMAL HIGH (ref 0–149)
VLDL Cholesterol Cal: 41 mg/dL — ABNORMAL HIGH (ref 5–40)

## 2017-11-05 LAB — TSH: TSH: 1.16 u[IU]/mL (ref 0.450–4.500)

## 2017-11-05 MED ORDER — HYDRALAZINE HCL 25 MG PO TABS: 25.0000 mg | ORAL_TABLET | Freq: Two times a day (BID) | ORAL | 12 refills | Status: DC

## 2017-11-05 NOTE — Telephone Encounter (Signed)
[facility] faxed refill request for the following medications:  hydrALAZINE (APRESOLINE) 25 MG tablet  Qty: 60  Last refill: 11/04/2017  Please advise.

## 2017-11-12 DIAGNOSIS — J3089 Other allergic rhinitis: Secondary | ICD-10-CM | POA: Diagnosis not present

## 2017-11-12 DIAGNOSIS — J301 Allergic rhinitis due to pollen: Secondary | ICD-10-CM | POA: Diagnosis not present

## 2017-11-19 DIAGNOSIS — J3089 Other allergic rhinitis: Secondary | ICD-10-CM | POA: Diagnosis not present

## 2017-11-19 DIAGNOSIS — J301 Allergic rhinitis due to pollen: Secondary | ICD-10-CM | POA: Diagnosis not present

## 2017-11-26 DIAGNOSIS — J3089 Other allergic rhinitis: Secondary | ICD-10-CM | POA: Diagnosis not present

## 2017-11-26 DIAGNOSIS — J301 Allergic rhinitis due to pollen: Secondary | ICD-10-CM | POA: Diagnosis not present

## 2017-12-02 DIAGNOSIS — E1165 Type 2 diabetes mellitus with hyperglycemia: Secondary | ICD-10-CM | POA: Diagnosis not present

## 2017-12-02 DIAGNOSIS — R011 Cardiac murmur, unspecified: Secondary | ICD-10-CM | POA: Diagnosis not present

## 2017-12-02 DIAGNOSIS — I10 Essential (primary) hypertension: Secondary | ICD-10-CM | POA: Diagnosis not present

## 2017-12-02 DIAGNOSIS — R0609 Other forms of dyspnea: Secondary | ICD-10-CM | POA: Diagnosis not present

## 2017-12-03 DIAGNOSIS — J301 Allergic rhinitis due to pollen: Secondary | ICD-10-CM | POA: Diagnosis not present

## 2017-12-03 DIAGNOSIS — J3089 Other allergic rhinitis: Secondary | ICD-10-CM | POA: Diagnosis not present

## 2017-12-06 ENCOUNTER — Other Ambulatory Visit: Payer: Self-pay

## 2017-12-06 MED ORDER — HYDROCHLOROTHIAZIDE 25 MG PO TABS: 25.0000 mg | ORAL_TABLET | Freq: Every day | ORAL | 3 refills | Status: DC

## 2017-12-06 MED ORDER — LOSARTAN POTASSIUM 100 MG PO TABS: 100.0000 mg | ORAL_TABLET | Freq: Every day | ORAL | 3 refills | Status: DC

## 2017-12-07 ENCOUNTER — Other Ambulatory Visit: Payer: Self-pay | Admitting: Family Medicine

## 2017-12-12 DIAGNOSIS — J3089 Other allergic rhinitis: Secondary | ICD-10-CM | POA: Diagnosis not present

## 2017-12-12 DIAGNOSIS — J301 Allergic rhinitis due to pollen: Secondary | ICD-10-CM | POA: Diagnosis not present

## 2017-12-12 DIAGNOSIS — R0609 Other forms of dyspnea: Secondary | ICD-10-CM | POA: Diagnosis not present

## 2017-12-17 DIAGNOSIS — Z01419 Encounter for gynecological examination (general) (routine) without abnormal findings: Secondary | ICD-10-CM | POA: Diagnosis not present

## 2017-12-17 DIAGNOSIS — Z124 Encounter for screening for malignant neoplasm of cervix: Secondary | ICD-10-CM | POA: Diagnosis not present

## 2017-12-17 DIAGNOSIS — J301 Allergic rhinitis due to pollen: Secondary | ICD-10-CM | POA: Diagnosis not present

## 2017-12-17 DIAGNOSIS — J3089 Other allergic rhinitis: Secondary | ICD-10-CM | POA: Diagnosis not present

## 2017-12-20 DIAGNOSIS — R0609 Other forms of dyspnea: Secondary | ICD-10-CM | POA: Diagnosis not present

## 2017-12-20 DIAGNOSIS — R011 Cardiac murmur, unspecified: Secondary | ICD-10-CM | POA: Diagnosis not present

## 2017-12-24 DIAGNOSIS — J301 Allergic rhinitis due to pollen: Secondary | ICD-10-CM | POA: Diagnosis not present

## 2017-12-24 DIAGNOSIS — J3089 Other allergic rhinitis: Secondary | ICD-10-CM | POA: Diagnosis not present

## 2017-12-27 DIAGNOSIS — R0989 Other specified symptoms and signs involving the circulatory and respiratory systems: Secondary | ICD-10-CM | POA: Diagnosis not present

## 2017-12-27 DIAGNOSIS — R011 Cardiac murmur, unspecified: Secondary | ICD-10-CM | POA: Diagnosis not present

## 2017-12-31 DIAGNOSIS — J301 Allergic rhinitis due to pollen: Secondary | ICD-10-CM | POA: Diagnosis not present

## 2017-12-31 DIAGNOSIS — J3089 Other allergic rhinitis: Secondary | ICD-10-CM | POA: Diagnosis not present

## 2018-01-01 ENCOUNTER — Encounter: Payer: Self-pay | Admitting: Family Medicine

## 2018-01-01 DIAGNOSIS — Z723 Lack of physical exercise: Secondary | ICD-10-CM | POA: Diagnosis not present

## 2018-01-01 DIAGNOSIS — R0609 Other forms of dyspnea: Secondary | ICD-10-CM | POA: Diagnosis not present

## 2018-01-07 ENCOUNTER — Ambulatory Visit (INDEPENDENT_AMBULATORY_CARE_PROVIDER_SITE_OTHER): Payer: BLUE CROSS/BLUE SHIELD | Admitting: Vascular Surgery

## 2018-01-07 DIAGNOSIS — J301 Allergic rhinitis due to pollen: Secondary | ICD-10-CM | POA: Diagnosis not present

## 2018-01-07 DIAGNOSIS — H1045 Other chronic allergic conjunctivitis: Secondary | ICD-10-CM | POA: Diagnosis not present

## 2018-01-07 DIAGNOSIS — J3089 Other allergic rhinitis: Secondary | ICD-10-CM | POA: Diagnosis not present

## 2018-01-10 ENCOUNTER — Encounter (INDEPENDENT_AMBULATORY_CARE_PROVIDER_SITE_OTHER): Payer: Self-pay | Admitting: Nurse Practitioner

## 2018-01-10 ENCOUNTER — Ambulatory Visit (INDEPENDENT_AMBULATORY_CARE_PROVIDER_SITE_OTHER): Payer: BLUE CROSS/BLUE SHIELD | Admitting: Nurse Practitioner

## 2018-01-10 VITALS — BP 153/83 | HR 77 | Resp 18 | Ht 67.0 in | Wt 181.0 lb

## 2018-01-10 DIAGNOSIS — E118 Type 2 diabetes mellitus with unspecified complications: Secondary | ICD-10-CM

## 2018-01-10 DIAGNOSIS — Z794 Long term (current) use of insulin: Secondary | ICD-10-CM

## 2018-01-10 DIAGNOSIS — I10 Essential (primary) hypertension: Secondary | ICD-10-CM

## 2018-01-10 DIAGNOSIS — I739 Peripheral vascular disease, unspecified: Secondary | ICD-10-CM | POA: Diagnosis not present

## 2018-01-10 DIAGNOSIS — I890 Lymphedema, not elsewhere classified: Secondary | ICD-10-CM | POA: Diagnosis not present

## 2018-01-13 DIAGNOSIS — R011 Cardiac murmur, unspecified: Secondary | ICD-10-CM | POA: Diagnosis not present

## 2018-01-13 DIAGNOSIS — I890 Lymphedema, not elsewhere classified: Secondary | ICD-10-CM | POA: Diagnosis not present

## 2018-01-13 DIAGNOSIS — R0609 Other forms of dyspnea: Secondary | ICD-10-CM | POA: Diagnosis not present

## 2018-01-13 DIAGNOSIS — I10 Essential (primary) hypertension: Secondary | ICD-10-CM | POA: Diagnosis not present

## 2018-01-14 DIAGNOSIS — J301 Allergic rhinitis due to pollen: Secondary | ICD-10-CM | POA: Diagnosis not present

## 2018-01-14 DIAGNOSIS — J3089 Other allergic rhinitis: Secondary | ICD-10-CM | POA: Diagnosis not present

## 2018-01-15 DIAGNOSIS — E538 Deficiency of other specified B group vitamins: Secondary | ICD-10-CM | POA: Diagnosis not present

## 2018-01-15 DIAGNOSIS — E119 Type 2 diabetes mellitus without complications: Secondary | ICD-10-CM | POA: Diagnosis not present

## 2018-01-15 DIAGNOSIS — G4730 Sleep apnea, unspecified: Secondary | ICD-10-CM | POA: Diagnosis not present

## 2018-01-16 ENCOUNTER — Encounter (INDEPENDENT_AMBULATORY_CARE_PROVIDER_SITE_OTHER): Payer: Self-pay | Admitting: Nurse Practitioner

## 2018-01-16 NOTE — Progress Notes (Signed)
Subjective:    Patient ID: Cindy Hester, female    DOB: 1945-10-06, 72 y.o.   MRN: [id number] Chief Complaint  Patient presents with  . Follow-up    6 month Lymph check    HPI  Cindy Hester is a 72 y.o. female that is following up today for bilateral lower extremity edema.  Patient recently received a lymph pump which she says definitely helps with the swelling in her lower extremities, however her main issue in complaint is pain in her left lower extremity.  She states that this pain is consistent and it happens mainly when she is ambulating.    She denies current lower back pain however she does endorse having lower back pain many years ago when pregnant with her children.  The patient has no had any past angiography, interventions or vascular surgery.  She endorses a claudication-like pain that is worse when she ambulates and when she rest.The patient has been wearing graduated compression.  The patient denies a history of DVT or PE. There is no prior history of phlebitis.   No history of malignancies. No history of trauma or groin or pelvic surgery. There is no history of radiation treatment to the groin or pelvis  The patient denies amaurosis fugax or recent TIA symptoms. There are no recent neurological changes noted. The patient denies recent episodes of angina or shortness of breath  Previous ABIs did not reveal evidence of peripheral arterial disease.  She also had bilateral triphasic tibial waveforms. Past Medical History:  Diagnosis Date  . Arthritis    right shoulder blade, left thumb  . Diabetes mellitus without complication ([place])   . GERD (gastroesophageal reflux disease)   . Heart murmur    followed by PCP  . High cholesterol   . History of esophagitis   . History of gastritis   . HLD (hyperlipidemia)   . Hypertension   . Sleep apnea    CPAP    Past Surgical History:  Procedure Laterality Date  . BREAST CYST EXCISION Left    removed two times  .  CESAREAN SECTION  1986  . CYST EXCISION     from left breast  . ESOPHAGOGASTRODUODENOSCOPY (EGD) WITH PROPOFOL N/A 12/12/2015   gastritis, LA Grade A reflux esophagitis ESOPHAGOGASTRODUODENOSCOPY (EGD) WITH PROPOFOL;  Surgeon: Manya Silvas, MD;  Location: [place];  Service: Endoscopy;  Laterality: N/A;  Diabetic  . HAMMER TOE SURGERY Bilateral 05/30/2016   Procedure: HAMMER TOE CORRECTION RIGHT 2ND, 3RD, 4TH, 5TH TOES LEFT 5TH TOE;  Surgeon: Samara Deist, DPM;  Location: [place] [place];  Service: Podiatry;  Laterality: Bilateral;  . HEEL SPUR SURGERY Right 2011  . NASAL SINUS SURGERY    . TONSILLECTOMY      Social History   Socioeconomic History  . Marital status: Single    Spouse name: Not on file  . Number of children: 1  . Years of education: master's  . Highest education level: Not on file  Occupational History  . Occupation: Training and development officer: [employer]  Social Needs  . Financial resource strain: Not on file  . Food insecurity:    Worry: Not on file    Inability: Not on file  . Transportation needs:    Medical: Not on file    Non-medical: Not on file  Tobacco Use  . Smoking status: Never Smoker  . Smokeless tobacco: Never Used  Substance and Sexual Activity  .  Alcohol use: Yes    Comment: maybe 1 drink once a month  . Drug use: No  . Sexual activity: Not on file  Lifestyle  . Physical activity:    Days per week: Not on file    Minutes per session: Not on file  . Stress: Not on file  Relationships  . Social connections:    Talks on phone: Not on file    Gets together: Not on file    Attends religious service: Not on file    Active member of club or organization: Not on file    Attends meetings of clubs or organizations: Not on file    Relationship status: Not on file  . Intimate partner violence:    Fear of current or ex partner: Not on file    Emotionally abused: Not on file    Physically abused: Not on file     Forced sexual activity: Not on file  Other Topics Concern  . Not on file  Social History Narrative  . Not on file    Family History  Problem Relation Age of Onset  . Stroke Mother   . Diabetes Mother   . Hypertension Mother   . Congestive Heart Failure Father   . Hypertension Father   . CAD Father   . Glaucoma Father   . Hyperlipidemia Brother   . Breast cancer Paternal Aunt   . Breast cancer Paternal Aunt   . Diabetes Brother   . Heart disease Brother   . Stomach cancer Neg Hx   . Colon cancer Neg Hx     Allergies  Allergen Reactions  . Tape     Most tapes cause redness.  Paper tape and tegaderm are OK.     Review of Systems   Review of Systems: Negative Unless Checked Constitutional: [] Weight loss  [] Fever  [] Chills Cardiac: [] Chest pain   []  Atrial Fibrillation  [] Palpitations   [] Shortness of breath when laying flat   [] Shortness of breath with exertion. Vascular:  [] Pain in legs with walking   [] Pain in legs with standing  [] History of DVT   [] Phlebitis   [] Swelling in legs   [] Varicose veins   [] Non-healing ulcers Pulmonary:   [] Uses home oxygen   [] Productive cough   [] Hemoptysis   [] Wheeze  [] COPD   [] Asthma Neurologic:  [] Dizziness   [] Seizures   [] History of stroke   [] History of TIA  [] Aphasia   [] Vissual changes   [] Weakness or numbness in arm   [] Weakness or numbness in leg Musculoskeletal:   [] Joint swelling   [] Joint pain   [] Low back pain  []  History of Knee Replacement Hematologic:  [] Easy bruising  [] Easy bleeding   [] Hypercoagulable state   [] Anemic Gastrointestinal:  [] Diarrhea   [] Vomiting  [] Gastroesophageal reflux/heartburn   [] Difficulty swallowing. Genitourinary:  [] Chronic kidney disease   [] Difficult urination  [] Anuric   [] Blood in urine Skin:  [] Rashes   [] Ulcers  Psychological:  [] History of anxiety   []  History of major depression  []  Memory Difficulties     Objective:   Physical Exam  BP (!) 153/83 (BP Location: Right Arm)   Pulse 77    Resp 18   Ht 5\' 7"  (1.702 m)   Wt 181 lb (82.1 kg)   BMI 28.35 kg/m   Gen: WD/WN, NAD Head: [place]/AT, No temporalis wasting.  Ear/Nose/Throat: Hearing grossly intact, nares w/o erythema or drainage Eyes: PER, EOMI, sclera nonicteric.  Neck: Supple, no masses.  No JVD.  Pulmonary:  Good air movement, no use of accessory muscles.  Cardiac: RRR Vascular:  1+ soft edema bilateral lower extremity Vessel Right Left  Radial Palpable Palpable  Dorsalis Pedis Palpable Palpable  Posterior Tibial Palpable Palpable   Gastrointestinal: soft, non-distended. No guarding/no peritoneal signs.  Musculoskeletal: M/S 5/5 throughout.  No deformity or atrophy.  Neurologic: Pain and light touch intact in extremities.  Symmetrical.  Speech is fluent. Motor exam as listed above. Psychiatric: Judgment intact, Mood & affect appropriate for pt's clinical situation. Dermatologic: No Venous rashes. No Ulcers Noted.  No changes consistent with cellulitis. Lymph : No Cervical lymphadenopathy, no lichenification or skin changes of chronic lymphedema.      Assessment & Plan:   1. Lymphedema  No surgery or intervention at this point in time.    I have reviewed my discussion with the patient regarding lymphedema and why it  causes symptoms.  Patient will continue wearing graduated compression stockings class 1 (20-30 mmHg) on a daily basis a prescription was given. The patient is reminded to put the stockings on first thing in the morning and removing them in the evening. The patient is instructed specifically not to sleep in the stockings.   In addition, behavioral modification throughout the day will be continued.  This will include frequent elevation (such as in a recliner), use of over the counter pain medications as needed and exercise such as walking.  I have reviewed systemic causes for chronic edema such as liver, kidney and cardiac etiologies and there does not appear to be any significant changes in these  organ systems over the past year.  The patient is under the impression that these organ systems are all stable and unchanged.    The patient will continue aggressive use of the  lymph pump.  This will continue to improve the edema control and prevent sequela such as ulcers and infections.   The patient will follow-up with me on an annual basis.    2. Essential hypertension, benign Continue antihypertensive medications as already ordered, these medications have been reviewed and there are no changes at this time.   3. Type 2 diabetes mellitus with complication, with long-term current use of insulin (HCC) Continue hypoglycemic medications as already ordered, these medications have been reviewed and there are no changes at this time.  Hgb A1C to be monitored as already arranged by primary service   4. Claudication of left lower extremity ([place]) We will have the patient return at her convenience for bilateral ABI testing with toe lifts.  I explained to the patient that in sporadic instances, patients can sometimes have claudication-like symptoms that will generally only present after exercise.  If it is in fact peripheral arterial disease that is the cause of her pain we will see a precipitous drop in her ABIs following exercise.  I also suggested with the patient that her pain is neurogenic in nature.  However the patient feels that this is not likely due to the fact that she is not have any back pain at this time.  I also explained to the patient that neurogenic claudication can occur even in the absence of back pain.  Patient still wished to proceed with the exercise ABIs.  Patient will follow-up in the office with Dr. Lucky Cowboy following the noninvasive test. - VAS [place] ABI WITH/WO TBI; Future   Current Outpatient Medications on File Prior to Visit  Medication Sig Dispense Refill  . AMBULATORY NON FORMULARY MEDICATION Medication Name: FDgard: Please up  to 3 times a day as needed 12 capsule 0  .  amLODipine (NORVASC) 10 MG tablet take 1 tablet by mouth once daily 90 tablet 3  . aspirin 81 MG tablet Take 1 tablet (81 mg total) daily by mouth. 30 tablet   . BIOTIN 5000 PO Take by mouth daily.    . calcium carbonate (OS-CAL) 600 MG TABS tablet Take by mouth.    . cholecalciferol (VITAMIN D) 1000 UNITS tablet Take 5,000 Units by mouth once a week.     . clotrimazole-betamethasone (LOTRISONE) cream APP  BID TO TID   AA  0  . empagliflozin (JARDIANCE) 25 MG TABS tablet Take 25 mg by mouth daily.    Marland Kitchen EPINEPHrine 0.3 mg/0.3 mL IJ SOAJ injection INJ UTD  1  . fluticasone (FLONASE) 50 MCG/ACT nasal spray Place into both nostrils as needed for allergies or rhinitis.    . furosemide (LASIX) 40 MG tablet Take 20 mg by mouth as needed.     Marland Kitchen glucosamine-chondroitin (MAX GLUCOSAMINE CHONDROITIN) 500-400 MG tablet Take 1 tablet by mouth 3 (three) times daily. 30 tablet 0  . hydrochlorothiazide (HYDRODIURIL) 25 MG tablet Take 1 tablet (25 mg total) by mouth daily. 90 tablet 3  . Liraglutide (VICTOZA) 18 MG/3ML SOPN INJECT 1.8MG  UNDER THE SKIN ONCE DAILY AS DIRECTED    . losartan (COZAAR) 100 MG tablet Take 1 tablet (100 mg total) by mouth daily. 90 tablet 3  . meloxicam (MOBIC) 7.5 MG tablet Take 1 tablet by mouth daily.  0  . metFORMIN (GLUCOPHAGE) 1000 MG tablet Take 1,000 mg by mouth 2 (two) times daily with a meal.     . metoprolol (TOPROL-XL) 200 MG 24 hr tablet take 1 tablet by mouth once daily 30 tablet 12  . mometasone (NASONEX) 50 MCG/ACT nasal spray instill 1 to 2 sprays into each nostril once daily  0  . montelukast (SINGULAIR) 10 MG tablet TAKE 1 TABLET BY MOUTH DAILY. GENERIC EQUIVALENT FOR SINGULAIR. 90 tablet 3  . Multiple Vitamin (MULTIVITAMIN) tablet Take 1 tablet by mouth daily.      . Multiple Vitamins-Minerals (HAIR SKIN AND NAILS FORMULA PO) Take by mouth.    Marland Kitchen omeprazole (PRILOSEC) 20 MG capsule Take 20 mg by mouth daily.    . phentermine 30 MG capsule Take 30 mg by mouth every  morning.    . rosuvastatin (CRESTOR) 20 MG tablet TAKE 1 TABLET BY MOUTH AT BEDTIME 90 tablet 3  . spironolactone (ALDACTONE) 25 MG tablet TK 1 T PO QAM  1  . valACYclovir (VALTREX) 500 MG tablet take 1 tablet by mouth twice a day if needed 60 tablet 3  . vitamin B-12 (CYANOCOBALAMIN) 1000 MCG tablet Take 1,000 mcg by mouth daily.     No current facility-administered medications on file prior to visit.     There are no Patient Instructions on file for this visit. No follow-ups on file.   Kris Hartmann, NP  This note was completed with Sales executive.  Any errors are purely unintentional.

## 2018-01-21 DIAGNOSIS — J3089 Other allergic rhinitis: Secondary | ICD-10-CM | POA: Diagnosis not present

## 2018-01-21 DIAGNOSIS — J301 Allergic rhinitis due to pollen: Secondary | ICD-10-CM | POA: Diagnosis not present

## 2018-01-22 DIAGNOSIS — E119 Type 2 diabetes mellitus without complications: Secondary | ICD-10-CM | POA: Diagnosis not present

## 2018-01-22 DIAGNOSIS — E1159 Type 2 diabetes mellitus with other circulatory complications: Secondary | ICD-10-CM | POA: Diagnosis not present

## 2018-01-22 DIAGNOSIS — E1169 Type 2 diabetes mellitus with other specified complication: Secondary | ICD-10-CM | POA: Diagnosis not present

## 2018-01-22 DIAGNOSIS — I10 Essential (primary) hypertension: Secondary | ICD-10-CM | POA: Diagnosis not present

## 2018-02-04 DIAGNOSIS — J301 Allergic rhinitis due to pollen: Secondary | ICD-10-CM | POA: Diagnosis not present

## 2018-02-04 DIAGNOSIS — J3089 Other allergic rhinitis: Secondary | ICD-10-CM | POA: Diagnosis not present

## 2018-02-18 DIAGNOSIS — J3089 Other allergic rhinitis: Secondary | ICD-10-CM | POA: Diagnosis not present

## 2018-02-18 DIAGNOSIS — J301 Allergic rhinitis due to pollen: Secondary | ICD-10-CM | POA: Diagnosis not present

## 2018-02-20 DIAGNOSIS — I10 Essential (primary) hypertension: Secondary | ICD-10-CM | POA: Diagnosis not present

## 2018-02-21 ENCOUNTER — Encounter (INDEPENDENT_AMBULATORY_CARE_PROVIDER_SITE_OTHER): Payer: Self-pay | Admitting: Vascular Surgery

## 2018-02-21 ENCOUNTER — Ambulatory Visit (INDEPENDENT_AMBULATORY_CARE_PROVIDER_SITE_OTHER): Payer: BLUE CROSS/BLUE SHIELD | Admitting: Vascular Surgery

## 2018-02-21 ENCOUNTER — Ambulatory Visit (INDEPENDENT_AMBULATORY_CARE_PROVIDER_SITE_OTHER): Payer: BLUE CROSS/BLUE SHIELD

## 2018-02-21 VITALS — BP 131/77 | HR 68 | Resp 16 | Ht 67.0 in | Wt 179.0 lb

## 2018-02-21 DIAGNOSIS — I890 Lymphedema, not elsewhere classified: Secondary | ICD-10-CM | POA: Diagnosis not present

## 2018-02-21 DIAGNOSIS — I10 Essential (primary) hypertension: Secondary | ICD-10-CM | POA: Diagnosis not present

## 2018-02-21 DIAGNOSIS — I739 Peripheral vascular disease, unspecified: Secondary | ICD-10-CM

## 2018-02-21 DIAGNOSIS — E785 Hyperlipidemia, unspecified: Secondary | ICD-10-CM

## 2018-02-21 NOTE — Assessment & Plan Note (Signed)
lipid control important in reducing the progression of atherosclerotic disease. Continue statin therapy  

## 2018-02-21 NOTE — Assessment & Plan Note (Signed)
Doing better with lymphedema pump, compression and elevation.

## 2018-02-21 NOTE — Assessment & Plan Note (Signed)
Her ABIs today were normal with normal triphasic waveforms at rest and with exercise.  With this finding, it is extremely unlikely that she has significant arterial insufficiency that is causing her lower extremity symptoms.  This is most likely neurogenic claudication.  At this point, no further invasive work-up is likely to be of benefit although angiogram could be performed if she is desirous to have further evaluation.  She is not.  I will see her back as needed.

## 2018-02-21 NOTE — Progress Notes (Signed)
MRN : [id number]  Cindy Hester is a 73 y.o. (15-Oct-1945) female who presents with chief complaint of  Chief Complaint  Patient presents with  . Follow-up  .  History of Present Illness: Patient returns today in follow up of left leg heaviness and tiredness with activity.  She has previously had normal resting ABIs and returns today with exercise induced ABIs.  Her symptoms are unchanged from that regard.  She is being treated for swelling and lymphedema and her swelling is better with compression, elevation, and the lymphedema pump. Her ABIs today were normal with normal triphasic waveforms at rest and with exercise.   Current Outpatient Medications  Medication Sig Dispense Refill  . amLODipine (NORVASC) 10 MG tablet take 1 tablet by mouth once daily 90 tablet 3  . aspirin 81 MG tablet Take 1 tablet (81 mg total) daily by mouth. 30 tablet   . BIOTIN 5000 PO Take by mouth daily.    . calcium carbonate (OS-CAL) 600 MG TABS tablet Take by mouth.    . cholecalciferol (VITAMIN D) 1000 UNITS tablet Take 5,000 Units by mouth once a week.     . empagliflozin (JARDIANCE) 25 MG TABS tablet Take 25 mg by mouth daily.    Marland Kitchen EPINEPHrine 0.3 mg/0.3 mL IJ SOAJ injection INJ UTD  1  . fluticasone (FLONASE) 50 MCG/ACT nasal spray Place into both nostrils as needed for allergies or rhinitis.    . furosemide (LASIX) 40 MG tablet Take 20 mg by mouth as needed.     Marland Kitchen glucosamine-chondroitin (MAX GLUCOSAMINE CHONDROITIN) 500-400 MG tablet Take 1 tablet by mouth 3 (three) times daily. 30 tablet 0  . Liraglutide (VICTOZA) 18 MG/3ML SOPN INJECT 1.8MG  UNDER THE SKIN ONCE DAILY AS DIRECTED    . meloxicam (MOBIC) 7.5 MG tablet Take 1 tablet by mouth daily.  0  . metFORMIN (GLUCOPHAGE) 1000 MG tablet Take 1,000 mg by mouth 2 (two) times daily with a meal.     . metoprolol (TOPROL-XL) 200 MG 24 hr tablet take 1 tablet by mouth once daily 30 tablet 12  . mometasone (NASONEX) 50 MCG/ACT nasal spray as needed.   0  .  montelukast (SINGULAIR) 10 MG tablet TAKE 1 TABLET BY MOUTH DAILY. GENERIC EQUIVALENT FOR SINGULAIR. 90 tablet 3  . Multiple Vitamin (MULTIVITAMIN) tablet Take 1 tablet by mouth daily.      . Multiple Vitamins-Minerals (HAIR SKIN AND NAILS FORMULA PO) Take by mouth.    Marland Kitchen omeprazole (PRILOSEC) 20 MG capsule Take 20 mg by mouth daily.    . phentermine 30 MG capsule Take 30 mg by mouth every morning.    . rosuvastatin (CRESTOR) 20 MG tablet TAKE 1 TABLET BY MOUTH AT BEDTIME 90 tablet 3  . spironolactone (ALDACTONE) 25 MG tablet TK 1 T PO QAM  1  . valACYclovir (VALTREX) 500 MG tablet take 1 tablet by mouth twice a day if needed 60 tablet 3  . valsartan-hydrochlorothiazide (DIOVAN-HCT) 160-25 MG tablet TK 1 T PO QAM. DISCONTINUE LOSARAN AND HCTZ    . vitamin B-12 (CYANOCOBALAMIN) 1000 MCG tablet Take 1,000 mcg by mouth daily.    . AMBULATORY NON FORMULARY MEDICATION Medication Name: FDgard: Please up to 3 times a day as needed (Patient not taking: Reported on 02/21/2018) 12 capsule 0  . clotrimazole-betamethasone (LOTRISONE) cream APP  BID TO TID   AA  0   No current facility-administered medications for this visit.     Past Medical History:  Diagnosis  Date  . Arthritis    right shoulder blade, left thumb  . Diabetes mellitus without complication ([place])   . GERD (gastroesophageal reflux disease)   . Heart murmur    followed by PCP  . High cholesterol   . History of esophagitis   . History of gastritis   . HLD (hyperlipidemia)   . Hypertension   . Sleep apnea    CPAP    Past Surgical History:  Procedure Laterality Date  . BREAST CYST EXCISION Left    removed two times  . CESAREAN SECTION  1986  . CYST EXCISION     from left breast  . ESOPHAGOGASTRODUODENOSCOPY (EGD) WITH PROPOFOL N/A 12/12/2015   gastritis, LA Grade A reflux esophagitis ESOPHAGOGASTRODUODENOSCOPY (EGD) WITH PROPOFOL;  Surgeon: Manya Silvas, MD;  Location: [place];  Service: Endoscopy;  Laterality: N/A;   Diabetic  . HAMMER TOE SURGERY Bilateral 05/30/2016   Procedure: HAMMER TOE CORRECTION RIGHT 2ND, 3RD, 4TH, 5TH TOES LEFT 5TH TOE;  Surgeon: Samara Deist, DPM;  Location: [facility];  Service: Podiatry;  Laterality: Bilateral;  . HEEL SPUR SURGERY Right 2011  . NASAL SINUS SURGERY    . TONSILLECTOMY      Social History Social History   Tobacco Use  . Smoking status: Never Smoker  . Smokeless tobacco: Never Used  Substance Use Topics  . Alcohol use: Yes    Comment: maybe 1 drink once a month  . Drug use: No    Family History Family History  Problem Relation Age of Onset  . Stroke Mother   . Diabetes Mother   . Hypertension Mother   . Congestive Heart Failure Father   . Hypertension Father   . CAD Father   . Glaucoma Father   . Hyperlipidemia Brother   . Breast cancer Paternal Aunt   . Breast cancer Paternal Aunt   . Diabetes Brother   . Heart disease Brother   . Stomach cancer Neg Hx   . Colon cancer Neg Hx     Allergies  Allergen Reactions  . Tape     Most tapes cause redness.  Paper tape and tegaderm are OK.     REVIEW OF SYSTEMS (Negative unless checked)  Constitutional: [] Weight loss  [] Fever  [] Chills Cardiac: [] Chest pain   [] Chest pressure   [] Palpitations   [] Shortness of breath when laying flat   [] Shortness of breath at rest   [] Shortness of breath with exertion. Vascular:  [x] Pain in legs with walking   [] Pain in legs at rest   [] Pain in legs when laying flat   [x] Claudication   [] Pain in feet when walking  [] Pain in feet at rest  [] Pain in feet when laying flat   [] History of DVT   [] Phlebitis   [x] Swelling in legs   [] Varicose veins   [] Non-healing ulcers Pulmonary:   [] Uses home oxygen   [] Productive cough   [] Hemoptysis   [] Wheeze  [] COPD   [] Asthma Neurologic:  [] Dizziness  [] Blackouts   [] Seizures   [] History of stroke   [] History of TIA  [] Aphasia   [] Temporary blindness   [] Dysphagia   [] Weakness or numbness in arms   [] Weakness or  numbness in legs Musculoskeletal:  [] Arthritis   [] Joint swelling   [] Joint pain   [] Low back pain Hematologic:  [] Easy bruising  [] Easy bleeding   [] Hypercoagulable state   [] Anemic   Gastrointestinal:  [] Blood in stool   [] Vomiting blood  [] Gastroesophageal reflux/heartburn   [] Abdominal pain Genitourinary:  []   Chronic kidney disease   [] Difficult urination  [] Frequent urination  [] Burning with urination   [] Hematuria Skin:  [] Rashes   [] Ulcers   [] Wounds Psychological:  [] History of anxiety   []  History of major depression.  Physical Examination  BP 131/77 (BP Location: Right Arm, Patient Position: Sitting)   Pulse 68   Resp 16   Ht 5\' 7"  (1.702 m)   Wt 179 lb (81.2 kg)   BMI 28.04 kg/m  Gen:  WD/WN, NAD Head: [place]/AT, No temporalis wasting. Ear/Nose/Throat: Hearing grossly intact, nares w/o erythema or drainage Eyes: Conjunctiva clear. Sclera non-icteric Neck: Supple.  Trachea midline Pulmonary:  Good air movement, no use of accessory muscles.  Cardiac: RRR, no JVD Vascular:  Vessel Right Left                           Musculoskeletal: M/S 5/5 throughout.  No deformity or atrophy. 1+ BLE edema. Neurologic: Sensation grossly intact in extremities.  Symmetrical.  Speech is fluent.  Psychiatric: Judgment intact, Mood & affect appropriate for pt's clinical situation. Dermatologic: No rashes or ulcers noted.  No cellulitis or open wounds.       Labs No results found for this or any previous visit (from the past 2160 hour(s)).  Radiology No results found.  Assessment/Plan  No problem-specific Assessment & Plan notes found for this encounter.    Leotis Pain, MD  02/21/2018 9:34 AM    This note was created with Dragon medical transcription system.  Any errors from dictation are purely unintentional

## 2018-02-21 NOTE — Assessment & Plan Note (Signed)
blood pressure control important in reducing the progression of atherosclerotic disease. On appropriate oral medications.  

## 2018-02-25 DIAGNOSIS — J3089 Other allergic rhinitis: Secondary | ICD-10-CM | POA: Diagnosis not present

## 2018-02-28 DIAGNOSIS — R0609 Other forms of dyspnea: Secondary | ICD-10-CM | POA: Diagnosis not present

## 2018-02-28 DIAGNOSIS — I10 Essential (primary) hypertension: Secondary | ICD-10-CM | POA: Diagnosis not present

## 2018-02-28 DIAGNOSIS — R0989 Other specified symptoms and signs involving the circulatory and respiratory systems: Secondary | ICD-10-CM | POA: Diagnosis not present

## 2018-02-28 DIAGNOSIS — R011 Cardiac murmur, unspecified: Secondary | ICD-10-CM | POA: Diagnosis not present

## 2018-03-04 DIAGNOSIS — J3089 Other allergic rhinitis: Secondary | ICD-10-CM | POA: Diagnosis not present

## 2018-03-04 DIAGNOSIS — J301 Allergic rhinitis due to pollen: Secondary | ICD-10-CM | POA: Diagnosis not present

## 2018-03-13 DIAGNOSIS — J3089 Other allergic rhinitis: Secondary | ICD-10-CM | POA: Diagnosis not present

## 2018-03-13 DIAGNOSIS — J301 Allergic rhinitis due to pollen: Secondary | ICD-10-CM | POA: Diagnosis not present

## 2018-03-25 ENCOUNTER — Other Ambulatory Visit: Payer: Self-pay | Admitting: Family Medicine

## 2018-03-25 ENCOUNTER — Encounter: Payer: Self-pay | Admitting: Gastroenterology

## 2018-03-25 DIAGNOSIS — J301 Allergic rhinitis due to pollen: Secondary | ICD-10-CM | POA: Diagnosis not present

## 2018-03-25 DIAGNOSIS — J3089 Other allergic rhinitis: Secondary | ICD-10-CM | POA: Diagnosis not present

## 2018-03-25 MED ORDER — AMLODIPINE BESYLATE 10 MG PO TABS: 10.0000 mg | ORAL_TABLET | Freq: Every day | ORAL | 0 refills | Status: DC

## 2018-03-25 NOTE — Telephone Encounter (Signed)
Patient is out of Amlodipine 10 mg. And needs refills sent to [facility]

## 2018-03-30 ENCOUNTER — Other Ambulatory Visit: Payer: Self-pay | Admitting: Family Medicine

## 2018-03-30 ENCOUNTER — Other Ambulatory Visit: Payer: Self-pay | Admitting: Cardiology

## 2018-04-01 ENCOUNTER — Other Ambulatory Visit: Payer: Self-pay | Admitting: Family Medicine

## 2018-04-01 DIAGNOSIS — J301 Allergic rhinitis due to pollen: Secondary | ICD-10-CM | POA: Diagnosis not present

## 2018-04-01 DIAGNOSIS — J3089 Other allergic rhinitis: Secondary | ICD-10-CM | POA: Diagnosis not present

## 2018-04-01 NOTE — Telephone Encounter (Signed)
Pharmacy is telling pt she is needing a new Rx valACYclovir (VALTREX) 500 MG tablet  Please fill at: [facility], [place] [phone] (Phone) [phone] (Fax)    Thanks, [employer]

## 2018-04-02 MED ORDER — VALACYCLOVIR HCL 500 MG PO TABS: ORAL_TABLET | ORAL | 3 refills | Status: DC

## 2018-04-10 DIAGNOSIS — J3089 Other allergic rhinitis: Secondary | ICD-10-CM | POA: Diagnosis not present

## 2018-04-10 DIAGNOSIS — J301 Allergic rhinitis due to pollen: Secondary | ICD-10-CM | POA: Diagnosis not present

## 2018-04-29 ENCOUNTER — Other Ambulatory Visit: Payer: Self-pay

## 2018-04-29 ENCOUNTER — Telehealth (INDEPENDENT_AMBULATORY_CARE_PROVIDER_SITE_OTHER): Payer: BLUE CROSS/BLUE SHIELD | Admitting: Gastroenterology

## 2018-04-29 ENCOUNTER — Other Ambulatory Visit: Payer: Self-pay | Admitting: Cardiology

## 2018-04-29 DIAGNOSIS — R142 Eructation: Secondary | ICD-10-CM

## 2018-04-29 DIAGNOSIS — R112 Nausea with vomiting, unspecified: Secondary | ICD-10-CM | POA: Diagnosis not present

## 2018-04-29 DIAGNOSIS — J3089 Other allergic rhinitis: Secondary | ICD-10-CM | POA: Diagnosis not present

## 2018-04-29 DIAGNOSIS — R194 Change in bowel habit: Secondary | ICD-10-CM | POA: Diagnosis not present

## 2018-04-29 DIAGNOSIS — R6881 Early satiety: Secondary | ICD-10-CM

## 2018-04-29 DIAGNOSIS — J301 Allergic rhinitis due to pollen: Secondary | ICD-10-CM | POA: Diagnosis not present

## 2018-04-29 MED ORDER — ONDANSETRON 4 MG PO TBDP: 4.0000 mg | ORAL_TABLET | Freq: Three times a day (TID) | ORAL | 1 refills | Status: DC | PRN

## 2018-04-29 MED ORDER — METOCLOPRAMIDE HCL 10 MG PO TABS: 10.0000 mg | ORAL_TABLET | Freq: Three times a day (TID) | ORAL | 1 refills | Status: DC

## 2018-04-29 NOTE — Progress Notes (Signed)
Virtual Visit via Video Note  I connected with Cindy Hester on 04/29/18 at  3:30 PM EDT by an enabled telemedicine application and verified that I am speaking with the correct person using two identifiers.   I discussed the limitations of evaluation and management by telemedicine and the availability of in person appointments. The patient expressed understanding and agreed to proceed.  THIS ENCOUNTER IS A VIRTUAL VISIT DUE TO COVID-19 - PATIENT WAS NOT SEEN IN THE OFFICE. PATIENT HAS CONSENTED TO VIRTUAL VISIT / TELEMEDICINE VISIT   Location of patient: home Location of provider: office  Time spent with patient:  25 minutes    HPI :  73 y/o female here for a follow up visit done via virtual visit. She has had multiple upper tract symptoms, previously evaluated [facility] and then saw me for a second opinion in 2018. Main issue has been belching, nausea, occasional vomiting, early satiety. She had a previous EGD in 2017 and then a SBFT in 2018 as below. I performed a repeat EGD in 12/2016 which did not show any concerning pathology and also she had a normal GES. I gave her a trial of baclofen. She thinks she did well for a while but she can't remember if she actually took the baclofen, she has a hard time remembering. Most of 2019 she did well and then for the past 3 months has had worsening recurrent symptoms.   She continues to have a lot of belching that bothers her. She has tried losing some weight, placed on phentermine, which helped somewhat, reduced appetite. She stopped it in December but gastric symptoms worsened. She has poor appetite, nauseated, eats 3-4 bites and then feels nauseated. She felt strange feelings "around her mouth" with some tooth pain, which has since has fluctuated. Lost about 5-6 lbs over 2 week period due to symptoms. Had some vomiting recently, felt as though she vomited what appears to be undigested food and things she had eaten the day previous. She  has not had pyrosis, but mostly belching. Cannot pinpoint any particular foods which cause the belching. She is taking omeprazole 20mg  once daily. Mostly controls reflux but does have some breakthrough periodically. She has since stopped Victoza, now on Ozempic for the past 3 months which correlates to worsening symptoms. She has alternating constipation / diarrhea which she has had for years, no blood in the stools.   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  GES 12/2016 - normal  Past Medical History:  Diagnosis Date  . Arthritis    right shoulder blade, left thumb  . Diabetes mellitus without complication ([place])   . GERD (gastroesophageal reflux disease)   . Heart murmur    followed by PCP  . High cholesterol   . History of esophagitis   . History of gastritis   . HLD (hyperlipidemia)   . Hypertension   . Sleep apnea    CPAP     Past Surgical History:  Procedure Laterality Date  . BREAST CYST EXCISION Left    removed two times  . CESAREAN SECTION  1986  . CYST EXCISION     from left breast  .  ESOPHAGOGASTRODUODENOSCOPY (EGD) WITH PROPOFOL N/A 12/12/2015   gastritis, LA Grade A reflux esophagitis ESOPHAGOGASTRODUODENOSCOPY (EGD) WITH PROPOFOL;  Surgeon: Manya Silvas, MD;  Location: [place];  Service: Endoscopy;  Laterality: N/A;  Diabetic  . HAMMER TOE SURGERY Bilateral 05/30/2016   Procedure: HAMMER TOE CORRECTION RIGHT 2ND, 3RD, 4TH, 5TH TOES LEFT 5TH TOE;  Surgeon: Samara Deist, DPM;  Location: [place];  Service: Podiatry;  Laterality: Bilateral;  . HEEL SPUR SURGERY Right 2011  . NASAL SINUS  SURGERY    . TONSILLECTOMY     Family History  Problem Relation Age of Onset  . Stroke Mother   . Diabetes Mother   . Hypertension Mother   . Congestive Heart Failure Father   . Hypertension Father   . CAD Father   . Glaucoma Father   . Hyperlipidemia Brother   . Breast cancer Paternal Aunt   . Breast cancer Paternal Aunt   . Diabetes Brother   . Heart disease Brother   . Stomach cancer Neg Hx   . Colon cancer Neg Hx    Social History   Tobacco Use  . Smoking status: Never Smoker  . Smokeless tobacco: Never Used  Substance Use Topics  . Alcohol use: Yes    Comment: maybe 1 drink once a month  . Drug use: No   Current Outpatient Medications  Medication Sig Dispense Refill  . AMBULATORY NON FORMULARY MEDICATION Medication Name: FDgard: Please up to 3 times a day as needed (Patient not taking: Reported on 02/21/2018) 12 capsule 0  . amLODipine (NORVASC) 10 MG tablet Take 1 tablet (10 mg total) by mouth daily. 90 tablet 0  . aspirin 81 MG tablet Take 1 tablet (81 mg total) daily by mouth. 30 tablet   . BIOTIN 5000 PO Take by mouth daily.    . calcium carbonate (OS-CAL) 600 MG TABS tablet Take by mouth.    . cholecalciferol (VITAMIN D) 1000 UNITS tablet Take 5,000 Units by mouth once a week.     . clotrimazole-betamethasone (LOTRISONE) cream APP  BID TO TID   AA  0  . empagliflozin (JARDIANCE) 25 MG TABS tablet Take 25 mg by mouth daily.    Marland Kitchen EPINEPHrine 0.3 mg/0.3 mL IJ SOAJ injection INJ UTD  1  . fluticasone (FLONASE) 50 MCG/ACT nasal spray Place into both nostrils as needed for allergies or rhinitis.    . furosemide (LASIX) 40 MG tablet Take 20 mg by mouth as needed.     Marland Kitchen glucosamine-chondroitin (MAX GLUCOSAMINE CHONDROITIN) 500-400 MG tablet Take 1 tablet by mouth 3 (three) times daily. 30 tablet 0  . Liraglutide (VICTOZA) 18 MG/3ML SOPN INJECT 1.8MG  UNDER THE SKIN ONCE DAILY AS DIRECTED    . meloxicam (MOBIC) 7.5 MG tablet Take 1 tablet by mouth daily.  0  . metFORMIN  (GLUCOPHAGE) 1000 MG tablet Take 1,000 mg by mouth 2 (two) times daily with a meal.     . metoprolol (TOPROL-XL) 200 MG 24 hr tablet take 1 tablet by mouth once daily 30 tablet 12  . mometasone (NASONEX) 50 MCG/ACT nasal spray as needed.   0  . montelukast (SINGULAIR) 10 MG tablet TAKE 1 TABLET BY MOUTH DAILY. GENERIC EQUIVALENT FOR SINGULAIR. 90 tablet 3  . Multiple Vitamin (MULTIVITAMIN) tablet Take 1 tablet by mouth daily.      . Multiple Vitamins-Minerals (HAIR SKIN AND NAILS FORMULA PO) Take by mouth.    Marland Kitchen omeprazole (PRILOSEC) 20 MG capsule Take 20 mg by mouth  daily.    . phentermine 30 MG capsule Take 30 mg by mouth every morning.    . rosuvastatin (CRESTOR) 20 MG tablet TAKE 1 TABLET BY MOUTH AT BEDTIME 90 tablet 3  . spironolactone (ALDACTONE) 25 MG tablet TK 1 T PO QAM  1  . valACYclovir (VALTREX) 500 MG tablet take 1 tablet by mouth twice a day if needed 60 tablet 3  . valsartan-hydrochlorothiazide (DIOVAN-HCT) 160-25 MG tablet TAKE 1 TABLET BY MOUTH EVERY MORNING. DISCONTINUE LOSARTAN/HCTZ 30 tablet 0  . vitamin B-12 (CYANOCOBALAMIN) 1000 MCG tablet Take 1,000 mcg by mouth daily.     No current facility-administered medications for this visit.    Allergies  Allergen Reactions  . Tape     Most tapes cause redness.  Paper tape and tegaderm are OK.     Review of Systems: All systems reviewed and negative except where noted in HPI.   Lab Results  Component Value Date   WBC 7.7 11/04/2017   HGB 14.7 11/04/2017   HCT 43.9 11/04/2017   MCV 85 11/04/2017   PLT 315 11/04/2017    Lab Results  Component Value Date   CREATININE 0.73 11/04/2017   BUN 19 11/04/2017   NA 139 11/04/2017   K 4.7 11/04/2017   CL 97 11/04/2017   CO2 27 11/04/2017    Lab Results  Component Value Date   ALT 14 11/04/2017   AST 13 11/04/2017   ALKPHOS 72 11/04/2017   BILITOT 0.4 11/04/2017     Physical Exam: There were no vitals taken for this visit.  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 She probably has supragastric belching which we discussed, she can't recall her response to baclofen and if it helped. Otherwise having worsening upper tract symptoms since starting Ozempic which could be related. Discussed options. While her gastric emptying study was normal, I will give her a trial of Reglan 10mg  with meals to see if it helps given her recent vomiting, few week trial. I will also give her some Zofran 4mg  to take every 8 hours around the clock, and will increase omeprazole to BID dosing. She can use some gas-ex to see if that will reduce the air / belching. If no benefit with the Reglan, may give her a trial of buspirone or TCA for functional dyspepsia. If symptoms persist she may have to back off the Ozempic, as I suspect this has flared her symptoms. She agreed.   Altered bowel habits - recommend daily fiber supplement such as Citrucel to use once daily, rather than metamucil which can make bloating worse. If no improvement she can call back.   Follow up as needed.  Cressey Cellar, MD [facility], [facility]

## 2018-04-29 NOTE — Patient Instructions (Signed)
!!!     Phone Visit   !!!  If you are age 71 or older, your body mass index should be between 23-30. Your There is no height or weight on file to calculate BMI. If this is out of the aforementioned range listed, please consider follow up with your Primary Care Provider.  If you are age 62 or younger, your body mass index should be between 19-25. Your There is no height or weight on file to calculate BMI. If this is out of the aformentioned range listed, please consider follow up with your Primary Care Provider.   To help prevent the possible spread of infection to our patients, communities, and staff; we will be implementing the following measures:  Please only allow one visitor/family member to accompany you to any upcoming appointments with [facility]. If you have any concerns about this please contact our office to discuss prior to the appointment.    We have sent the following medications to your pharmacy for you to pick up at your convenience: Zofran 4mg  ODT: Take every 8 hours as needed Reglan 10mg : Take three times a day with meals  Thank you for entrusting me with your care and for choosing [facility], Dr. [place] Cellar

## 2018-05-02 ENCOUNTER — Other Ambulatory Visit: Payer: Self-pay | Admitting: Obstetrics and Gynecology

## 2018-05-02 DIAGNOSIS — Z1231 Encounter for screening mammogram for malignant neoplasm of breast: Secondary | ICD-10-CM

## 2018-05-05 ENCOUNTER — Ambulatory Visit: Payer: Self-pay | Admitting: Family Medicine

## 2018-05-06 DIAGNOSIS — J301 Allergic rhinitis due to pollen: Secondary | ICD-10-CM | POA: Diagnosis not present

## 2018-05-06 DIAGNOSIS — J3089 Other allergic rhinitis: Secondary | ICD-10-CM | POA: Diagnosis not present

## 2018-05-13 ENCOUNTER — Encounter: Payer: BLUE CROSS/BLUE SHIELD | Admitting: Family Medicine

## 2018-05-15 DIAGNOSIS — J3089 Other allergic rhinitis: Secondary | ICD-10-CM | POA: Diagnosis not present

## 2018-05-15 DIAGNOSIS — J301 Allergic rhinitis due to pollen: Secondary | ICD-10-CM | POA: Diagnosis not present

## 2018-05-20 DIAGNOSIS — J3089 Other allergic rhinitis: Secondary | ICD-10-CM | POA: Diagnosis not present

## 2018-05-20 DIAGNOSIS — J301 Allergic rhinitis due to pollen: Secondary | ICD-10-CM | POA: Diagnosis not present

## 2018-05-22 DIAGNOSIS — G4730 Sleep apnea, unspecified: Secondary | ICD-10-CM | POA: Diagnosis not present

## 2018-05-26 DIAGNOSIS — J301 Allergic rhinitis due to pollen: Secondary | ICD-10-CM | POA: Diagnosis not present

## 2018-05-26 DIAGNOSIS — J3089 Other allergic rhinitis: Secondary | ICD-10-CM | POA: Diagnosis not present

## 2018-05-27 DIAGNOSIS — J301 Allergic rhinitis due to pollen: Secondary | ICD-10-CM | POA: Diagnosis not present

## 2018-05-27 DIAGNOSIS — J3089 Other allergic rhinitis: Secondary | ICD-10-CM | POA: Diagnosis not present

## 2018-05-28 DIAGNOSIS — E1169 Type 2 diabetes mellitus with other specified complication: Secondary | ICD-10-CM | POA: Diagnosis not present

## 2018-05-28 DIAGNOSIS — E119 Type 2 diabetes mellitus without complications: Secondary | ICD-10-CM | POA: Diagnosis not present

## 2018-05-28 DIAGNOSIS — E1159 Type 2 diabetes mellitus with other circulatory complications: Secondary | ICD-10-CM | POA: Diagnosis not present

## 2018-05-28 DIAGNOSIS — E538 Deficiency of other specified B group vitamins: Secondary | ICD-10-CM | POA: Diagnosis not present

## 2018-06-03 DIAGNOSIS — J3089 Other allergic rhinitis: Secondary | ICD-10-CM | POA: Diagnosis not present

## 2018-06-03 DIAGNOSIS — J301 Allergic rhinitis due to pollen: Secondary | ICD-10-CM | POA: Diagnosis not present

## 2018-06-03 DIAGNOSIS — G4730 Sleep apnea, unspecified: Secondary | ICD-10-CM | POA: Diagnosis not present

## 2018-06-05 ENCOUNTER — Other Ambulatory Visit: Payer: Self-pay | Admitting: Cardiology

## 2018-06-05 DIAGNOSIS — R0989 Other specified symptoms and signs involving the circulatory and respiratory systems: Secondary | ICD-10-CM

## 2018-06-09 ENCOUNTER — Other Ambulatory Visit: Payer: Self-pay | Admitting: Cardiology

## 2018-06-09 NOTE — Telephone Encounter (Signed)
Patient has appointment on 09/26/18

## 2018-06-12 ENCOUNTER — Other Ambulatory Visit: Payer: Self-pay | Admitting: Family Medicine

## 2018-06-17 DIAGNOSIS — J301 Allergic rhinitis due to pollen: Secondary | ICD-10-CM | POA: Diagnosis not present

## 2018-06-17 DIAGNOSIS — J3089 Other allergic rhinitis: Secondary | ICD-10-CM | POA: Diagnosis not present

## 2018-06-23 ENCOUNTER — Other Ambulatory Visit: Payer: Self-pay

## 2018-06-23 ENCOUNTER — Ambulatory Visit
Admission: RE | Admit: 2018-06-23 | Discharge: 2018-06-23 | Disposition: A | Discharge status: Home / Self Care | Payer: BLUE CROSS/BLUE SHIELD | Source: Ambulatory Visit | Attending: Obstetrics and Gynecology | Admitting: Obstetrics and Gynecology

## 2018-06-23 DIAGNOSIS — Z1231 Encounter for screening mammogram for malignant neoplasm of breast: Secondary | ICD-10-CM | POA: Insufficient documentation

## 2018-06-24 DIAGNOSIS — J301 Allergic rhinitis due to pollen: Secondary | ICD-10-CM | POA: Diagnosis not present

## 2018-06-24 DIAGNOSIS — J3089 Other allergic rhinitis: Secondary | ICD-10-CM | POA: Diagnosis not present

## 2018-07-01 DIAGNOSIS — J301 Allergic rhinitis due to pollen: Secondary | ICD-10-CM | POA: Diagnosis not present

## 2018-07-01 DIAGNOSIS — J3089 Other allergic rhinitis: Secondary | ICD-10-CM | POA: Diagnosis not present

## 2018-07-03 DIAGNOSIS — J301 Allergic rhinitis due to pollen: Secondary | ICD-10-CM | POA: Diagnosis not present

## 2018-07-03 DIAGNOSIS — J3089 Other allergic rhinitis: Secondary | ICD-10-CM | POA: Diagnosis not present

## 2018-07-04 ENCOUNTER — Other Ambulatory Visit: Payer: Self-pay

## 2018-07-04 MED ORDER — METOPROLOL SUCCINATE ER 200 MG PO TB24: 200.0000 mg | ORAL_TABLET | Freq: Every day | ORAL | 3 refills | Status: DC

## 2018-07-08 DIAGNOSIS — J301 Allergic rhinitis due to pollen: Secondary | ICD-10-CM | POA: Diagnosis not present

## 2018-07-08 DIAGNOSIS — J3089 Other allergic rhinitis: Secondary | ICD-10-CM | POA: Diagnosis not present

## 2018-07-10 DIAGNOSIS — J301 Allergic rhinitis due to pollen: Secondary | ICD-10-CM | POA: Diagnosis not present

## 2018-07-10 DIAGNOSIS — J3089 Other allergic rhinitis: Secondary | ICD-10-CM | POA: Diagnosis not present

## 2018-07-15 DIAGNOSIS — J301 Allergic rhinitis due to pollen: Secondary | ICD-10-CM | POA: Diagnosis not present

## 2018-07-15 DIAGNOSIS — J3089 Other allergic rhinitis: Secondary | ICD-10-CM | POA: Diagnosis not present

## 2018-07-24 DIAGNOSIS — J3089 Other allergic rhinitis: Secondary | ICD-10-CM | POA: Diagnosis not present

## 2018-07-24 DIAGNOSIS — J301 Allergic rhinitis due to pollen: Secondary | ICD-10-CM | POA: Diagnosis not present

## 2018-07-29 DIAGNOSIS — J301 Allergic rhinitis due to pollen: Secondary | ICD-10-CM | POA: Diagnosis not present

## 2018-07-29 DIAGNOSIS — J3089 Other allergic rhinitis: Secondary | ICD-10-CM | POA: Diagnosis not present

## 2018-07-29 DIAGNOSIS — G4730 Sleep apnea, unspecified: Secondary | ICD-10-CM | POA: Diagnosis not present

## 2018-08-05 DIAGNOSIS — J3089 Other allergic rhinitis: Secondary | ICD-10-CM | POA: Diagnosis not present

## 2018-08-12 DIAGNOSIS — J3089 Other allergic rhinitis: Secondary | ICD-10-CM | POA: Diagnosis not present

## 2018-08-12 DIAGNOSIS — J301 Allergic rhinitis due to pollen: Secondary | ICD-10-CM | POA: Diagnosis not present

## 2018-08-19 DIAGNOSIS — J301 Allergic rhinitis due to pollen: Secondary | ICD-10-CM | POA: Diagnosis not present

## 2018-08-19 DIAGNOSIS — J3089 Other allergic rhinitis: Secondary | ICD-10-CM | POA: Diagnosis not present

## 2018-09-01 ENCOUNTER — Other Ambulatory Visit: Payer: Self-pay | Admitting: Family Medicine

## 2018-09-01 DIAGNOSIS — E785 Hyperlipidemia, unspecified: Secondary | ICD-10-CM

## 2018-09-01 MED ORDER — ROSUVASTATIN CALCIUM 20 MG PO TABS: 20.0000 mg | ORAL_TABLET | Freq: Every day | ORAL | 3 refills | Status: DC

## 2018-09-01 NOTE — Telephone Encounter (Signed)
[facility] faxed refill request for the following medications:  rosuvastatin (CRESTOR) 20 MG tablet   Please advise. Thanks TNP

## 2018-09-02 DIAGNOSIS — J301 Allergic rhinitis due to pollen: Secondary | ICD-10-CM | POA: Diagnosis not present

## 2018-09-02 DIAGNOSIS — J3089 Other allergic rhinitis: Secondary | ICD-10-CM | POA: Diagnosis not present

## 2018-09-09 ENCOUNTER — Encounter: Payer: Self-pay | Admitting: Family Medicine

## 2018-09-09 ENCOUNTER — Ambulatory Visit: Payer: BLUE CROSS/BLUE SHIELD | Admitting: Family Medicine

## 2018-09-09 ENCOUNTER — Other Ambulatory Visit: Payer: Self-pay

## 2018-09-09 VITALS — BP 113/63 | HR 75 | Temp 98.6°F | Resp 16 | Wt 176.2 lb

## 2018-09-09 DIAGNOSIS — Z794 Long term (current) use of insulin: Secondary | ICD-10-CM

## 2018-09-09 DIAGNOSIS — L84 Corns and callosities: Secondary | ICD-10-CM

## 2018-09-09 DIAGNOSIS — E1142 Type 2 diabetes mellitus with diabetic polyneuropathy: Secondary | ICD-10-CM

## 2018-09-09 DIAGNOSIS — J301 Allergic rhinitis due to pollen: Secondary | ICD-10-CM | POA: Diagnosis not present

## 2018-09-09 DIAGNOSIS — J3089 Other allergic rhinitis: Secondary | ICD-10-CM | POA: Diagnosis not present

## 2018-09-09 NOTE — Progress Notes (Signed)
Patient: Cindy Hester Female    DOB: March 12, 1945   73 y.o.   MRN: [id number] Visit Date: 09/09/2018  Today's Provider: Wilhemena Durie, MD   Chief Complaint  Patient presents with  . Skin Problem    skin lesion   Subjective:     HPI  Patient comes in office today with concerns of recent change to her foot. Patient reports that on 09/06/18 she noticed a brown spot on the top of her great toe on left foot. Patient denies symptoms of neuropathy or injury to foot.   Allergies  Allergen Reactions  . Tape     Most tapes cause redness.  Paper tape and tegaderm are OK.     Current Outpatient Medications:  .  amLODipine (NORVASC) 10 MG tablet, TAKE 1 TABLET(10 MG) BY MOUTH DAILY, Disp: 90 tablet, Rfl: 3 .  aspirin 81 MG tablet, Take 1 tablet (81 mg total) daily by mouth., Disp: 30 tablet, Rfl:  .  BIOTIN 5000 PO, Take by mouth daily., Disp: , Rfl:  .  calcium carbonate (OS-CAL) 600 MG TABS tablet, Take by mouth., Disp: , Rfl:  .  cholecalciferol (VITAMIN D) 1000 UNITS tablet, Take 5,000 Units by mouth once a week. , Disp: , Rfl:  .  empagliflozin (JARDIANCE) 25 MG TABS tablet, Take 25 mg by mouth daily., Disp: , Rfl:  .  EPINEPHrine 0.3 mg/0.3 mL IJ SOAJ injection, INJ UTD, Disp: , Rfl: 1 .  fluticasone (FLONASE) 50 MCG/ACT nasal spray, Place into both nostrils as needed for allergies or rhinitis., Disp: , Rfl:  .  furosemide (LASIX) 40 MG tablet, Take 20 mg by mouth as needed. , Disp: , Rfl:  .  metoCLOPramide (REGLAN) 10 MG tablet, Take 1 tablet (10 mg total) by mouth 3 (three) times daily before meals., Disp: 90 tablet, Rfl: 1 .  metoprolol (TOPROL-XL) 200 MG 24 hr tablet, Take 1 tablet (200 mg total) by mouth daily., Disp: 30 tablet, Rfl: 3 .  mometasone (NASONEX) 50 MCG/ACT nasal spray, as needed. , Disp: , Rfl: 0 .  montelukast (SINGULAIR) 10 MG tablet, TAKE 1 TABLET BY MOUTH DAILY. GENERIC EQUIVALENT FOR SINGULAIR., Disp: 90 tablet, Rfl: 3 .  Multiple Vitamin  (MULTIVITAMIN) tablet, Take 1 tablet by mouth daily.  , Disp: , Rfl:  .  Multiple Vitamins-Minerals (HAIR SKIN AND NAILS FORMULA PO), Take by mouth., Disp: , Rfl:  .  omeprazole (PRILOSEC) 20 MG capsule, Take 20 mg by mouth daily., Disp: , Rfl:  .  ondansetron (ZOFRAN ODT) 4 MG disintegrating tablet, Take 1 tablet (4 mg total) by mouth every 8 (eight) hours as needed for nausea or vomiting., Disp: 90 tablet, Rfl: 1 .  OZEMPIC, 0.25 OR 0.5 MG/DOSE, 2 MG/1.5ML SOPN, INJ 0.5 MG [place] ONCE A WEEK, Disp: , Rfl:  .  phentermine 30 MG capsule, Take 30 mg by mouth every morning., Disp: , Rfl:  .  rosuvastatin (CRESTOR) 20 MG tablet, Take 1 tablet (20 mg total) by mouth at bedtime., Disp: 90 tablet, Rfl: 3 .  spironolactone (ALDACTONE) 25 MG tablet, TK 1 T PO QAM, Disp: , Rfl: 1 .  valACYclovir (VALTREX) 500 MG tablet, take 1 tablet by mouth twice a day if needed, Disp: 60 tablet, Rfl: 3 .  valsartan-hydrochlorothiazide (DIOVAN-HCT) 160-25 MG tablet, TAKE 1 TABLET BY MOUTH EVERY MORNING( DISCONTINUE LOSARTAN/HCTZ, Disp: 90 tablet, Rfl: 1 .  vitamin B-12 (CYANOCOBALAMIN) 1000 MCG tablet, Take 1,000 mcg by mouth daily.,  Disp: , Rfl:  .  AMBULATORY NON FORMULARY MEDICATION, Medication Name: FDgard: Please up to 3 times a day as needed (Patient not taking: Reported on 02/21/2018), Disp: 12 capsule, Rfl: 0  Review of Systems  Constitutional: Negative.   Respiratory: Negative.   Cardiovascular: Negative.   Gastrointestinal: Negative.   Skin: Positive for color change.  Neurological: Positive for numbness.  Hematological: Negative.   Psychiatric/Behavioral: Negative.     Social History   Tobacco Use  . Smoking status: Never Smoker  . Smokeless tobacco: Never Used  Substance Use Topics  . Alcohol use: Yes    Comment: maybe 1 drink once a month      Objective:   BP 113/63   Pulse 75   Temp 98.6 F (37 C) (Oral)   Resp 16   Wt 176 lb 3.2 oz (79.9 kg)   BMI 27.60 kg/m  Vitals:   09/09/18 1118   BP: 113/63  Pulse: 75  Resp: 16  Temp: 98.6 F (37 C)  TempSrc: Oral  Weight: 176 lb 3.2 oz (79.9 kg)     Physical Exam Vitals signs reviewed.  Constitutional:      Appearance: She is obese.  HENT:     Head: Normocephalic and atraumatic.  Eyes:     General: No scleral icterus. Cardiovascular:     Rate and Rhythm: Normal rate and regular rhythm.     Heart sounds: Normal heart sounds.  Pulmonary:     Effort: Pulmonary effort is normal.     Breath sounds: Normal breath sounds.  Skin:    General: Skin is warm and dry.     Comments: Small callous on tip of right gtrat toe with apparent ecchymosis under callous.  Neurological:     Mental Status: She is alert.     Comments: Some neuropathy of toes on monofilament exam.      No results found for any visits on 09/09/18.     Assessment & Plan    1. Pre-ulcerative corn or callous Ecchymosis underneath. Will follow for now.  2. Type 2 diabetes mellitus with diabetic polyneuropathy, with long-term current use of insulin ([place]) Per Endocrine.     Nakeda Lebron Cranford Mon, MD  [facility] Fritzi Mandes [place],acting as a scribe for Wilhemena Durie, MD.,have documented all relevant documentation on the behalf of Wilhemena Durie, MD,as directed by  Wilhemena Durie, MD while in the presence of Wilhemena Durie, MD.

## 2018-09-12 ENCOUNTER — Encounter: Payer: Self-pay | Admitting: Cardiology

## 2018-09-15 ENCOUNTER — Encounter: Payer: Self-pay | Admitting: Cardiology

## 2018-09-15 ENCOUNTER — Other Ambulatory Visit: Payer: Self-pay

## 2018-09-15 ENCOUNTER — Ambulatory Visit (INDEPENDENT_AMBULATORY_CARE_PROVIDER_SITE_OTHER): Payer: BC Managed Care – PPO | Admitting: Cardiology

## 2018-09-15 VITALS — BP 132/64 | HR 76 | Ht 67.0 in | Wt 177.5 lb

## 2018-09-15 DIAGNOSIS — E782 Mixed hyperlipidemia: Secondary | ICD-10-CM

## 2018-09-15 DIAGNOSIS — R0602 Shortness of breath: Secondary | ICD-10-CM

## 2018-09-15 DIAGNOSIS — I6523 Occlusion and stenosis of bilateral carotid arteries: Secondary | ICD-10-CM

## 2018-09-15 DIAGNOSIS — I10 Essential (primary) hypertension: Secondary | ICD-10-CM

## 2018-09-15 NOTE — Progress Notes (Signed)
Primary Physician/Referring:  Jerrol Banana., MD  Patient ID: Cindy Hester, female    DOB: 1946/01/01, 73 y.o.   MRN: [id number]  Chief Complaint  Patient presents with  . Shortness of Breath  . Carotid Stenosis  . Follow-up    55mo  HPI:    HPI: Cindy Hester is a 73y.o. Caucasian female patient with type 2 diabetes, hypertension, mild hyperlipidemia, chronic venous insufficiency, lymphedema, obstructive sleep apnea presents for f/u of dyspnea on exertion. She also has asymptomatic mild bilateral carotid stenosis. Patient has lost about 10 pounds in weight since she had stomach upset and felt to be due to metformin, since then has noticed improvement in diabetes, leg edema.  Presently no other specific symptoms except for mild chronic dyspnea.  Past Medical History:  Diagnosis Date  . Arthritis    right shoulder blade, left thumb  . [place]   . GERD (gastroesophageal reflux disease)   . Heart murmur    followed by PCP  . High cholesterol   . History of esophagitis   . History of gastritis   . HLD (hyperlipidemia)   . Hypertension   . Sleep apnea    CPAP   Past Surgical History:  Procedure Laterality Date  . BREAST CYST EXCISION Left    removed two times  . CESAREAN SECTION  1986  . CYST EXCISION     from left breast  . ESOPHAGOGASTRODUODENOSCOPY (EGD) WITH PROPOFOL N/A 12/12/2015   gastritis, LA Grade A reflux esophagitis ESOPHAGOGASTRODUODENOSCOPY (EGD) WITH PROPOFOL;  Surgeon: RManya Silvas MD;  [place]  Service: [place]oscopy;  Laterality: N/A;  Diabetic  . HAMMER TOE SURGERY Bilateral 05/30/2016   Procedure: HAMMER TOE CORRECTION RIGHT 2ND, 3RD, 4TH, 5TH TOES LEFT 5TH TOE;  Surgeon: JSamara Deist DPM;  [place]  Service: Podiatry;  Laterality: Bilateral;  . HEEL SPUR SURGERY Right 2011  . NASAL SINUS SURGERY    . TONSILLECTOMY     Social History   Socioeconomic History  .  Marital status: Single    Spouse name: Not on file  . Number of children: 1  . Years of education: master's  . Highest education level: Not on file  Occupational History  . Occupation: sTraining and development officer [employer]  . Financial resource strain: Not on file  . Food insecurity    Worry: Not on file    Inability: Not on file  . Transportation needs    Medical: Not on file    Non-medical: Not on file  Tobacco Use  . Smoking status: Never Smoker  . Smokeless tobacco: Never Used  Substance and Sexual Activity  . Alcohol use: Yes    Comment: occ  . Drug use: No  . Sexual activity: Not on file  Lifestyle  . Physical activity    Days per week: Not on file    Minutes per session: Not on file  . Stress: Not on file  Relationships  . Social [place] phone: Not on file    Gets together: Not on file    Attends religious service: Not on file    Active member of club or organization: Not on file    Attends meetings of clubs or organizations: Not on file    Relationship status: Not on file  . Intimate partner violence    Fear of  current or ex partner: Not on file    Emotionally abused: Not on file    Physically abused: Not on file    Forced sexual activity: Not on file  Other Topics Concern  . Not on file  Social History Narrative  . Not on file   ROS  Review of Systems  Constitution: Negative for chills, decreased appetite, malaise/fatigue and weight gain.  Cardiovascular: Positive for dyspnea on exertion and leg swelling. Negative for syncope.  Endocrine: Negative for cold intolerance.  Hematologic/Lymphatic: Does not bruise/bleed easily.  Musculoskeletal: Negative for joint swelling.  Gastrointestinal: Negative for abdominal pain, anorexia, change in bowel habit, hematochezia and melena.  Neurological: Negative for headaches and light-headedness.  Psychiatric/Behavioral: Negative for depression and substance abuse.   All other systems reviewed and are negative.  Objective  Blood pressure 132/64, pulse 76, height 5' 7" (1.702 m), weight 177 lb 8 oz (80.5 kg), SpO2 99 %. Body mass index is 27.8 kg/m.   Physical Exam  Constitutional: She appears well-developed and well-nourished. No distress.  HENT:  Head: Atraumatic.  Eyes: Conjunctivae are normal.  Neck: Neck supple. No JVD present. No thyromegaly present.  Cardiovascular: Normal rate, regular rhythm, intact distal pulses and normal pulses. Exam reveals no gallop.  Murmur heard.  Early systolic murmur is present with a grade of 2/6 at the upper right sternal border and apex. Pulses:      Carotid pulses are 2+ on the right side with bruit and 2+ on the left side with bruit. 2+ bilateral pitting edema. No JVD  Pulmonary/Chest: Effort normal and breath sounds normal.  Abdominal: Soft. Bowel sounds are normal.  Musculoskeletal: Normal range of motion.  Neurological: She is alert.  Skin: Skin is warm and dry.  Psychiatric: She has a normal mood and affect.   Radiology: No results found.  Laboratory examination:   02/21/2018: Glucose 135, creatinine 0.73, EGFR 84/96, potassium 4.8, BMP otherwise normal.  12/13/2017: Glucose 137, creatinine 0.76, EGFR 80/92, potassium 5.2, BMP otherwise normal.  11/04/2017: TSH 1.16.  Cholesterol 146, triglycerides 205, HDL 54, LDL 51.  Glucose 148, creatinine 0.73, EGFR 84/96, potassium 4.7, CMP otherwise normal.  CBC normal. CMP Latest Ref Rng & Units 11/04/2017 09/11/2016 04/25/2016  Glucose 65 - 99 mg/dL 148(H) - 106(H)  BUN 8 - 27 mg/dL 19 15 29(H)  Creatinine 0.57 - 1.00 mg/dL 0.73 0.8 0.92  Sodium 134 - 144 mmol/L 139 142 144  Potassium 3.5 - 5.2 mmol/L 4.7 4.1 4.5  Chloride 96 - 106 mmol/L 97 - 102  CO2 20 - 29 mmol/L 27 - 25  Calcium 8.7 - 10.3 mg/dL 10.1 - 9.7  Total Protein 6.0 - 8.5 g/dL 6.5 - 6.6  Total Bilirubin 0.0 - 1.2 mg/dL 0.4 - 0.4  Alkaline Phos 39 - 117 IU/L 72 - 50  AST 0 - 40 IU/L 13 - 22   ALT 0 - 32 IU/L 14 - 22   CBC Latest Ref Rng & Units 11/04/2017 04/25/2016 04/19/2015  WBC 3.4 - 10.8 x10E3/uL 7.7 7.2 5.0  Hemoglobin 11.1 - 15.9 g/dL 14.7 12.5 12.6  Hematocrit 34.0 - 46.6 % 43.9 38.7 39.0  Platelets 150 - 450 x10E3/uL 315 348 332   Lipid Panel     Component Value Date/Time   CHOL 146 11/04/2017 0903   TRIG 205 (H) 11/04/2017 0903   HDL 54 11/04/2017 0903   CHOLHDL 2.7 11/04/2017 0903   LDLCALC 51 11/04/2017 0903   HEMOGLOBIN A1C Lab Results  Component  Value Date   HGBA1C 7.2 09/11/2016   TSH Recent Labs    11/04/17 0903  TSH 1.160   Medications   Current Outpatient Medications  Medication Instructions  . amLODipine (NORVASC) 10 MG tablet TAKE 1 TABLET(10 MG) BY MOUTH DAILY  . aspirin 81 mg, Oral, Daily  . BIOTIN 5000 PO Oral, Daily  . cholecalciferol (VITAMIN D) 5,000 Units, Oral,  Twice a week   . empagliflozin (JARDIANCE) 25 mg, Oral, Daily  . EPINEPHrine 0.3 mg/0.3 mL IJ SOAJ injection INJ UTD  . fluticasone (FLONASE) 50 MCG/ACT nasal spray Each Nare, As needed  . furosemide (LASIX) 20 mg, Oral, As needed  . metoCLOPramide (REGLAN) 10 mg, Oral, Daily PRN  . metoprolol (TOPROL-XL) 200 mg, Oral, Daily  . mometasone (NASONEX) 50 MCG/ACT nasal spray As needed  . montelukast (SINGULAIR) 10 MG tablet TAKE 1 TABLET BY MOUTH DAILY. GENERIC EQUIVALENT FOR SINGULAIR.  . Multiple Vitamin (MULTIVITAMIN) tablet 1 tablet, Daily  . omeprazole (PRILOSEC) 20 mg, Oral, Daily  . ondansetron (ZOFRAN ODT) 4 mg, Oral, Every 8 hours PRN  . OZEMPIC, 0.25 OR 0.5 MG/DOSE, 2 MG/1.5ML SOPN INJ 0.5 MG [place] ONCE A WEEK  . rosuvastatin (CRESTOR) 20 mg, Oral, Daily at bedtime  . spironolactone (ALDACTONE) 25 MG tablet TK 1 T PO QAM  . valACYclovir (VALTREX) 500 MG tablet take 1 tablet by mouth twice a day if needed  . valsartan-hydrochlorothiazide (DIOVAN-HCT) 160-25 MG tablet TAKE 1 TABLET BY MOUTH EVERY MORNING( DISCONTINUE LOSARTAN/HCTZ  . vitamin B-12 (CYANOCOBALAMIN) 1,000  mcg, Oral, Daily    Cardiac Studies:   Lexiscan Sestamibi stress test 01/01/2018: 1. Lexiscan stress test with low level exercise was performed. Patient reached heart rate of 140 bpm, which was 93% of the maximum predicted heart rate. Stress symptoms included dyspnea, nausea, dizziness. Exaggerated blood pressure response seen. Resting hypertension at 166/80 mmHg was seen, with exaggerated response with peak exercise BP reaching 240/70 mmHg. The stress electrocardiogram showed sinus tachycardia, normal stress conduction, no stress arrhythmias and normal stress repolarization. No ischemic changes seen on stress electrocardiogram. 2. The overall quality of the study is excellent. There is no evidence of abnormal lung activity. SPECT images demonstrate very small area of mild intensity, reversible perfusion defect in inferior apical myocardium. Gated SPECT imaging reveals normal myocardial thickening and wall motion. The left ventricular ejection fraction was normal (68%). 3. Low risk study.  Echocardiogram 12/27/2017: Left ventricle cavity is normal in size. Mild concentric hypertrophy of the left ventricle. Normal global wall motion. Visual EF is 50-55%. Doppler evidence of grade I (impaired) diastolic dysfunction, normal LAP. Mild calcification of the mitral valve annulus. Mild mitral valve leaflet calcification. Trace mitral regurgitation. Trace tricuspid regurgitation. Mild calcification of the TV chordae noted. No evidence of pulmonary hypertension. IVC is normal in size with blunter respiratory response and may indicate elevated central venous pressure  Carotid artery duplex 12/27/2017: Stenosis in the right internal carotid artery (16-49%). Stenosis in the left internal carotid artery (16-49%). Antegrade right vertebral artery flow. Antegrade left vertebral artery flow. Follow up in one year is appropriate if clinically indicated.  Assessment     ICD-10-CM   1. Bilateral carotid artery  stenosis  I65.23 EKG 12-Lead  2. Shortness of breath  R06.02 EKG 12-Lead  3. Mixed hyperlipidemia  E78.2   4. Essential hypertension  I10     EKG 09/15/2018: Sinus rhythm with first-degree AV block at rate of 70 bpm, left atrial enlargement, normal axis.  Otherwise normal EKG.  No significant change from  Prior EKG.  Recommendations:   Patient is essentially asymptomatic except for mild chronic stable dyspnea even this is improved with weight loss.  I had a very long discussion and also not to gain any weight, she is already gained about 9 pounds of weight back after she had lost close to 20-25 pounds.  She would like to start phentermine back which I do not see any contraindication from cardiac standpoint as long as blood pressure is being watched.  Carotid artery duplex has been scheduled for annual surveillance.  This was postponed due to Covid 19.  I reviewed her labs, lipids are well controlled, mild hyperlipidemia athletic diet.  Blood pressure is also well controlled.  I'll see her back in a year.  Adrian Prows, MD, [facility] 09/15/2018, 8:43 AM [facility]. [place] Pager: [phone] Office: [phone] If no answer Cell [phone]

## 2018-09-16 DIAGNOSIS — J3089 Other allergic rhinitis: Secondary | ICD-10-CM | POA: Diagnosis not present

## 2018-09-16 DIAGNOSIS — J301 Allergic rhinitis due to pollen: Secondary | ICD-10-CM | POA: Diagnosis not present

## 2018-09-23 ENCOUNTER — Ambulatory Visit (INDEPENDENT_AMBULATORY_CARE_PROVIDER_SITE_OTHER): Payer: BC Managed Care – PPO | Admitting: Family Medicine

## 2018-09-23 ENCOUNTER — Encounter: Payer: Self-pay | Admitting: Family Medicine

## 2018-09-23 ENCOUNTER — Other Ambulatory Visit: Payer: Self-pay

## 2018-09-23 VITALS — BP 112/62 | HR 76 | Temp 99.0°F | Resp 16 | Ht 67.0 in | Wt 176.0 lb

## 2018-09-23 DIAGNOSIS — Z794 Long term (current) use of insulin: Secondary | ICD-10-CM | POA: Diagnosis not present

## 2018-09-23 DIAGNOSIS — I10 Essential (primary) hypertension: Secondary | ICD-10-CM | POA: Diagnosis not present

## 2018-09-23 DIAGNOSIS — I872 Venous insufficiency (chronic) (peripheral): Secondary | ICD-10-CM | POA: Diagnosis not present

## 2018-09-23 DIAGNOSIS — J3089 Other allergic rhinitis: Secondary | ICD-10-CM | POA: Diagnosis not present

## 2018-09-23 DIAGNOSIS — E118 Type 2 diabetes mellitus with unspecified complications: Secondary | ICD-10-CM

## 2018-09-23 DIAGNOSIS — I890 Lymphedema, not elsewhere classified: Secondary | ICD-10-CM

## 2018-09-23 DIAGNOSIS — Z0000 Encounter for general adult medical examination without abnormal findings: Secondary | ICD-10-CM

## 2018-09-23 DIAGNOSIS — G4733 Obstructive sleep apnea (adult) (pediatric): Secondary | ICD-10-CM

## 2018-09-23 DIAGNOSIS — J301 Allergic rhinitis due to pollen: Secondary | ICD-10-CM | POA: Diagnosis not present

## 2018-09-23 NOTE — Progress Notes (Signed)
Patient: Cindy Hester, Female    DOB: Jul 24, 1945, 73 y.o.   MRN: [id number] Visit Date: 09/23/2018  Today's Provider: Wilhemena Durie, MD   Chief Complaint  Patient presents with  . Annual Exam   Subjective:     Annual physical exam Cindy Hester is a 73 y.o. female who presents today for health maintenance and complete physical. She feels well. She reports exercising occasionally. She reports she is sleeping well.  Mammogram- 06/23/2018. Normal. Repeat in 66yr. Colonoscopy-  01/15/2011. Normal. Repeat in 54yrs.  Pap- 12/08/2008. Normal. Pt has GYN appt in 12/2018.  Tdap- 07/19/2009.    Review of Systems  Constitutional: Negative.   HENT: Negative.   Eyes: Negative.   Respiratory: Negative.   Cardiovascular: Negative.   Gastrointestinal: Negative.   Endocrine: Negative.   Genitourinary: Negative.   Musculoskeletal: Negative.   Skin: Negative.   Allergic/Immunologic: Negative.   Hematological: Negative.   Psychiatric/Behavioral: Negative.     Social History      She  reports that she has never smoked. She has never used smokeless tobacco. She reports current alcohol use. She reports that she does not use drugs.       Social History   Socioeconomic History  . Marital status: Single    Spouse name: Not on file  . Number of children: 1  . Years of education: master's  . Highest education level: Not on file  Occupational History  . Occupation: Training and development officer: [employer]  Social Needs  . Financial resource strain: Not on file  . Food insecurity    Worry: Not on file    Inability: Not on file  . Transportation needs    Medical: Not on file    Non-medical: Not on file  Tobacco Use  . Smoking status: Never Smoker  . Smokeless tobacco: Never Used  Substance and Sexual Activity  . Alcohol use: Yes    Comment: occ  . Drug use: No  . Sexual activity: Not on file  Lifestyle  . Physical activity    Days per week: Not on  file    Minutes per session: Not on file  . Stress: Not on file  Relationships  . Social Herbalist on phone: Not on file    Gets together: Not on file    Attends religious service: Not on file    Active member of club or organization: Not on file    Attends meetings of clubs or organizations: Not on file    Relationship status: Not on file  Other Topics Concern  . Not on file  Social History Narrative  . Not on file    Past Medical History:  Diagnosis Date  . Arthritis    right shoulder blade, left thumb  . Diabetes mellitus without complication ([place])   . GERD (gastroesophageal reflux disease)   . Heart murmur    followed by PCP  . High cholesterol   . History of esophagitis   . History of gastritis   . HLD (hyperlipidemia)   . Hypertension   . Sleep apnea    CPAP     Patient Active Problem List   Diagnosis Date Noted  . Chronic venous insufficiency 07/03/2017  . Lymphedema 07/03/2017  . Bilateral lower extremity edema 05/14/2017  . Claudication of left lower extremity ([place]) 05/14/2017  . Allergic rhinitis 07/02/2014  . Diabetes ([place]) 07/02/2014  .  Genital herpes 07/02/2014  . HLD (hyperlipidemia) 07/02/2014  . Adult hypothyroidism 07/02/2014  . Arthritis, degenerative 07/02/2014  . Adiposity 07/02/2014  . Obstructive apnea 07/02/2014  . Avitaminosis D 07/02/2014  . Tendonitis, deQuervains 11/07/2010  . Mucous cyst of toe 11/07/2010  . MEDIAL MENISCUS TEAR, LEFT 06/29/2008  . KNEE PAIN, LEFT 06/01/2008  . TRIGGER FINGER, THUMB 04/20/2008  . AODM 02/03/2008  . ESSENTIAL HYPERTENSION, BENIGN 02/03/2008  . ACHILLES BURSITIS OR TENDINITIS 02/03/2008  . 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 TALIPES CAVUS 02/03/2008    Past Surgical History:  Procedure Laterality Date  . BREAST CYST EXCISION Left    removed two times  . CESAREAN SECTION  1986  . CYST EXCISION     from left breast  . ESOPHAGOGASTRODUODENOSCOPY (EGD) WITH PROPOFOL N/A 12/12/2015    gastritis, LA Grade A reflux esophagitis ESOPHAGOGASTRODUODENOSCOPY (EGD) WITH PROPOFOL;  Surgeon: Manya Silvas, MD;  Location: [place];  Service: Endoscopy;  Laterality: N/A;  Diabetic  . HAMMER TOE SURGERY Bilateral 05/30/2016   Procedure: HAMMER TOE CORRECTION RIGHT 2ND, 3RD, 4TH, 5TH TOES LEFT 5TH TOE;  Surgeon: Samara Deist, DPM;  Location: [place];  Service: Podiatry;  Laterality: Bilateral;  . HEEL SPUR SURGERY Right 2011  . NASAL SINUS SURGERY    . TONSILLECTOMY      Family History        Family Status  Relation Name Status  . Mother  Deceased at age 35  . Father  Deceased at age 51  . Brother  Alive  . Ethlyn Daniels  Deceased  . Ethlyn Daniels  Deceased  . MGM  Deceased  . MGF  Deceased  . PGM  Deceased  . PGF  Deceased  . Brother  Alive  . Neg Hx  (Not Specified)        Her family history includes Breast cancer in her paternal aunt and paternal aunt; CAD in her father; Congestive Heart Failure in her father; Diabetes in her brother and mother; Glaucoma in her father; Hyperlipidemia in her brother; Hypertension in her father and mother; Stroke in her mother. There is no history of Stomach cancer or Colon cancer.      Allergies  Allergen Reactions  . Tape     Most tapes cause redness.  Paper tape and tegaderm are OK.     Current Outpatient Medications:  .  amLODipine (NORVASC) 10 MG tablet, TAKE 1 TABLET(10 MG) BY MOUTH DAILY, Disp: 90 tablet, Rfl: 3 .  aspirin 81 MG tablet, Take 1 tablet (81 mg total) daily by mouth., Disp: 30 tablet, Rfl:  .  BIOTIN 5000 PO, Take by mouth daily., Disp: , Rfl:  .  cholecalciferol (VITAMIN D) 1000 UNITS tablet, Take 5,000 Units by mouth.  Twice a week , Disp: , Rfl:  .  empagliflozin (JARDIANCE) 25 MG TABS tablet, Take 25 mg by mouth daily., Disp: , Rfl:  .  EPINEPHrine 0.3 mg/0.3 mL IJ SOAJ injection, INJ UTD, Disp: , Rfl: 1 .  fluticasone (FLONASE) 50 MCG/ACT nasal spray, Place into both nostrils as needed for allergies  or rhinitis., Disp: , Rfl:  .  furosemide (LASIX) 40 MG tablet, Take 20 mg by mouth as needed. , Disp: , Rfl:  .  metoCLOPramide (REGLAN) 10 MG tablet, Take 10 mg by mouth daily as needed., Disp: , Rfl:  .  metoprolol (TOPROL-XL) 200 MG 24 hr tablet, Take 1 tablet (200 mg total) by mouth daily., Disp: 30 tablet, Rfl: 3 .  mometasone (NASONEX) 50 MCG/ACT nasal spray, as needed. , Disp: , Rfl: 0 .  montelukast (SINGULAIR) 10 MG tablet, TAKE 1 TABLET BY MOUTH DAILY. GENERIC EQUIVALENT FOR SINGULAIR., Disp: 90 tablet, Rfl: 3 .  Multiple Vitamin (MULTIVITAMIN) tablet, Take 1 tablet by mouth daily.  , Disp: , Rfl:  .  omeprazole (PRILOSEC) 20 MG capsule, Take 20 mg by mouth daily., Disp: , Rfl:  .  ondansetron (ZOFRAN ODT) 4 MG disintegrating tablet, Take 1 tablet (4 mg total) by mouth every 8 (eight) hours as needed for nausea or vomiting., Disp: 90 tablet, Rfl: 1 .  OZEMPIC, 0.25 OR 0.5 MG/DOSE, 2 MG/1.5ML SOPN, INJ 0.5 MG [place] ONCE A WEEK, Disp: , Rfl:  .  rosuvastatin (CRESTOR) 20 MG tablet, Take 1 tablet (20 mg total) by mouth at bedtime., Disp: 90 tablet, Rfl: 3 .  spironolactone (ALDACTONE) 25 MG tablet, TK 1 T PO QAM, Disp: , Rfl: 1 .  valACYclovir (VALTREX) 500 MG tablet, take 1 tablet by mouth twice a day if needed, Disp: 60 tablet, Rfl: 3 .  valsartan-hydrochlorothiazide (DIOVAN-HCT) 160-25 MG tablet, TAKE 1 TABLET BY MOUTH EVERY MORNING( DISCONTINUE LOSARTAN/HCTZ, Disp: 90 tablet, Rfl: 1 .  vitamin B-12 (CYANOCOBALAMIN) 1000 MCG tablet, Take 1,000 mcg by mouth daily., Disp: , Rfl:    Patient Care Team: Jerrol Banana., MD as PCP - General (Family Medicine)    Objective:    Vitals: BP 112/62   Pulse 76   Temp 99 F (37.2 C)   Resp 16   Ht 5\' 7"  (1.702 m)   Wt 176 lb (79.8 kg)   SpO2 97%   BMI 27.57 kg/m    Vitals:   09/23/18 0904  BP: 112/62  Pulse: 76  Resp: 16  Temp: 99 F (37.2 C)  SpO2: 97%  Weight: 176 lb (79.8 kg)  Height: 5\' 7"  (1.702 m)     Physical Exam  Vitals signs reviewed.  Constitutional:      Appearance: She is well-developed.  HENT:     Head: Normocephalic and atraumatic.     Right Ear: External ear normal.     Left Ear: External ear normal.     Nose: Nose normal.  Eyes:     General: No scleral icterus.    Conjunctiva/sclera: Conjunctivae normal.  Neck:     Thyroid: No thyromegaly.  Cardiovascular:     Rate and Rhythm: Normal rate and regular rhythm.     Heart sounds: Murmur present.     Comments: Soft 2/6 systolic Murmur Pulmonary:     Effort: Pulmonary effort is normal.     Breath sounds: Normal breath sounds.  Abdominal:     Palpations: Abdomen is soft.  Lymphadenopathy:     Cervical: No cervical adenopathy.  Skin:    General: Skin is warm and dry.  Neurological:     Mental Status: She is alert and oriented to person, place, and time.  Psychiatric:        Behavior: Behavior normal.        Thought Content: Thought content normal.        Judgment: Judgment normal.      Depression Screen PHQ 2/9 Scores 09/23/2018 04/30/2017 04/30/2017 04/25/2016  PHQ - 2 Score 0 0 0 0  PHQ- 9 Score - 0 - 2       Assessment & Plan:     Routine Health Maintenance and Physical Exam  Exercise Activities and Dietary recommendations Goals   None  Immunization History  Administered Date(s) Administered  . Influenza, High Dose Seasonal PF 10/31/2016, 11/04/2017  . Influenza-Unspecified 10/07/2014  . Pneumococcal Conjugate-13 04/13/2014  . Pneumococcal Polysaccharide-23 11/18/2012  . Tdap 07/19/2009  . Zoster 05/26/2009    Health Maintenance  Topic Date Due  . Hepatitis C Screening  [birth year]  . FOOT EXAM  08[birth year]/1957  . HEMOGLOBIN A1C  03/14/2017  . OPHTHALMOLOGY EXAM  08/06/2018  . INFLUENZA VACCINE  09/06/2018  . TETANUS/TDAP  07/20/2019  . MAMMOGRAM  06/22/2020  . COLONOSCOPY  01/14/2021  . DEXA SCAN  Completed  . PNA vac Low Risk Adult  Completed     Discussed health benefits of physical activity, and  encouraged her to engage in regular exercise appropriate for her age and condition.  1. Annual physical exam Dr Donneta Romberg for [place]. - CBC with Differential/Platelet - Comprehensive metabolic panel - Lipid panel - TSH  2. Type 2 diabetes mellitus with complication, with long-term current use of insulin ([place]) Per Dr Gabriel Carina.Ey exam last month. - Hemoglobin A1c  3. Essential hypertension, benign Saw Dr Einar Gip from cardiology last week.  4. Chronic venous insufficiency Has poss lymphedema pump at night.  5. Obstructive apnea Nightly CPAP.  6. Lymphedema On pump.     I, [place], CMA, am acting as a Education administrator for [employer]. Rosanna Randy, [place]   Wilhemena Durie, MD  [facility]

## 2018-09-24 LAB — TSH: TSH: 1.92 u[IU]/mL (ref 0.450–4.500)

## 2018-09-24 LAB — CBC WITH DIFFERENTIAL/PLATELET
Basophils Absolute: 0.1 10*3/uL (ref 0.0–0.2)
Basos: 1 %
EOS (ABSOLUTE): 0.2 10*3/uL (ref 0.0–0.4)
Eos: 2 %
Hematocrit: 41.1 % (ref 34.0–46.6)
Hemoglobin: 13.2 g/dL (ref 11.1–15.9)
Immature Grans (Abs): 0 10*3/uL (ref 0.0–0.1)
Immature Granulocytes: 0 %
Lymphocytes Absolute: 1.5 10*3/uL (ref 0.7–3.1)
Lymphs: 18 %
MCH: 27.6 pg (ref 26.6–33.0)
MCHC: 32.1 g/dL (ref 31.5–35.7)
MCV: 86 fL (ref 79–97)
Monocytes Absolute: 0.7 10*3/uL (ref 0.1–0.9)
Monocytes: 9 %
Neutrophils Absolute: 5.7 10*3/uL (ref 1.4–7.0)
Neutrophils: 70 %
Platelets: 331 10*3/uL (ref 150–450)
RBC: 4.79 x10E6/uL (ref 3.77–5.28)
RDW: 13.3 % (ref 11.7–15.4)
WBC: 8.1 10*3/uL (ref 3.4–10.8)

## 2018-09-24 LAB — COMPREHENSIVE METABOLIC PANEL
ALT: 21 IU/L (ref 0–32)
AST: 16 IU/L (ref 0–40)
Albumin/Globulin Ratio: 2.2 (ref 1.2–2.2)
Albumin: 4.3 g/dL (ref 3.7–4.7)
Alkaline Phosphatase: 71 IU/L (ref 39–117)
BUN/Creatinine Ratio: 15 (ref 12–28)
BUN: 14 mg/dL (ref 8–27)
Bilirubin Total: 0.3 mg/dL (ref 0.0–1.2)
CO2: 23 mmol/L (ref 20–29)
Calcium: 10 mg/dL (ref 8.7–10.3)
Chloride: 100 mmol/L (ref 96–106)
Creatinine, Ser: 0.94 mg/dL (ref 0.57–1.00)
GFR calc Af Amer: 71 mL/min/{1.73_m2} (ref >59)
GFR calc non Af Amer: 61 mL/min/{1.73_m2} (ref >59)
Globulin, Total: 2 g/dL (ref 1.5–4.5)
Glucose: 100 mg/dL — ABNORMAL HIGH (ref 65–99)
Potassium: 4.9 mmol/L (ref 3.5–5.2)
Sodium: 140 mmol/L (ref 134–144)
Total Protein: 6.3 g/dL (ref 6.0–8.5)

## 2018-09-24 LAB — HEMOGLOBIN A1C
Est. average glucose Bld gHb Est-mCnc: 143 mg/dL
Hgb A1c MFr Bld: 6.6 % — ABNORMAL HIGH (ref 4.8–5.6)

## 2018-09-24 LAB — LIPID PANEL
Chol/HDL Ratio: 2.9 ratio (ref 0.0–4.4)
Cholesterol, Total: 162 mg/dL (ref 100–199)
HDL: 55 mg/dL (ref >39)
LDL Calculated: 75 mg/dL (ref 0–99)
Triglycerides: 158 mg/dL — ABNORMAL HIGH (ref 0–149)
VLDL Cholesterol Cal: 32 mg/dL (ref 5–40)

## 2018-09-25 ENCOUNTER — Telehealth: Payer: Self-pay

## 2018-09-25 NOTE — Telephone Encounter (Signed)
-----   Message from Jerrol Banana., MD sent at 09/25/2018  3:08 PM EDT ----- Labs all good.

## 2018-09-25 NOTE — Telephone Encounter (Signed)
Patient advised as below.  

## 2018-10-01 DIAGNOSIS — E1159 Type 2 diabetes mellitus with other circulatory complications: Secondary | ICD-10-CM | POA: Diagnosis not present

## 2018-10-01 DIAGNOSIS — E785 Hyperlipidemia, unspecified: Secondary | ICD-10-CM | POA: Diagnosis not present

## 2018-10-01 DIAGNOSIS — E119 Type 2 diabetes mellitus without complications: Secondary | ICD-10-CM | POA: Diagnosis not present

## 2018-10-01 DIAGNOSIS — E1169 Type 2 diabetes mellitus with other specified complication: Secondary | ICD-10-CM | POA: Diagnosis not present

## 2018-10-02 DIAGNOSIS — J301 Allergic rhinitis due to pollen: Secondary | ICD-10-CM | POA: Diagnosis not present

## 2018-10-02 DIAGNOSIS — J3089 Other allergic rhinitis: Secondary | ICD-10-CM | POA: Diagnosis not present

## 2018-10-07 DIAGNOSIS — J3089 Other allergic rhinitis: Secondary | ICD-10-CM | POA: Diagnosis not present

## 2018-10-07 DIAGNOSIS — J301 Allergic rhinitis due to pollen: Secondary | ICD-10-CM | POA: Diagnosis not present

## 2018-10-14 DIAGNOSIS — J301 Allergic rhinitis due to pollen: Secondary | ICD-10-CM | POA: Diagnosis not present

## 2018-10-14 DIAGNOSIS — J3089 Other allergic rhinitis: Secondary | ICD-10-CM | POA: Diagnosis not present

## 2018-10-21 DIAGNOSIS — J301 Allergic rhinitis due to pollen: Secondary | ICD-10-CM | POA: Diagnosis not present

## 2018-10-21 DIAGNOSIS — J3089 Other allergic rhinitis: Secondary | ICD-10-CM | POA: Diagnosis not present

## 2018-10-28 DIAGNOSIS — J3089 Other allergic rhinitis: Secondary | ICD-10-CM | POA: Diagnosis not present

## 2018-10-28 DIAGNOSIS — J301 Allergic rhinitis due to pollen: Secondary | ICD-10-CM | POA: Diagnosis not present

## 2018-11-04 ENCOUNTER — Other Ambulatory Visit: Payer: Self-pay | Admitting: Family Medicine

## 2018-11-04 DIAGNOSIS — J301 Allergic rhinitis due to pollen: Secondary | ICD-10-CM | POA: Diagnosis not present

## 2018-11-04 DIAGNOSIS — J3089 Other allergic rhinitis: Secondary | ICD-10-CM | POA: Diagnosis not present

## 2018-11-04 DIAGNOSIS — E119 Type 2 diabetes mellitus without complications: Secondary | ICD-10-CM | POA: Diagnosis not present

## 2018-11-04 LAB — HM DIABETES EYE EXAM

## 2018-11-06 DIAGNOSIS — J3089 Other allergic rhinitis: Secondary | ICD-10-CM | POA: Diagnosis not present

## 2018-11-06 DIAGNOSIS — J301 Allergic rhinitis due to pollen: Secondary | ICD-10-CM | POA: Diagnosis not present

## 2018-11-11 DIAGNOSIS — J3089 Other allergic rhinitis: Secondary | ICD-10-CM | POA: Diagnosis not present

## 2018-11-11 DIAGNOSIS — J301 Allergic rhinitis due to pollen: Secondary | ICD-10-CM | POA: Diagnosis not present

## 2018-11-12 DIAGNOSIS — H5213 Myopia, bilateral: Secondary | ICD-10-CM | POA: Diagnosis not present

## 2018-11-13 DIAGNOSIS — Z23 Encounter for immunization: Secondary | ICD-10-CM | POA: Diagnosis not present

## 2018-11-18 DIAGNOSIS — J3089 Other allergic rhinitis: Secondary | ICD-10-CM | POA: Diagnosis not present

## 2018-11-18 DIAGNOSIS — J301 Allergic rhinitis due to pollen: Secondary | ICD-10-CM | POA: Diagnosis not present

## 2018-11-25 DIAGNOSIS — J301 Allergic rhinitis due to pollen: Secondary | ICD-10-CM | POA: Diagnosis not present

## 2018-11-25 DIAGNOSIS — G4733 Obstructive sleep apnea (adult) (pediatric): Secondary | ICD-10-CM | POA: Diagnosis not present

## 2018-11-25 DIAGNOSIS — J3089 Other allergic rhinitis: Secondary | ICD-10-CM | POA: Diagnosis not present

## 2018-12-02 DIAGNOSIS — J301 Allergic rhinitis due to pollen: Secondary | ICD-10-CM | POA: Diagnosis not present

## 2018-12-02 DIAGNOSIS — J3089 Other allergic rhinitis: Secondary | ICD-10-CM | POA: Diagnosis not present

## 2018-12-08 ENCOUNTER — Other Ambulatory Visit: Payer: Self-pay

## 2018-12-08 MED ORDER — VALSARTAN-HYDROCHLOROTHIAZIDE 160-25 MG PO TABS: ORAL_TABLET | ORAL | 1 refills | Status: DC

## 2018-12-09 DIAGNOSIS — J301 Allergic rhinitis due to pollen: Secondary | ICD-10-CM | POA: Diagnosis not present

## 2018-12-09 DIAGNOSIS — J3089 Other allergic rhinitis: Secondary | ICD-10-CM | POA: Diagnosis not present

## 2018-12-16 DIAGNOSIS — J3089 Other allergic rhinitis: Secondary | ICD-10-CM | POA: Diagnosis not present

## 2018-12-22 ENCOUNTER — Other Ambulatory Visit: Payer: Self-pay | Admitting: Obstetrics and Gynecology

## 2018-12-22 DIAGNOSIS — Z1231 Encounter for screening mammogram for malignant neoplasm of breast: Secondary | ICD-10-CM

## 2018-12-23 DIAGNOSIS — J3089 Other allergic rhinitis: Secondary | ICD-10-CM | POA: Diagnosis not present

## 2018-12-23 DIAGNOSIS — J301 Allergic rhinitis due to pollen: Secondary | ICD-10-CM | POA: Diagnosis not present

## 2018-12-30 ENCOUNTER — Ambulatory Visit (INDEPENDENT_AMBULATORY_CARE_PROVIDER_SITE_OTHER): Payer: BC Managed Care – PPO

## 2018-12-30 ENCOUNTER — Other Ambulatory Visit: Payer: Self-pay

## 2018-12-30 DIAGNOSIS — R0989 Other specified symptoms and signs involving the circulatory and respiratory systems: Secondary | ICD-10-CM | POA: Diagnosis not present

## 2019-01-03 ENCOUNTER — Other Ambulatory Visit: Payer: Self-pay | Admitting: Cardiology

## 2019-01-03 DIAGNOSIS — I6523 Occlusion and stenosis of bilateral carotid arteries: Secondary | ICD-10-CM

## 2019-01-05 NOTE — Progress Notes (Signed)
LVM with results

## 2019-01-06 DIAGNOSIS — Z01419 Encounter for gynecological examination (general) (routine) without abnormal findings: Secondary | ICD-10-CM | POA: Diagnosis not present

## 2019-01-06 DIAGNOSIS — J3089 Other allergic rhinitis: Secondary | ICD-10-CM | POA: Diagnosis not present

## 2019-01-06 DIAGNOSIS — J301 Allergic rhinitis due to pollen: Secondary | ICD-10-CM | POA: Diagnosis not present

## 2019-01-06 DIAGNOSIS — Z124 Encounter for screening for malignant neoplasm of cervix: Secondary | ICD-10-CM | POA: Diagnosis not present

## 2019-01-06 DIAGNOSIS — H1045 Other chronic allergic conjunctivitis: Secondary | ICD-10-CM | POA: Diagnosis not present

## 2019-01-09 ENCOUNTER — Telehealth: Payer: Self-pay

## 2019-01-09 NOTE — Telephone Encounter (Signed)
Copied from [place] [phone]. Topic: Complaint - Billing/Coding >> Dec 23, 2018 10:26 AM Yvette Rack wrote: DOS: 09/09/18 Details of complaint: Pt stated she keeps receiving a bill for the visit and she has called several times (12/03/18 and 12/10/18 and spoke with Helene Kelp and Arbie Cookey) to request that the billing be resubmitted with the correct coding. How would the patient like to see this issue resolved? Pt would like the coding to be correct and resubmitted so her insurance can pay for the visit. Pt requests call back. Cb# [phone]   Will automatically be routed to [facility] pool. >> Jan 09, 2019  2:52 PM Scherrie Gerlach wrote: Pt states she just got off phone with [employer] and they do NOT have the corrected claim that was sent on 12/09/18. Pt would like you to resubmit claim again please. Pt states Arbie Cookey helped her with this in beginning. >> Dec 23, 2018 12:58 PM Sula Rumple wrote: [place] was sent back to [facility] on 12/09/2018.  Pt called and advised.

## 2019-01-09 NOTE — Telephone Encounter (Signed)
Spoke to [place] regarding this bill.   Per Cornelious Bryant, our account speicialist and Coding Dept., the dx code cannot be changed due to documentation in her office visit note for 09/09/18  therefore the patient is responsible for the bill.

## 2019-01-12 ENCOUNTER — Other Ambulatory Visit: Payer: Self-pay | Admitting: Obstetrics and Gynecology

## 2019-01-12 DIAGNOSIS — Z13820 Encounter for screening for osteoporosis: Secondary | ICD-10-CM

## 2019-01-13 DIAGNOSIS — J301 Allergic rhinitis due to pollen: Secondary | ICD-10-CM | POA: Diagnosis not present

## 2019-01-13 DIAGNOSIS — J3089 Other allergic rhinitis: Secondary | ICD-10-CM | POA: Diagnosis not present

## 2019-01-20 DIAGNOSIS — J3089 Other allergic rhinitis: Secondary | ICD-10-CM | POA: Diagnosis not present

## 2019-01-20 DIAGNOSIS — J301 Allergic rhinitis due to pollen: Secondary | ICD-10-CM | POA: Diagnosis not present

## 2019-01-27 DIAGNOSIS — J3089 Other allergic rhinitis: Secondary | ICD-10-CM | POA: Diagnosis not present

## 2019-01-27 DIAGNOSIS — J301 Allergic rhinitis due to pollen: Secondary | ICD-10-CM | POA: Diagnosis not present

## 2019-02-02 ENCOUNTER — Other Ambulatory Visit: Payer: Self-pay

## 2019-02-02 DIAGNOSIS — I6523 Occlusion and stenosis of bilateral carotid arteries: Secondary | ICD-10-CM

## 2019-02-02 MED ORDER — SPIRONOLACTONE 25 MG PO TABS: 25.0000 mg | ORAL_TABLET | Freq: Once | ORAL | 1 refills | Status: DC

## 2019-02-03 DIAGNOSIS — J301 Allergic rhinitis due to pollen: Secondary | ICD-10-CM | POA: Diagnosis not present

## 2019-02-03 DIAGNOSIS — J3089 Other allergic rhinitis: Secondary | ICD-10-CM | POA: Diagnosis not present

## 2019-02-10 DIAGNOSIS — J301 Allergic rhinitis due to pollen: Secondary | ICD-10-CM | POA: Diagnosis not present

## 2019-02-10 DIAGNOSIS — J3089 Other allergic rhinitis: Secondary | ICD-10-CM | POA: Diagnosis not present

## 2019-02-11 DIAGNOSIS — E559 Vitamin D deficiency, unspecified: Secondary | ICD-10-CM | POA: Diagnosis not present

## 2019-02-11 DIAGNOSIS — E1159 Type 2 diabetes mellitus with other circulatory complications: Secondary | ICD-10-CM | POA: Diagnosis not present

## 2019-02-11 DIAGNOSIS — E1169 Type 2 diabetes mellitus with other specified complication: Secondary | ICD-10-CM | POA: Diagnosis not present

## 2019-02-11 DIAGNOSIS — E119 Type 2 diabetes mellitus without complications: Secondary | ICD-10-CM | POA: Diagnosis not present

## 2019-02-11 LAB — HEMOGLOBIN A1C: Hemoglobin A1C: 7.1

## 2019-02-11 LAB — MICROALBUMIN, URINE: Microalb, Ur: <7

## 2019-02-11 LAB — VITAMIN D 25 HYDROXY (VIT D DEFICIENCY, FRACTURES): Vit D, 25-Hydroxy: 41.4

## 2019-02-17 DIAGNOSIS — J301 Allergic rhinitis due to pollen: Secondary | ICD-10-CM | POA: Diagnosis not present

## 2019-02-17 DIAGNOSIS — J3089 Other allergic rhinitis: Secondary | ICD-10-CM | POA: Diagnosis not present

## 2019-02-24 DIAGNOSIS — J3089 Other allergic rhinitis: Secondary | ICD-10-CM | POA: Diagnosis not present

## 2019-02-24 DIAGNOSIS — J301 Allergic rhinitis due to pollen: Secondary | ICD-10-CM | POA: Diagnosis not present

## 2019-02-27 ENCOUNTER — Other Ambulatory Visit: Payer: Self-pay | Admitting: Family Medicine

## 2019-03-05 DIAGNOSIS — J301 Allergic rhinitis due to pollen: Secondary | ICD-10-CM | POA: Diagnosis not present

## 2019-03-05 DIAGNOSIS — J3089 Other allergic rhinitis: Secondary | ICD-10-CM | POA: Diagnosis not present

## 2019-03-10 DIAGNOSIS — J3089 Other allergic rhinitis: Secondary | ICD-10-CM | POA: Diagnosis not present

## 2019-03-10 DIAGNOSIS — J301 Allergic rhinitis due to pollen: Secondary | ICD-10-CM | POA: Diagnosis not present

## 2019-03-17 DIAGNOSIS — J301 Allergic rhinitis due to pollen: Secondary | ICD-10-CM | POA: Diagnosis not present

## 2019-03-17 DIAGNOSIS — J3089 Other allergic rhinitis: Secondary | ICD-10-CM | POA: Diagnosis not present

## 2019-03-24 NOTE — Progress Notes (Signed)
Patient: Cindy Hester Female    DOB: [birth year]   74 y.o.   MRN: [id number] Visit Date: 03/25/2019  Today's Provider: Wilhemena Durie, MD   Chief Complaint  Patient presents with  . Hypertension   Subjective:     HPI  Patient still works full-time.  She feels well.  She had her first Covid vaccine a few days ago. Type 2 diabetes mellitus with diabetic polyneuropathy, with long-term current use of insulin (HCC) From 09/23/2018-hemoglobin A1c 6.6. Diabetes managed by [facility]. Most recent A1C was 7.1 on 02/11/2019.   Hypertension From 09/23/2018- no changes.  Mixed hyperlipidemia From 09/23/2018-labs checked showing-labs were all good.  She does complain of occasional nausea.  Described no abdominal pain.  Had Zofran in the past for this which helps a little.  Apparently Reglan helped a good bit before.  The nausea is not consistent but she has had a little bit more frequently lately.  She describes a lot of belching with this also.  She is on omeprazole. Allergies  Allergen Reactions  . Tape     Most tapes cause redness.  Paper tape and tegaderm are OK.     Current Outpatient Medications:  .  amLODipine (NORVASC) 10 MG tablet, TAKE 1 TABLET(10 MG) BY MOUTH DAILY, Disp: 90 tablet, Rfl: 3 .  aspirin 81 MG tablet, Take 1 tablet (81 mg total) daily by mouth., Disp: 30 tablet, Rfl:  .  BIOTIN 5000 PO, Take by mouth daily., Disp: , Rfl:  .  cholecalciferol (VITAMIN D) 1000 UNITS tablet, Take 5,000 Units by mouth.  Twice a week , Disp: , Rfl:  .  empagliflozin (JARDIANCE) 25 MG TABS tablet, Take 25 mg by mouth daily., Disp: , Rfl:  .  EPINEPHrine 0.3 mg/0.3 mL IJ SOAJ injection, INJ UTD, Disp: , Rfl: 1 .  fluticasone (FLONASE) 50 MCG/ACT nasal spray, Place into both nostrils as needed for allergies or rhinitis., Disp: , Rfl:  .  furosemide (LASIX) 40 MG tablet, Take 20 mg by mouth as needed. , Disp: , Rfl:  .  Glucosamine-Chondroit-Vit C-Mn  (GLUCOSAMINE CHONDR 1500 COMPLX PO), Take 1 tablet by mouth daily., Disp: , Rfl:  .  metoCLOPramide (REGLAN) 10 MG tablet, Take 10 mg by mouth daily as needed., Disp: , Rfl:  .  metoprolol (TOPROL-XL) 200 MG 24 hr tablet, TAKE 1 TABLET(200 MG) BY MOUTH DAILY, Disp: 30 tablet, Rfl: 3 .  mometasone (NASONEX) 50 MCG/ACT nasal spray, as needed. , Disp: , Rfl: 0 .  montelukast (SINGULAIR) 10 MG tablet, TAKE 1 TABLET BY MOUTH DAILY. GENERIC EQUIVALENT FOR SINGULAIR., Disp: 90 tablet, Rfl: 3 .  Multiple Vitamin (MULTIVITAMIN) tablet, Take 1 tablet by mouth daily.  , Disp: , Rfl:  .  omeprazole (PRILOSEC) 20 MG capsule, Take 20 mg by mouth daily., Disp: , Rfl:  .  ondansetron (ZOFRAN ODT) 4 MG disintegrating tablet, Take 1 tablet (4 mg total) by mouth every 8 (eight) hours as needed for nausea or vomiting., Disp: 90 tablet, Rfl: 1 .  OZEMPIC, 0.25 OR 0.5 MG/DOSE, 2 MG/1.5ML SOPN, INJ 0.5 MG [place] ONCE A WEEK, Disp: , Rfl:  .  phentermine (ADIPEX-P) 37.5 MG tablet, Take 1 tablet by mouth daily., Disp: , Rfl:  .  rosuvastatin (CRESTOR) 20 MG tablet, Take 1 tablet (20 mg total) by mouth at bedtime., Disp: 90 tablet, Rfl: 3 .  spironolactone (ALDACTONE) 25 MG tablet, Take 1 tablet (25 mg total) by mouth  once for 1 dose., Disp: 90 tablet, Rfl: 1 .  valACYclovir (VALTREX) 500 MG tablet, take 1 tablet by mouth twice a day if needed, Disp: 60 tablet, Rfl: 3 .  valsartan-hydrochlorothiazide (DIOVAN-HCT) 160-25 MG tablet, TAKE 1 TABLET BY MOUTH EVERY MORNING( DISCONTINUE LOSARTAN/HCTZ, Disp: 90 tablet, Rfl: 1 .  vitamin B-12 (CYANOCOBALAMIN) 1000 MCG tablet, Take 1,000 mcg by mouth daily., Disp: , Rfl:   Review of Systems  Constitutional: Negative for appetite change, chills, fatigue and fever.  HENT: Negative.   Eyes: Negative.   Respiratory: Negative for chest tightness and shortness of breath.   Cardiovascular: Negative for chest pain and palpitations.  Gastrointestinal: Positive for abdominal pain, constipation  and diarrhea. Negative for nausea and vomiting.       Belching  Endocrine: Negative.   Musculoskeletal: Positive for arthralgias.  Allergic/Immunologic: Negative.   Neurological: Negative for dizziness and weakness.  Psychiatric/Behavioral: Negative.     Social History   Tobacco Use  . Smoking status: Never Smoker  . Smokeless tobacco: Never Used  Substance Use Topics  . Alcohol use: Yes    Comment: occ      Objective:   BP 120/60 (BP Location: Right Arm, Patient Position: Sitting, Cuff Size: Large)   Pulse 72   Temp (!) 96.6 F (35.9 C) (Temporal)   Resp 16   Wt 185 lb (83.9 kg)   SpO2 99% Comment: room air  BMI 28.98 kg/m  Vitals:   03/25/19 0902  BP: 120/60  Pulse: 72  Resp: 16  Temp: (!) 96.6 F (35.9 C)  TempSrc: Temporal  SpO2: 99%  Weight: 185 lb (83.9 kg)  Body mass index is 28.98 kg/m.   Physical Exam Vitals reviewed.  Constitutional:      Appearance: She is well-developed.  HENT:     Head: Normocephalic and atraumatic.     Right Ear: External ear normal.     Left Ear: External ear normal.     Nose: Nose normal.  Eyes:     General: No scleral icterus.    Conjunctiva/sclera: Conjunctivae normal.  Neck:     Thyroid: No thyromegaly.  Cardiovascular:     Rate and Rhythm: Normal rate and regular rhythm.     Heart sounds: Murmur present.     Comments: Soft 2/6 systolic Murmur Pulmonary:     Effort: Pulmonary effort is normal.     Breath sounds: Normal breath sounds.  Abdominal:     Palpations: Abdomen is soft.  Lymphadenopathy:     Cervical: No cervical adenopathy.  Skin:    General: Skin is warm and dry.  Neurological:     Mental Status: She is alert and oriented to person, place, and time.  Psychiatric:        Behavior: Behavior normal.        Thought Content: Thought content normal.        Judgment: Judgment normal.      Results for orders placed or performed in visit on 03/25/19  Microalbumin, urine  Result Value Ref Range    Microalb, Ur <7   VITAMIN D 25 Hydroxy (Vit-D Deficiency, Fractures)  Result Value Ref Range   Vit D, 25-Hydroxy 41.4   Hemoglobin A1c  Result Value Ref Range   Hemoglobin A1C 7.1        Assessment & Plan    1. Chronic venous insufficiency   2. Obstructive apnea On CPAP.  3. Type 2 diabetes mellitus with diabetic polyneuropathy, with long-term current use of insulin (  [place]) Followed by endocrinology.  4. Adult hypothyroidism TSH in normal range at 1.9  5. Lymphedema   6. Bilateral carotid artery stenosis Followed by cardiology.  7. Diabetic gastropathy ([place]) May need referral back to GI.  The Reglan has helped before for this nausea.  At this time will cautiously tell her to use it as needed.  Advised patient to use this as little as possible.  Again will refer to GI if this is persistent.  See her back at the end of the summer.     I,Roshena L Chambers,acting as a scribe for Wilhemena Durie, MD.,have documented all relevant documentation on the behalf of Wilhemena Durie, MD,as directed by  Wilhemena Durie, MD while in the presence of Wilhemena Durie, MD.   Wilhemena Durie, MD  [facility]

## 2019-03-25 ENCOUNTER — Ambulatory Visit: Payer: BC Managed Care – PPO | Admitting: Family Medicine

## 2019-03-25 ENCOUNTER — Encounter: Payer: Self-pay | Admitting: Family Medicine

## 2019-03-25 ENCOUNTER — Other Ambulatory Visit: Payer: Self-pay

## 2019-03-25 VITALS — BP 120/60 | HR 72 | Temp 96.6°F | Resp 16 | Wt 185.0 lb

## 2019-03-25 DIAGNOSIS — E039 Hypothyroidism, unspecified: Secondary | ICD-10-CM

## 2019-03-25 DIAGNOSIS — K3189 Other diseases of stomach and duodenum: Secondary | ICD-10-CM

## 2019-03-25 DIAGNOSIS — G4733 Obstructive sleep apnea (adult) (pediatric): Secondary | ICD-10-CM

## 2019-03-25 DIAGNOSIS — E1142 Type 2 diabetes mellitus with diabetic polyneuropathy: Secondary | ICD-10-CM | POA: Diagnosis not present

## 2019-03-25 DIAGNOSIS — Z794 Long term (current) use of insulin: Secondary | ICD-10-CM

## 2019-03-25 DIAGNOSIS — I872 Venous insufficiency (chronic) (peripheral): Secondary | ICD-10-CM

## 2019-03-25 DIAGNOSIS — I890 Lymphedema, not elsewhere classified: Secondary | ICD-10-CM

## 2019-03-25 DIAGNOSIS — E1169 Type 2 diabetes mellitus with other specified complication: Secondary | ICD-10-CM

## 2019-03-25 DIAGNOSIS — I6523 Occlusion and stenosis of bilateral carotid arteries: Secondary | ICD-10-CM

## 2019-03-25 DIAGNOSIS — K319 Disease of stomach and duodenum, unspecified: Secondary | ICD-10-CM

## 2019-03-27 DIAGNOSIS — J3089 Other allergic rhinitis: Secondary | ICD-10-CM | POA: Diagnosis not present

## 2019-03-27 DIAGNOSIS — J301 Allergic rhinitis due to pollen: Secondary | ICD-10-CM | POA: Diagnosis not present

## 2019-04-02 DIAGNOSIS — J3089 Other allergic rhinitis: Secondary | ICD-10-CM | POA: Diagnosis not present

## 2019-04-02 DIAGNOSIS — J301 Allergic rhinitis due to pollen: Secondary | ICD-10-CM | POA: Diagnosis not present

## 2019-04-07 DIAGNOSIS — J301 Allergic rhinitis due to pollen: Secondary | ICD-10-CM | POA: Diagnosis not present

## 2019-04-07 DIAGNOSIS — J3089 Other allergic rhinitis: Secondary | ICD-10-CM | POA: Diagnosis not present

## 2019-04-13 DIAGNOSIS — J301 Allergic rhinitis due to pollen: Secondary | ICD-10-CM | POA: Diagnosis not present

## 2019-04-13 DIAGNOSIS — J3089 Other allergic rhinitis: Secondary | ICD-10-CM | POA: Diagnosis not present

## 2019-04-13 DIAGNOSIS — G4730 Sleep apnea, unspecified: Secondary | ICD-10-CM | POA: Diagnosis not present

## 2019-04-14 DIAGNOSIS — J3089 Other allergic rhinitis: Secondary | ICD-10-CM | POA: Diagnosis not present

## 2019-04-14 DIAGNOSIS — J301 Allergic rhinitis due to pollen: Secondary | ICD-10-CM | POA: Diagnosis not present

## 2019-04-15 DIAGNOSIS — G4730 Sleep apnea, unspecified: Secondary | ICD-10-CM | POA: Diagnosis not present

## 2019-04-21 DIAGNOSIS — J3089 Other allergic rhinitis: Secondary | ICD-10-CM | POA: Diagnosis not present

## 2019-04-21 DIAGNOSIS — J301 Allergic rhinitis due to pollen: Secondary | ICD-10-CM | POA: Diagnosis not present

## 2019-04-28 DIAGNOSIS — J301 Allergic rhinitis due to pollen: Secondary | ICD-10-CM | POA: Diagnosis not present

## 2019-04-28 DIAGNOSIS — J3089 Other allergic rhinitis: Secondary | ICD-10-CM | POA: Diagnosis not present

## 2019-05-05 ENCOUNTER — Other Ambulatory Visit: Payer: Self-pay | Admitting: Family Medicine

## 2019-05-05 DIAGNOSIS — J3089 Other allergic rhinitis: Secondary | ICD-10-CM | POA: Diagnosis not present

## 2019-05-05 DIAGNOSIS — J301 Allergic rhinitis due to pollen: Secondary | ICD-10-CM | POA: Diagnosis not present

## 2019-05-05 NOTE — Telephone Encounter (Signed)
Requested Prescriptions  Pending Prescriptions Disp Refills  . montelukast (SINGULAIR) 10 MG tablet [Pharmacy Med Name: MONTELUKAST 10MG  TABLETS] 90 tablet 3    Sig: TAKE 1 TABLET BY MOUTH DAILY     Pulmonology:  Leukotriene Inhibitors Passed - 05/05/2019  3:24 AM      Passed - Valid encounter within last 12 months    Recent Outpatient Visits          1 month ago Chronic venous insufficiency   [facility] Jerrol Banana., MD   7 months ago Annual physical exam   [facility] Jerrol Banana., MD   7 months ago Pre-ulcerative corn or callous   [facility] Jerrol Banana., MD   1 year ago Essential hypertension   [facility] Jerrol Banana., MD   1 year ago [place] Jerrol Banana., MD      Future Appointments            In 4 months Adrian Prows, MD [facility]

## 2019-05-12 DIAGNOSIS — J3089 Other allergic rhinitis: Secondary | ICD-10-CM | POA: Diagnosis not present

## 2019-05-12 DIAGNOSIS — J301 Allergic rhinitis due to pollen: Secondary | ICD-10-CM | POA: Diagnosis not present

## 2019-05-19 DIAGNOSIS — J3089 Other allergic rhinitis: Secondary | ICD-10-CM | POA: Diagnosis not present

## 2019-05-19 DIAGNOSIS — J301 Allergic rhinitis due to pollen: Secondary | ICD-10-CM | POA: Diagnosis not present

## 2019-05-26 DIAGNOSIS — J3089 Other allergic rhinitis: Secondary | ICD-10-CM | POA: Diagnosis not present

## 2019-05-26 DIAGNOSIS — J301 Allergic rhinitis due to pollen: Secondary | ICD-10-CM | POA: Diagnosis not present

## 2019-06-02 ENCOUNTER — Other Ambulatory Visit: Payer: Self-pay | Admitting: Cardiology

## 2019-06-02 DIAGNOSIS — J301 Allergic rhinitis due to pollen: Secondary | ICD-10-CM | POA: Diagnosis not present

## 2019-06-02 DIAGNOSIS — J3089 Other allergic rhinitis: Secondary | ICD-10-CM | POA: Diagnosis not present

## 2019-06-09 DIAGNOSIS — J301 Allergic rhinitis due to pollen: Secondary | ICD-10-CM | POA: Diagnosis not present

## 2019-06-09 DIAGNOSIS — J3089 Other allergic rhinitis: Secondary | ICD-10-CM | POA: Diagnosis not present

## 2019-06-16 DIAGNOSIS — J3089 Other allergic rhinitis: Secondary | ICD-10-CM | POA: Diagnosis not present

## 2019-06-16 DIAGNOSIS — J301 Allergic rhinitis due to pollen: Secondary | ICD-10-CM | POA: Diagnosis not present

## 2019-06-17 DIAGNOSIS — E119 Type 2 diabetes mellitus without complications: Secondary | ICD-10-CM | POA: Diagnosis not present

## 2019-06-20 ENCOUNTER — Other Ambulatory Visit: Payer: Self-pay | Admitting: Family Medicine

## 2019-06-20 NOTE — Telephone Encounter (Signed)
Requested Prescriptions  Pending Prescriptions Disp Refills  . amLODipine (NORVASC) 10 MG tablet [Pharmacy Med Name: AMLODIPINE BESYLATE 10MG  TABLETS] 90 tablet 3    Sig: TAKE 1 TABLET(10 MG) BY MOUTH DAILY     Cardiovascular:  Calcium Channel Blockers Passed - 06/20/2019  8:24 AM      Passed - Last BP in normal range    BP Readings from Last 1 Encounters:  03/25/19 120/60         Passed - Valid encounter within last 6 months    Recent Outpatient Visits          2 months ago Chronic venous insufficiency   [facility] Jerrol Banana., MD   9 months ago Annual physical exam   [facility] Jerrol Banana., MD   9 months ago Pre-ulcerative corn or callous   [facility] Jerrol Banana., MD   1 year ago Essential hypertension   [facility] Jerrol Banana., MD   1 year ago [place] Jerrol Banana., MD      Future Appointments            In 2 months Adrian Prows, MD [facility]

## 2019-06-23 ENCOUNTER — Other Ambulatory Visit: Payer: Self-pay | Admitting: Family Medicine

## 2019-06-23 NOTE — Telephone Encounter (Signed)
Requested Prescriptions  Pending Prescriptions Disp Refills  . metoprolol (TOPROL-XL) 200 MG 24 hr tablet [Pharmacy Med Name: METOPROLOL ER SUCCINATE 200MG  TABS] 90 tablet 0    Sig: TAKE 1 TABLET(200 MG) BY MOUTH DAILY     Cardiovascular:  Beta Blockers Passed - 06/23/2019  3:31 AM      Passed - Last BP in normal range    BP Readings from Last 1 Encounters:  03/25/19 120/60         Passed - Last Heart Rate in normal range    Pulse Readings from Last 1 Encounters:  03/25/19 72         Passed - Valid encounter within last 6 months    Recent Outpatient Visits          3 months ago Chronic venous insufficiency   [facility] Jerrol Banana., MD   9 months ago Annual physical exam   [facility] Jerrol Banana., MD   9 months ago Pre-ulcerative corn or callous   [facility] Jerrol Banana., MD   1 year ago Essential hypertension   [facility] Jerrol Banana., MD   1 year ago [place] Jerrol Banana., MD      Future Appointments            In 2 months Adrian Prows, MD [facility]

## 2019-06-25 ENCOUNTER — Ambulatory Visit
Admission: RE | Admit: 2019-06-25 | Discharge: 2019-06-25 | Disposition: A | Discharge status: Home / Self Care | Payer: BC Managed Care – PPO | Source: Ambulatory Visit | Attending: Obstetrics and Gynecology | Admitting: Obstetrics and Gynecology

## 2019-06-25 DIAGNOSIS — J301 Allergic rhinitis due to pollen: Secondary | ICD-10-CM | POA: Diagnosis not present

## 2019-06-25 DIAGNOSIS — J3089 Other allergic rhinitis: Secondary | ICD-10-CM | POA: Diagnosis not present

## 2019-06-25 DIAGNOSIS — Z1231 Encounter for screening mammogram for malignant neoplasm of breast: Secondary | ICD-10-CM | POA: Diagnosis not present

## 2019-07-02 DIAGNOSIS — J3089 Other allergic rhinitis: Secondary | ICD-10-CM | POA: Diagnosis not present

## 2019-07-09 DIAGNOSIS — J3089 Other allergic rhinitis: Secondary | ICD-10-CM | POA: Diagnosis not present

## 2019-07-14 DIAGNOSIS — J3089 Other allergic rhinitis: Secondary | ICD-10-CM | POA: Diagnosis not present

## 2019-07-14 DIAGNOSIS — J301 Allergic rhinitis due to pollen: Secondary | ICD-10-CM | POA: Diagnosis not present

## 2019-07-21 DIAGNOSIS — J301 Allergic rhinitis due to pollen: Secondary | ICD-10-CM | POA: Diagnosis not present

## 2019-07-21 DIAGNOSIS — J3089 Other allergic rhinitis: Secondary | ICD-10-CM | POA: Diagnosis not present

## 2019-07-28 DIAGNOSIS — J3089 Other allergic rhinitis: Secondary | ICD-10-CM | POA: Diagnosis not present

## 2019-07-28 DIAGNOSIS — J301 Allergic rhinitis due to pollen: Secondary | ICD-10-CM | POA: Diagnosis not present

## 2019-07-30 ENCOUNTER — Other Ambulatory Visit: Payer: Self-pay | Admitting: Cardiology

## 2019-07-30 DIAGNOSIS — I6523 Occlusion and stenosis of bilateral carotid arteries: Secondary | ICD-10-CM

## 2019-08-03 ENCOUNTER — Other Ambulatory Visit: Payer: Self-pay | Admitting: Family Medicine

## 2019-08-05 DIAGNOSIS — M25551 Pain in right hip: Secondary | ICD-10-CM | POA: Diagnosis not present

## 2019-08-05 DIAGNOSIS — M7061 Trochanteric bursitis, right hip: Secondary | ICD-10-CM | POA: Diagnosis not present

## 2019-08-05 DIAGNOSIS — M1711 Unilateral primary osteoarthritis, right knee: Secondary | ICD-10-CM | POA: Diagnosis not present

## 2019-08-05 DIAGNOSIS — M25851 Other specified joint disorders, right hip: Secondary | ICD-10-CM | POA: Diagnosis not present

## 2019-08-05 DIAGNOSIS — M25561 Pain in right knee: Secondary | ICD-10-CM | POA: Diagnosis not present

## 2019-08-06 DIAGNOSIS — J3089 Other allergic rhinitis: Secondary | ICD-10-CM | POA: Diagnosis not present

## 2019-08-14 DIAGNOSIS — M1711 Unilateral primary osteoarthritis, right knee: Secondary | ICD-10-CM | POA: Diagnosis not present

## 2019-08-14 DIAGNOSIS — J301 Allergic rhinitis due to pollen: Secondary | ICD-10-CM | POA: Diagnosis not present

## 2019-08-14 DIAGNOSIS — J3089 Other allergic rhinitis: Secondary | ICD-10-CM | POA: Diagnosis not present

## 2019-08-15 ENCOUNTER — Other Ambulatory Visit: Payer: Self-pay | Admitting: Cardiology

## 2019-08-15 DIAGNOSIS — I6523 Occlusion and stenosis of bilateral carotid arteries: Secondary | ICD-10-CM

## 2019-08-17 ENCOUNTER — Other Ambulatory Visit: Payer: Self-pay

## 2019-08-17 DIAGNOSIS — I6523 Occlusion and stenosis of bilateral carotid arteries: Secondary | ICD-10-CM

## 2019-08-17 MED ORDER — SPIRONOLACTONE 25 MG PO TABS: 25.0000 mg | ORAL_TABLET | Freq: Once | ORAL | 1 refills | Status: DC

## 2019-09-01 DIAGNOSIS — J301 Allergic rhinitis due to pollen: Secondary | ICD-10-CM | POA: Diagnosis not present

## 2019-09-01 DIAGNOSIS — J3089 Other allergic rhinitis: Secondary | ICD-10-CM | POA: Diagnosis not present

## 2019-09-02 DIAGNOSIS — G4730 Sleep apnea, unspecified: Secondary | ICD-10-CM | POA: Diagnosis not present

## 2019-09-04 DIAGNOSIS — M7061 Trochanteric bursitis, right hip: Secondary | ICD-10-CM | POA: Diagnosis not present

## 2019-09-04 DIAGNOSIS — M19012 Primary osteoarthritis, left shoulder: Secondary | ICD-10-CM | POA: Diagnosis not present

## 2019-09-04 DIAGNOSIS — M7582 Other shoulder lesions, left shoulder: Secondary | ICD-10-CM | POA: Diagnosis not present

## 2019-09-04 DIAGNOSIS — M7522 Bicipital tendinitis, left shoulder: Secondary | ICD-10-CM | POA: Diagnosis not present

## 2019-09-04 DIAGNOSIS — M25512 Pain in left shoulder: Secondary | ICD-10-CM | POA: Diagnosis not present

## 2019-09-07 ENCOUNTER — Other Ambulatory Visit: Payer: BC Managed Care – PPO

## 2019-09-07 ENCOUNTER — Ambulatory Visit: Payer: BC Managed Care – PPO

## 2019-09-07 ENCOUNTER — Other Ambulatory Visit: Payer: Self-pay

## 2019-09-07 DIAGNOSIS — I6523 Occlusion and stenosis of bilateral carotid arteries: Secondary | ICD-10-CM

## 2019-09-11 DIAGNOSIS — J3081 Allergic rhinitis due to animal (cat) (dog) hair and dander: Secondary | ICD-10-CM | POA: Diagnosis not present

## 2019-09-11 DIAGNOSIS — J301 Allergic rhinitis due to pollen: Secondary | ICD-10-CM | POA: Diagnosis not present

## 2019-09-11 DIAGNOSIS — J3089 Other allergic rhinitis: Secondary | ICD-10-CM | POA: Diagnosis not present

## 2019-09-13 ENCOUNTER — Other Ambulatory Visit: Payer: Self-pay | Admitting: Cardiology

## 2019-09-13 DIAGNOSIS — I6523 Occlusion and stenosis of bilateral carotid arteries: Secondary | ICD-10-CM

## 2019-09-15 DIAGNOSIS — J301 Allergic rhinitis due to pollen: Secondary | ICD-10-CM | POA: Diagnosis not present

## 2019-09-15 DIAGNOSIS — M1611 Unilateral primary osteoarthritis, right hip: Secondary | ICD-10-CM | POA: Diagnosis not present

## 2019-09-15 DIAGNOSIS — M25851 Other specified joint disorders, right hip: Secondary | ICD-10-CM | POA: Diagnosis not present

## 2019-09-15 DIAGNOSIS — J3089 Other allergic rhinitis: Secondary | ICD-10-CM | POA: Diagnosis not present

## 2019-09-15 DIAGNOSIS — M25551 Pain in right hip: Secondary | ICD-10-CM | POA: Diagnosis not present

## 2019-09-15 NOTE — Progress Notes (Signed)
Called patient, NA, LMAM to callback for her CAD results.

## 2019-09-16 ENCOUNTER — Ambulatory Visit: Payer: BC Managed Care – PPO | Admitting: Cardiology

## 2019-09-18 NOTE — Progress Notes (Signed)
2nd attempt: Patient called left a VM requesting results. I called patient again, Na, LMAM to call back for results.

## 2019-09-19 ENCOUNTER — Other Ambulatory Visit: Payer: Self-pay | Admitting: Family Medicine

## 2019-09-23 DIAGNOSIS — J301 Allergic rhinitis due to pollen: Secondary | ICD-10-CM | POA: Diagnosis not present

## 2019-09-23 DIAGNOSIS — J3089 Other allergic rhinitis: Secondary | ICD-10-CM | POA: Diagnosis not present

## 2019-09-24 DIAGNOSIS — J301 Allergic rhinitis due to pollen: Secondary | ICD-10-CM | POA: Diagnosis not present

## 2019-09-24 DIAGNOSIS — J3089 Other allergic rhinitis: Secondary | ICD-10-CM | POA: Diagnosis not present

## 2019-09-24 NOTE — Progress Notes (Signed)
I,April Miller,acting as a scribe for Wilhemena Durie, MD.,have documented all relevant documentation on the behalf of Wilhemena Durie, MD,as directed by  Wilhemena Durie, MD while in the presence of Wilhemena Durie, MD.   Complete physical exam   Patient: Cindy Hester   DOB: 1945-06-03   74 y.o. Female  MRN: [id number] Visit Date: 09/28/2019  Today's healthcare provider: Wilhemena Durie, MD   Chief Complaint  Patient presents with  . Annual Exam   Subjective    KANAE Hester is a 74 y.o. female who presents today for a complete physical exam.  She reports consuming a general diet. The patient does not participate in regular exercise at present. She generally feels fairly well. She reports sleeping fairly well. She does not have additional problems to discuss today.  HPI  She has a slight has a list of things she wants to review outside of her physical.  Most of these have to do with the care of other physicians for her specific medical problems.  No specific issues she does want to go over today is 1 of mouth sores under certain conditions.  She has had to complete GI work-up for this.  They both found nothing.  She is having no weight loss no reflux or chest pain.  She is describing some occasional stomatitis.  She states when this happens she has white spots on her tongue.  They go away within a couple to few days.  Past Medical History:  Diagnosis Date  . Arthritis    right shoulder blade, left thumb  . Diabetes mellitus without complication ([place])   . GERD (gastroesophageal reflux disease)   . Heart murmur    followed by PCP  . High cholesterol   . History of esophagitis   . History of gastritis   . HLD (hyperlipidemia)   . Hypertension   . Sleep apnea    CPAP   Past Surgical History:  Procedure Laterality Date  . BREAST CYST EXCISION Left    removed two times  . CESAREAN SECTION  1986  . CYST EXCISION     from left breast  .  ESOPHAGOGASTRODUODENOSCOPY (EGD) WITH PROPOFOL N/A 12/12/2015   gastritis, LA Grade A reflux esophagitis ESOPHAGOGASTRODUODENOSCOPY (EGD) WITH PROPOFOL;  Surgeon: Manya Silvas, MD;  Location: [place];  Service: Endoscopy;  Laterality: N/A;  Diabetic  . HAMMER TOE SURGERY Bilateral 05/30/2016   Procedure: HAMMER TOE CORRECTION RIGHT 2ND, 3RD, 4TH, 5TH TOES LEFT 5TH TOE;  Surgeon: Samara Deist, DPM;  Location: [place];  Service: Podiatry;  Laterality: Bilateral;  . HEEL SPUR SURGERY Right 2011  . NASAL SINUS SURGERY    . TONSILLECTOMY     Social History   Socioeconomic History  . Marital status: Single    Spouse name: Not on file  . Number of children: 1  . Years of education: master's  . Highest education level: Not on file  Occupational History  . Occupation: Training and development officer: [employer]  Tobacco Use  . Smoking status: Never Smoker  . Smokeless tobacco: Never Used  Vaping Use  . Vaping Use: Never used  Substance and Sexual Activity  . Alcohol use: Yes    Comment: occ  . Drug use: No  . Sexual activity: Not on file  Other Topics Concern  . Not on file  Social History Narrative  . Not on file   Social  Determinants of Health   Financial Resource Strain:   . Difficulty of Paying Living Expenses: Not on file  Food Insecurity:   . Worried About Charity fundraiser in the Last Year: Not on file  . Ran Out of Food in the Last Year: Not on file  Transportation Needs:   . Lack of Transportation (Medical): Not on file  . Lack of Transportation (Non-Medical): Not on file  Physical Activity:   . Days of Exercise per Week: Not on file  . Minutes of Exercise per Session: Not on file  Stress:   . Feeling of Stress : Not on file  Social Connections:   . Frequency of Communication with Friends and Family: Not on file  . Frequency of Social Gatherings with Friends and Family: Not on file  . Attends Religious Services: Not on file  .  Active Member of Clubs or Organizations: Not on file  . Attends Archivist Meetings: Not on file  . Marital Status: Not on file  Intimate Partner Violence:   . Fear of Current or Ex-Partner: Not on file  . Emotionally Abused: Not on file  . Physically Abused: Not on file  . Sexually Abused: Not on file   Family Status  Relation Name Status  . Mother  Deceased at age 44  . Father  Deceased at age 31  . Brother  Alive  . Ethlyn Daniels  Deceased  . Ethlyn Daniels  Deceased  . MGM  Deceased  . MGF  Deceased  . PGM  Deceased  . PGF  Deceased  . Brother  Alive  . Neg Hx  (Not Specified)   Family History  Problem Relation Age of Onset  . Stroke Mother   . Diabetes Mother   . Hypertension Mother   . Congestive Heart Failure Father   . Hypertension Father   . CAD Father   . Glaucoma Father   . Hyperlipidemia Brother   . Breast cancer Paternal Aunt   . Breast cancer Paternal Aunt   . Diabetes Brother   . Stomach cancer Neg Hx   . Colon cancer Neg Hx    Allergies  Allergen Reactions  . Tape     Most tapes cause redness.  Paper tape and tegaderm are OK.    Patient Care Team: Jerrol Banana., MD as PCP - General (Family Medicine)   Medications: Outpatient Medications Prior to Visit  Medication Sig  . amLODipine (NORVASC) 10 MG tablet TAKE 1 TABLET(10 MG) BY MOUTH DAILY  . aspirin 81 MG tablet Take 1 tablet (81 mg total) daily by mouth.  Marland Kitchen BIOTIN 5000 PO Take by mouth daily.  . cholecalciferol (VITAMIN D) 1000 UNITS tablet Take 5,000 Units by mouth.  Twice a week   . empagliflozin (JARDIANCE) 25 MG TABS tablet Take 25 mg by mouth daily.  Marland Kitchen EPINEPHrine 0.3 mg/0.3 mL IJ SOAJ injection INJ UTD  . fluticasone (FLONASE) 50 MCG/ACT nasal spray Place into both nostrils as needed for allergies or rhinitis.  . furosemide (LASIX) 40 MG tablet Take 20 mg by mouth as needed.   . Glucosamine-Chondroit-Vit C-Mn (GLUCOSAMINE CHONDR 1500 COMPLX PO) Take 1 tablet by mouth daily.  .  metoprolol (TOPROL-XL) 200 MG 24 hr tablet TAKE 1 TABLET(200 MG) BY MOUTH DAILY  . mometasone (NASONEX) 50 MCG/ACT nasal spray as needed.   . montelukast (SINGULAIR) 10 MG tablet TAKE 1 TABLET BY MOUTH DAILY  . Multiple Vitamin (MULTIVITAMIN) tablet Take 1 tablet  by mouth daily.    Marland Kitchen omeprazole (PRILOSEC) 20 MG capsule Take 20 mg by mouth daily.  . ondansetron (ZOFRAN ODT) 4 MG disintegrating tablet Take 1 tablet (4 mg total) by mouth every 8 (eight) hours as needed for nausea or vomiting.  Marland Kitchen OZEMPIC, 0.25 OR 0.5 MG/DOSE, 2 MG/1.5ML SOPN INJ 0.5 MG [place] ONCE A WEEK  . phentermine (ADIPEX-P) 37.5 MG tablet Take 1 tablet by mouth daily.  . rosuvastatin (CRESTOR) 20 MG tablet Take 1 tablet (20 mg total) by mouth at bedtime.  . valACYclovir (VALTREX) 500 MG tablet TAKE 1 TABLET BY MOUTH TWICE DAILY IF NEEDED  . valsartan-hydrochlorothiazide (DIOVAN-HCT) 160-25 MG tablet TAKE 1 TABLET BY MOUTH EVERY MORNING  . vitamin B-12 (CYANOCOBALAMIN) 1000 MCG tablet Take 1,000 mcg by mouth daily.  . metoCLOPramide (REGLAN) 10 MG tablet Take 10 mg by mouth daily as needed. (Patient not taking: Reported on 09/28/2019)  . spironolactone (ALDACTONE) 25 MG tablet Take 1 tablet (25 mg total) by mouth once for 1 dose.   No facility-administered medications prior to visit.    Review of Systems  HENT: Positive for mouth sores and postnasal drip.   Respiratory: Positive for apnea.   Cardiovascular: Positive for leg swelling.  Musculoskeletal: Positive for arthralgias.  Allergic/Immunologic: Positive for environmental allergies.  All other systems reviewed and are negative.      Objective    BP 121/64 (BP Location: Right Arm, Patient Position: Sitting, Cuff Size: Large)   Pulse 67   Temp 98.6 F (37 C) (Oral)   Resp 16   Ht 5\' 7"  (1.702 m)   Wt 188 lb (85.3 kg)   SpO2 98%   BMI 29.44 kg/m  BP Readings from Last 3 Encounters:  09/28/19 121/64  03/25/19 120/60  09/23/18 112/62   Wt Readings from Last 3  Encounters:  09/28/19 188 lb (85.3 kg)  03/25/19 185 lb (83.9 kg)  09/23/18 176 lb (79.8 kg)      Physical Exam Vitals reviewed.  Constitutional:      Appearance: She is well-developed. She is obese.  HENT:     Head: Normocephalic and atraumatic.     Right Ear: External ear normal.     Left Ear: External ear normal.     Nose: Nose normal.  Eyes:     General: No scleral icterus.    Conjunctiva/sclera: Conjunctivae normal.  Neck:     Thyroid: No thyromegaly.  Cardiovascular:     Rate and Rhythm: Normal rate and regular rhythm.     Heart sounds: Murmur heard.      Comments: Soft 2/6 systolic Murmur Pulmonary:     Effort: Pulmonary effort is normal.     Breath sounds: Normal breath sounds.  Abdominal:     Palpations: Abdomen is soft.  Lymphadenopathy:     Cervical: No cervical adenopathy.  Skin:    General: Skin is warm and dry.     Comments: She has very fair skin with only occasional solar keratosis.  Neurological:     General: No focal deficit present.     Mental Status: She is alert and oriented to person, place, and time.  Psychiatric:        Mood and Affect: Mood normal.        Behavior: Behavior normal.        Thought Content: Thought content normal.        Judgment: Judgment normal.       Last depression screening scores PHQ 2/9 Scores  09/28/2019 09/23/2018 04/30/2017  PHQ - 2 Score 0 0 0  PHQ- 9 Score 1 - 0   Last fall risk screening Fall Risk  09/28/2019  Falls in the past year? 0  Number falls in past yr: 0  Injury with Fall? 0  Follow up Falls evaluation completed   Last Audit-C alcohol use screening Alcohol Use Disorder Test (AUDIT) 09/28/2019  1. How often do you have a drink containing alcohol? 1  2. How many drinks containing alcohol do you have on a typical day when you are drinking? 0  3. How often do you have six or more drinks on one occasion? 0  AUDIT-C Score 1  Alcohol Brief Interventions/Follow-up AUDIT Score <7 follow-up not indicated     A score of 3 or more in women, and 4 or more in men indicates increased risk for alcohol abuse, EXCEPT if all of the points are from question 1   No results found for any visits on 09/28/19.  Assessment & Plan    Routine Health Maintenance and Physical Exam  Exercise Activities and Dietary recommendations Goals   None     Immunization History  Administered Date(s) Administered  . Influenza, High Dose Seasonal PF 10/31/2016, 11/04/2017  . Influenza-Unspecified 10/07/2014, 11/13/2018  . Pneumococcal Conjugate-13 04/13/2014  . Pneumococcal Polysaccharide-23 11/18/2012  . Tdap 07/19/2009  . Zoster 05/26/2009    Health Maintenance  Topic Date Due  . Hepatitis C Screening  Never done  . FOOT EXAM  Never done  . COVID-19 Vaccine (1) Never done  . TETANUS/TDAP  07/20/2019  . HEMOGLOBIN A1C  08/11/2019  . INFLUENZA VACCINE  09/06/2019  . OPHTHALMOLOGY EXAM  11/04/2019  . COLONOSCOPY  01/14/2021  . MAMMOGRAM  06/24/2021  . DEXA SCAN  Completed  . PNA vac Low Risk Adult  Completed    Discussed health benefits of physical activity, and encouraged her to engage in regular exercise appropriate for her age and condition. 1. Annual physical exam  - Hepatitis C antibody - CBC - Comprehensive metabolic panel - Lipid panel - TSH  2. Type 2 diabetes mellitus with diabetic polyneuropathy, with long-term current use of insulin (HCC) Per endocrinology - CBC - Comprehensive metabolic panel - Lipid panel - TSH  3. Adult hypothyroidism  - CBC - Comprehensive metabolic panel - Lipid panel - TSH  4. Essential hypertension, benign Good control - CBC - Comprehensive metabolic panel - Lipid panel - TSH  5. Mixed hyperlipidemia On statin - CBC - Comprehensive metabolic panel - Lipid panel - TSH  6. Bilateral lower extremity edema Lymphedema present - furosemide (LASIX) 40 MG tablet; Take 0.5 tablets (20 mg total) by mouth as needed.  Dispense: 30 tablet; Refill:  2  7. Actinic keratosis Solar keratosis more likely.  Refer to dermatology due to very fair skin - Ambulatory referral to Dermatology  8. Stomatitis Change omeprazole to pantoprazole.  She has had 2 full GI work-up for this and has no other symptoms.  We will have her to keep a food diary. - pantoprazole (PROTONIX) 40 MG tablet; Take 1 tablet (40 mg total) by mouth daily.  Dispense: 90 tablet; Refill: 1  9. Need for hepatitis C screening test  - Hepatitis C antibody  10. Encounter for screening for HIV  - HIV Antibody (routine testing w rflx)    No follow-ups on file.        Otisha Spickler Cranford Mon, MD  [facility] [phone] (phone) [phone] (fax)  [facility]

## 2019-09-28 ENCOUNTER — Other Ambulatory Visit: Payer: Self-pay

## 2019-09-28 ENCOUNTER — Encounter: Payer: Self-pay | Admitting: Family Medicine

## 2019-09-28 ENCOUNTER — Ambulatory Visit (INDEPENDENT_AMBULATORY_CARE_PROVIDER_SITE_OTHER): Payer: BC Managed Care – PPO | Admitting: Family Medicine

## 2019-09-28 VITALS — BP 121/64 | HR 67 | Temp 98.6°F | Resp 16 | Ht 67.0 in | Wt 188.0 lb

## 2019-09-28 DIAGNOSIS — I10 Essential (primary) hypertension: Secondary | ICD-10-CM

## 2019-09-28 DIAGNOSIS — E782 Mixed hyperlipidemia: Secondary | ICD-10-CM | POA: Diagnosis not present

## 2019-09-28 DIAGNOSIS — E1142 Type 2 diabetes mellitus with diabetic polyneuropathy: Secondary | ICD-10-CM

## 2019-09-28 DIAGNOSIS — G4733 Obstructive sleep apnea (adult) (pediatric): Secondary | ICD-10-CM

## 2019-09-28 DIAGNOSIS — Z1159 Encounter for screening for other viral diseases: Secondary | ICD-10-CM

## 2019-09-28 DIAGNOSIS — Z794 Long term (current) use of insulin: Secondary | ICD-10-CM

## 2019-09-28 DIAGNOSIS — Z0000 Encounter for general adult medical examination without abnormal findings: Secondary | ICD-10-CM | POA: Diagnosis not present

## 2019-09-28 DIAGNOSIS — L570 Actinic keratosis: Secondary | ICD-10-CM

## 2019-09-28 DIAGNOSIS — R600 Localized edema: Secondary | ICD-10-CM

## 2019-09-28 DIAGNOSIS — E039 Hypothyroidism, unspecified: Secondary | ICD-10-CM

## 2019-09-28 DIAGNOSIS — Z114 Encounter for screening for human immunodeficiency virus [HIV]: Secondary | ICD-10-CM

## 2019-09-28 DIAGNOSIS — K121 Other forms of stomatitis: Secondary | ICD-10-CM

## 2019-09-28 MED ORDER — PANTOPRAZOLE SODIUM 40 MG PO TBEC: 40.0000 mg | DELAYED_RELEASE_TABLET | Freq: Every day | ORAL | 1 refills | Status: DC

## 2019-09-28 MED ORDER — FUROSEMIDE 40 MG PO TABS: 20.0000 mg | ORAL_TABLET | ORAL | 2 refills | Status: DC | PRN

## 2019-09-29 LAB — CBC
Hematocrit: 41.2 % (ref 34.0–46.6)
Hemoglobin: 13.4 g/dL (ref 11.1–15.9)
MCH: 29.1 pg (ref 26.6–33.0)
MCHC: 32.5 g/dL (ref 31.5–35.7)
MCV: 89 fL (ref 79–97)
Platelets: 243 10*3/uL (ref 150–450)
RBC: 4.61 x10E6/uL (ref 3.77–5.28)
RDW: 13.5 % (ref 11.7–15.4)
WBC: 8 10*3/uL (ref 3.4–10.8)

## 2019-09-29 LAB — COMPREHENSIVE METABOLIC PANEL
ALT: 24 IU/L (ref 0–32)
AST: 15 IU/L (ref 0–40)
Albumin/Globulin Ratio: 2.6 — ABNORMAL HIGH (ref 1.2–2.2)
Albumin: 4.4 g/dL (ref 3.7–4.7)
Alkaline Phosphatase: 56 IU/L (ref 48–121)
BUN/Creatinine Ratio: 27 (ref 12–28)
BUN: 22 mg/dL (ref 8–27)
Bilirubin Total: 0.3 mg/dL (ref 0.0–1.2)
CO2: 23 mmol/L (ref 20–29)
Calcium: 9.4 mg/dL (ref 8.7–10.3)
Chloride: 101 mmol/L (ref 96–106)
Creatinine, Ser: 0.81 mg/dL (ref 0.57–1.00)
GFR calc Af Amer: 84 mL/min/{1.73_m2} (ref >59)
GFR calc non Af Amer: 73 mL/min/{1.73_m2} (ref >59)
Globulin, Total: 1.7 g/dL (ref 1.5–4.5)
Glucose: 112 mg/dL — ABNORMAL HIGH (ref 65–99)
Potassium: 4.4 mmol/L (ref 3.5–5.2)
Sodium: 139 mmol/L (ref 134–144)
Total Protein: 6.1 g/dL (ref 6.0–8.5)

## 2019-09-29 LAB — LIPID PANEL
Chol/HDL Ratio: 2.5 ratio (ref 0.0–4.4)
Cholesterol, Total: 171 mg/dL (ref 100–199)
HDL: 68 mg/dL (ref >39)
LDL Chol Calc (NIH): 84 mg/dL (ref 0–99)
Triglycerides: 110 mg/dL (ref 0–149)
VLDL Cholesterol Cal: 19 mg/dL (ref 5–40)

## 2019-09-29 LAB — HIV ANTIBODY (ROUTINE TESTING W REFLEX): HIV Screen 4th Generation wRfx: NONREACTIVE

## 2019-09-29 LAB — TSH: TSH: 1.33 u[IU]/mL (ref 0.450–4.500)

## 2019-09-29 LAB — HEPATITIS C ANTIBODY: Hep C Virus Ab: <0.1 s/co ratio (ref 0.0–0.9)

## 2019-10-02 DIAGNOSIS — J301 Allergic rhinitis due to pollen: Secondary | ICD-10-CM | POA: Diagnosis not present

## 2019-10-02 DIAGNOSIS — J3089 Other allergic rhinitis: Secondary | ICD-10-CM | POA: Diagnosis not present

## 2019-10-06 DIAGNOSIS — J301 Allergic rhinitis due to pollen: Secondary | ICD-10-CM | POA: Diagnosis not present

## 2019-10-06 DIAGNOSIS — J3089 Other allergic rhinitis: Secondary | ICD-10-CM | POA: Diagnosis not present

## 2019-10-07 DIAGNOSIS — M1611 Unilateral primary osteoarthritis, right hip: Secondary | ICD-10-CM | POA: Diagnosis not present

## 2019-10-07 DIAGNOSIS — M1711 Unilateral primary osteoarthritis, right knee: Secondary | ICD-10-CM | POA: Diagnosis not present

## 2019-10-07 DIAGNOSIS — M7582 Other shoulder lesions, left shoulder: Secondary | ICD-10-CM | POA: Diagnosis not present

## 2019-10-07 DIAGNOSIS — M7522 Bicipital tendinitis, left shoulder: Secondary | ICD-10-CM | POA: Diagnosis not present

## 2019-10-13 DIAGNOSIS — J3089 Other allergic rhinitis: Secondary | ICD-10-CM | POA: Diagnosis not present

## 2019-10-13 DIAGNOSIS — J301 Allergic rhinitis due to pollen: Secondary | ICD-10-CM | POA: Diagnosis not present

## 2019-10-21 DIAGNOSIS — E1159 Type 2 diabetes mellitus with other circulatory complications: Secondary | ICD-10-CM | POA: Diagnosis not present

## 2019-10-21 DIAGNOSIS — E559 Vitamin D deficiency, unspecified: Secondary | ICD-10-CM | POA: Diagnosis not present

## 2019-10-21 DIAGNOSIS — E1169 Type 2 diabetes mellitus with other specified complication: Secondary | ICD-10-CM | POA: Diagnosis not present

## 2019-10-21 DIAGNOSIS — E785 Hyperlipidemia, unspecified: Secondary | ICD-10-CM | POA: Diagnosis not present

## 2019-10-27 DIAGNOSIS — G4730 Sleep apnea, unspecified: Secondary | ICD-10-CM | POA: Diagnosis not present

## 2019-10-27 DIAGNOSIS — J3089 Other allergic rhinitis: Secondary | ICD-10-CM | POA: Diagnosis not present

## 2019-10-27 DIAGNOSIS — J301 Allergic rhinitis due to pollen: Secondary | ICD-10-CM | POA: Diagnosis not present

## 2019-10-27 NOTE — Progress Notes (Signed)
Primary Physician/Referring:  Jerrol Banana., MD  Patient ID: Cindy Hester, female    DOB: 01-19-1946, 75 y.o.   MRN: [id number]  Chief Complaint  Patient presents with  . Carotid    stenosis and 1 year f/u  . Hypertension   HPI:    HPI: Cindy Hester  is a 74 y.o. Caucasian female patient with type 2 diabetes, hypertension, mild hyperlipidemia, chronic venous insufficiency, lymphedema, obstructive sleep apnea on CPAP and compliant, asymptomatic mild to moderate bilateral carotid artery stenosis presents here for annual visit.    Except for chronic mild dyspnea, she has no specific symptoms today.  Past Medical History:  Diagnosis Date  . Arthritis    right shoulder blade, left thumb  . Diabetes mellitus without complication ([place])   . GERD (gastroesophageal reflux disease)   . Heart murmur    followed by PCP  . High cholesterol   . History of esophagitis   . History of gastritis   . HLD (hyperlipidemia)   . Hypertension   . Sleep apnea    CPAP   Past Surgical History:  Procedure Laterality Date  . BREAST CYST EXCISION Left    removed two times  . CESAREAN SECTION  1986  . CYST EXCISION     from left breast  . ESOPHAGOGASTRODUODENOSCOPY (EGD) WITH PROPOFOL N/A 12/12/2015   gastritis, LA Grade A reflux esophagitis ESOPHAGOGASTRODUODENOSCOPY (EGD) WITH PROPOFOL;  Surgeon: Manya Silvas, MD;  Location: [facility];  Service: Endoscopy;  Laterality: N/A;  Diabetic  . HAMMER TOE SURGERY Bilateral 05/30/2016   Procedure: HAMMER TOE CORRECTION RIGHT 2ND, 3RD, 4TH, 5TH TOES LEFT 5TH TOE;  Surgeon: Samara Deist, DPM;  Location: [facility];  Service: Podiatry;  Laterality: Bilateral;  . HEEL SPUR SURGERY Right 2011  . NASAL SINUS SURGERY    . TONSILLECTOMY     Social History   Tobacco Use  . Smoking status: Never Smoker  . Smokeless tobacco: Never Used  Substance Use Topics  . Alcohol use: Yes    Comment: occ   Marital Status: Single  ROS    Review of Systems  Cardiovascular: Positive for dyspnea on exertion and leg swelling (occasional). Negative for chest pain.  Gastrointestinal: Negative for melena.   Objective  Blood pressure (!) 131/56, pulse 77, resp. rate 15, height _0  (1.702 m), weight 190 lb (86.2 kg), SpO2 100 %. Body mass index is 29.76 kg/m.  Vitals with BMI 10/28/2019 09/28/2019 03/25/2019  Height _1  _2  -  Weight 190 lbs 188 lbs 185 lbs  BMI 24.46 28.63 -  Systolic 817 711 657  Diastolic 56 64 60  Pulse 77 67 72     Physical Exam Constitutional:      Appearance: She is obese.  HENT:     Head: Atraumatic.  Cardiovascular:     Rate and Rhythm: Normal rate and regular rhythm.     Pulses: Normal pulses and intact distal pulses.          Carotid pulses are on the right side with bruit and on the left side with bruit.    Heart sounds: S1 normal and S2 normal. Murmur heard.  Midsystolic murmur is present with a grade of 2/6 at the upper right sternal border and apex.  No gallop.      Comments: No edema. No JVD.  Pulmonary:     Effort: Pulmonary effort is normal.     Breath sounds: Normal breath sounds.  Abdominal:     General: Bowel sounds are normal.     Palpations: Abdomen is soft.    Radiology: No results found.  Laboratory examination:   CMP Latest Ref Rng & Units 09/28/2019 09/23/2018 11/04/2017  Glucose 65 - 99 mg/dL 112(H) 100(H) 148(H)  BUN 8 - 27 mg/dL _0 Creatinine 0.57 - 1.00 mg/dL 0.81 0.94 0.73  Sodium 134 - 144 mmol/L 139 140 139  Potassium 3.5 - 5.2 mmol/L 4.4 4.9 4.7  Chloride 96 - 106 mmol/L 101 100 97  CO2 20 - 29 mmol/L _1 Calcium 8.7 - 10.3 mg/dL 9.4 10.0 10.1  Total Protein 6.0 - 8.5 g/dL 6.1 6.3 6.5  Total Bilirubin 0.0 - 1.2 mg/dL 0.3 0.3 0.4  Alkaline Phos 48 - 121 IU/L 56 71 72  AST 0 - 40 IU/L _2 ALT 0 - 32 IU/L _3 CBC Latest Ref Rng & Units 09/28/2019 09/23/2018 11/04/2017  WBC 3.4 - 10.8 x10E3/uL 8.0 8.1 7.7  Hemoglobin 11.1 - 15.9  g/dL 13.4 13.2 14.7  Hematocrit 34.0 - 46.6 % 41.2 41.1 43.9  Platelets 150 - 450 x10E3/uL 243 331 315    Lipid Panel Recent Labs    09/28/19 1006  CHOL 171  TRIG 110  LDLCALC 84  HDL 68  CHOLHDL 2.5    HEMOGLOBIN A1C Lab Results  Component Value Date   HGBA1C 7.1 02/11/2019   TSH Recent Labs    09/28/19 1006  TSH 1.330    External Labs:  02/21/2018: Glucose 135, creatinine 0.73, EGFR 84/96, potassium 4.8, BMP otherwise normal.  12/13/2017: Glucose 137, creatinine 0.76, EGFR 80/92, potassium 5.2, BMP otherwise normal.  11/04/2017: TSH 1.16.  Cholesterol 146, triglycerides 205, HDL 54, LDL 51.  Glucose 148, creatinine 0.73, EGFR 84/96, potassium 4.7, CMP otherwise normal.  CBC normal.  Medications   Current Outpatient Medications on File Prior to Visit  Medication Sig Dispense Refill  . acetaminophen (TYLENOL) 325 MG tablet Take 325 mg by mouth every 6 (six) hours as needed.    Marland Kitchen amLODipine (NORVASC) 10 MG tablet TAKE 1 TABLET(10 MG) BY MOUTH DAILY 90 tablet 3  . aspirin 81 MG tablet Take 1 tablet (81 mg total) daily by mouth. 30 tablet   . BIOTIN 5000 PO Take by mouth daily.    . cholecalciferol (VITAMIN D) 1000 UNITS tablet Take 5,000 Units by mouth.  Twice a week     . empagliflozin (JARDIANCE) 25 MG TABS tablet Take 25 mg by mouth daily.    Marland Kitchen EPINEPHrine 0.3 mg/0.3 mL IJ SOAJ injection INJ UTD  1  . fluticasone (FLONASE) 50 MCG/ACT nasal spray Place into both nostrils as needed for allergies or rhinitis.    . furosemide (LASIX) 40 MG tablet Take 0.5 tablets (20 mg total) by mouth as needed. 30 tablet 2  . Glucosamine-Chondroit-Vit C-Mn (GLUCOSAMINE CHONDR 1500 COMPLX PO) Take 1 tablet by mouth daily.    . metoprolol (TOPROL-XL) 200 MG 24 hr tablet TAKE 1 TABLET(200 MG) BY MOUTH DAILY 90 tablet 0  . mometasone (NASONEX) 50 MCG/ACT nasal spray as needed.   0  . montelukast (SINGULAIR) 10 MG tablet TAKE 1 TABLET BY MOUTH DAILY 90 tablet 3  . Multiple Vitamin  (MULTIVITAMIN) tablet Take 1 tablet by mouth daily.      . ondansetron (ZOFRAN ODT) 4 MG disintegrating tablet Take 1 tablet (4 mg total) by mouth every 8 (eight) hours as needed for  nausea or vomiting. 90 tablet 1  . OZEMPIC, 0.25 OR 0.5 MG/DOSE, 2 MG/1.5ML SOPN INJ 0.5 MG [place] ONCE A WEEK    . pantoprazole (PROTONIX) 40 MG tablet Take 1 tablet (40 mg total) by mouth daily. 90 tablet 1  . phentermine (ADIPEX-P) 37.5 MG tablet Take 1 tablet by mouth daily.    . rosuvastatin (CRESTOR) 20 MG tablet Take 1 tablet (20 mg total) by mouth at bedtime. 90 tablet 3  . valACYclovir (VALTREX) 500 MG tablet TAKE 1 TABLET BY MOUTH TWICE DAILY IF NEEDED 60 tablet 3  . valsartan-hydrochlorothiazide (DIOVAN-HCT) 160-25 MG tablet TAKE 1 TABLET BY MOUTH EVERY MORNING 90 tablet 1  . vitamin B-12 (CYANOCOBALAMIN) 1000 MCG tablet Take 1,000 mcg by mouth daily.    . metoCLOPramide (REGLAN) 10 MG tablet Take 10 mg by mouth daily as needed. (Patient not taking: Reported on 09/28/2019)    . omeprazole (PRILOSEC) 20 MG capsule Take 20 mg by mouth daily. (Patient not taking: Reported on 10/28/2019)    . spironolactone (ALDACTONE) 25 MG tablet Take 1 tablet (25 mg total) by mouth once for 1 dose. 90 tablet 1   No current facility-administered medications on file prior to visit.    Cardiac Studies:   Lexiscan Sestamibi stress test 01/01/2018: 1. Lexiscan stress test with low level exercise was performed. Patient reached heart rate of 140 bpm, which was 93% of the maximum predicted heart rate. Stress symptoms included dyspnea, nausea, dizziness. Exaggerated blood pressure response seen. Resting hypertension at 166/80 mmHg was seen, with exaggerated response with peak exercise BP reaching 240/70 mmHg. The stress electrocardiogram showed sinus tachycardia, normal stress conduction, no stress arrhythmias and normal stress repolarization. No ischemic changes seen on stress electrocardiogram. 2. The overall quality of the study is  excellent. There is no evidence of abnormal lung activity. SPECT images demonstrate very small area of mild intensity, reversible perfusion defect in inferior apical myocardium. Gated SPECT imaging reveals normal myocardial thickening and wall motion. The left ventricular ejection fraction was normal (68%). 3. Low risk study.  Echocardiogram 12/27/2017: Left ventricle cavity is normal in size. Mild concentric hypertrophy of the left ventricle. Normal global wall motion. Visual EF is 50-55%. Doppler evidence of grade I (impaired) diastolic dysfunction, normal LAP. Mild calcification of the mitral valve annulus. Mild mitral valve leaflet calcification. Trace mitral regurgitation. Trace tricuspid regurgitation. Mild calcification of the TV chordae noted. No evidence of pulmonary hypertension. IVC is normal in size with blunter respiratory response and may indicate elevated central venous pressure  Carotid artery duplex 09/07/2019:  Stenosis in the right internal carotid artery (16-49%). Stenosis in the right external carotid artery (<50%).  Stenosis in the left internal carotid artery (16-49%). Stenosis in the left external carotid artery (<50%).  Antegrade right vertebral artery flow. Antegrade left vertebral artery flow.  No significant change from 12/30/2018. Follow up in one year is appropriate if clinically indicated.  EKG:   EKG 10/28/2019: Normal sinus rhythm at rate of 75 bpm, leftward enlargement, otherwise normal EKG. COMPARED TO EKG 09/15/2018, first-degree AV block not present.   Assessment     ICD-10-CM   1. Bilateral carotid artery stenosis  I65.23 EKG 12-Lead  2. Essential hypertension  I10   3. Mixed hyperlipidemia  E78.2 ezetimibe (ZETIA) 10 MG tablet    Lipid Panel With LDL/HDL Ratio    There are no discontinued medications. Meds ordered this encounter  Medications  . ezetimibe (ZETIA) 10 MG tablet    Sig: Take 1  tablet (10 mg total) by mouth daily after supper.     Dispense:  90 tablet    Refill:  3   Recommendations:   Cindy Hester  is a 74 y.o. Caucasian female patient with type 2 diabetes, hypertension, mild hyperlipidemia, chronic venous insufficiency, lymphedema, obstructive sleep apnea on CPAP and compliant, asymptomatic mild to moderate bilateral carotid artery stenosis presents here for annual visit.    Patient is essentially asymptomatic except for mild chronic stable dyspnea.  I reviewed the carotid artery duplex, moderate disease in bilateral carotids.  I also reviewed external labs, LDL is not at goal in view of carotid stenosis and also diabetic state, I have added Zetia 10 mg in the evening, will recheck lipids in 2 to 3 months.  She will need continued surveillance of carotid artery stenosis.  Her blood pressure was mildly elevated today, however she brings blood pressure recordings from 2 physician visits including her PCP visit recently where the blood pressure was under excellent control.  Hence I did not make any changes to her medications.  I will see her back on annual basis.   Adrian Prows, MD, [facility] 10/28/2019, 10:38 AM Office: [phone]

## 2019-10-28 ENCOUNTER — Other Ambulatory Visit: Payer: Self-pay

## 2019-10-28 ENCOUNTER — Encounter: Payer: Self-pay | Admitting: Cardiology

## 2019-10-28 ENCOUNTER — Ambulatory Visit: Payer: BC Managed Care – PPO | Admitting: Cardiology

## 2019-10-28 VITALS — BP 131/56 | HR 77 | Resp 15 | Ht 67.0 in | Wt 190.0 lb

## 2019-10-28 DIAGNOSIS — E782 Mixed hyperlipidemia: Secondary | ICD-10-CM | POA: Diagnosis not present

## 2019-10-28 DIAGNOSIS — I6523 Occlusion and stenosis of bilateral carotid arteries: Secondary | ICD-10-CM

## 2019-10-28 DIAGNOSIS — I10 Essential (primary) hypertension: Secondary | ICD-10-CM | POA: Diagnosis not present

## 2019-10-28 DIAGNOSIS — G4730 Sleep apnea, unspecified: Secondary | ICD-10-CM | POA: Diagnosis not present

## 2019-10-28 MED ORDER — EZETIMIBE 10 MG PO TABS: 10.0000 mg | ORAL_TABLET | Freq: Every day | ORAL | 3 refills | Status: DC

## 2019-10-28 NOTE — Patient Instructions (Signed)
Get blood work done approximately in 3 months, sometime in the November or early December.

## 2019-10-29 DIAGNOSIS — G4730 Sleep apnea, unspecified: Secondary | ICD-10-CM | POA: Diagnosis not present

## 2019-11-03 DIAGNOSIS — J301 Allergic rhinitis due to pollen: Secondary | ICD-10-CM | POA: Diagnosis not present

## 2019-11-03 DIAGNOSIS — J3089 Other allergic rhinitis: Secondary | ICD-10-CM | POA: Diagnosis not present

## 2019-11-09 DIAGNOSIS — E119 Type 2 diabetes mellitus without complications: Secondary | ICD-10-CM | POA: Diagnosis not present

## 2019-11-09 LAB — HM DIABETES EYE EXAM

## 2019-11-10 DIAGNOSIS — J3089 Other allergic rhinitis: Secondary | ICD-10-CM | POA: Diagnosis not present

## 2019-11-10 DIAGNOSIS — J301 Allergic rhinitis due to pollen: Secondary | ICD-10-CM | POA: Diagnosis not present

## 2019-11-19 ENCOUNTER — Other Ambulatory Visit: Payer: Self-pay | Admitting: Cardiology

## 2019-11-19 DIAGNOSIS — I6523 Occlusion and stenosis of bilateral carotid arteries: Secondary | ICD-10-CM

## 2019-11-20 DIAGNOSIS — J3089 Other allergic rhinitis: Secondary | ICD-10-CM | POA: Diagnosis not present

## 2019-11-20 DIAGNOSIS — J301 Allergic rhinitis due to pollen: Secondary | ICD-10-CM | POA: Diagnosis not present

## 2019-11-26 ENCOUNTER — Other Ambulatory Visit: Payer: Self-pay | Admitting: Cardiology

## 2019-11-26 ENCOUNTER — Other Ambulatory Visit: Payer: Self-pay | Admitting: Family Medicine

## 2019-11-26 DIAGNOSIS — E785 Hyperlipidemia, unspecified: Secondary | ICD-10-CM

## 2019-12-01 DIAGNOSIS — J3089 Other allergic rhinitis: Secondary | ICD-10-CM | POA: Diagnosis not present

## 2019-12-01 DIAGNOSIS — J301 Allergic rhinitis due to pollen: Secondary | ICD-10-CM | POA: Diagnosis not present

## 2019-12-10 DIAGNOSIS — J301 Allergic rhinitis due to pollen: Secondary | ICD-10-CM | POA: Diagnosis not present

## 2019-12-10 DIAGNOSIS — J3089 Other allergic rhinitis: Secondary | ICD-10-CM | POA: Diagnosis not present

## 2019-12-15 DIAGNOSIS — J301 Allergic rhinitis due to pollen: Secondary | ICD-10-CM | POA: Diagnosis not present

## 2019-12-15 DIAGNOSIS — J3089 Other allergic rhinitis: Secondary | ICD-10-CM | POA: Diagnosis not present

## 2019-12-18 ENCOUNTER — Other Ambulatory Visit: Payer: Self-pay | Admitting: Family Medicine

## 2019-12-22 DIAGNOSIS — J301 Allergic rhinitis due to pollen: Secondary | ICD-10-CM | POA: Diagnosis not present

## 2019-12-22 DIAGNOSIS — J3089 Other allergic rhinitis: Secondary | ICD-10-CM | POA: Diagnosis not present

## 2019-12-29 DIAGNOSIS — J3089 Other allergic rhinitis: Secondary | ICD-10-CM | POA: Diagnosis not present

## 2019-12-29 DIAGNOSIS — J301 Allergic rhinitis due to pollen: Secondary | ICD-10-CM | POA: Diagnosis not present

## 2020-01-05 DIAGNOSIS — H1045 Other chronic allergic conjunctivitis: Secondary | ICD-10-CM | POA: Diagnosis not present

## 2020-01-05 DIAGNOSIS — J3081 Allergic rhinitis due to animal (cat) (dog) hair and dander: Secondary | ICD-10-CM | POA: Diagnosis not present

## 2020-01-05 DIAGNOSIS — J301 Allergic rhinitis due to pollen: Secondary | ICD-10-CM | POA: Diagnosis not present

## 2020-01-05 DIAGNOSIS — J3089 Other allergic rhinitis: Secondary | ICD-10-CM | POA: Diagnosis not present

## 2020-01-06 DIAGNOSIS — Z124 Encounter for screening for malignant neoplasm of cervix: Secondary | ICD-10-CM | POA: Diagnosis not present

## 2020-01-06 DIAGNOSIS — Z01419 Encounter for gynecological examination (general) (routine) without abnormal findings: Secondary | ICD-10-CM | POA: Diagnosis not present

## 2020-01-06 DIAGNOSIS — Z1231 Encounter for screening mammogram for malignant neoplasm of breast: Secondary | ICD-10-CM | POA: Diagnosis not present

## 2020-01-12 DIAGNOSIS — J3081 Allergic rhinitis due to animal (cat) (dog) hair and dander: Secondary | ICD-10-CM | POA: Diagnosis not present

## 2020-01-12 DIAGNOSIS — J3089 Other allergic rhinitis: Secondary | ICD-10-CM | POA: Diagnosis not present

## 2020-01-12 DIAGNOSIS — J301 Allergic rhinitis due to pollen: Secondary | ICD-10-CM | POA: Diagnosis not present

## 2020-01-20 ENCOUNTER — Telehealth (INDEPENDENT_AMBULATORY_CARE_PROVIDER_SITE_OTHER): Payer: BC Managed Care – PPO | Admitting: Physician Assistant

## 2020-01-20 DIAGNOSIS — J320 Chronic maxillary sinusitis: Secondary | ICD-10-CM | POA: Diagnosis not present

## 2020-01-20 DIAGNOSIS — J40 Bronchitis, not specified as acute or chronic: Secondary | ICD-10-CM | POA: Diagnosis not present

## 2020-01-20 MED ORDER — DOXYCYCLINE HYCLATE 100 MG PO TABS: 100.0000 mg | ORAL_TABLET | Freq: Two times a day (BID) | ORAL | 0 refills | Status: AC

## 2020-01-20 MED ORDER — PREDNISONE 20 MG PO TABS: 20.0000 mg | ORAL_TABLET | Freq: Every day | ORAL | 0 refills | Status: AC

## 2020-01-20 NOTE — Progress Notes (Signed)
Telephone Visit    Virtual Visit via Video Note   This visit type was conducted due to national recommendations for restrictions regarding the COVID-19 Pandemic (e.g. social distancing) in an effort to limit this patient's exposure and mitigate transmission in our community. This patient is at least at moderate risk for complications without adequate follow up. This format is felt to be most appropriate for this patient at this time. Physical exam was limited by quality of the video and audio technology used for the visit.   Patient location: Home Provider location: Office   I discussed the limitations of evaluation and management by telemedicine and the availability of in person appointments. The patient expressed understanding and agreed to proceed.  Patient: Cindy Hester   DOB: 22-Mar-1945   74 y.o. Female  MRN: [id number] Visit Date: 01/20/2020  Today's healthcare provider: Trinna Post, PA-C   Chief Complaint  Patient presents with  . Cough  I,Lloyd Ayo M Ziere Docken,acting as a scribe for Trinna Post, PA-C.,have documented all relevant documentation on the behalf of Trinna Post, PA-C,as directed by  Trinna Post, PA-C while in the presence of Trinna Post, PA-C.  Subjective    Cough This is a new problem. The current episode started 1 to 4 weeks ago. The problem has been unchanged. The problem occurs constantly. The cough is productive of sputum. Associated symptoms include headaches, nasal congestion, postnasal drip, rhinorrhea, a sore throat and shortness of breath. Pertinent negatives include no chills, ear pain, fever or wheezing. She has tried OTC cough suppressant for the symptoms. The treatment provided mild relief.    Patient reports history of recurrent sinusitis. Reports her symptoms have been > 10 days. COVID test this past Monday was negative.     Medications: Outpatient Medications Prior to Visit  Medication Sig  . acetaminophen (TYLENOL) 325  MG tablet Take 325 mg by mouth every 6 (six) hours as needed.  Marland Kitchen amLODipine (NORVASC) 10 MG tablet TAKE 1 TABLET(10 MG) BY MOUTH DAILY  . aspirin 81 MG tablet Take 1 tablet (81 mg total) daily by mouth.  Marland Kitchen BIOTIN 5000 PO Take by mouth daily.  . cholecalciferol (VITAMIN D) 1000 UNITS tablet Take 5,000 Units by mouth.  Twice a week   . empagliflozin (JARDIANCE) 25 MG TABS tablet Take 25 mg by mouth daily.  Marland Kitchen EPINEPHrine 0.3 mg/0.3 mL IJ SOAJ injection INJ UTD  . ezetimibe (ZETIA) 10 MG tablet Take 1 tablet (10 mg total) by mouth daily after supper.  . fluticasone (FLONASE) 50 MCG/ACT nasal spray Place into both nostrils as needed for allergies or rhinitis.  . furosemide (LASIX) 40 MG tablet Take 0.5 tablets (20 mg total) by mouth as needed.  . Glucosamine-Chondroit-Vit C-Mn (GLUCOSAMINE CHONDR 1500 COMPLX PO) Take 1 tablet by mouth daily.  . metoprolol (TOPROL-XL) 200 MG 24 hr tablet TAKE 1 TABLET(200 MG) BY MOUTH DAILY  . mometasone (NASONEX) 50 MCG/ACT nasal spray as needed.   . montelukast (SINGULAIR) 10 MG tablet TAKE 1 TABLET BY MOUTH DAILY  . Multiple Vitamin (MULTIVITAMIN) tablet Take 1 tablet by mouth daily.  . ondansetron (ZOFRAN ODT) 4 MG disintegrating tablet Take 1 tablet (4 mg total) by mouth every 8 (eight) hours as needed for nausea or vomiting.  Marland Kitchen OZEMPIC, 0.25 OR 0.5 MG/DOSE, 2 MG/1.5ML SOPN INJ 0.5 MG [place] ONCE A WEEK  . pantoprazole (PROTONIX) 40 MG tablet Take 1 tablet (40 mg total) by mouth daily.  . phentermine (ADIPEX-P)  37.5 MG tablet Take 1 tablet by mouth daily.  . rosuvastatin (CRESTOR) 20 MG tablet TAKE 1 TABLET(20 MG) BY MOUTH AT BEDTIME  . spironolactone (ALDACTONE) 25 MG tablet TAKE 1 TABLET(25 MG) BY MOUTH 1 TIME FOR 1 DOSE  . valACYclovir (VALTREX) 500 MG tablet TAKE 1 TABLET BY MOUTH TWICE DAILY IF NEEDED  . valsartan-hydrochlorothiazide (DIOVAN-HCT) 160-25 MG tablet TAKE 1 TABLET BY MOUTH EVERY MORNING  . vitamin B-12 (CYANOCOBALAMIN) 1000 MCG tablet Take 1,000  mcg by mouth daily.   No facility-administered medications prior to visit.    Review of Systems  Constitutional: Negative for chills and fever.  HENT: Positive for congestion, postnasal drip, rhinorrhea and sore throat. Negative for ear pain, sinus pressure and sinus pain.   Respiratory: Positive for cough and shortness of breath. Negative for chest tightness and wheezing.   Neurological: Positive for headaches.      Objective    There were no vitals taken for this visit.   Physical Exam     Assessment & Plan     1. Chronic maxillary sinusitis  - doxycycline (VIBRA-TABS) 100 MG tablet; Take 1 tablet (100 mg total) by mouth 2 (two) times daily for 7 days.  Dispense: 14 tablet; Refill: 0  2. Bronchitis  - doxycycline (VIBRA-TABS) 100 MG tablet; Take 1 tablet (100 mg total) by mouth 2 (two) times daily for 7 days.  Dispense: 14 tablet; Refill: 0 - predniSONE (DELTASONE) 20 MG tablet; Take 1 tablet (20 mg total) by mouth daily with breakfast for 5 days.  Dispense: 5 tablet; Refill: 0   No follow-ups on file.     I discussed the assessment and treatment plan with the patient. The patient was provided an opportunity to ask questions and all were answered. The patient agreed with the plan and demonstrated an understanding of the instructions.   The patient was advised to call back or seek an in-person evaluation if the symptoms worsen or if the condition fails to improve as anticipated.  I provided 21 minutes of non-face-to-face time during this encounter.  ITrinna Post, PA-C, have reviewed all documentation for this visit. The documentation on 01/20/20 for the exam, diagnosis, procedures, and orders are all accurate and complete.  The entirety of the information documented in the History of Present Illness, Review of Systems and Physical Exam were personally obtained by me. Portions of this information were initially documented by [facility] and reviewed by me for  thoroughness and accuracy.    Paulene Floor [facility] [phone] (phone) [phone] (fax)  [place]

## 2020-01-22 DIAGNOSIS — J3089 Other allergic rhinitis: Secondary | ICD-10-CM | POA: Diagnosis not present

## 2020-01-22 DIAGNOSIS — J301 Allergic rhinitis due to pollen: Secondary | ICD-10-CM | POA: Diagnosis not present

## 2020-01-25 ENCOUNTER — Ambulatory Visit: Payer: BC Managed Care – PPO | Admitting: Dermatology

## 2020-01-25 ENCOUNTER — Other Ambulatory Visit: Payer: Self-pay | Admitting: Cardiology

## 2020-01-25 ENCOUNTER — Other Ambulatory Visit: Payer: Self-pay

## 2020-01-25 DIAGNOSIS — L570 Actinic keratosis: Secondary | ICD-10-CM | POA: Diagnosis not present

## 2020-01-25 DIAGNOSIS — L988 Other specified disorders of the skin and subcutaneous tissue: Secondary | ICD-10-CM | POA: Diagnosis not present

## 2020-01-25 DIAGNOSIS — L853 Xerosis cutis: Secondary | ICD-10-CM

## 2020-01-25 DIAGNOSIS — Z1283 Encounter for screening for malignant neoplasm of skin: Secondary | ICD-10-CM | POA: Diagnosis not present

## 2020-01-25 DIAGNOSIS — L578 Other skin changes due to chronic exposure to nonionizing radiation: Secondary | ICD-10-CM

## 2020-01-25 DIAGNOSIS — L820 Inflamed seborrheic keratosis: Secondary | ICD-10-CM | POA: Diagnosis not present

## 2020-01-25 DIAGNOSIS — L821 Other seborrheic keratosis: Secondary | ICD-10-CM

## 2020-01-25 DIAGNOSIS — G4730 Sleep apnea, unspecified: Secondary | ICD-10-CM | POA: Diagnosis not present

## 2020-01-25 DIAGNOSIS — E782 Mixed hyperlipidemia: Secondary | ICD-10-CM | POA: Diagnosis not present

## 2020-01-25 DIAGNOSIS — L609 Nail disorder, unspecified: Secondary | ICD-10-CM

## 2020-01-25 DIAGNOSIS — L649 Androgenic alopecia, unspecified: Secondary | ICD-10-CM | POA: Diagnosis not present

## 2020-01-25 DIAGNOSIS — D229 Melanocytic nevi, unspecified: Secondary | ICD-10-CM

## 2020-01-25 DIAGNOSIS — L814 Other melanin hyperpigmentation: Secondary | ICD-10-CM

## 2020-01-25 DIAGNOSIS — D1800 Hemangioma unspecified site: Secondary | ICD-10-CM

## 2020-01-25 NOTE — Patient Instructions (Addendum)
Dry Skin Care  What causes dry skin?  Dry skin is common and results from inadequate moisture in the outer skin layers. Dry skin usually results from the excessive loss of moisture from the skin surface. This occurs due to two major factors: 1. Normally the skin's oil glands deposit a layer of oil on the skin's surface. This layer of oil prevents the loss of moisture from the skin. Exposure to soaps, cleaners, solvents, and disinfectants removes this oily film, allowing water to escape. 2. Water loss from the skin increases when the humidity is low. During winter months we spend a lot of time indoors where the air is heated. Heated air has very low humidity. This also contributes to dry skin.  A tendency for dry skin may accompany such disorders as eczema. Also, as people age, the number of functioning oil glands decreases, and the tendency toward dry skin can be a sensation of skin tightness when emerging from the shower.  How do I manage dry skin?  1. Humidify your environment. This can be accomplished by using a humidifier in your bedroom at night during winter months. 2. Bathing can actually put moisture back into your skin if done right. Take the following steps while bathing to sooth dry skin:  Avoid hot water, which only dries the skin and makes itching worse. Use warm water.  Avoid washcloths or extensive rubbing or scrubbing.  Use mild soaps like unscented Dove, Oil of Olay, Cetaphil, Basis, or CeraVe.  If you take baths rather than showers, rinse off soap residue with clean water before getting out of tub.  Once out of the shower/tub, pat dry gently with a soft towel. Leave your skin damp.  While still damp, apply any medicated ointment/cream you were prescribed to the affected areas. After you apply your medicated ointment/cream, then apply your moisturizer to your whole body.This is the most important step in dry skin care. If this is omitted, your skin will continue to be  dry.  The choice of moisturizer is also very important. In general, lotion will not provider enough moisture to severely dry skin because it is water based. You should use an ointment or cream. Moisturizers should also be unscented. Good choices include Vaseline (plain petrolatum), Aquaphor, Cetaphil, CeraVe, Vanicream, DML Forte, Aveeno moisture, or Eucerin Cream.  Bath oils can be helpful, but do not replace the application of moisturizer after the bath. In addition, they make the tub slippery causing an increased risk for falls. Therefore, we do not recommend their use.   Recommend Women's Rogaine 5% daily or Minoxydil 5%.  Recommend Biotin 2.5mg  po QD.  Recommend low level light therapy caps or bands.  Recommend Derma Nail solution for the nails.

## 2020-01-25 NOTE — Progress Notes (Addendum)
New Patient Visit  Subjective  Cindy Hester is a 74 y.o. female who presents for the following: Annual Exam. The patient presents for Total-Body Skin Exam (TBSE) for skin cancer screening and mole check. Patient has noticed new lesions on her face and L thigh that she would like checked. She also c/o itching on her back. She would like to discuss facial elastosis and treatment options.  The following portions of the chart were reviewed this encounter and updated as appropriate:   Tobacco  Allergies  Meds  Problems  Med Hx  Surg Hx  Fam Hx     Review of Systems:  No other skin or systemic complaints except as noted in HPI or Assessment and Plan.  Objective  Well appearing patient in no apparent distress; mood and affect are within normal limits.  A full examination was performed including scalp, head, eyes, ears, nose, lips, neck, chest, axillae, abdomen, back, buttocks, bilateral upper extremities, bilateral lower extremities, hands, feet, fingers, toes, fingernails, and toenails. All findings within normal limits unless otherwise noted below.  Objective  R temple/zygoma x 1: Erythematous thin papules/macules with gritty scale.   Images    Objective  L lower eyelid x 1, L lat thigh x 1 (2): Erythematous keratotic or waxy stuck-on papule or plaque. L thigh - 2.0 cm   Objective  Face: Rhytides and volume loss.   Objective  Scalp: Diffuse thinning of the crown and widening of the midline part with retention of the frontal hairline - Reviewed progressive nature and prognosis.   Assessment & Plan  AK (actinic keratosis) R temple/zygoma x 1  Plan biopsy if not improved at follow up appointment in 2 months.  Destruction of lesion - R temple/zygoma x 1 Complexity: simple   Destruction method: cryotherapy   Informed consent: discussed and consent obtained   Timeout:  patient name, date of birth, surgical site, and procedure verified Lesion destroyed using liquid  nitrogen: Yes   Region frozen until ice ball extended beyond lesion: Yes   Outcome: patient tolerated procedure well with no complications   Post-procedure details: wound care instructions given    Inflamed seborrheic keratosis (2) L lower eyelid margin  x 1, L lat thigh x 1  Destruction of lesion - L lower eyelid x 1, L lat thigh x 1 Complexity: simple   Destruction method: cryotherapy   Informed consent: discussed and consent obtained   Timeout:  patient name, date of birth, surgical site, and procedure verified Lesion destroyed using liquid nitrogen: Yes   Region frozen until ice ball extended beyond lesion: Yes   Outcome: patient tolerated procedure well with no complications   Post-procedure details: wound care instructions given    Elastosis of skin Face Discussed hyaluronic acid facial fillers. Recommend Restylane Defyne to the oral commissures.   Androgenic alopecia Scalp Chronic persistent condition. Recommend Rogaine 5% for Women daily,  Biotin 2.5mg  po QD, and  LLL (low level light/red light treatment) caps/hair bands.   Nail problem Right Hand - Anterior Chronic persistent Recommend Nuvail or Derma Nail daily as well as Biotin 2.5mg  po QD.   Skin cancer screening   Lentigines - Scattered tan macules - Discussed due to sun exposure - Benign, observe - Call for any changes  Seborrheic Keratoses - Stuck-on, waxy, tan-brown papules and plaques  - Discussed benign etiology and prognosis. - Observe - Call for any changes  Melanocytic Nevi - Tan-brown and/or pink-flesh-colored symmetric macules and papules - Benign appearing on  exam today - Observation - Call clinic for new or changing moles - Recommend daily use of broad spectrum spf 30+ sunscreen to sun-exposed areas.   Hemangiomas - Red papules - Discussed benign nature - Observe - Call for any changes  Actinic Damage - Chronic, secondary to cumulative UV/sun exposure - diffuse scaly erythematous  macules with underlying dyspigmentation - Recommend daily broad spectrum sunscreen SPF 30+ to sun-exposed areas, reapply every 2 hours as needed.  - Call for new or changing lesions.  Xerosis - diffuse xerotic patches - recommend gentle, hydrating skin care - gentle skin care handout given  Skin cancer screening performed today.  Return in about 2 months (around 03/27/2020) for Ak follow up - recheck R temple/zygoma, possible bx if still present. Schedule for filler of the oral commissures.   Luther Redo, CMA, am acting as scribe for Sarina Ser, MD .  Documentation: I have reviewed the above documentation for accuracy and completeness, and I agree with the above.  Sarina Ser, MD

## 2020-01-26 LAB — LIPID PANEL WITH LDL/HDL RATIO
Cholesterol, Total: 129 mg/dL (ref 100–199)
HDL: 65 mg/dL (ref >39)
LDL Chol Calc (NIH): 46 mg/dL (ref 0–99)
LDL/HDL Ratio: 0.7 ratio (ref 0.0–3.2)
Triglycerides: 99 mg/dL (ref 0–149)
VLDL Cholesterol Cal: 18 mg/dL (ref 5–40)

## 2020-02-01 NOTE — Progress Notes (Signed)
Called patient, NA, LMAM

## 2020-02-01 NOTE — Progress Notes (Signed)
Patient called back, I have discussed results with her.

## 2020-02-02 DIAGNOSIS — J301 Allergic rhinitis due to pollen: Secondary | ICD-10-CM | POA: Diagnosis not present

## 2020-02-02 DIAGNOSIS — J3089 Other allergic rhinitis: Secondary | ICD-10-CM | POA: Diagnosis not present

## 2020-02-03 ENCOUNTER — Encounter: Payer: Self-pay | Admitting: Dermatology

## 2020-02-11 DIAGNOSIS — J3089 Other allergic rhinitis: Secondary | ICD-10-CM | POA: Diagnosis not present

## 2020-02-11 DIAGNOSIS — J301 Allergic rhinitis due to pollen: Secondary | ICD-10-CM | POA: Diagnosis not present

## 2020-02-16 DIAGNOSIS — J3089 Other allergic rhinitis: Secondary | ICD-10-CM | POA: Diagnosis not present

## 2020-02-16 DIAGNOSIS — J301 Allergic rhinitis due to pollen: Secondary | ICD-10-CM | POA: Diagnosis not present

## 2020-02-23 DIAGNOSIS — J3089 Other allergic rhinitis: Secondary | ICD-10-CM | POA: Diagnosis not present

## 2020-02-23 DIAGNOSIS — J301 Allergic rhinitis due to pollen: Secondary | ICD-10-CM | POA: Diagnosis not present

## 2020-03-01 ENCOUNTER — Other Ambulatory Visit: Payer: Self-pay | Admitting: Family Medicine

## 2020-03-03 DIAGNOSIS — J301 Allergic rhinitis due to pollen: Secondary | ICD-10-CM | POA: Diagnosis not present

## 2020-03-03 DIAGNOSIS — J3089 Other allergic rhinitis: Secondary | ICD-10-CM | POA: Diagnosis not present

## 2020-03-08 DIAGNOSIS — J301 Allergic rhinitis due to pollen: Secondary | ICD-10-CM | POA: Diagnosis not present

## 2020-03-08 DIAGNOSIS — J3089 Other allergic rhinitis: Secondary | ICD-10-CM | POA: Diagnosis not present

## 2020-03-14 DIAGNOSIS — J3089 Other allergic rhinitis: Secondary | ICD-10-CM | POA: Diagnosis not present

## 2020-03-14 DIAGNOSIS — J301 Allergic rhinitis due to pollen: Secondary | ICD-10-CM | POA: Diagnosis not present

## 2020-03-15 DIAGNOSIS — J301 Allergic rhinitis due to pollen: Secondary | ICD-10-CM | POA: Diagnosis not present

## 2020-03-15 DIAGNOSIS — J3089 Other allergic rhinitis: Secondary | ICD-10-CM | POA: Diagnosis not present

## 2020-03-17 ENCOUNTER — Other Ambulatory Visit: Payer: Self-pay | Admitting: Family Medicine

## 2020-03-17 NOTE — Telephone Encounter (Signed)
Requested medications are due for refill today yes  Requested medications are on the active medication list yes  Last refill 11/12  Last visit 09/2019  Future visit scheduled 09/2020  Notes to clinic Given 3 month rx in 12/2019, upcoming visit not until 09/2020, please assess.

## 2020-03-22 DIAGNOSIS — J301 Allergic rhinitis due to pollen: Secondary | ICD-10-CM | POA: Diagnosis not present

## 2020-03-22 DIAGNOSIS — J3089 Other allergic rhinitis: Secondary | ICD-10-CM | POA: Diagnosis not present

## 2020-03-23 ENCOUNTER — Other Ambulatory Visit: Payer: Self-pay | Admitting: Family Medicine

## 2020-03-23 DIAGNOSIS — R600 Localized edema: Secondary | ICD-10-CM

## 2020-03-25 ENCOUNTER — Other Ambulatory Visit: Payer: Self-pay | Admitting: Family Medicine

## 2020-03-25 DIAGNOSIS — K121 Other forms of stomatitis: Secondary | ICD-10-CM

## 2020-03-29 DIAGNOSIS — J3089 Other allergic rhinitis: Secondary | ICD-10-CM | POA: Diagnosis not present

## 2020-03-29 DIAGNOSIS — J301 Allergic rhinitis due to pollen: Secondary | ICD-10-CM | POA: Diagnosis not present

## 2020-03-30 ENCOUNTER — Telehealth: Payer: Self-pay

## 2020-03-30 NOTE — Telephone Encounter (Signed)
Advised pharmacy furosemide 1/2 tablet po daily as needed.

## 2020-03-30 NOTE — Telephone Encounter (Signed)
Copied from [facility] [id number]. Topic: General - Other >> Mar 30, 2020  9:24 AM Yvette Rack wrote: Reason for CRM: Kaitlyn with [facility] requests clarification of the frequency for Rx furosemide (LASIX) 40 MG tablet. Cb# [phone]

## 2020-04-07 DIAGNOSIS — J3089 Other allergic rhinitis: Secondary | ICD-10-CM | POA: Diagnosis not present

## 2020-04-07 DIAGNOSIS — J301 Allergic rhinitis due to pollen: Secondary | ICD-10-CM | POA: Diagnosis not present

## 2020-04-12 DIAGNOSIS — J3089 Other allergic rhinitis: Secondary | ICD-10-CM | POA: Diagnosis not present

## 2020-04-12 DIAGNOSIS — J3081 Allergic rhinitis due to animal (cat) (dog) hair and dander: Secondary | ICD-10-CM | POA: Diagnosis not present

## 2020-04-12 DIAGNOSIS — J301 Allergic rhinitis due to pollen: Secondary | ICD-10-CM | POA: Diagnosis not present

## 2020-04-19 DIAGNOSIS — J301 Allergic rhinitis due to pollen: Secondary | ICD-10-CM | POA: Diagnosis not present

## 2020-04-19 DIAGNOSIS — J3089 Other allergic rhinitis: Secondary | ICD-10-CM | POA: Diagnosis not present

## 2020-04-20 DIAGNOSIS — E785 Hyperlipidemia, unspecified: Secondary | ICD-10-CM | POA: Diagnosis not present

## 2020-04-20 DIAGNOSIS — E559 Vitamin D deficiency, unspecified: Secondary | ICD-10-CM | POA: Diagnosis not present

## 2020-04-20 DIAGNOSIS — E1169 Type 2 diabetes mellitus with other specified complication: Secondary | ICD-10-CM | POA: Diagnosis not present

## 2020-04-20 DIAGNOSIS — E1159 Type 2 diabetes mellitus with other circulatory complications: Secondary | ICD-10-CM | POA: Diagnosis not present

## 2020-04-20 DIAGNOSIS — I152 Hypertension secondary to endocrine disorders: Secondary | ICD-10-CM | POA: Diagnosis not present

## 2020-04-22 DIAGNOSIS — G4730 Sleep apnea, unspecified: Secondary | ICD-10-CM | POA: Diagnosis not present

## 2020-04-26 DIAGNOSIS — J3081 Allergic rhinitis due to animal (cat) (dog) hair and dander: Secondary | ICD-10-CM | POA: Diagnosis not present

## 2020-04-26 DIAGNOSIS — J301 Allergic rhinitis due to pollen: Secondary | ICD-10-CM | POA: Diagnosis not present

## 2020-04-26 DIAGNOSIS — J3089 Other allergic rhinitis: Secondary | ICD-10-CM | POA: Diagnosis not present

## 2020-05-04 ENCOUNTER — Other Ambulatory Visit: Payer: Self-pay

## 2020-05-04 ENCOUNTER — Ambulatory Visit (INDEPENDENT_AMBULATORY_CARE_PROVIDER_SITE_OTHER): Payer: BC Managed Care – PPO | Admitting: Dermatology

## 2020-05-04 DIAGNOSIS — L578 Other skin changes due to chronic exposure to nonionizing radiation: Secondary | ICD-10-CM

## 2020-05-04 DIAGNOSIS — L988 Other specified disorders of the skin and subcutaneous tissue: Secondary | ICD-10-CM

## 2020-05-04 DIAGNOSIS — L821 Other seborrheic keratosis: Secondary | ICD-10-CM | POA: Diagnosis not present

## 2020-05-04 DIAGNOSIS — Z872 Personal history of diseases of the skin and subcutaneous tissue: Secondary | ICD-10-CM | POA: Diagnosis not present

## 2020-05-04 DIAGNOSIS — L738 Other specified follicular disorders: Secondary | ICD-10-CM

## 2020-05-04 NOTE — Patient Instructions (Signed)
Recommend Sarna lotion for itching and CeraVe daily moisturizer for daily use.

## 2020-05-04 NOTE — Progress Notes (Signed)
   Follow-Up Visit   Subjective  Cindy Hester is a 75 y.o. 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  The following portions of the chart were reviewed this encounter and updated as appropriate:  Tobacco  Allergies  Meds  Problems  Med Hx  Surg Hx  Fam Hx     Review of Systems: No other skin or systemic complaints except as noted in HPI or Assessment and Plan.   Objective  Well appearing patient in no apparent distress; mood and affect are within normal limits.  A focused examination was performed including. Relevant physical exam findings are noted in the Assessment and Plan.  Objective  nasolabial folds, oral commissure, chin crease: Rhytides and volume loss.   Images                          Assessment & Plan  Elastosis of skin nasolabial folds, oral commissure, chin crease Restylane Defyne 1 cc syringe Lot: [id number] Exp: 06/04/2021  Filling material injection - nasolabial folds, oral commissure, chin crease Prior to the procedure, the patient's past medical history, allergies and the rare but potential risks and complications were reviewed with the patient and a signed consent was obtained. Pre and post-treatment care was discussed and instructions provided.  Location: nasolabial folds and chin  Filler Type: Restylane defyne  Procedure: The area was prepped thoroughly with Puracyn. After injection of the filler, the treated areas were cleansed and iced to reduce swelling. Post-treatment instructions were reviewed with the patient.       Patient tolerated the procedure well. The patient will call with any problems, questions or concerns prior to their next appointment.  History of PreCancerous Actinic Keratosis  - site(s) of PreCancerous Actinic Keratosis clear today. - these may recur and new lesions may form requiring treatment to  prevent transformation into skin cancer - observe for new or changing spots and contact [name] for appointment if occur - photoprotection with sun protective clothing; sunglasses and broad spectrum sunscreen with SPF of at least 30 + and frequent self skin exams recommended - yearly exams by a dermatologist recommended for persons with history of PreCancerous Actinic Keratoses  Seborrheic Keratoses - Stuck-on, waxy, tan-brown papules and/or plaques  - Benign-appearing - Discussed benign etiology and prognosis. - Observe - Call for any changes  Sebaceous Hyperplasia - Small yellow papules with a central dell - Benign - Observe  Actinic Damage - chronic, secondary to cumulative UV radiation exposure/sun exposure over time - diffuse scaly erythematous macules with underlying dyspigmentation - Recommend daily broad spectrum sunscreen SPF 30+ to sun-exposed areas, reapply every 2 hours as needed.  - Recommend staying in the shade or wearing long sleeves, sun glasses (UVA+UVB protection) and wide brim hats (4-inch brim around the entire circumference of the hat). - Call for new or changing lesions.  Return for 4-6 week filler check, 6-12 fillers.   IHarriett Hester, CMA, am acting as scribe for Sarina Ser, MD.  Documentation: I have reviewed the above documentation for accuracy and completeness, and I agree with the above.  Sarina Ser, MD

## 2020-05-05 ENCOUNTER — Encounter: Payer: Self-pay | Admitting: Dermatology

## 2020-05-06 DIAGNOSIS — J3089 Other allergic rhinitis: Secondary | ICD-10-CM | POA: Diagnosis not present

## 2020-05-06 DIAGNOSIS — J301 Allergic rhinitis due to pollen: Secondary | ICD-10-CM | POA: Diagnosis not present

## 2020-05-10 DIAGNOSIS — J301 Allergic rhinitis due to pollen: Secondary | ICD-10-CM | POA: Diagnosis not present

## 2020-05-10 DIAGNOSIS — J3089 Other allergic rhinitis: Secondary | ICD-10-CM | POA: Diagnosis not present

## 2020-05-16 ENCOUNTER — Other Ambulatory Visit: Payer: Self-pay | Admitting: Cardiology

## 2020-05-16 DIAGNOSIS — I6523 Occlusion and stenosis of bilateral carotid arteries: Secondary | ICD-10-CM

## 2020-05-17 DIAGNOSIS — J301 Allergic rhinitis due to pollen: Secondary | ICD-10-CM | POA: Diagnosis not present

## 2020-05-17 DIAGNOSIS — J3089 Other allergic rhinitis: Secondary | ICD-10-CM | POA: Diagnosis not present

## 2020-05-26 DIAGNOSIS — J3089 Other allergic rhinitis: Secondary | ICD-10-CM | POA: Diagnosis not present

## 2020-05-26 DIAGNOSIS — J301 Allergic rhinitis due to pollen: Secondary | ICD-10-CM | POA: Diagnosis not present

## 2020-05-27 ENCOUNTER — Other Ambulatory Visit: Payer: Self-pay | Admitting: Family Medicine

## 2020-05-31 DIAGNOSIS — J301 Allergic rhinitis due to pollen: Secondary | ICD-10-CM | POA: Diagnosis not present

## 2020-05-31 DIAGNOSIS — J3089 Other allergic rhinitis: Secondary | ICD-10-CM | POA: Diagnosis not present

## 2020-06-01 ENCOUNTER — Other Ambulatory Visit: Payer: Self-pay | Admitting: Family Medicine

## 2020-06-06 ENCOUNTER — Other Ambulatory Visit: Payer: Self-pay | Admitting: Family Medicine

## 2020-06-06 DIAGNOSIS — Z1231 Encounter for screening mammogram for malignant neoplasm of breast: Secondary | ICD-10-CM

## 2020-06-09 DIAGNOSIS — J3089 Other allergic rhinitis: Secondary | ICD-10-CM | POA: Diagnosis not present

## 2020-06-09 DIAGNOSIS — J301 Allergic rhinitis due to pollen: Secondary | ICD-10-CM | POA: Diagnosis not present

## 2020-06-14 DIAGNOSIS — J3089 Other allergic rhinitis: Secondary | ICD-10-CM | POA: Diagnosis not present

## 2020-06-14 DIAGNOSIS — J301 Allergic rhinitis due to pollen: Secondary | ICD-10-CM | POA: Diagnosis not present

## 2020-06-23 DIAGNOSIS — J301 Allergic rhinitis due to pollen: Secondary | ICD-10-CM | POA: Diagnosis not present

## 2020-06-23 DIAGNOSIS — J3089 Other allergic rhinitis: Secondary | ICD-10-CM | POA: Diagnosis not present

## 2020-06-25 ENCOUNTER — Other Ambulatory Visit: Payer: Self-pay | Admitting: Family Medicine

## 2020-06-25 DIAGNOSIS — K121 Other forms of stomatitis: Secondary | ICD-10-CM

## 2020-06-29 ENCOUNTER — Other Ambulatory Visit: Payer: Self-pay

## 2020-06-29 ENCOUNTER — Ambulatory Visit (INDEPENDENT_AMBULATORY_CARE_PROVIDER_SITE_OTHER): Payer: BC Managed Care – PPO | Admitting: Dermatology

## 2020-06-29 ENCOUNTER — Ambulatory Visit
Admission: RE | Admit: 2020-06-29 | Discharge: 2020-06-29 | Disposition: A | Discharge status: Home / Self Care | Payer: BC Managed Care – PPO | Source: Ambulatory Visit | Attending: Family Medicine | Admitting: Family Medicine

## 2020-06-29 DIAGNOSIS — L820 Inflamed seborrheic keratosis: Secondary | ICD-10-CM

## 2020-06-29 DIAGNOSIS — L578 Other skin changes due to chronic exposure to nonionizing radiation: Secondary | ICD-10-CM

## 2020-06-29 DIAGNOSIS — L988 Other specified disorders of the skin and subcutaneous tissue: Secondary | ICD-10-CM

## 2020-06-29 DIAGNOSIS — Z1231 Encounter for screening mammogram for malignant neoplasm of breast: Secondary | ICD-10-CM | POA: Diagnosis not present

## 2020-06-29 NOTE — Patient Instructions (Signed)

## 2020-06-29 NOTE — Progress Notes (Signed)
Follow-Up Visit   Subjective  Cindy Hester is a 75 y.o. female who presents for the following: Facial Elastosis (Patient is here today to recheck cosmetic fillers and to have more injected). Patient also has a new lesion in her scalp that she is concerned about - irritated, itchy, picks at.  The following portions of the chart were reviewed this encounter and updated as appropriate:   Tobacco  Allergies  Meds  Problems  Med Hx  Surg Hx  Fam Hx     Review of Systems:  No other skin or systemic complaints except as noted in HPI or Assessment and Plan.  Objective  Well appearing patient in no apparent distress; mood and affect are within normal limits.  A focused examination was performed including the face. Relevant physical exam findings are noted in the Assessment and Plan.  Objective  Face: Rhytides and volume loss.   Images                  Objective  Scalp x 1: Erythematous keratotic or waxy stuck-on papule or plaque.   Assessment & Plan  Elastosis of skin Face Restylane Defyne 71mL injected into: - B/L oral commissures  - B/L nasolabial creases - Ant chin horizontal crease  Discussed Botox - recommend 27.5 units to the frown complex  Recommend Restylane Refyne filler at next visit as well.   Filling material injection - Face Prior to the procedure, the patient's past medical history, allergies and the rare but potential risks and complications were reviewed with the patient and a signed consent was obtained. Pre and post-treatment care was discussed and instructions provided.  Location: nasolabial folds, marionette lines, oral commissures and ant chin  Filler Type: Restylane defyne  Lot Number: [id number]  Expiration date: 10/05/2021  Procedure: The area was prepped thoroughly with Puracyn. After introducing the needle into the desired treatment area, the syringe plunger was drawn back to ensure there was no flash of blood prior to injecting  the filler in order to minimize risk of intravascular injection and vascular occlusion. After injection of the filler, the treated areas were cleansed and iced to reduce swelling. Post-treatment instructions were reviewed with the patient.       Patient tolerated the procedure well. The patient will call with any problems, questions or concerns prior to their next appointment.   Inflamed seborrheic keratosis Scalp x 1 Destruction of lesion - Scalp x 1 Complexity: simple   Destruction method: cryotherapy   Informed consent: discussed and consent obtained   Timeout:  patient name, date of birth, surgical site, and procedure verified Lesion destroyed using liquid nitrogen: Yes   Region frozen until ice ball extended beyond lesion: Yes   Outcome: patient tolerated procedure well with no complications   Post-procedure details: wound care instructions given    Actinic Damage - chronic, secondary to cumulative UV radiation exposure/sun exposure over time - diffuse scaly erythematous macules with underlying dyspigmentation - Recommend daily broad spectrum sunscreen SPF 30+ to sun-exposed areas, reapply every 2 hours as needed.  - Recommend staying in the shade or wearing long sleeves, sun glasses (UVA+UVB protection) and wide brim hats (4-inch brim around the entire circumference of the hat). - Call for new or changing lesions.  Return for cosmetic treatments - 2 weeks for Botox (27.5 units) and Restylane Refyne .  Luther Redo, CMA, am acting as scribe for Sarina Ser, MD .  Documentation: I have reviewed the above documentation for accuracy and  completeness, and I agree with the above.  Sarina Ser, MD

## 2020-07-04 ENCOUNTER — Encounter: Payer: Self-pay | Admitting: Dermatology

## 2020-07-05 DIAGNOSIS — J301 Allergic rhinitis due to pollen: Secondary | ICD-10-CM | POA: Diagnosis not present

## 2020-07-05 DIAGNOSIS — J3089 Other allergic rhinitis: Secondary | ICD-10-CM | POA: Diagnosis not present

## 2020-07-15 ENCOUNTER — Other Ambulatory Visit: Payer: Self-pay

## 2020-07-15 ENCOUNTER — Ambulatory Visit (INDEPENDENT_AMBULATORY_CARE_PROVIDER_SITE_OTHER): Payer: BC Managed Care – PPO | Admitting: Dermatology

## 2020-07-15 DIAGNOSIS — L988 Other specified disorders of the skin and subcutaneous tissue: Secondary | ICD-10-CM

## 2020-07-15 DIAGNOSIS — J301 Allergic rhinitis due to pollen: Secondary | ICD-10-CM | POA: Diagnosis not present

## 2020-07-15 DIAGNOSIS — J3089 Other allergic rhinitis: Secondary | ICD-10-CM | POA: Diagnosis not present

## 2020-07-15 NOTE — Patient Instructions (Signed)

## 2020-07-15 NOTE — Progress Notes (Signed)
   Follow-Up Visit   Subjective  Cindy Hester is a 75 y.o. female who presents for the following: Facial Elastosis (Botox and Filler today).  The following portions of the chart were reviewed this encounter and updated as appropriate:   Tobacco  Allergies  Meds  Problems  Med Hx  Surg Hx  Fam Hx      Review of Systems:  No other skin or systemic complaints except as noted in HPI or Assessment and Plan.  Objective  Well appearing patient in no apparent distress; mood and affect are within normal limits.  A focused examination was performed including face. Relevant physical exam findings are noted in the Assessment and Plan.  Face                      Assessment & Plan  Elastosis of skin Face  Filling material injection - Face Location: See attached image  Informed consent: Discussed risks (infection, pain, bleeding, bruising, swelling, allergic reaction, paralysis of nearby muscles, eyelid droop, double vision, neck weakness, difficulty breathing, headache, undesirable cosmetic result, and need for additional treatment) and benefits of the procedure, as well as the alternatives.  Informed consent was obtained.  Preparation: The area was cleansed with alcohol.  Procedure Details:  Botox was injected into the dermis with a 30-gauge needle. Pressure applied to any bleeding. Ice packs offered for swelling.  Lot Number:  [id number] Expiration:  06/2022  Total Units Injected:  27.5  Plan: Patient was instructed to remain upright for 4 hours. Patient was instructed to avoid massaging the face and avoid vigorous exercise for the rest of the day. Tylenol may be used for headache.  Allow 2 weeks before returning to clinic for additional dosing as needed. Patient will call for any problems.   Filling material injection - Face Prior to the procedure, the patient's past medical history, allergies and the rare but potential risks and complications were reviewed  with the patient and a signed consent was obtained. Pre and post-treatment care was discussed and instructions provided.  Location: nasolabial folds and marionette lines  Filler Type: Restylane Refyne  Lot #[id number] Exp 05/05/2021  Procedure: The area was prepped thoroughly with Puracyn. After introducing the needle into the desired treatment area, the syringe plunger was drawn back to ensure there was no flash of blood prior to injecting the filler in order to minimize risk of intravascular injection and vascular occlusion. After injection of the filler, the treated areas were cleansed and iced to reduce swelling. Post-treatment instructions were reviewed with the patient.       Patient tolerated the procedure well. The patient will call with any problems, questions or concerns prior to their next appointment.   Return in about 4 weeks (around 08/12/2020) for Botox follow up.  I, Ashok Cordia, CMA, am acting as scribe for Sarina Ser, MD .  Documentation: I have reviewed the above documentation for accuracy and completeness, and I agree with the above.  Sarina Ser, MD

## 2020-07-19 ENCOUNTER — Encounter: Payer: Self-pay | Admitting: Dermatology

## 2020-07-22 DIAGNOSIS — J3089 Other allergic rhinitis: Secondary | ICD-10-CM | POA: Diagnosis not present

## 2020-07-22 DIAGNOSIS — J301 Allergic rhinitis due to pollen: Secondary | ICD-10-CM | POA: Diagnosis not present

## 2020-07-25 DIAGNOSIS — G4730 Sleep apnea, unspecified: Secondary | ICD-10-CM | POA: Diagnosis not present

## 2020-07-29 ENCOUNTER — Other Ambulatory Visit: Payer: Self-pay | Admitting: Cardiology

## 2020-07-29 DIAGNOSIS — E782 Mixed hyperlipidemia: Secondary | ICD-10-CM

## 2020-08-02 DIAGNOSIS — J3089 Other allergic rhinitis: Secondary | ICD-10-CM | POA: Diagnosis not present

## 2020-08-02 DIAGNOSIS — J301 Allergic rhinitis due to pollen: Secondary | ICD-10-CM | POA: Diagnosis not present

## 2020-08-09 DIAGNOSIS — J3089 Other allergic rhinitis: Secondary | ICD-10-CM | POA: Diagnosis not present

## 2020-08-09 DIAGNOSIS — J301 Allergic rhinitis due to pollen: Secondary | ICD-10-CM | POA: Diagnosis not present

## 2020-08-16 DIAGNOSIS — J3089 Other allergic rhinitis: Secondary | ICD-10-CM | POA: Diagnosis not present

## 2020-08-18 ENCOUNTER — Other Ambulatory Visit: Payer: Self-pay | Admitting: Cardiology

## 2020-08-27 ENCOUNTER — Other Ambulatory Visit: Payer: Self-pay | Admitting: Family Medicine

## 2020-09-05 ENCOUNTER — Other Ambulatory Visit: Payer: Self-pay | Admitting: Family Medicine

## 2020-09-06 DIAGNOSIS — J3089 Other allergic rhinitis: Secondary | ICD-10-CM | POA: Diagnosis not present

## 2020-09-06 DIAGNOSIS — J301 Allergic rhinitis due to pollen: Secondary | ICD-10-CM | POA: Diagnosis not present

## 2020-09-13 DIAGNOSIS — J3089 Other allergic rhinitis: Secondary | ICD-10-CM | POA: Diagnosis not present

## 2020-09-14 ENCOUNTER — Ambulatory Visit (INDEPENDENT_AMBULATORY_CARE_PROVIDER_SITE_OTHER): Payer: Self-pay | Admitting: Dermatology

## 2020-09-14 ENCOUNTER — Other Ambulatory Visit: Payer: Self-pay

## 2020-09-14 DIAGNOSIS — L988 Other specified disorders of the skin and subcutaneous tissue: Secondary | ICD-10-CM

## 2020-09-14 NOTE — Progress Notes (Signed)
   Follow-Up Visit   Subjective  Cindy Hester is a 75 y.o. female who presents for the following: Facial Elastosis (4 weeks f/u Botox on her face ).  The following portions of the chart were reviewed this encounter and updated as appropriate:   Tobacco  Allergies  Meds  Problems  Med Hx  Surg Hx  Fam Hx     Review of Systems:  No other skin or systemic complaints except as noted in HPI or Assessment and Plan.  Objective  Well appearing patient in no apparent distress; mood and affect are within normal limits.  A focused examination was performed including face. Relevant physical exam findings are noted in the Assessment and Plan.  face Rhytides and volume loss.                       Assessment & Plan  Elastosis of skin face  Filling material injection - face Location: See attached image  Informed consent: Discussed risks (infection, pain, bleeding, bruising, swelling, allergic reaction, paralysis of nearby muscles, eyelid droop, double vision, neck weakness, difficulty breathing, headache, undesirable cosmetic result, and need for additional treatment) and benefits of the procedure, as well as the alternatives.  Informed consent was obtained.  Preparation: The area was cleansed with alcohol.  Procedure Details:  Botox was injected into the dermis with a 30-gauge needle. Pressure applied to any bleeding. Ice packs offered for swelling.  Lot Number:  [id number] Expiration:  07/2022  Total Units Injected:  5  Plan: Patient was instructed to remain upright for 4 hours. Patient was instructed to avoid massaging the face and avoid vigorous exercise for the rest of the day. Tylenol may be used for headache.  Allow 2 weeks before returning to clinic for additional dosing as needed. Patient will call for any problems.   Filling material injection - face Prior to the procedure, the patient's past medical history, allergies and the rare but potential risks and  complications were reviewed with the patient and a signed consent was obtained. Pre and post-treatment care was discussed and instructions provided.  Location: nasolabial folds and marionette lines  Filler Type: Restylane refyne Lot [id number] Exp 06/04/2021  Procedure: The area was prepped thoroughly with Puracyn. After introducing the needle into the desired treatment area, the syringe plunger was drawn back to ensure there was no flash of blood prior to injecting the filler in order to minimize risk of intravascular injection and vascular occlusion. After injection of the filler, the treated areas were cleansed and iced to reduce swelling. Post-treatment instructions were reviewed with the patient.       Patient tolerated the procedure well. The patient will call with any problems, questions or concerns prior to their next appointment.   Return in about 2 months (around 11/14/2020) for Botox.  IMarye Round, CMA, am acting as scribe for Sarina Ser, MD .  Documentation: I have reviewed the above documentation for accuracy and completeness, and I agree with the above.  Sarina Ser, MD

## 2020-09-14 NOTE — Patient Instructions (Signed)

## 2020-09-20 ENCOUNTER — Encounter: Payer: Self-pay | Admitting: Dermatology

## 2020-09-20 DIAGNOSIS — J3089 Other allergic rhinitis: Secondary | ICD-10-CM | POA: Diagnosis not present

## 2020-09-23 ENCOUNTER — Other Ambulatory Visit: Payer: Self-pay | Admitting: Family Medicine

## 2020-09-23 DIAGNOSIS — R600 Localized edema: Secondary | ICD-10-CM

## 2020-09-23 NOTE — Telephone Encounter (Signed)
Requested medications are due for refill today yes  Requested medications are on the active medication list signature different  Last refill 6/19  Last visit 09/2019  Future visit scheduled 09/28/20  Notes to clinic Current med list signature is to take 1/2 tablet as needed, this rx is take 1/2 tablet EVERY DAY as needed, please assess. Also failed protocol of visit within 6 months, however, has scheduled appt.

## 2020-09-27 DIAGNOSIS — J301 Allergic rhinitis due to pollen: Secondary | ICD-10-CM | POA: Diagnosis not present

## 2020-09-27 DIAGNOSIS — J3089 Other allergic rhinitis: Secondary | ICD-10-CM | POA: Diagnosis not present

## 2020-09-28 ENCOUNTER — Other Ambulatory Visit: Payer: Self-pay

## 2020-09-28 ENCOUNTER — Encounter: Payer: Self-pay | Admitting: Family Medicine

## 2020-09-28 ENCOUNTER — Ambulatory Visit (INDEPENDENT_AMBULATORY_CARE_PROVIDER_SITE_OTHER): Payer: BC Managed Care – PPO | Admitting: Family Medicine

## 2020-09-28 VITALS — BP 114/67 | HR 76 | Temp 98.3°F | Resp 16 | Ht 67.0 in | Wt 185.0 lb

## 2020-09-28 DIAGNOSIS — K219 Gastro-esophageal reflux disease without esophagitis: Secondary | ICD-10-CM

## 2020-09-28 DIAGNOSIS — Z794 Long term (current) use of insulin: Secondary | ICD-10-CM | POA: Diagnosis not present

## 2020-09-28 DIAGNOSIS — Z23 Encounter for immunization: Secondary | ICD-10-CM | POA: Diagnosis not present

## 2020-09-28 DIAGNOSIS — Z1211 Encounter for screening for malignant neoplasm of colon: Secondary | ICD-10-CM

## 2020-09-28 DIAGNOSIS — E1142 Type 2 diabetes mellitus with diabetic polyneuropathy: Secondary | ICD-10-CM | POA: Diagnosis not present

## 2020-09-28 DIAGNOSIS — E039 Hypothyroidism, unspecified: Secondary | ICD-10-CM

## 2020-09-28 DIAGNOSIS — E782 Mixed hyperlipidemia: Secondary | ICD-10-CM

## 2020-09-28 DIAGNOSIS — Z0000 Encounter for general adult medical examination without abnormal findings: Secondary | ICD-10-CM | POA: Diagnosis not present

## 2020-09-28 DIAGNOSIS — I10 Essential (primary) hypertension: Secondary | ICD-10-CM

## 2020-09-28 MED ORDER — SUCRALFATE 1 G PO TABS: 1.0000 g | ORAL_TABLET | Freq: Three times a day (TID) | ORAL | 11 refills | Status: DC

## 2020-09-28 NOTE — Progress Notes (Signed)
I,Cindy Hester,acting as a scribe for Cindy Durie, MD.,have documented all relevant documentation on the behalf of Cindy Durie, MD,as directed by  Cindy Durie, MD while in the presence of Cindy Durie, MD.   Complete physical exam   Patient: Cindy Hester   DOB: July 24, 1945   75 y.o. Female  MRN: [id number] Visit Date: 09/28/2020  Today's healthcare provider: Wilhemena Durie, MD   Chief Complaint  Patient presents with   Annual Exam   Subjective    Cindy Hester is a 75 y.o. female who presents today for a complete physical exam.  She reports consuming a general diet. Home exercise routine includes walking. She generally feels well. She reports sleeping fairly well. She does not have additional problems to discuss today.  HPI  She does wear CPAP at night for sleep apnea.  She wears a leg compression machine at night She does have reflux symptoms  Past Medical History:  Diagnosis Date   Arthritis    right shoulder blade, left thumb   Diabetes mellitus without complication (HCC)    GERD (gastroesophageal reflux disease)    Heart murmur    followed by PCP   High cholesterol    History of esophagitis    History of gastritis    HLD (hyperlipidemia)    Hypertension    Sleep apnea    CPAP   Past Surgical History:  Procedure Laterality Date   BREAST CYST EXCISION Left    removed two times   [place]   CYST EXCISION     from left breast   ESOPHAGOGASTRODUODENOSCOPY (EGD) WITH PROPOFOL N/A 12/12/2015   gastritis, LA Grade A reflux esophagitis ESOPHAGOGASTRODUODENOSCOPY (EGD) WITH PROPOFOL;  Surgeon: Cindy Silvas, MD;  Location: [facility] ENDOSCOPY;  Service: Endoscopy;  Laterality: N/A;  Diabetic   HAMMER TOE SURGERY Bilateral 05/30/2016   Procedure: HAMMER TOE CORRECTION RIGHT 2ND, 3RD, 4TH, 5TH TOES LEFT 5TH TOE;  Surgeon: Cindy Hester, DPM;  Location: [place];  Service: Podiatry;  Laterality: Bilateral;   HEEL SPUR  SURGERY Right 2011   NASAL SINUS SURGERY     TONSILLECTOMY     Social History   Socioeconomic History   Marital status: Single    Spouse name: Not on file   Number of children: 1   Years of education: master's   Highest education level: Not on file  Occupational History   Occupation: Training and development officer: [employer]  Tobacco Use   Smoking status: Never   Smokeless tobacco: Never  Vaping Use   Vaping Use: Never used  Substance and Sexual Activity   Alcohol use: Yes    Comment: occ   Drug use: No   Sexual activity: Not on file  Other Topics Concern   Not on file  Social History Narrative   Not on file   Social Determinants of Health   Financial Resource Strain: Not on file  Food Insecurity: Not on file  Transportation Needs: Not on file  Physical Activity: Not on file  Stress: Not on file  Social Connections: Not on file  Intimate Partner Violence: Not on file   Family Status  Relation Name Status   Mother  Deceased at age 71   Father  Deceased at age 91   Brother  [place]  Deceased   [place]  Deceased   MGM  Deceased   MGF  Deceased   PGM  Deceased   PGF  Deceased   Brother  Alive   Neg Hx  (Not Specified)   Family History  Problem Relation Age of Onset   Stroke Mother    Diabetes Mother    Hypertension Mother    Congestive Heart Failure Father    Hypertension Father    CAD Father    Glaucoma Father    Hyperlipidemia Brother    Breast cancer Paternal Aunt    Breast cancer Paternal Aunt    Diabetes Brother    Stomach cancer Neg Hx    Colon cancer Neg Hx    Allergies  Allergen Reactions   Tape     Most tapes cause redness.  Paper tape and tegaderm are OK.    Patient Care Team: Cindy Banana., MD as PCP - General (Family Medicine)   Medications: Outpatient Medications Prior to Visit  Medication Sig   acetaminophen (TYLENOL) 325 MG tablet Take 325 mg by mouth every 6 (six) hours as needed.    amLODipine (NORVASC) 10 MG tablet TAKE 1 TABLET(10 MG) BY MOUTH DAILY   aspirin 81 MG tablet Take 1 tablet (81 mg total) daily by mouth.   BIOTIN 5000 PO Take by mouth daily.   cholecalciferol (VITAMIN D) 1000 UNITS tablet Take 5,000 Units by mouth.  Twice a week    empagliflozin (JARDIANCE) 25 MG TABS tablet Take 25 mg by mouth daily.   EPINEPHrine 0.3 mg/0.3 mL IJ SOAJ injection INJ UTD   ezetimibe (ZETIA) 10 MG tablet TAKE 1 TABLET(10 MG) BY MOUTH DAILY AFTER SUPPER   fluticasone (FLONASE) 50 MCG/ACT nasal spray Place into both nostrils as needed for allergies or rhinitis.   furosemide (LASIX) 40 MG tablet TAKE 1/2 TABLET BY MOUTH EVERY DAY AS NEEDED   metoprolol (TOPROL-XL) 200 MG 24 hr tablet TAKE 1 TABLET(200 MG) BY MOUTH DAILY   mometasone (NASONEX) 50 MCG/ACT nasal spray as needed.    montelukast (SINGULAIR) 10 MG tablet TAKE 1 TABLET BY MOUTH DAILY   Multiple Vitamin (MULTIVITAMIN) tablet Take 1 tablet by mouth daily.   NON FORMULARY CPAP   ondansetron (ZOFRAN ODT) 4 MG disintegrating tablet Take 1 tablet (4 mg total) by mouth every 8 (eight) hours as needed for nausea or vomiting.   OZEMPIC, 0.25 OR 0.5 MG/DOSE, 2 MG/1.5ML SOPN INJ 0.5 MG [place] ONCE A WEEK   pantoprazole (PROTONIX) 40 MG tablet TAKE 1 TABLET(40 MG) BY MOUTH DAILY   rosuvastatin (CRESTOR) 20 MG tablet TAKE 1 TABLET(20 MG) BY MOUTH AT BEDTIME   spironolactone (ALDACTONE) 25 MG tablet TAKE 1 TABLET BY MOUTH EVERY DAY   valACYclovir (VALTREX) 500 MG tablet TAKE 1 TABLET BY MOUTH TWICE DAILY IF NEEDED   valsartan-hydrochlorothiazide (DIOVAN-HCT) 160-25 MG tablet TAKE 1 TABLET BY MOUTH EVERY MORNING   vitamin B-12 (CYANOCOBALAMIN) 1000 MCG tablet Take 1,000 mcg by mouth daily.   [DISCONTINUED] Glucosamine-Chondroit-Vit C-Mn (GLUCOSAMINE CHONDR 1500 COMPLX PO) Take 1 tablet by mouth daily. (Patient not taking: Reported on 09/28/2020)   [DISCONTINUED] phentermine (ADIPEX-P) 37.5 MG tablet Take 1 tablet by mouth daily. (Patient  not taking: Reported on 09/28/2020)   No facility-administered medications prior to visit.    Review of Systems  HENT:  Positive for sneezing.   Respiratory:  Positive for apnea.   Gastrointestinal:  Positive for constipation and diarrhea.  Musculoskeletal:  Positive for arthralgias.  Allergic/Immunologic: Positive for environmental allergies.  All other systems reviewed and are negative.  Last hemoglobin  A1c Lab Results  Component Value Date   HGBA1C 6.7 (H) 09/28/2020      Objective    BP 114/67 (BP Location: Right Arm, Patient Position: Sitting, Cuff Size: Large)   Pulse 76   Temp 98.3 F (36.8 C) (Oral)   Resp 16   Ht '5\' 7"'$  (1.702 m)   Wt 185 lb (83.9 kg)   SpO2 99%   BMI 28.98 kg/m  BP Readings from Last 3 Encounters:  09/28/20 114/67  10/28/19 (!) 131/56  09/28/19 121/64   Wt Readings from Last 3 Encounters:  09/28/20 185 lb (83.9 kg)  10/28/19 190 lb (86.2 kg)  09/28/19 188 lb (85.3 kg)      Physical Exam Vitals reviewed.  Constitutional:      Appearance: She is well-developed. She is obese.  HENT:     Head: Normocephalic and atraumatic.     Right Ear: External ear normal.     Left Ear: External ear normal.     Nose: Nose normal.  Eyes:     General: No scleral icterus.    Conjunctiva/sclera: Conjunctivae normal.  Neck:     Thyroid: No thyromegaly.  Cardiovascular:     Rate and Rhythm: Normal rate and regular rhythm.     Heart sounds: Murmur heard.     Comments: Soft 2/6 systolic Murmur Pulmonary:     Effort: Pulmonary effort is normal.     Breath sounds: Normal breath sounds.  Abdominal:     Palpations: Abdomen is soft.  Lymphadenopathy:     Cervical: No cervical adenopathy.  Skin:    General: Skin is warm and dry.     Comments: She has very fair skin with only occasional solar keratosis.  Neurological:     General: No focal deficit present.     Mental Status: She is alert and oriented to person, place, and time.     Comments: Decrease  sensation to monofilament in both distal feet.  Psychiatric:        Mood and Affect: Mood normal.        Behavior: Behavior normal.        Thought Content: Thought content normal.        Judgment: Judgment normal.      Last depression screening scores PHQ 2/9 Scores 09/28/2020 09/28/2019 09/23/2018  PHQ - 2 Score 0 0 0  PHQ- 9 Score 0 1 -   Last fall risk screening Fall Risk  09/28/2020  Falls in the past year? 0  Number falls in past yr: 0  Injury with Fall? 0  Risk for fall due to : No Fall Risks  Follow up Falls evaluation completed   Last Audit-C alcohol use screening Alcohol Use Disorder Test (AUDIT) 09/28/2020  1. How often do you have a drink containing alcohol? 1  2. How many drinks containing alcohol do you have on a typical day when you are drinking? 0  3. How often do you have six or more drinks on one occasion? 0  AUDIT-C Score 1  Alcohol Brief Interventions/Follow-up -   A score of 3 or more in women, and 4 or more in men indicates increased risk for alcohol abuse, EXCEPT if all of the points are from question 1   No results found for any visits on 09/28/20.  Assessment & Plan    Routine Health Maintenance and Physical Exam  Exercise Activities and Dietary recommendations  Goals   None     Immunization History  Administered Date(s)  Administered   Influenza, High Dose Seasonal PF 10/31/2016, 11/04/2017   Influenza-Unspecified 10/07/2014, 11/13/2018   Pneumococcal Conjugate-13 04/13/2014   Pneumococcal Polysaccharide-23 11/18/2012   Tdap 07/19/2009   Zoster, Live 05/26/2009    Health Maintenance  Topic Date Due   FOOT EXAM  Never done   Zoster Vaccines- Shingrix (1 of 2) Never done   TETANUS/TDAP  07/20/2019   HEMOGLOBIN A1C  08/11/2019   COVID-19 Vaccine (2 - Moderna risk series) 02/02/2020   INFLUENZA VACCINE  09/05/2020   OPHTHALMOLOGY EXAM  11/08/2020   COLONOSCOPY (Pts 45-30yr Insurance coverage will need to be confirmed)  01/14/2021    MAMMOGRAM  06/30/2022   DEXA SCAN  Completed   Hepatitis C Screening  Completed   PNA vac Low Risk Adult  Completed   HPV VACCINES  Aged Out    Discussed health benefits of physical activity, and encouraged her to engage in regular exercise appropriate for her age and condition.  1. Annual physical exam Well woman exam per GYN. Patient needs tetanus and Shingrix and request both here 2. Type 2 diabetes mellitus with diabetic polyneuropathy, with long-term current use of insulin (HCC)  - Hemoglobin A1c  3. Essential hypertension, benign  - CBC w/Diff/Platelet - Comprehensive Metabolic Panel (CMET)  4. Mixed hyperlipidemia  - Lipid panel - TSH - Comprehensive Metabolic Panel (CMET)  5. Adult hypothyroidism   6. Need for Tdap vaccination  - Tdap vaccine greater than or equal to 7yo IM  7. Need for shingles vaccine  - Varicella-zoster vaccine IM (Shingrix)  8. Screening for colon cancer  - Ambulatory referral to Gastroenterology  9. Gastroesophageal reflux disease, unspecified whether esophagitis present Try sucralfate 1 g 3 times daily with meals.  - sucralfate (CARAFATE) 1 g tablet; Take 1 tablet (1 g total) by mouth 3 (three) times daily with meals.  Dispense: 90 tablet; Refill: 11   No follow-ups on file.     I, RWilhemena Durie MD, have reviewed all documentation for this visit. The documentation on 10/05/20 for the exam, diagnosis, procedures, and orders are all accurate and complete.    Bastion Bolger GCranford Mon MD  BSt [facility] [phone](phone) [phone](fax)  [place]

## 2020-09-29 LAB — CBC WITH DIFFERENTIAL/PLATELET
Basophils Absolute: 0.1 10*3/uL (ref 0.0–0.2)
Basos: 1 %
EOS (ABSOLUTE): 0.2 10*3/uL (ref 0.0–0.4)
Eos: 2 %
Hematocrit: 43.2 % (ref 34.0–46.6)
Hemoglobin: 13.7 g/dL (ref 11.1–15.9)
Immature Grans (Abs): 0 10*3/uL (ref 0.0–0.1)
Immature Granulocytes: 0 %
Lymphocytes Absolute: 1.9 10*3/uL (ref 0.7–3.1)
Lymphs: 21 %
MCH: 27.8 pg (ref 26.6–33.0)
MCHC: 31.7 g/dL (ref 31.5–35.7)
MCV: 88 fL (ref 79–97)
Monocytes Absolute: 0.8 10*3/uL (ref 0.1–0.9)
Monocytes: 9 %
Neutrophils Absolute: 6.3 10*3/uL (ref 1.4–7.0)
Neutrophils: 67 %
Platelets: 323 10*3/uL (ref 150–450)
RBC: 4.92 x10E6/uL (ref 3.77–5.28)
RDW: 12.8 % (ref 11.7–15.4)
WBC: 9.3 10*3/uL (ref 3.4–10.8)

## 2020-09-29 LAB — COMPREHENSIVE METABOLIC PANEL
ALT: 22 IU/L (ref 0–32)
AST: 22 IU/L (ref 0–40)
Albumin/Globulin Ratio: 2.4 — ABNORMAL HIGH (ref 1.2–2.2)
Albumin: 4.7 g/dL (ref 3.7–4.7)
Alkaline Phosphatase: 69 IU/L (ref 44–121)
BUN/Creatinine Ratio: 22 (ref 12–28)
BUN: 28 mg/dL — ABNORMAL HIGH (ref 8–27)
Bilirubin Total: 0.3 mg/dL (ref 0.0–1.2)
CO2: 26 mmol/L (ref 20–29)
Calcium: 10 mg/dL (ref 8.7–10.3)
Chloride: 96 mmol/L (ref 96–106)
Creatinine, Ser: 1.25 mg/dL — ABNORMAL HIGH (ref 0.57–1.00)
Globulin, Total: 2 g/dL (ref 1.5–4.5)
Glucose: 120 mg/dL — ABNORMAL HIGH (ref 65–99)
Potassium: 5.2 mmol/L (ref 3.5–5.2)
Sodium: 136 mmol/L (ref 134–144)
Total Protein: 6.7 g/dL (ref 6.0–8.5)
eGFR: 46 mL/min/{1.73_m2} — ABNORMAL LOW (ref >59)

## 2020-09-29 LAB — LIPID PANEL
Chol/HDL Ratio: 2.5 ratio (ref 0.0–4.4)
Cholesterol, Total: 151 mg/dL (ref 100–199)
HDL: 61 mg/dL (ref >39)
LDL Chol Calc (NIH): 62 mg/dL (ref 0–99)
Triglycerides: 168 mg/dL — ABNORMAL HIGH (ref 0–149)
VLDL Cholesterol Cal: 28 mg/dL (ref 5–40)

## 2020-09-29 LAB — TSH: TSH: 1.5 u[IU]/mL (ref 0.450–4.500)

## 2020-09-29 LAB — HEMOGLOBIN A1C
Est. average glucose Bld gHb Est-mCnc: 146 mg/dL
Hgb A1c MFr Bld: 6.7 % — ABNORMAL HIGH (ref 4.8–5.6)

## 2020-10-04 ENCOUNTER — Other Ambulatory Visit: Payer: Self-pay | Admitting: Family Medicine

## 2020-10-04 DIAGNOSIS — E785 Hyperlipidemia, unspecified: Secondary | ICD-10-CM

## 2020-10-04 DIAGNOSIS — J3089 Other allergic rhinitis: Secondary | ICD-10-CM | POA: Diagnosis not present

## 2020-10-04 DIAGNOSIS — J301 Allergic rhinitis due to pollen: Secondary | ICD-10-CM | POA: Diagnosis not present

## 2020-10-04 NOTE — Telephone Encounter (Signed)
Valid encounter. Future visit in 12 months

## 2020-10-05 ENCOUNTER — Other Ambulatory Visit: Payer: Self-pay | Admitting: Family Medicine

## 2020-10-05 ENCOUNTER — Telehealth: Payer: Self-pay

## 2020-10-05 DIAGNOSIS — N289 Disorder of kidney and ureter, unspecified: Secondary | ICD-10-CM

## 2020-10-05 DIAGNOSIS — K121 Other forms of stomatitis: Secondary | ICD-10-CM

## 2020-10-05 NOTE — Telephone Encounter (Signed)
Patient advised. Lab ordered.

## 2020-10-05 NOTE — Telephone Encounter (Signed)
Valid encounter. Future visit in 12 months

## 2020-10-05 NOTE — Telephone Encounter (Signed)
-----   Message from Jerrol Banana., MD sent at 10/04/2020  3:16 PM EDT ----- Labs stable.  Except kidney function is slightly decreased.  Hydrate and avoid all anti-inflammatory drugs.  Repeat labs/renal panel in 2 to 4 months.  Please advise patient.

## 2020-10-11 ENCOUNTER — Other Ambulatory Visit: Payer: Self-pay | Admitting: Family Medicine

## 2020-10-17 ENCOUNTER — Other Ambulatory Visit: Payer: BC Managed Care – PPO

## 2020-10-18 DIAGNOSIS — M1612 Unilateral primary osteoarthritis, left hip: Secondary | ICD-10-CM | POA: Diagnosis not present

## 2020-10-18 DIAGNOSIS — M1711 Unilateral primary osteoarthritis, right knee: Secondary | ICD-10-CM | POA: Diagnosis not present

## 2020-10-18 DIAGNOSIS — J3089 Other allergic rhinitis: Secondary | ICD-10-CM | POA: Diagnosis not present

## 2020-10-18 DIAGNOSIS — J301 Allergic rhinitis due to pollen: Secondary | ICD-10-CM | POA: Diagnosis not present

## 2020-10-18 DIAGNOSIS — M25552 Pain in left hip: Secondary | ICD-10-CM | POA: Diagnosis not present

## 2020-10-19 DIAGNOSIS — E1159 Type 2 diabetes mellitus with other circulatory complications: Secondary | ICD-10-CM | POA: Diagnosis not present

## 2020-10-19 DIAGNOSIS — E1169 Type 2 diabetes mellitus with other specified complication: Secondary | ICD-10-CM | POA: Diagnosis not present

## 2020-10-19 DIAGNOSIS — E785 Hyperlipidemia, unspecified: Secondary | ICD-10-CM | POA: Diagnosis not present

## 2020-10-19 DIAGNOSIS — I152 Hypertension secondary to endocrine disorders: Secondary | ICD-10-CM | POA: Diagnosis not present

## 2020-10-19 DIAGNOSIS — E559 Vitamin D deficiency, unspecified: Secondary | ICD-10-CM | POA: Diagnosis not present

## 2020-10-20 ENCOUNTER — Other Ambulatory Visit: Payer: Self-pay

## 2020-10-20 DIAGNOSIS — I10 Essential (primary) hypertension: Secondary | ICD-10-CM

## 2020-10-20 DIAGNOSIS — I6523 Occlusion and stenosis of bilateral carotid arteries: Secondary | ICD-10-CM

## 2020-10-21 ENCOUNTER — Ambulatory Visit: Payer: BC Managed Care – PPO

## 2020-10-21 ENCOUNTER — Other Ambulatory Visit: Payer: Self-pay

## 2020-10-21 DIAGNOSIS — I6523 Occlusion and stenosis of bilateral carotid arteries: Secondary | ICD-10-CM | POA: Diagnosis not present

## 2020-10-25 DIAGNOSIS — J3089 Other allergic rhinitis: Secondary | ICD-10-CM | POA: Diagnosis not present

## 2020-10-25 DIAGNOSIS — J301 Allergic rhinitis due to pollen: Secondary | ICD-10-CM | POA: Diagnosis not present

## 2020-10-27 DIAGNOSIS — M25552 Pain in left hip: Secondary | ICD-10-CM | POA: Diagnosis not present

## 2020-10-27 DIAGNOSIS — M1612 Unilateral primary osteoarthritis, left hip: Secondary | ICD-10-CM | POA: Diagnosis not present

## 2020-10-28 ENCOUNTER — Ambulatory Visit: Payer: BC Managed Care – PPO | Admitting: Cardiology

## 2020-11-01 DIAGNOSIS — J301 Allergic rhinitis due to pollen: Secondary | ICD-10-CM | POA: Diagnosis not present

## 2020-11-01 DIAGNOSIS — J3089 Other allergic rhinitis: Secondary | ICD-10-CM | POA: Diagnosis not present

## 2020-11-08 ENCOUNTER — Ambulatory Visit: Payer: BC Managed Care – PPO | Admitting: Cardiology

## 2020-11-08 ENCOUNTER — Other Ambulatory Visit: Payer: Self-pay

## 2020-11-08 ENCOUNTER — Encounter: Payer: Self-pay | Admitting: Cardiology

## 2020-11-08 VITALS — BP 114/69 | HR 86 | Temp 97.3°F | Resp 16 | Ht 67.0 in | Wt 183.8 lb

## 2020-11-08 DIAGNOSIS — J3089 Other allergic rhinitis: Secondary | ICD-10-CM | POA: Diagnosis not present

## 2020-11-08 DIAGNOSIS — I6523 Occlusion and stenosis of bilateral carotid arteries: Secondary | ICD-10-CM

## 2020-11-08 DIAGNOSIS — N1831 Chronic kidney disease, stage 3a: Secondary | ICD-10-CM

## 2020-11-08 DIAGNOSIS — E782 Mixed hyperlipidemia: Secondary | ICD-10-CM

## 2020-11-08 DIAGNOSIS — I10 Essential (primary) hypertension: Secondary | ICD-10-CM

## 2020-11-08 DIAGNOSIS — E1122 Type 2 diabetes mellitus with diabetic chronic kidney disease: Secondary | ICD-10-CM | POA: Diagnosis not present

## 2020-11-08 DIAGNOSIS — J301 Allergic rhinitis due to pollen: Secondary | ICD-10-CM | POA: Diagnosis not present

## 2020-11-08 DIAGNOSIS — I129 Hypertensive chronic kidney disease with stage 1 through stage 4 chronic kidney disease, or unspecified chronic kidney disease: Secondary | ICD-10-CM | POA: Diagnosis not present

## 2020-11-08 NOTE — Progress Notes (Signed)
Primary Physician/Referring:  Jerrol Banana., MD  Patient ID: Cindy Hester, female    DOB: [birth year], 75 y.o.   MRN: [id number]  Chief Complaint  Patient presents with   Carotid Stenosis   Hypertension   Hyperlipidemia   Follow-up    1 year    HPI:    HPI: Cindy Hester  is a 75 y.o. Caucasian female patient with type 2 diabetes, hypertension, mild hyperlipidemia, chronic venous insufficiency, lymphedema, obstructive sleep apnea on CPAP and compliant, asymptomatic mild to moderate bilateral carotid artery stenosis presents here for annual visit.    Except for chronic mild dyspnea, arthritis in her hip, she has no specific symptoms today.  Past Medical History:  Diagnosis Date   Arthritis    right shoulder blade, left thumb   Diabetes mellitus without complication (HCC)    GERD (gastroesophageal reflux disease)    Heart murmur    followed by PCP   High cholesterol    History of esophagitis    History of gastritis    HLD (hyperlipidemia)    Hypertension    Sleep apnea    CPAP   Past Surgical History:  Procedure Laterality Date   BREAST CYST EXCISION Left    removed two times   [place]   CYST EXCISION     from left breast   ESOPHAGOGASTRODUODENOSCOPY (EGD) WITH PROPOFOL N/A 12/12/2015   gastritis, LA Grade A reflux esophagitis ESOPHAGOGASTRODUODENOSCOPY (EGD) WITH PROPOFOL;  Surgeon: Manya Silvas, MD;  Location: [place];  Service: Endoscopy;  Laterality: N/A;  Diabetic   HAMMER TOE SURGERY Bilateral 05/30/2016   Procedure: HAMMER TOE CORRECTION RIGHT 2ND, 3RD, 4TH, 5TH TOES LEFT 5TH TOE;  Surgeon: Samara Deist, DPM;  Location: [place];  Service: Podiatry;  Laterality: Bilateral;   HEEL SPUR SURGERY Right 2011   NASAL SINUS SURGERY     TONSILLECTOMY     Social History   Tobacco Use   Smoking status: Never   Smokeless tobacco: Never  Substance Use Topics   Alcohol use: Yes    Comment: occ   Marital Status:  Single  ROS  Review of Systems  Cardiovascular:  Positive for dyspnea on exertion and leg swelling (occasional). Negative for chest pain.  Gastrointestinal:  Negative for melena.  Objective  Blood pressure 114/69, pulse 86, temperature (!) 97.3 F (36.3 C), temperature source Temporal, resp. rate 16, height 5' 7" (1.702 m), weight 183 lb 12.8 oz (83.4 kg), SpO2 95 %. Body mass index is 28.79 kg/m.  Vitals with BMI 11/08/2020 09/28/2020 10/28/2019  Height 5' 7" 5' 7" 5' 7"  Weight 183 lbs 13 oz 185 lbs 190 lbs  BMI 28.78 80.99 83.38  Systolic 250 539 767  Diastolic 69 67 56  Pulse 86 76 77     Physical Exam Constitutional:      Appearance: She is obese.  HENT:     Head: Atraumatic.  Cardiovascular:     Rate and Rhythm: Normal rate and regular rhythm.     Pulses: Normal pulses and intact distal pulses.          Carotid pulses are  on the right side with bruit and  on the left side with bruit.    Heart sounds: S1 normal and S2 normal. Murmur heard.  Midsystolic murmur is present with a grade of 2/6 at the upper right sternal border and apex.    No gallop.     Comments: No edema.  No JVD.  Pulmonary:     Effort: Pulmonary effort is normal.     Breath sounds: Normal breath sounds.  Abdominal:     General: Bowel sounds are normal.     Palpations: Abdomen is soft.   Radiology: No results found.  Laboratory examination:   CMP Latest Ref Rng & Units 09/28/2020 09/28/2019 09/23/2018  Glucose 65 - 99 mg/dL 120(H) 112(H) 100(H)  BUN 8 - 27 mg/dL 28(H) 22 14  Creatinine 0.57 - 1.00 mg/dL 1.25(H) 0.81 0.94  Sodium 134 - 144 mmol/L 136 139 140  Potassium 3.5 - 5.2 mmol/L 5.2 4.4 4.9  Chloride 96 - 106 mmol/L 96 101 100  CO2 20 - 29 mmol/L _0 Calcium 8.7 - 10.3 mg/dL 10.0 9.4 10.0  Total Protein 6.0 - 8.5 g/dL 6.7 6.1 6.3  Total Bilirubin 0.0 - 1.2 mg/dL 0.3 0.3 0.3  Alkaline Phos 44 - 121 IU/L 69 56 71  AST 0 - 40 IU/L _1 ALT 0 - 32 IU/L _2 CBC Latest Ref Rng  & Units 09/28/2020 09/28/2019 09/23/2018  WBC 3.4 - 10.8 x10E3/uL 9.3 8.0 8.1  Hemoglobin 11.1 - 15.9 g/dL 13.7 13.4 13.2  Hematocrit 34.0 - 46.6 % 43.2 41.2 41.1  Platelets 150 - 450 x10E3/uL 323 243 331    Lipid Panel Recent Labs    01/25/20 1459 09/28/20 1024  CHOL 129 151  TRIG 99 168*  LDLCALC 46 62  HDL 65 61  CHOLHDL  --  2.5    HEMOGLOBIN A1C Lab Results  Component Value Date   HGBA1C 6.7 (H) 09/28/2020   TSH Recent Labs    09/28/20 1024  TSH 1.500    External Labs:  02/21/2018: Glucose 135, creatinine 0.73, EGFR 84/96, potassium 4.8, BMP otherwise normal.    Medications   Current Outpatient Medications on File Prior to Visit  Medication Sig Dispense Refill   acetaminophen (TYLENOL) 325 MG tablet Take 325 mg by mouth every 6 (six) hours as needed.     amLODipine (NORVASC) 10 MG tablet TAKE 1 TABLET(10 MG) BY MOUTH DAILY 90 tablet 3   aspirin 81 MG tablet Take 1 tablet (81 mg total) daily by mouth. 30 tablet    azelastine (OPTIVAR) 0.05 % ophthalmic solution Place 1 drop into both eyes as needed.     BIOTIN 5000 PO Take by mouth daily.     cholecalciferol (VITAMIN D) 1000 UNITS tablet Take 5,000 Units by mouth.  Twice a week      empagliflozin (JARDIANCE) 25 MG TABS tablet Take 25 mg by mouth daily.     EPINEPHrine 0.3 mg/0.3 mL IJ SOAJ injection INJ UTD  1   ezetimibe (ZETIA) 10 MG tablet TAKE 1 TABLET(10 MG) BY MOUTH DAILY AFTER SUPPER 90 tablet 3   fluticasone (FLONASE) 50 MCG/ACT nasal spray Place 1 spray into the nose as needed.     furosemide (LASIX) 40 MG tablet TAKE 1/2 TABLET BY MOUTH EVERY DAY AS NEEDED 30 tablet 0   metoprolol (TOPROL-XL) 200 MG 24 hr tablet TAKE 1 TABLET(200 MG) BY MOUTH DAILY 90 tablet 4   montelukast (SINGULAIR) 10 MG tablet TAKE 1 TABLET BY MOUTH DAILY 90 tablet 2   Multiple Vitamin (MULTIVITAMIN) tablet Take 1 tablet by mouth daily.     NON FORMULARY CPAP     ondansetron (ZOFRAN ODT) 4 MG disintegrating tablet Take 1 tablet (4  mg total) by mouth every 8 (eight) hours as  needed for nausea or vomiting. 90 tablet 1   OZEMPIC, 0.25 OR 0.5 MG/DOSE, 2 MG/1.5ML SOPN INJ 0.5 MG [place] ONCE A WEEK     pantoprazole (PROTONIX) 40 MG tablet TAKE 1 TABLET(40 MG) BY MOUTH DAILY 90 tablet 1   phentermine 37.5 MG capsule Take 37.5 mg by mouth every morning.     rosuvastatin (CRESTOR) 20 MG tablet TAKE 1 TABLET(20 MG) BY MOUTH AT BEDTIME 90 tablet 0   spironolactone (ALDACTONE) 25 MG tablet TAKE 1 TABLET BY MOUTH EVERY DAY 90 tablet 1   sucralfate (CARAFATE) 1 g tablet Take 1 tablet (1 g total) by mouth 3 (three) times daily with meals. 90 tablet 11   valACYclovir (VALTREX) 500 MG tablet TAKE 1 TABLET BY MOUTH TWICE DAILY IF NEEDED 60 tablet 3   valsartan-hydrochlorothiazide (DIOVAN-HCT) 160-25 MG tablet TAKE 1 TABLET BY MOUTH EVERY MORNING 90 tablet 3   vitamin B-12 (CYANOCOBALAMIN) 1000 MCG tablet Take 1,000 mcg by mouth daily.     No current facility-administered medications on file prior to visit.    Cardiac Studies:   Lexiscan Sestamibi stress test 01/01/2018: 1. Lexiscan stress test with low level exercise was performed. Patient reached heart rate of 140 bpm, which was 93% of the maximum predicted heart rate. Stress symptoms included dyspnea, nausea, dizziness. Exaggerated blood pressure response seen. Resting hypertension at 166/80 mmHg was seen, with exaggerated response with peak exercise BP reaching 240/70 mmHg. The stress electrocardiogram showed sinus tachycardia, normal stress conduction, no stress arrhythmias and normal stress repolarization. No ischemic changes seen on stress electrocardiogram. 2. The overall quality of the study is excellent. There is no evidence of abnormal lung activity. SPECT images demonstrate very small area of mild intensity, reversible perfusion defect in inferior apical myocardium. Gated SPECT imaging reveals normal myocardial thickening and wall motion. The left ventricular ejection fraction was  normal (68%). 3. Low risk study.  Echocardiogram 12/27/2017: Left ventricle cavity is normal in size. Mild concentric hypertrophy of the left ventricle. Normal global wall motion. Visual EF is 50-55%. Doppler evidence of grade I (impaired) diastolic dysfunction, normal LAP. Mild calcification of the mitral valve annulus. Mild mitral valve leaflet calcification. Trace mitral regurgitation. Trace tricuspid regurgitation. Mild calcification of the TV chordae noted. No evidence of pulmonary hypertension. IVC is normal in size with blunter respiratory response and may indicate elevated central venous pressure  Carotid artery duplex 10/21/2020: Duplex suggests stenosis in the right internal carotid artery (16-49%). Duplex suggests stenosis in the right external carotid artery (<50%). Duplex suggests stenosis in the left internal carotid artery (16-49%). Duplex suggests stenosis in the left external carotid artery (<50%). There is mild homogenous plaque noted in bilateral carotid arteries. Antegrade right vertebral artery flow. Antegrade left vertebral artery flow. No significant change from 09/07/2019. Follow up in one year is appropriate if clinically indicated.  EKG:   EKG 11/08/2020: Sinus rhythm with first-degree AV block at rate of 72 bpm, left atrial enlargement, compared to 10/28/2019, first-degree AV block is new.  Otherwise normal EKG. COMPARED TO EKG 09/15/2018, first-degree AV block  present.   Assessment     ICD-10-CM   1. Bilateral carotid artery stenosis  I65.23 PCV CAROTID DUPLEX (BILATERAL)    2. Essential hypertension  I10 EKG 12-Lead    3. Mixed hyperlipidemia  E78.2     4. Type 2 diabetes mellitus with stage 3a chronic kidney disease, without long-term current use of insulin (HCC)  E11.22    N18.31         Medications Discontinued During This Encounter  Medication Reason   fluticasone (FLONASE) 50 MCG/ACT nasal spray Duplicate   mometasone (NASONEX) 50 MCG/ACT nasal spray  Error   No orders of the defined types were placed in this encounter.  Recommendations:   Laticia J Holdsworth  is a 75 y.o. Caucasian female patient with type 2 diabetes, hypertension, hyperlipidemia, chronic venous insufficiency, lymphedema, obstructive sleep apnea on CPAP and compliant, asymptomatic mild to moderate bilateral carotid artery stenosis presents here for annual visit.    Patient is essentially asymptomatic except for mild chronic stable dyspnea.  She is now having issues with her arthritis and reduce her physical activity.  I reviewed the carotid artery duplex, moderate disease in bilateral carotids.  I also reviewed external labs, LDL is now at goal with addition of Zetia.  However triglycerides elevated and purely related to uncontrolled carbohydrate intake in her diet.  Her A1c is also slightly risen this time.  New diagnosis of stage IIIa chronic kidney disease discussed with the patient.  We have discussed regarding exercise and also making lifestyle changes particularly with regard to changes in the diet and reduction in carbohydrate intake and calorie intake.  Otherwise blood pressures well controlled, physical examination is unchanged.  I will see her back in a year with repeat carotid artery duplex.      Jay Ganji, MD, FACC 11/08/2020, 9:12 AM Office: [phone]  

## 2020-11-09 DIAGNOSIS — E119 Type 2 diabetes mellitus without complications: Secondary | ICD-10-CM | POA: Diagnosis not present

## 2020-11-09 LAB — HM DIABETES EYE EXAM

## 2020-11-10 DIAGNOSIS — J3089 Other allergic rhinitis: Secondary | ICD-10-CM | POA: Diagnosis not present

## 2020-11-10 DIAGNOSIS — J301 Allergic rhinitis due to pollen: Secondary | ICD-10-CM | POA: Diagnosis not present

## 2020-11-17 DIAGNOSIS — J301 Allergic rhinitis due to pollen: Secondary | ICD-10-CM | POA: Diagnosis not present

## 2020-11-17 DIAGNOSIS — M1612 Unilateral primary osteoarthritis, left hip: Secondary | ICD-10-CM | POA: Diagnosis not present

## 2020-11-17 DIAGNOSIS — J3089 Other allergic rhinitis: Secondary | ICD-10-CM | POA: Diagnosis not present

## 2020-11-17 DIAGNOSIS — M25552 Pain in left hip: Secondary | ICD-10-CM | POA: Diagnosis not present

## 2020-11-19 ENCOUNTER — Other Ambulatory Visit: Payer: Self-pay | Admitting: Family Medicine

## 2020-11-19 DIAGNOSIS — R600 Localized edema: Secondary | ICD-10-CM

## 2020-11-19 NOTE — Telephone Encounter (Signed)
Requested Prescriptions  Pending Prescriptions Disp Refills  . furosemide (LASIX) 40 MG tablet [Pharmacy Med Name: FUROSEMIDE 40MG  TABLETS] 45 tablet 0    Sig: TAKE 1/2 TABLET BY MOUTH EVERY DAY AS NEEDED     Cardiovascular:  Diuretics - Loop Failed - 11/19/2020  3:20 AM      Failed - Cr in normal range and within 360 days    Creatinine, Ser  Date Value Ref Range Status  09/28/2020 1.25 (H) 0.57 - 1.00 mg/dL Final         Passed - K in normal range and within 360 days    Potassium  Date Value Ref Range Status  09/28/2020 5.2 3.5 - 5.2 mmol/L Final         Passed - Ca in normal range and within 360 days    Calcium  Date Value Ref Range Status  09/28/2020 10.0 8.7 - 10.3 mg/dL Final         Passed - Na in normal range and within 360 days    Sodium  Date Value Ref Range Status  09/28/2020 136 134 - 144 mmol/L Final         Passed - Last BP in normal range    BP Readings from Last 1 Encounters:  11/08/20 114/69         Passed - Valid encounter within last 6 months    Recent Outpatient Visits          1 month ago Annual physical exam   [facility] Jerrol Banana., MD   10 months ago Chronic maxillary sinusitis    [facility] [place]   1 year ago Annual physical exam   [facility] Jerrol Banana., MD   1 year ago Chronic venous insufficiency   [facility] Jerrol Banana., MD   2 years ago Annual physical exam   [facility] Jerrol Banana., MD      Future Appointments            In 10 months Jerrol Banana., MD [facility], [place]   In 11 months Adrian Prows, MD [facility]

## 2020-11-22 DIAGNOSIS — G4730 Sleep apnea, unspecified: Secondary | ICD-10-CM | POA: Diagnosis not present

## 2020-11-24 DIAGNOSIS — J301 Allergic rhinitis due to pollen: Secondary | ICD-10-CM | POA: Diagnosis not present

## 2020-11-24 DIAGNOSIS — J3089 Other allergic rhinitis: Secondary | ICD-10-CM | POA: Diagnosis not present

## 2020-11-29 DIAGNOSIS — J3089 Other allergic rhinitis: Secondary | ICD-10-CM | POA: Diagnosis not present

## 2020-11-29 DIAGNOSIS — J301 Allergic rhinitis due to pollen: Secondary | ICD-10-CM | POA: Diagnosis not present

## 2020-12-06 ENCOUNTER — Encounter: Payer: Self-pay | Admitting: Physical Therapy

## 2020-12-06 ENCOUNTER — Ambulatory Visit: Payer: BC Managed Care – PPO | Attending: Sports Medicine | Admitting: Physical Therapy

## 2020-12-06 ENCOUNTER — Other Ambulatory Visit: Payer: Self-pay

## 2020-12-06 VITALS — BP 139/60 | HR 72

## 2020-12-06 DIAGNOSIS — R262 Difficulty in walking, not elsewhere classified: Secondary | ICD-10-CM | POA: Insufficient documentation

## 2020-12-06 DIAGNOSIS — M25552 Pain in left hip: Secondary | ICD-10-CM | POA: Insufficient documentation

## 2020-12-06 DIAGNOSIS — J3089 Other allergic rhinitis: Secondary | ICD-10-CM | POA: Diagnosis not present

## 2020-12-06 DIAGNOSIS — J301 Allergic rhinitis due to pollen: Secondary | ICD-10-CM | POA: Diagnosis not present

## 2020-12-06 NOTE — Therapy (Signed)
[place] [facility] [address], [place], [address] Phone: [phone]   Fax:  [phone]  Physical Therapy Evaluation  Patient Details  Name: Cindy Hester MRN: [id number] Date of Birth: December 13, 1945 Referring Provider (PT): Rosalia Hammers   Encounter Date: 12/06/2020   PT End of Session - 12/07/20 1042     Visit Number 1    Number of Visits 16    Date for PT Re-Evaluation 01/31/21    Authorization - Visit Number 2    Authorization - Number of Visits 30    Progress Note Due on Visit 10    PT Start Time 0938    PT Stop Time 1800    PT Time Calculation (min) 45 min    Equipment Utilized During Treatment Gait belt    Activity Tolerance Patient tolerated treatment well    Behavior During Therapy WFL for tasks assessed/performed             Past Medical History:  Diagnosis Date   Arthritis    right shoulder blade, left thumb   Diabetes mellitus without complication (HCC)    GERD (gastroesophageal reflux disease)    Heart murmur    followed by PCP   High cholesterol    History of esophagitis    History of gastritis    HLD (hyperlipidemia)    Hypertension    Sleep apnea    CPAP    Past Surgical History:  Procedure Laterality Date   BREAST CYST EXCISION Left    removed two times   [place]     from left breast   ESOPHAGOGASTRODUODENOSCOPY (EGD) WITH PROPOFOL N/A 12/12/2015   gastritis, LA Grade A reflux esophagitis ESOPHAGOGASTRODUODENOSCOPY (EGD) WITH PROPOFOL;  Surgeon: Manya Silvas, MD;  Location: [facility] ENDOSCOPY;  Service: Endoscopy;  Laterality: N/A;  Diabetic   HAMMER TOE SURGERY Bilateral 05/30/2016   Procedure: HAMMER TOE CORRECTION RIGHT 2ND, 3RD, 4TH, 5TH TOES LEFT 5TH TOE;  Surgeon: Samara Deist, DPM;  Location: [facility];  Service: Podiatry;  Laterality: Bilateral;   HEEL SPUR SURGERY Right 2011   NASAL SINUS SURGERY     TONSILLECTOMY       Vitals:   12/06/20 1728  BP: 139/60  Pulse: 72  SpO2: 100%      Subjective Assessment - 12/06/20 1721     Subjective Pt reports having increase pain on right shoulder from having to catch herself with arm. She reports the left hip pain starting in July and that it has gotten progressively worse.    Pertinent History 11/17/20 [place] note   DANEILLE DESILVA is a 75 y.o. female that presents to clinic today for follow up evaluation and management of her left hip pain suspected to be due to her underlying left hip degenerative arthrosis. They were last evaluated by Cassell Smiles, [place] on 10/18/2020. At that time, the plan was to refer for a left intra-articular hip joint steroid injection. She had this performed on 10/27/2020. She reports that this provided 4 days of excellent symptom relief before her pains return. She is following up today for reevaluation and consideration of additional treatment options. She is quite frustrated because she had a right hip intra-articular steroid injection on 09/15/2019 and this hip continues to do well.    At the time of this visit, I reviewed her most recent labs from 10/19/2020 showing slight elevation to potassium but otherwise normal electrolytes, creatinine  1.1 with EGFR 49. Her last A1c was 6.8 on 04/20/2020.    Today, the patient reports their symptoms are mostly located around her anterior left hip. Her pain is intermittent and can come on with various activities and not always consistently. She describes issues with walking stairs, getting into a car, twisting on the leg. This will cause shooting pain that goes deep into the joint. She currently rates pain severity as a 4/10. She reports associated limping. She denies associated numbness or tingling, weakness, fevers or chills, night sweats, weight loss, skin color change, pain at night. She has been using acetaminophen mostly for her symptoms. She avoids NSAIDs due to her chronic kidney disease.    She is currently  working as a Education officer, museum.    Limitations Walking    How long can you sit comfortably? N/a - getting up from sitting bothers her    How long can you stand comfortably? A long time unless she moves wrong    How long can you walk comfortably? Moving wrong causes a sharp pain that takes her breath away    Diagnostic tests Anteroposterior view of the pelvis as well as anteroposterior and lateral   views of the left hip were obtained.  Images reveal mild loss of femoral   acetabular joint space with moderate loss of space noted centrally.  No   fractures or other osseous abnormality noted.  Osteophyte formation noted   of the superior acetabulum.    Patient Stated Goals No longer wants to have left hip pain    Currently in Pain? Yes    Pain Score 4     Pain Location Hip    Pain Orientation Left    Pain Descriptors / Indicators Aching    Pain Type Chronic pain    Pain Onset More than a month ago    Pain Frequency Intermittent    Aggravating Factors  Moving the wrong way, getting up from chair, going up stairs    Pain Relieving Factors N/a. Shot helped but only for 4 days    Effect of Pain on Daily Activities Moving in general                 HIp Eval    - Cauda Equina Syndrome: Negative to All  Bladder/bowel dysfunction - Saddle anesthesia - Sexual dysfunction -  Possible neurological deficits in the -  lower limb (motor or sensory loss, reflex change)   OBJECTIVE:   Observation: Gait: Antalgic gait with decrease stance time on LLE and decrease step length on RLE  Stairs: NT  Palpation  Lumbar AROM:         NORMS: Flexion:    40*              60 deg  Extension:   25           25 deg Sidebend R: 35             35 deg Sidebend L:   35            35 deg Rotation R:     45          45 deg Rotation L:      45         45 deg  Hip AROM:                 NORMS: Flexion R/L: 120/90*    120 deg Extension:     20/20  20 deg Abd:         40/40            40 deg ER:            45/45            45 deg IR:             45/30*           45 deg   Hip Strength:        R         L Hip flexion:            5/5       4/5 Hip abduction:      4/5       3+/5 Hip adduction:      4/5       3/5 Hip extension:      5/5       3+/5 Hip ER:                  5/5       4/5 Hip IR:                    5/5       4/5    Special Tests: Most Deferred due to pain   - Rule out SIJ (cluster - 3/5): Deferred  Thigh thrust              (+/-) Gaenslen's                (+/-) Distraction               (+/-) Compression               (+/-) Sacral thrust             (+/-)  - Rule out Lumbar Spine  Deferred  Slump Test                (+/-) SLR                       (+/-)  -  - Muscle Length Testing: Thomas test              NT  Ober's test               - B  Ely's Test                 + B   - Intra-articular pathology: Hip Scour test            NT  FABER test                NT   - Impingement/Labral tear: FADDIR test               NT  Flexion-IR test            NT   Functional Tests: Deferred due to pain  Squat Single Leg Stance Sit to stand: Difficulty due to pain   THEREX:   Prone Quad Stretch 2 x 60 sec  Supine Bridges 1 x 10  Supine Marches 1 x 10         PT Short Term Goals - 12/07/20 1457       PT SHORT TERM GOAL #1   Title Patient will be independent with hom exercise plan to self-manage condition.    Baseline 11/2:  NT    Time 2    Period Weeks    Status New    Target Date 12/21/20               PT Long Term Goals - 12/07/20 1458       PT LONG TERM GOAL #1   Title Patient will have improved function and activity level as evidenced by an increase in FOTO score by 10 points or more.    Baseline 11/1: 40/55    Time 8    Period Weeks    Status New    Target Date 01/31/21      PT LONG TERM GOAL #2   Title Patient will improve left hip AROM and PROM to >90 degrees flexion to improve functional tasks such as squatting to bend down to pick  up an object or standing up from a chair.    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    Baseline 11/1: L Hip Flexion 4/5 Abd 3+/5 Add 3/5, Ext 3+/5, Hip ER, Hip IR    Time 8    Period Weeks    Status New    Target Date 01/31/21                  Plan - 12/07/20 1046     Clinical Impression Statement Patient is a 75 yo female that presents for initial eval for left hip OA after continuing to experience pain after steriod injections. Her deficits included decreased left hip ROM and strength and increased left hip pain. Many of the tests were deferred due to patient's high pain state.  Pt will benefit from having her balance assessed given unsteadiness she presented with at evaluation. She will also benefit from skilled PT to increase left hip strength and ROM to decrease her left hip pain and improve her ability to assume weight bearing positions for her job and to negotiate stairs inside her home.    Personal Factors and Comorbidities Age;Comorbidity 3+    Comorbidities Diabetes, h/o of right knee and right hip OA, HTN, HLD    Examination-Activity Limitations Bend;Locomotion Level;Stairs;Squat;Stand    Examination-Participation Restrictions Cleaning;Other;Occupation   Any activity that requires movement   Stability/Clinical Decision Making Stable/Uncomplicated    Clinical Decision Making Low    Clinical Presentation due to: Increased pain resulted in limited testing and deferrment of many tests    Rehab Potential Fair    Clinical Impairments Affecting Rehab Potential H/o OA and limited response to steriod injections    PT Frequency 2x / week    PT Duration 8 weeks    PT  Treatment/Interventions Neuromuscular re-education;Balance training;Therapeutic exercise;Therapeutic activities;Joint Manipulations;Spinal Manipulations;Moist Heat;Electrical Stimulation;Cryotherapy;DME Instruction;Dry needling;Passive range of motion;Patient/family education    PT Next Visit Plan Finish functional assessment: Squat, stairs, and gait. Trial AD    Consulted and Agree with Plan of Care Patient            HEP includes the following:   Access Code: [email or web address] URL: [email or web address] Date: 12/06/2020 Prepared by: Bradly Chris  Exercises Supine Bridge - 1 x daily -  3 x weekly - 2 sets - 10 reps Prone Quadriceps Stretch with Strap - 1 x daily - 7 x weekly - 1 sets - 3 reps - 30 hold Supine March - 1 x daily - 3 x weekly - 2 sets - 10 reps    Patient will benefit from skilled therapeutic intervention in order to improve the following deficits and impairments:  Abnormal gait, Decreased strength, Hypomobility, Pain, Impaired flexibility  Visit Diagnosis: Pain in left hip  Difficulty in walking, not elsewhere classified     Problem List Patient Active Problem List   Diagnosis Date Noted   Chronic venous insufficiency 07/03/2017   Lymphedema 07/03/2017   Bilateral lower extremity edema 05/14/2017   Claudication of left lower extremity ([place]) 05/14/2017   Allergic rhinitis 07/02/2014   Diabetes ([place]) 07/02/2014   Genital herpes 07/02/2014   HLD (hyperlipidemia) 07/02/2014   Adult hypothyroidism 07/02/2014   Arthritis, degenerative 07/02/2014   Adiposity 07/02/2014   Obstructive apnea 07/02/2014   Avitaminosis D 07/02/2014   Tendonitis, deQuervains 11/07/2010   Mucous cyst of toe 11/07/2010   MEDIAL MENISCUS TEAR, LEFT 06/29/2008   KNEE PAIN, LEFT 06/01/2008   TRIGGER FINGER, THUMB 04/20/2008   AODM 02/03/2008   ESSENTIAL HYPERTENSION, BENIGN 02/03/2008   ACHILLES BURSITIS OR TENDINITIS 02/03/2008   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 [place], [address] Phone: [phone]   Fax:  [phone]  Name: Cindy Hester MRN: [id number] Date of Birth: 1945-05-03

## 2020-12-13 ENCOUNTER — Ambulatory Visit: Payer: BC Managed Care – PPO | Admitting: Physical Therapy

## 2020-12-13 DIAGNOSIS — R262 Difficulty in walking, not elsewhere classified: Secondary | ICD-10-CM | POA: Diagnosis not present

## 2020-12-13 DIAGNOSIS — M25552 Pain in left hip: Secondary | ICD-10-CM | POA: Diagnosis not present

## 2020-12-13 DIAGNOSIS — J3089 Other allergic rhinitis: Secondary | ICD-10-CM | POA: Diagnosis not present

## 2020-12-13 DIAGNOSIS — J301 Allergic rhinitis due to pollen: Secondary | ICD-10-CM | POA: Diagnosis not present

## 2020-12-13 NOTE — Therapy (Signed)
[facility] [address], [place], [address] Phone: [phone]   Fax:  [phone]  Physical Therapy Treatment  Patient Details  Name: Cindy Hester MRN: [id number] Date of Birth: 07/19/1945 Referring Provider (PT): Rosalia Hammers   Encounter Date: 12/13/2020   PT End of Session - 12/13/20 1735     Visit Number 2    Number of Visits 16    Date for PT Re-Evaluation 01/31/21    Authorization - Visit Number 3    Authorization - Number of Visits 30    Progress Note Due on Visit 10    PT Start Time 1720    PT Stop Time 1800    PT Time Calculation (min) 40 min    Equipment Utilized During Treatment Gait belt    Activity Tolerance Patient tolerated treatment well    Behavior During Therapy WFL for tasks assessed/performed             Past Medical History:  Diagnosis Date   Arthritis    right shoulder blade, left thumb   Diabetes mellitus without complication (HCC)    GERD (gastroesophageal reflux disease)    Heart murmur    followed by PCP   High cholesterol    History of esophagitis    History of gastritis    HLD (hyperlipidemia)    Hypertension    Sleep apnea    CPAP    Past Surgical History:  Procedure Laterality Date   BREAST CYST EXCISION Left    removed two times   [place]     from left breast   ESOPHAGOGASTRODUODENOSCOPY (EGD) WITH PROPOFOL N/A 12/12/2015   gastritis, LA Grade A reflux esophagitis ESOPHAGOGASTRODUODENOSCOPY (EGD) WITH PROPOFOL;  Surgeon: Manya Silvas, MD;  Location: [facility] ENDOSCOPY;  Service: Endoscopy;  Laterality: N/A;  Diabetic   HAMMER TOE SURGERY Bilateral 05/30/2016   Procedure: HAMMER TOE CORRECTION RIGHT 2ND, 3RD, 4TH, 5TH TOES LEFT 5TH TOE;  Surgeon: Samara Deist, DPM;  Location: [facility];  Service: Podiatry;  Laterality: Bilateral;   HEEL SPUR SURGERY Right 2011   NASAL SINUS SURGERY     TONSILLECTOMY       There were no vitals filed for this visit.   Subjective Assessment - 12/13/20 1729     Subjective Pt reports not being able to her HEP because of increase left hip pain. She continues experience unsteadiness when experiencing sudden bouts of left hip pain from arthritis.    Pertinent History 11/17/20 [place]inski note   Cindy Hester is a 75 y.o. female that presents to clinic today for follow up evaluation and management of her left hip pain suspected to be due to her underlying left hip degenerative arthrosis. They were last evaluated by Cassell Smiles, [place] on 10/18/2020. At that time, the plan was to refer for a left intra-articular hip joint steroid injection. She had this performed on 10/27/2020. She reports that this provided 4 days of excellent symptom relief before her pains return. She is following up today for reevaluation and consideration of additional treatment options. She is quite frustrated because she had a right hip intra-articular steroid injection on 09/15/2019 and this hip continues to do well.    At the time of this visit, I reviewed her most recent labs from 10/19/2020 showing slight elevation to potassium but otherwise normal electrolytes, creatinine 1.1 with EGFR 49. Her last A1c was 6.8 on 04/20/2020.  Today, the patient reports their symptoms are mostly located around her anterior left hip. Her pain is intermittent and can come on with various activities and not always consistently. She describes issues with walking stairs, getting into a car, twisting on the leg. This will cause shooting pain that goes deep into the joint. She currently rates pain severity as a 4/10. She reports associated limping. She denies associated numbness or tingling, weakness, fevers or chills, night sweats, weight loss, skin color change, pain at night. She has been using acetaminophen mostly for her symptoms. She avoids NSAIDs due to her chronic kidney disease.    She is currently working as a Education officer, museum.     Limitations Walking    How long can you sit comfortably? N/a - getting up from sitting bothers her    How long can you stand comfortably? A long time unless she moves wrong    How long can you walk comfortably? Moving wrong causes a sharp pain that takes her breath away    Diagnostic tests Anteroposterior view of the pelvis as well as anteroposterior and lateral   views of the left hip were obtained.  Images reveal mild loss of femoral   acetabular joint space with moderate loss of space noted centrally.  No   fractures or other osseous abnormality noted.  Osteophyte formation noted   of the superior acetabulum.    Patient Stated Goals No longer wants to have left hip pain    Currently in Pain? Yes    Pain Score 4     Pain Location Hip    Pain Orientation Left    Pain Descriptors / Indicators Aching    Pain Type Chronic pain    Pain Onset More than a month ago    Pain Frequency Intermittent            MANUAL THERAPY:  Indirect Longitudinal Hip Distraction Grade III-IV 30 sec x 6 -Pt reports no relief in left hip and increased pain around left ankle   Inferior Hip Glide Grade III-IV x 10  -No symptom relief   THEREX:    Seated AAROM Hip  Flexion  -min VC to only lift hip to when she begins to feel pain   Seated Quad Stretch 2 x 30 sec  - min VC on how to sequence exercise   Seated Hamstring Stretch 2 x 30 sec  -min VC to place pressure over knee   Long Sitting HS stretch 2 x 30 sec    Updated HEP and educated patient on changes to exercises and addition of new exercises to include seated HS stretch, seated quad stretch, and seated AAROM hip flexion exercise.          PT Education - 12/13/20 1734     Education Details form and technique for appropriate exercise    Person(s) Educated Patient    Methods Explanation;Handout;Demonstration;Verbal cues    Comprehension Verbalized understanding;Returned demonstration;Verbal cues required              PT Short  Term Goals - 12/14/20 0856       PT SHORT TERM GOAL #1   Title Patient will be independent with hom exercise plan to self-manage condition.    Baseline 11/2: NT    Time 2    Period Weeks    Status On-going    Target Date 12/21/20               PT Long Term Goals - 12/14/20  0856       PT LONG TERM GOAL #1   Title Patient will have improved function and activity level as evidenced by an increase in FOTO score by 10 points or more.    Baseline 11/1: 40/55    Time 8    Period Weeks    Status On-going      PT LONG TERM GOAL #2   Title Patient will improve left hip AROM and PROM to >90 degrees flexion to improve functional tasks such as squatting to bend down to pick up an object or standing up from a chair.    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    Baseline 11/1: L Hip Flexion 4/5 Abd 3+/5 Add 3/5, Ext 3+/5, Hip ER, Hip IR    Time 8    Period Weeks    Status On-going                   Plan - 12/13/20 1735     Clinical Impression Statement Pt presents for f/u for left hip and knee pain 2/2 to severe left hip OA. She continues to experience increased hip pain that is limiting the exercises that she can perform during session especially left hip flexion >90 degrees. Exercises modified according so that pt can perform within tolerable pain range. She exhibits unsteadiness when ambulating which is due to left hip pain and will likely improve with decrease in left hip pain. PT recommended use of AD to increase steadiness when ambulating. Pt did not demonstrate benefit of manual therapy to decrease left hip pain to perform exercises.  She will continue to benefit from skilled PT to increase left hip  strength and ROM to decrease her left hip pain and improve her ability to assume weight bearing positions for her job and to negotiate stairs inside her home.    Personal Factors and Comorbidities Age;Comorbidity 3+    Comorbidities Diabetes, h/o of right knee and right hip OA, HTN, HLD    Examination-Activity Limitations Bend;Locomotion Level;Stairs;Squat;Stand    Examination-Participation Restrictions Cleaning;Other;Occupation   Any activity that requires movement   Stability/Clinical Decision Making Stable/Uncomplicated    Rehab Potential Fair    Clinical Impairments Affecting Rehab Potential H/o OA and limited response to steriod injections    PT Frequency 2x / week    PT Duration 8 weeks    PT Treatment/Interventions Neuromuscular re-education;Balance training;Therapeutic exercise;Therapeutic activities;Joint Manipulations;Spinal Manipulations;Moist Heat;Electrical Stimulation;Cryotherapy;DME Instruction;Dry needling;Passive range of motion;Patient/family education    PT Next Visit Plan Static balance exercises. Try AD such as SPC. Attempt hip distraction with different hold and progress hip ROM and stretching exercises.    PT Home Exercise Plan Seated Hamstring Stretch - 1 x daily - 7 x weekly - 1 sets - 3 reps - 60 hold  Seated Table Hamstring Stretch - 1 x daily - 7 x weekly - 1 sets - 3 reps - 60 hold  Seated Hip Flexion AAROM 3 x 10 x 3 days per week   Seated Quad Stretch 1 x 7 x 60 sec    Consulted and Agree with Plan of Care Patient  HEP includes the following:  Access Code: [email or web address] URL: [email or web address] Date: 12/13/2020 Prepared by: Bradly Chris  Exercises Seated Hamstring Stretch - 1 x daily - 7 x weekly - 1 sets - 3 reps - 60 hold Seated Table Hamstring Stretch - 1 x daily - 7 x weekly - 1 sets - 3 reps - 60 hold Seated Hip Flexion AAROM 3 x 10 x 3 days per week  Seated Quad Stretch 1 x 7 x 60 sec      Patient will benefit from  skilled therapeutic intervention in order to improve the following deficits and impairments:  Abnormal gait, Decreased strength, Hypomobility, Pain, Impaired flexibility  Visit Diagnosis: Pain in left hip  Difficulty in walking, not elsewhere classified     Problem List Patient Active Problem List   Diagnosis Date Noted   Chronic venous insufficiency 07/03/2017   Lymphedema 07/03/2017   Bilateral lower extremity edema 05/14/2017   Claudication of left lower extremity ([place]) 05/14/2017   Allergic rhinitis 07/02/2014   Diabetes ([place]) 07/02/2014   Genital herpes 07/02/2014   HLD (hyperlipidemia) 07/02/2014   Adult hypothyroidism 07/02/2014   Arthritis, degenerative 07/02/2014   Adiposity 07/02/2014   Obstructive apnea 07/02/2014   Avitaminosis D 07/02/2014   Tendonitis, deQuervains 11/07/2010   Mucous cyst of toe 11/07/2010   MEDIAL MENISCUS TEAR, LEFT 06/29/2008   KNEE PAIN, LEFT 06/01/2008   TRIGGER FINGER, THUMB 04/20/2008   AODM 02/03/2008   ESSENTIAL HYPERTENSION, BENIGN 02/03/2008   ACHILLES BURSITIS OR TENDINITIS 02/03/2008   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 [place], [address] Phone: [phone]   Fax:  [phone]  Name: Cindy Hester MRN: [id number] Date of Birth: 1945-07-09

## 2020-12-14 ENCOUNTER — Encounter: Payer: Self-pay | Admitting: Physical Therapy

## 2020-12-15 ENCOUNTER — Ambulatory Visit: Payer: BC Managed Care – PPO | Admitting: Dermatology

## 2020-12-20 ENCOUNTER — Ambulatory Visit: Payer: BC Managed Care – PPO | Admitting: Physical Therapy

## 2020-12-20 DIAGNOSIS — R262 Difficulty in walking, not elsewhere classified: Secondary | ICD-10-CM

## 2020-12-20 DIAGNOSIS — M25552 Pain in left hip: Secondary | ICD-10-CM | POA: Diagnosis not present

## 2020-12-20 NOTE — Therapy (Signed)
[facility] [address], [place], [address] Phone: [phone]   Fax:  [phone]  Physical Therapy Treatment  Patient Details  Name: Cindy Hester MRN: [id number] Date of Birth: 07/14/1945 Referring Provider (PT): Rosalia Hammers   Encounter Date: 12/20/2020   PT End of Session - 12/21/20 1315     Visit Number 3    Number of Visits 16    Date for PT Re-Evaluation 01/31/21    Authorization - Visit Number 3    Authorization - Number of Visits 30    Progress Note Due on Visit 10    PT Start Time 8850    PT Stop Time 1800    PT Time Calculation (min) 30 min    Equipment Utilized During Treatment Gait belt    Activity Tolerance Patient tolerated treatment well    Behavior During Therapy WFL for tasks assessed/performed             Past Medical History:  Diagnosis Date   Arthritis    right shoulder blade, left thumb   Diabetes mellitus without complication (HCC)    GERD (gastroesophageal reflux disease)    Heart murmur    followed by PCP   High cholesterol    History of esophagitis    History of gastritis    HLD (hyperlipidemia)    Hypertension    Sleep apnea    CPAP    Past Surgical History:  Procedure Laterality Date   BREAST CYST EXCISION Left    removed two times   [place]     from left breast   ESOPHAGOGASTRODUODENOSCOPY (EGD) WITH PROPOFOL N/A 12/12/2015   gastritis, LA Grade A reflux esophagitis ESOPHAGOGASTRODUODENOSCOPY (EGD) WITH PROPOFOL;  Surgeon: Manya Silvas, MD;  Location: [facility] ENDOSCOPY;  Service: Endoscopy;  Laterality: N/A;  Diabetic   HAMMER TOE SURGERY Bilateral 05/30/2016   Procedure: HAMMER TOE CORRECTION RIGHT 2ND, 3RD, 4TH, 5TH TOES LEFT 5TH TOE;  Surgeon: Samara Deist, DPM;  Location: [facility];  Service: Podiatry;  Laterality: Bilateral;   HEEL SPUR SURGERY Right 2011   NASAL SINUS SURGERY     TONSILLECTOMY       There were no vitals filed for this visit.   Subjective Assessment - 12/20/20 1739     Subjective Pt presents 30 min late and reports that her pain still tends to get worse after getting up from sitting position and that it happens sporadically. She also mentions that her fingers will not bend and it feels like a muscle cramp.    Pertinent History 11/17/20 [place]inski note   Cindy Hester is a 75 y.o. female that presents to clinic today for follow up evaluation and management of her left hip pain suspected to be due to her underlying left hip degenerative arthrosis. They were last evaluated by Cassell Smiles, [place] on 10/18/2020. At that time, the plan was to refer for a left intra-articular hip joint steroid injection. She had this performed on 10/27/2020. She reports that this provided 4 days of excellent symptom relief before her pains return. She is following up today for reevaluation and consideration of additional treatment options. She is quite frustrated because she had a right hip intra-articular steroid injection on 09/15/2019 and this hip continues to do well.    At the time of this visit, I reviewed her most recent labs from 10/19/2020 showing slight elevation to potassium but otherwise normal  electrolytes, creatinine 1.1 with EGFR 49. Her last A1c was 6.8 on 04/20/2020.    Today, the patient reports their symptoms are mostly located around her anterior left hip. Her pain is intermittent and can come on with various activities and not always consistently. She describes issues with walking stairs, getting into a car, twisting on the leg. This will cause shooting pain that goes deep into the joint. She currently rates pain severity as a 4/10. She reports associated limping. She denies associated numbness or tingling, weakness, fevers or chills, night sweats, weight loss, skin color change, pain at night. She has been using acetaminophen mostly for her symptoms. She avoids NSAIDs due to her chronic kidney  disease.    She is currently working as a Education officer, [employer].    Limitations Walking    How long can you sit comfortably? N/a - getting up from sitting bothers her    How long can you stand comfortably? A long time unless she moves wrong    How long can you walk comfortably? Moving wrong causes a sharp pain that takes her breath away    Diagnostic tests Anteroposterior view of the pelvis as well as anteroposterior and lateral   views of the left hip were obtained.  Images reveal mild loss of femoral   acetabular joint space with moderate loss of space noted centrally.  No   fractures or other osseous abnormality noted.  Osteophyte formation noted   of the superior acetabulum.    Patient Stated Goals No longer wants to have left hip pain    Currently in Pain? No/denies    Pain Onset More than a month ago             THEREX:   Left Knee AROM slides with sliders in seated 3 x 10 Seated Hip Marches with LLE 3 x 10  Supine Bridges 3 x 10   Discussion about use of SPC given pt's balance deficits from left hip pain. -Pt agrees to only use when she is experiencing severe left hip OA    Updated HEP and educated patient on changes to exercises and addition of new exercises seated hip marches and supine bridges.             PT Education - 12/21/20 1314     Education provided Yes    Education Details form and technique for appropriate exercise    Person(s) Educated Patient    Methods Explanation;Demonstration;Verbal cues;Handout    Comprehension Verbalized understanding;Verbal cues required;Returned demonstration              PT Short Term Goals - 12/21/20 1319       PT SHORT TERM GOAL #1   Title Patient will be independent with hom exercise plan to self-manage condition.    Baseline 11/2: NT    Time 2    Period Weeks    Status On-going    Target Date 12/21/20               PT Long Term Goals - 12/21/20 1319       PT LONG TERM GOAL #1   Title Patient will have  improved function and activity level as evidenced by an increase in FOTO score by 10 points or more.    Baseline 11/1: 40/55    Time 8    Period Weeks    Status On-going      PT LONG TERM GOAL #2   Title Patient will improve left hip  AROM and PROM to >90 degrees flexion to improve functional tasks such as squatting to bend down to pick up an object or standing up from a chair.    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    Baseline 11/1: L Hip Flexion 4/5 Abd 3+/5 Add 3/5, Ext 3+/5, Hip ER, Hip IR    Time 8    Period Weeks    Status On-going                   Plan - 12/21/20 1316     Clinical Impression Statement Pt presents for f/u for left hip and knee pain 2/2 to severe left hip OA. Pt demonstrates a signficant improvement in her left hip pain response to movement with her ability to perform left hip flexion AROM exercises and assume positions that require increase left hip flexion. It is unclear what led to improvement in pain, and this is a recent developement. PT inquired about whether pt would be interested in aquatic therapy as an alternative if she continued to show limited progress in PT. Pt reports that she is unable to make aquatic apts because of work.  She will continue to benefit from skilled PT to increase left hip strength and ROM to decrease her left hip pain and improve her ability to assume weight bearing positions for her job and to negotiate stairs inside her home.    Personal Factors and Comorbidities Age;Comorbidity 3+    Comorbidities Diabetes, h/o of right knee and right hip OA, HTN, HLD    Examination-Activity Limitations Bend;Locomotion Level;Stairs;Squat;Stand     Examination-Participation Restrictions Cleaning;Other;Occupation   Any activity that requires movement   Stability/Clinical Decision Making Stable/Uncomplicated    Clinical Decision Making Low    Rehab Potential Fair    Clinical Impairments Affecting Rehab Potential H/o OA and limited response to steriod injections    PT Frequency 2x / week    PT Duration 8 weeks    PT Treatment/Interventions Neuromuscular re-education;Balance training;Therapeutic exercise;Therapeutic activities;Joint Manipulations;Spinal Manipulations;Moist Heat;Electrical Stimulation;Cryotherapy;DME Instruction;Dry needling;Passive range of motion;Patient/family education    PT Next Visit Plan Continue with AROM hip flexion exercises and attempt left hip distraction    PT Home Exercise Plan Seated Hamstring Stretch - 1 x daily - 7 x weekly - 1 sets - 3 reps - 60 hold  Seated Table Hamstring Stretch - 1 x daily - 7 x weekly - 1 sets - 3 reps - 60 hold  Seated Hip Flexion AAROM 3 x 10 x 3 days per week   Seated Quad Stretch 1 x 7 x 60 sec    Consulted and Agree with Plan of Care Patient            HEP includes the following:  Access Code: [email or web address] URL: [email or web address] Date: 12/20/2020 Prepared by: Bradly Chris  Exercises Supine Bridge - 1 x daily - 3 x weekly - 3 sets - 10 reps Seated Hip Flexion - 1 x daily - 3 x weekly - 3 sets - 10 reps Seated Hamstring Stretch - 1 x daily - 7 x weekly - 1 sets - 3 reps -  60 hold  Seated Quad Stretch 1 x 7 weekly x 3 reps x hold 60 sec       Patient will benefit from skilled therapeutic intervention in order to improve the following deficits and impairments:  Abnormal gait, Decreased strength, Hypomobility, Pain, Impaired flexibility  Visit Diagnosis: Pain in left hip  Difficulty in walking, not elsewhere classified     Problem List Patient Active Problem List   Diagnosis Date Noted   Chronic venous insufficiency 07/03/2017   Lymphedema  07/03/2017   Bilateral lower extremity edema 05/14/2017   Claudication of left lower extremity ([place]) 05/14/2017   Allergic rhinitis 07/02/2014   Diabetes ([place]) 07/02/2014   Genital herpes 07/02/2014   HLD (hyperlipidemia) 07/02/2014   Adult hypothyroidism 07/02/2014   Arthritis, degenerative 07/02/2014   Adiposity 07/02/2014   Obstructive apnea 07/02/2014   Avitaminosis D 07/02/2014   Tendonitis, deQuervains 11/07/2010   Mucous cyst of toe 11/07/2010   MEDIAL MENISCUS TEAR, LEFT 06/29/2008   KNEE PAIN, LEFT 06/01/2008   TRIGGER FINGER, THUMB 04/20/2008   AODM 02/03/2008   ESSENTIAL HYPERTENSION, BENIGN 02/03/2008   ACHILLES BURSITIS OR TENDINITIS 02/03/2008   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, [place], [address] Phone: [phone]   Fax:  [phone]  Name: TAMBRA MULLER MRN: [id number] Date of Birth: 1945/03/28

## 2020-12-21 ENCOUNTER — Ambulatory Visit (INDEPENDENT_AMBULATORY_CARE_PROVIDER_SITE_OTHER): Payer: Self-pay | Admitting: Dermatology

## 2020-12-21 ENCOUNTER — Other Ambulatory Visit: Payer: Self-pay

## 2020-12-21 DIAGNOSIS — L988 Other specified disorders of the skin and subcutaneous tissue: Secondary | ICD-10-CM

## 2020-12-21 NOTE — Patient Instructions (Signed)

## 2020-12-21 NOTE — Progress Notes (Signed)
   Follow-Up Visit   Subjective  Cindy Hester is a 75 y.o. female who presents for the following: Facial Elastosis (Pt here for Botox and fillers today ).  The following portions of the chart were reviewed this encounter and updated as appropriate:   Tobacco  Allergies  Meds  Problems  Med Hx  Surg Hx  Fam Hx     Review of Systems:  No other skin or systemic complaints except as noted in HPI or Assessment and Plan.  Objective  Well appearing patient in no apparent distress; mood and affect are within normal limits.  A focused examination was performed including face. Relevant physical exam findings are noted in the Assessment and Plan.  face Rhytides and volume loss.       Assessment & Plan  Elastosis of skin face  Botox today - 40 units total - 32.5 units to frown complex, 7.5 units to chin - May consider DAO injections in the future  Restylane Defyne to nasolabial area and marionette areas - 1 cc total - may consider filler for dimples in the future.  Filling material injection - face Location: See attached image  Informed consent: Discussed risks (infection, pain, bleeding, bruising, swelling, allergic reaction, paralysis of nearby muscles, eyelid droop, double vision, neck weakness, difficulty breathing, headache, undesirable cosmetic result, and need for additional treatment) and benefits of the procedure, as well as the alternatives.  Informed consent was obtained.  Preparation: The area was cleansed with alcohol.  Procedure Details:  Botox was injected into the dermis with a 30-gauge needle. Pressure applied to any bleeding. Ice packs offered for swelling.  Lot Number:  [id number] Expiration:  09/2022  Total Units Injected:  40  Plan: Patient was instructed to remain upright for 4 hours. Patient was instructed to avoid massaging the face and avoid vigorous exercise for the rest of the day. Tylenol may be used for headache.  Allow 2 weeks before returning to  clinic for additional dosing as needed. Patient will call for any problems.   Filling material injection - face Prior to the procedure, the patient's past medical history, allergies and the rare but potential risks and complications were reviewed with the patient and a signed consent was obtained. Pre and post-treatment care was discussed and instructions provided.  Location: nasolabial folds and marionette lines  Filler Type: Restylane Defyne Lot #[id number] Exp 03/07/2022  Procedure: The area was prepped thoroughly with Puracyn. After introducing the needle into the desired treatment area, the syringe plunger was drawn back to ensure there was no flash of blood prior to injecting the filler in order to minimize risk of intravascular injection and vascular occlusion. After injection of the filler, the treated areas were cleansed and iced to reduce swelling. Post-treatment instructions were reviewed with the patient.       Patient tolerated the procedure well. The patient will call with any problems, questions or concerns prior to their next appointment.   Return for 3.5 months for botox and possible filler.  IMarye Round, CMA, am acting as scribe for Sarina Ser, MD .  Documentation: I have reviewed the above documentation for accuracy and completeness, and I agree with the above.  Sarina Ser, MD

## 2020-12-23 DIAGNOSIS — J3089 Other allergic rhinitis: Secondary | ICD-10-CM | POA: Diagnosis not present

## 2020-12-26 ENCOUNTER — Encounter: Payer: Self-pay | Admitting: Dermatology

## 2020-12-27 ENCOUNTER — Ambulatory Visit: Payer: BC Managed Care – PPO | Admitting: Physical Therapy

## 2020-12-27 DIAGNOSIS — J3089 Other allergic rhinitis: Secondary | ICD-10-CM | POA: Diagnosis not present

## 2021-01-01 ENCOUNTER — Other Ambulatory Visit: Payer: Self-pay | Admitting: Cardiology

## 2021-01-01 DIAGNOSIS — I6523 Occlusion and stenosis of bilateral carotid arteries: Secondary | ICD-10-CM

## 2021-01-03 ENCOUNTER — Ambulatory Visit: Payer: BC Managed Care – PPO | Admitting: Physical Therapy

## 2021-01-05 ENCOUNTER — Other Ambulatory Visit: Payer: Self-pay | Admitting: Family Medicine

## 2021-01-05 DIAGNOSIS — K121 Other forms of stomatitis: Secondary | ICD-10-CM

## 2021-01-10 DIAGNOSIS — R87810 Cervical high risk human papillomavirus (HPV) DNA test positive: Secondary | ICD-10-CM | POA: Diagnosis not present

## 2021-01-10 DIAGNOSIS — J3089 Other allergic rhinitis: Secondary | ICD-10-CM | POA: Diagnosis not present

## 2021-01-10 DIAGNOSIS — Z124 Encounter for screening for malignant neoplasm of cervix: Secondary | ICD-10-CM | POA: Diagnosis not present

## 2021-01-10 DIAGNOSIS — Z87410 Personal history of cervical dysplasia: Secondary | ICD-10-CM | POA: Diagnosis not present

## 2021-01-16 ENCOUNTER — Ambulatory Visit: Payer: BC Managed Care – PPO | Attending: Sports Medicine | Admitting: Physical Therapy

## 2021-01-16 DIAGNOSIS — M25552 Pain in left hip: Secondary | ICD-10-CM | POA: Insufficient documentation

## 2021-01-16 DIAGNOSIS — R262 Difficulty in walking, not elsewhere classified: Secondary | ICD-10-CM | POA: Diagnosis not present

## 2021-01-16 NOTE — Therapy (Signed)
[facility] [address], [place], [address] Phone: [phone]   Fax:  [phone]  Physical Therapy Treatment  Patient Details  Name: [name] MRN: [id number] Date of Birth: 1945/06/14 Referring Provider (PT): Rosalia Hammers   Encounter Date: 01/16/2021   PT End of Session - 01/16/21 1730     Visit Number 4    Number of Visits 16    Date for PT Re-Evaluation 01/31/21    Authorization - Visit Number 4    Authorization - Number of Visits 30    Progress Note Due on Visit 10    PT Start Time 7948    PT Stop Time 1800    PT Time Calculation (min) 45 min    Equipment Utilized During Treatment Gait belt    Activity Tolerance Patient tolerated treatment well    Behavior During Therapy WFL for tasks assessed/performed             Past Medical History:  Diagnosis Date   Arthritis    right shoulder blade, left thumb   Diabetes mellitus without complication (HCC)    GERD (gastroesophageal reflux disease)    Heart murmur    followed by PCP   High cholesterol    History of esophagitis    History of gastritis    HLD (hyperlipidemia)    Hypertension    Sleep apnea    CPAP    Past Surgical History:  Procedure Laterality Date   BREAST CYST EXCISION Left    removed two times   [place]     from left breast   ESOPHAGOGASTRODUODENOSCOPY (EGD) WITH PROPOFOL N/A 12/12/2015   gastritis, [place] Grade A reflux esophagitis ESOPHAGOGASTRODUODENOSCOPY (EGD) WITH PROPOFOL;  Surgeon: Manya Silvas, MD;  Location: [facility] ENDOSCOPY;  Service: Endoscopy;  Laterality: N/A;  Diabetic   HAMMER TOE SURGERY Bilateral 05/30/2016   Procedure: HAMMER TOE CORRECTION RIGHT 2ND, 3RD, 4TH, 5TH TOES LEFT 5TH TOE;  Surgeon: Samara Deist, DPM;  Location: [facility];  Service: Podiatry;  Laterality: Bilateral;   HEEL SPUR SURGERY Right 2011   NASAL SINUS SURGERY     TONSILLECTOMY       There were no vitals filed for this visit.   Subjective Assessment - 01/16/21 1731     Subjective Pt reports falling after bending over to pickup something and she fell onto her right hip. Her left hip pain has come back again after stepping the wrong way after getting out of her car.    Pertinent History 11/17/20 [facility] note   Cindy Hester is a 75 y.o. female that presents to clinic today for follow up evaluation and management of her left hip pain suspected to be due to her underlying left hip degenerative arthrosis. They were last evaluated by Cassell Smiles, [place] on 10/18/2020. At that time, the plan was to refer for a left intra-articular hip joint steroid injection. She had this performed on 10/27/2020. She reports that this provided 4 days of excellent symptom relief before her pains return. She is following up today for reevaluation and consideration of additional treatment options. She is quite frustrated because she had a right hip intra-articular steroid injection on 09/15/2019 and this hip continues to do well.    At the time of this visit, I reviewed her most recent labs from 10/19/2020 showing slight elevation to potassium but otherwise normal electrolytes, creatinine 1.1 with EGFR 49. Her  last A1c was 6.8 on 04/20/2020.    Today, the patient reports their symptoms are mostly located around her anterior left hip. Her pain is intermittent and can come on with various activities and not always consistently. She describes issues with walking stairs, getting into a car, twisting on the leg. This will cause shooting pain that goes deep into the joint. She currently rates pain severity as a 4/10. She reports associated limping. She denies associated numbness or tingling, weakness, fevers or chills, night sweats, weight loss, skin color change, pain at night. She has been using acetaminophen mostly for her symptoms. She avoids NSAIDs due to her chronic kidney disease.    She is currently working as a  Education officer, museum.    Limitations Walking    How long can you sit comfortably? N/a - getting up from sitting bothers her    How long can you stand comfortably? A long time unless she moves wrong    How long can you walk comfortably? Moving wrong causes a sharp pain that takes her breath away    Diagnostic tests Anteroposterior view of the pelvis as well as anteroposterior and lateral   views of the left hip were obtained.  Images reveal mild loss of femoral   acetabular joint space with moderate loss of space noted centrally.  No   fractures or other osseous abnormality noted.  Osteophyte formation noted   of the superior acetabulum.    Patient Stated Goals No longer wants to have left hip pain    Currently in Pain? Yes    Pain Score 9     Pain Location Hip    Pain Orientation Left    Pain Descriptors / Indicators Aching    Pain Type Chronic pain    Pain Onset More than a month ago             THEREX:   Nu-Step 10 min at   [facility] Right Knee Flex 3 x 10   Seated HS Stretch 2 x 30 sec   Long Sitting HS Stretch 2 x 30 sec    MANUAL:   Long Axis Distraction on Left Hip 30 sec x 5     No updates to exercises given limited compliance.       PT Education - 01/16/21 1729     Education provided Yes    Education Details form and technique for appropriate exercise    Person(s) Educated Patient    Methods Explanation;Demonstration;Verbal cues;Handout    Comprehension Verbalized understanding;Returned demonstration;Verbal cues required              PT Short Term Goals - 01/17/21 1211       PT SHORT TERM GOAL #1   Title Patient will be independent with hom exercise plan to self-manage condition.    Baseline 11/2: NT    Time 2    Period Weeks    Status On-going    Target Date 12/21/20               PT Long Term Goals - 01/17/21 1211       PT LONG TERM GOAL #1   Title Patient will have improved function and activity level as evidenced by an increase in FOTO  score by 10 points or more.    Baseline 11/1: 40/55    Time 8    Period Weeks    Status On-going      PT LONG TERM GOAL #2   Title Patient  will improve left hip AROM and PROM to >90 degrees flexion to improve functional tasks such as squatting to bend down to pick up an object or standing up from a chair.    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    Baseline 11/1: L Hip Flexion 4/5 Abd 3+/5 Add 3/5, Ext 3+/5, Hip ER, Hip IR    Time 8    Period Weeks    Status On-going                   Plan - 01/17/21 1205     Clinical Impression Statement Pt presents for f/u for left hip and knee pain 2/2 to severe left hip OA. Pt has regressed with pain response and now experiences severe left hip and knee pain with movement. Hip distraction did not resolve symptoms and she in completing exercises by pain. She recently fell resulting in increase right buttocks pain that appears like acute pain from muscular bruising. PT recommend pt begin to utilize [facility] while walking to avoid continued falls now that she demonstrating antalgic gait resulting in instability. Pt is in agreement with this plane. Given severity and duration of symtpoms and lack of response to PT, pt may need further medical intervention to resolve left hip and knee pain. This will further determined after re-evaluation next session to determine whether progress has been made.    Personal Factors and Comorbidities Age;Comorbidity 3+    Comorbidities Diabetes, h/o of right knee and right hip OA, HTN, HLD    Examination-Activity Limitations Bend;Locomotion Level;Stairs;Squat;Stand    Examination-Participation Restrictions Cleaning;Other;Occupation   Any activity  that requires movement   Stability/Clinical Decision Making Stable/Uncomplicated    Clinical Decision Making Low    Rehab Potential Fair    Clinical Impairments Affecting Rehab Potential H/o OA and limited response to steriod injections    PT Frequency 2x / week    PT Duration 8 weeks    PT Treatment/Interventions Neuromuscular re-education;Balance training;Therapeutic exercise;Therapeutic activities;Joint Manipulations;Spinal Manipulations;Moist Heat;Electrical Stimulation;Cryotherapy;DME Instruction;Dry needling;Passive range of motion;Patient/family education    PT Next Visit Plan Reassess goals    PT Home Exercise Plan Seated Hamstring Stretch - 1 x daily - 7 x weekly - 1 sets - 3 reps - 60 hold  Seated Table Hamstring Stretch - 1 x daily - 7 x weekly - 1 sets - 3 reps - 60 hold  Seated Hip Flexion AAROM 3 x 10 x 3 days per week   Seated Quad Stretch 1 x 7 x 60 sec    Consulted and Agree with Plan of Care Patient            HEP includes:   Access Code: [email or web address] URL: [email or web address] Date: 01/16/2021 Prepared by: Bradly Chris  Exercises Supine Bridge - 1 x daily - 3 x weekly - 3 sets - 10 reps Seated Hip Flexion - 1 x daily - 3 x weekly - 3 sets - 10 reps Seated Hamstring Stretch - 1 x daily - 7 x weekly - 1 sets - 3 reps - 60 hold Seated Table Hamstring Stretch - 1 x daily - 7 x  weekly - 1 sets - 3 reps - 60 hold     Patient will benefit from skilled therapeutic intervention in order to improve the following deficits and impairments:  Abnormal gait, Decreased strength, Hypomobility, Pain, Impaired flexibility  Visit Diagnosis: Pain in left hip  Difficulty in walking, not elsewhere classified     Problem List Patient Active Problem List   Diagnosis Date Noted   Chronic venous insufficiency 07/03/2017   Lymphedema 07/03/2017   Bilateral lower extremity edema 05/14/2017   Claudication of left lower extremity ([place]) 05/14/2017   Allergic rhinitis  07/02/2014   Diabetes ([place]) 07/02/2014   Genital herpes 07/02/2014   HLD (hyperlipidemia) 07/02/2014   Adult hypothyroidism 07/02/2014   Arthritis, degenerative 07/02/2014   Adiposity 07/02/2014   Obstructive apnea 07/02/2014   Avitaminosis D 07/02/2014   Tendonitis, deQuervains 11/07/2010   Mucous cyst of toe 11/07/2010   MEDIAL MENISCUS TEAR, LEFT 06/29/2008   KNEE PAIN, LEFT 06/01/2008   TRIGGER FINGER, THUMB 04/20/2008   AODM 02/03/2008   ESSENTIAL HYPERTENSION, BENIGN 02/03/2008   ACHILLES BURSITIS OR TENDINITIS 02/03/2008   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, [place], [address] Phone: [phone]   Fax:  [phone]  Name: Cindy Hester MRN: [id number] Date of Birth: Jan 11, 1946

## 2021-01-17 DIAGNOSIS — J3089 Other allergic rhinitis: Secondary | ICD-10-CM | POA: Diagnosis not present

## 2021-01-17 DIAGNOSIS — J301 Allergic rhinitis due to pollen: Secondary | ICD-10-CM | POA: Diagnosis not present

## 2021-01-18 ENCOUNTER — Other Ambulatory Visit: Payer: Self-pay

## 2021-01-18 ENCOUNTER — Ambulatory Visit (INDEPENDENT_AMBULATORY_CARE_PROVIDER_SITE_OTHER): Payer: BC Managed Care – PPO

## 2021-01-18 DIAGNOSIS — Z23 Encounter for immunization: Secondary | ICD-10-CM | POA: Diagnosis not present

## 2021-01-23 ENCOUNTER — Telehealth: Payer: Self-pay | Admitting: Physical Therapy

## 2021-01-23 ENCOUNTER — Ambulatory Visit: Payer: BC Managed Care – PPO | Admitting: Physical Therapy

## 2021-01-23 NOTE — Telephone Encounter (Signed)
Called pt to inquiry about her absence from PT. She reported forgetting that she had an apt, and that she was concerned about her ability to pay for PT given that she just received a significant bill in mail. PT referred pt to [facility] billing number and instructed her to call back once she knew what course of action she wanted to take.

## 2021-01-24 DIAGNOSIS — J3089 Other allergic rhinitis: Secondary | ICD-10-CM | POA: Diagnosis not present

## 2021-01-24 DIAGNOSIS — J301 Allergic rhinitis due to pollen: Secondary | ICD-10-CM | POA: Diagnosis not present

## 2021-01-24 DIAGNOSIS — G4730 Sleep apnea, unspecified: Secondary | ICD-10-CM | POA: Diagnosis not present

## 2021-01-25 ENCOUNTER — Ambulatory Visit: Payer: BC Managed Care – PPO | Admitting: Physical Therapy

## 2021-01-27 DIAGNOSIS — M25552 Pain in left hip: Secondary | ICD-10-CM | POA: Diagnosis not present

## 2021-01-27 DIAGNOSIS — M1612 Unilateral primary osteoarthritis, left hip: Secondary | ICD-10-CM | POA: Diagnosis not present

## 2021-01-27 DIAGNOSIS — G8929 Other chronic pain: Secondary | ICD-10-CM | POA: Diagnosis not present

## 2021-01-27 DIAGNOSIS — M25562 Pain in left knee: Secondary | ICD-10-CM | POA: Diagnosis not present

## 2021-01-27 DIAGNOSIS — M1712 Unilateral primary osteoarthritis, left knee: Secondary | ICD-10-CM | POA: Diagnosis not present

## 2021-01-31 ENCOUNTER — Ambulatory Visit: Payer: BC Managed Care – PPO | Admitting: Physical Therapy

## 2021-02-02 DIAGNOSIS — J3089 Other allergic rhinitis: Secondary | ICD-10-CM | POA: Diagnosis not present

## 2021-02-02 DIAGNOSIS — J301 Allergic rhinitis due to pollen: Secondary | ICD-10-CM | POA: Diagnosis not present

## 2021-02-07 DIAGNOSIS — J301 Allergic rhinitis due to pollen: Secondary | ICD-10-CM | POA: Diagnosis not present

## 2021-02-07 DIAGNOSIS — J3089 Other allergic rhinitis: Secondary | ICD-10-CM | POA: Diagnosis not present

## 2021-02-08 ENCOUNTER — Other Ambulatory Visit: Payer: Self-pay | Admitting: Family Medicine

## 2021-02-08 DIAGNOSIS — E785 Hyperlipidemia, unspecified: Secondary | ICD-10-CM

## 2021-02-14 DIAGNOSIS — J301 Allergic rhinitis due to pollen: Secondary | ICD-10-CM | POA: Diagnosis not present

## 2021-02-14 DIAGNOSIS — H1045 Other chronic allergic conjunctivitis: Secondary | ICD-10-CM | POA: Diagnosis not present

## 2021-02-14 DIAGNOSIS — J3089 Other allergic rhinitis: Secondary | ICD-10-CM | POA: Diagnosis not present

## 2021-02-23 DIAGNOSIS — J301 Allergic rhinitis due to pollen: Secondary | ICD-10-CM | POA: Diagnosis not present

## 2021-02-23 DIAGNOSIS — J3089 Other allergic rhinitis: Secondary | ICD-10-CM | POA: Diagnosis not present

## 2021-02-28 DIAGNOSIS — J3089 Other allergic rhinitis: Secondary | ICD-10-CM | POA: Diagnosis not present

## 2021-03-01 ENCOUNTER — Ambulatory Visit (INDEPENDENT_AMBULATORY_CARE_PROVIDER_SITE_OTHER): Payer: BC Managed Care – PPO | Admitting: Physician Assistant

## 2021-03-01 ENCOUNTER — Ambulatory Visit: Payer: BC Managed Care – PPO | Admitting: Family Medicine

## 2021-03-01 ENCOUNTER — Other Ambulatory Visit: Payer: Self-pay

## 2021-03-01 DIAGNOSIS — Z23 Encounter for immunization: Secondary | ICD-10-CM

## 2021-03-07 DIAGNOSIS — J3089 Other allergic rhinitis: Secondary | ICD-10-CM | POA: Diagnosis not present

## 2021-03-07 DIAGNOSIS — J301 Allergic rhinitis due to pollen: Secondary | ICD-10-CM | POA: Diagnosis not present

## 2021-03-10 ENCOUNTER — Emergency Department: Payer: BC Managed Care – PPO

## 2021-03-10 ENCOUNTER — Observation Stay
Admission: EM | Admit: 2021-03-10 | Discharge: 2021-03-11 | Disposition: A | Discharge status: Home / Self Care | Payer: BC Managed Care – PPO | Attending: Emergency Medicine | Admitting: Emergency Medicine

## 2021-03-10 DIAGNOSIS — Z7982 Long term (current) use of aspirin: Secondary | ICD-10-CM | POA: Insufficient documentation

## 2021-03-10 DIAGNOSIS — G4733 Obstructive sleep apnea (adult) (pediatric): Secondary | ICD-10-CM | POA: Diagnosis present

## 2021-03-10 DIAGNOSIS — Y92410 Unspecified street and highway as the place of occurrence of the external cause: Secondary | ICD-10-CM | POA: Insufficient documentation

## 2021-03-10 DIAGNOSIS — E039 Hypothyroidism, unspecified: Secondary | ICD-10-CM | POA: Diagnosis present

## 2021-03-10 DIAGNOSIS — S0990XA Unspecified injury of head, initial encounter: Secondary | ICD-10-CM | POA: Diagnosis not present

## 2021-03-10 DIAGNOSIS — Z20822 Contact with and (suspected) exposure to covid-19: Secondary | ICD-10-CM | POA: Insufficient documentation

## 2021-03-10 DIAGNOSIS — S299XXA Unspecified injury of thorax, initial encounter: Secondary | ICD-10-CM | POA: Diagnosis not present

## 2021-03-10 DIAGNOSIS — S2020XA Contusion of thorax, unspecified, initial encounter: Secondary | ICD-10-CM | POA: Diagnosis not present

## 2021-03-10 DIAGNOSIS — M5136 Other intervertebral disc degeneration, lumbar region: Secondary | ICD-10-CM | POA: Diagnosis not present

## 2021-03-10 DIAGNOSIS — Z7984 Long term (current) use of oral hypoglycemic drugs: Secondary | ICD-10-CM | POA: Insufficient documentation

## 2021-03-10 DIAGNOSIS — R7989 Other specified abnormal findings of blood chemistry: Secondary | ICD-10-CM

## 2021-03-10 DIAGNOSIS — R0789 Other chest pain: Secondary | ICD-10-CM | POA: Diagnosis not present

## 2021-03-10 DIAGNOSIS — Z79899 Other long term (current) drug therapy: Secondary | ICD-10-CM | POA: Diagnosis not present

## 2021-03-10 DIAGNOSIS — M5134 Other intervertebral disc degeneration, thoracic region: Secondary | ICD-10-CM | POA: Diagnosis not present

## 2021-03-10 DIAGNOSIS — E119 Type 2 diabetes mellitus without complications: Secondary | ICD-10-CM | POA: Insufficient documentation

## 2021-03-10 DIAGNOSIS — I10 Essential (primary) hypertension: Secondary | ICD-10-CM | POA: Diagnosis not present

## 2021-03-10 DIAGNOSIS — S3993XA Unspecified injury of pelvis, initial encounter: Secondary | ICD-10-CM | POA: Diagnosis not present

## 2021-03-10 DIAGNOSIS — N1832 Chronic kidney disease, stage 3b: Secondary | ICD-10-CM

## 2021-03-10 DIAGNOSIS — S199XXA Unspecified injury of neck, initial encounter: Secondary | ICD-10-CM | POA: Diagnosis not present

## 2021-03-10 DIAGNOSIS — R079 Chest pain, unspecified: Secondary | ICD-10-CM | POA: Diagnosis not present

## 2021-03-10 DIAGNOSIS — S3991XA Unspecified injury of abdomen, initial encounter: Secondary | ICD-10-CM | POA: Diagnosis not present

## 2021-03-10 DIAGNOSIS — M4699 Unspecified inflammatory spondylopathy, multiple sites in spine: Secondary | ICD-10-CM | POA: Diagnosis not present

## 2021-03-10 DIAGNOSIS — S2691XA Contusion of heart, unspecified with or without hemopericardium, initial encounter: Secondary | ICD-10-CM

## 2021-03-10 DIAGNOSIS — S20219A Contusion of unspecified front wall of thorax, initial encounter: Secondary | ICD-10-CM

## 2021-03-10 DIAGNOSIS — Z041 Encounter for examination and observation following transport accident: Secondary | ICD-10-CM | POA: Diagnosis not present

## 2021-03-10 DIAGNOSIS — R778 Other specified abnormalities of plasma proteins: Secondary | ICD-10-CM

## 2021-03-10 HISTORY — DX: Atrioventricular block, first degree: I44.0

## 2021-03-10 HISTORY — DX: Personal history of other medical treatment: Z92.89

## 2021-03-10 HISTORY — DX: Chronic kidney disease, stage 3 unspecified: N18.30

## 2021-03-10 HISTORY — DX: Venous insufficiency (chronic) (peripheral): I87.2

## 2021-03-10 HISTORY — DX: Lymphedema, not elsewhere classified: I89.0

## 2021-03-10 HISTORY — DX: Disorder of arteries and arterioles, unspecified: I77.9

## 2021-03-10 LAB — COMPREHENSIVE METABOLIC PANEL
ALT: 21 U/L (ref 0–44)
AST: 31 U/L (ref 15–41)
Albumin: 4.2 g/dL (ref 3.5–5.0)
Alkaline Phosphatase: 58 U/L (ref 38–126)
Anion gap: 11 (ref 5–15)
BUN: 36 mg/dL — ABNORMAL HIGH (ref 8–23)
CO2: 20 mmol/L — ABNORMAL LOW (ref 22–32)
Calcium: 9.3 mg/dL (ref 8.9–10.3)
Chloride: 104 mmol/L (ref 98–111)
Creatinine, Ser: 1.21 mg/dL — ABNORMAL HIGH (ref 0.44–1.00)
GFR, Estimated: 47 mL/min — ABNORMAL LOW (ref >60)
Glucose, Bld: 126 mg/dL — ABNORMAL HIGH (ref 70–99)
Potassium: 4 mmol/L (ref 3.5–5.1)
Sodium: 135 mmol/L (ref 135–145)
Total Bilirubin: 0.6 mg/dL (ref 0.3–1.2)
Total Protein: 6.8 g/dL (ref 6.5–8.1)

## 2021-03-10 LAB — CBC WITH DIFFERENTIAL/PLATELET
Abs Immature Granulocytes: 0.04 10*3/uL (ref 0.00–0.07)
Basophils Absolute: 0.1 10*3/uL (ref 0.0–0.1)
Basophils Relative: 0 %
Eosinophils Absolute: 0.2 10*3/uL (ref 0.0–0.5)
Eosinophils Relative: 2 %
HCT: 44.7 % (ref 36.0–46.0)
Hemoglobin: 14 g/dL (ref 12.0–15.0)
Immature Granulocytes: 0 %
Lymphocytes Relative: 17 %
Lymphs Abs: 1.9 10*3/uL (ref 0.7–4.0)
MCH: 28.1 pg (ref 26.0–34.0)
MCHC: 31.3 g/dL (ref 30.0–36.0)
MCV: 89.8 fL (ref 80.0–100.0)
Monocytes Absolute: 1 10*3/uL (ref 0.1–1.0)
Monocytes Relative: 9 %
Neutro Abs: 8.2 10*3/uL — ABNORMAL HIGH (ref 1.7–7.7)
Neutrophils Relative %: 72 %
Platelets: 305 10*3/uL (ref 150–400)
RBC: 4.98 MIL/uL (ref 3.87–5.11)
RDW: 13.8 % (ref 11.5–15.5)
WBC: 11.4 10*3/uL — ABNORMAL HIGH (ref 4.0–10.5)
nRBC: 0 % (ref 0.0–0.2)

## 2021-03-10 LAB — TROPONIN I (HIGH SENSITIVITY): Troponin I (High Sensitivity): 143 ng/L (ref <18)

## 2021-03-10 MED ORDER — OXYCODONE-ACETAMINOPHEN 5-325 MG PO TABS
1.0000 | ORAL_TABLET | Freq: Once | ORAL | Status: AC
  Administered 2021-03-10: 1 via ORAL
  Filled 2021-03-10: qty 1

## 2021-03-10 MED ORDER — ONDANSETRON 4 MG PO TBDP
4.0000 mg | ORAL_TABLET | Freq: Once | ORAL | Status: AC
  Administered 2021-03-10: 4 mg via ORAL
  Filled 2021-03-10: qty 1
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
## 2021-03-11 ENCOUNTER — Observation Stay (HOSPITAL_BASED_OUTPATIENT_CLINIC_OR_DEPARTMENT_OTHER)
Admit: 2021-03-11 | Discharge: 2021-03-11 | Disposition: A | Discharge status: Home / Self Care | Payer: BC Managed Care – PPO | Attending: Nurse Practitioner | Admitting: Nurse Practitioner

## 2021-03-11 ENCOUNTER — Encounter: Payer: Self-pay | Admitting: Internal Medicine

## 2021-03-11 ENCOUNTER — Emergency Department: Payer: BC Managed Care – PPO

## 2021-03-11 DIAGNOSIS — E785 Hyperlipidemia, unspecified: Secondary | ICD-10-CM

## 2021-03-11 DIAGNOSIS — R7989 Other specified abnormal findings of blood chemistry: Secondary | ICD-10-CM

## 2021-03-11 DIAGNOSIS — S2691XA Contusion of heart, unspecified with or without hemopericardium, initial encounter: Secondary | ICD-10-CM

## 2021-03-11 DIAGNOSIS — S20219A Contusion of unspecified front wall of thorax, initial encounter: Secondary | ICD-10-CM

## 2021-03-11 DIAGNOSIS — S0990XA Unspecified injury of head, initial encounter: Secondary | ICD-10-CM | POA: Diagnosis not present

## 2021-03-11 DIAGNOSIS — R072 Precordial pain: Secondary | ICD-10-CM | POA: Diagnosis not present

## 2021-03-11 DIAGNOSIS — R778 Other specified abnormalities of plasma proteins: Secondary | ICD-10-CM | POA: Diagnosis not present

## 2021-03-11 DIAGNOSIS — R079 Chest pain, unspecified: Secondary | ICD-10-CM

## 2021-03-11 DIAGNOSIS — S199XXA Unspecified injury of neck, initial encounter: Secondary | ICD-10-CM | POA: Diagnosis not present

## 2021-03-11 DIAGNOSIS — S3993XA Unspecified injury of pelvis, initial encounter: Secondary | ICD-10-CM | POA: Diagnosis not present

## 2021-03-11 DIAGNOSIS — M4699 Unspecified inflammatory spondylopathy, multiple sites in spine: Secondary | ICD-10-CM | POA: Diagnosis not present

## 2021-03-11 DIAGNOSIS — I10 Essential (primary) hypertension: Secondary | ICD-10-CM

## 2021-03-11 DIAGNOSIS — S299XXA Unspecified injury of thorax, initial encounter: Secondary | ICD-10-CM | POA: Diagnosis not present

## 2021-03-11 DIAGNOSIS — Z041 Encounter for examination and observation following transport accident: Secondary | ICD-10-CM | POA: Diagnosis not present

## 2021-03-11 DIAGNOSIS — S3991XA Unspecified injury of abdomen, initial encounter: Secondary | ICD-10-CM | POA: Diagnosis not present

## 2021-03-11 LAB — TROPONIN I (HIGH SENSITIVITY)
Troponin I (High Sensitivity): 270 ng/L (ref <18)
Troponin I (High Sensitivity): 160 ng/L (ref <18)
Troponin I (High Sensitivity): 104 ng/L (ref <18)

## 2021-03-11 LAB — ECHOCARDIOGRAM COMPLETE
AR max vel: 2.09 cm2
AV Peak grad: 10.8 mmHg
Ao pk vel: 1.64 m/s
Area-P 1/2: 3.72 cm2
Calc EF: 75.3 %
Height: 66 in
S' Lateral: 2.9 cm
Single Plane A2C EF: 76.4 %
Single Plane A4C EF: 72.1 %
Weight: 2880 oz

## 2021-03-11 LAB — HEMOGLOBIN A1C
Hgb A1c MFr Bld: 6.5 % — ABNORMAL HIGH (ref 4.8–5.6)
Mean Plasma Glucose: 139.85 mg/dL

## 2021-03-11 LAB — RESP PANEL BY RT-PCR (FLU A&B, COVID) ARPGX2
Influenza A by PCR: NEGATIVE
Influenza B by PCR: NEGATIVE
SARS Coronavirus 2 by RT PCR: NEGATIVE

## 2021-03-11 LAB — CBG MONITORING, ED
Glucose-Capillary: 118 mg/dL — ABNORMAL HIGH (ref 70–99)
Glucose-Capillary: 140 mg/dL — ABNORMAL HIGH (ref 70–99)

## 2021-03-11 MED ORDER — PERFLUTREN LIPID MICROSPHERE
1.0000 mL | INTRAVENOUS | Status: AC | PRN
  Administered 2021-03-11: 3 mL via INTRAVENOUS
  Filled 2021-03-11: qty 10

## 2021-03-11 MED ORDER — ONDANSETRON HCL 4 MG/2ML IJ SOLN: 4.0000 mg | Freq: Four times a day (QID) | INTRAMUSCULAR | Status: DC | PRN

## 2021-03-11 MED ORDER — INSULIN ASPART 100 UNIT/ML IJ SOLN: 0.0000 [IU] | Freq: Every day | INTRAMUSCULAR | Status: DC

## 2021-03-11 MED ORDER — IOHEXOL 300 MG/ML  SOLN
80.0000 mL | Freq: Once | INTRAMUSCULAR | Status: AC | PRN
  Administered 2021-03-11: 80 mL via INTRAVENOUS
  Filled 2021-03-11: qty 80

## 2021-03-11 MED ORDER — INSULIN ASPART 100 UNIT/ML IJ SOLN: 0.0000 [IU] | Freq: Three times a day (TID) | INTRAMUSCULAR | Status: DC

## 2021-03-11 MED ORDER — ONDANSETRON HCL 4 MG PO TABS: 4.0000 mg | ORAL_TABLET | Freq: Four times a day (QID) | ORAL | Status: DC | PRN

## 2021-03-11 MED ORDER — ACETAMINOPHEN 650 MG RE SUPP: 650.0000 mg | Freq: Four times a day (QID) | RECTAL | Status: DC | PRN

## 2021-03-11 MED ORDER — ACETAMINOPHEN 325 MG PO TABS: 650.0000 mg | ORAL_TABLET | Freq: Four times a day (QID) | ORAL | Status: DC | PRN

## 2021-03-11 NOTE — Assessment & Plan Note (Signed)
Extensive imaging work-up with no evidence of internal injury

## 2021-03-11 NOTE — Assessment & Plan Note (Signed)
Continue home amlodipine, metoprolol, spironolactone

## 2021-03-11 NOTE — Assessment & Plan Note (Signed)
Continue levothyroxine 
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
## 2021-03-11 NOTE — ED Notes (Signed)
Pt's lunch tray was delivered.

## 2021-03-11 NOTE — Assessment & Plan Note (Signed)
Ibuprofen Cool/warm compress

## 2021-03-11 NOTE — Assessment & Plan Note (Signed)
Sliding scale insulin coverage 

## 2021-03-11 NOTE — ED Notes (Signed)
Patient transported to CT 
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
## 2021-03-11 NOTE — Progress Notes (Signed)
*  PRELIMINARY RESULTS* Echocardiogram 2D Echocardiogram has been performed. Definity IV Contrast was used on this study.  Claretta Fraise 03/11/2021, 9:32 AM

## 2021-03-11 NOTE — ED Notes (Signed)
Pt resting in bed NAD. States CP only with movement. Pt requests d/c today if possible. MD messaged. Denies further needs.
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
## 2021-03-11 NOTE — Assessment & Plan Note (Signed)
CPAP if desired 

## 2021-03-16 ENCOUNTER — Other Ambulatory Visit: Payer: Self-pay | Admitting: Family Medicine

## 2021-03-16 NOTE — Telephone Encounter (Signed)
Request for future RF- too soon Requested Prescriptions  Pending Prescriptions Disp Refills   metoprolol (TOPROL-XL) 200 MG 24 hr tablet [Pharmacy Med Name: METOPROLOL ER SUCCINATE 200MG  TABS] 90 tablet 4    Sig: TAKE 1 TABLET(200 MG) BY MOUTH DAILY     Cardiovascular:  Beta Blockers Passed - 03/16/2021  8:09 AM      Passed - Last BP in normal range    BP Readings from Last 1 Encounters:  03/11/21 137/88         Passed - Last Heart Rate in normal range    Pulse Readings from Last 1 Encounters:  03/11/21 84         Passed - Valid encounter within last 6 months    Recent Outpatient Visits          5 months ago Annual physical exam   [facility] Jerrol Banana., MD   1 year ago Chronic maxillary sinusitis   [facility] Trinna Post, [place]   1 year ago Annual physical exam   [facility] Jerrol Banana., MD   1 year ago Chronic venous insufficiency   [facility] Jerrol Banana., MD   2 years ago Annual physical exam   [facility] Jerrol Banana., MD      Future Appointments            In 4 days Mikey Kirschner, PA-C [facility], [place]   In 6 days Alethia Berthold, PA-C [facility]   In 6 months Jerrol Banana., MD [facility], [place]   In 8 months Adrian Prows, MD [facility]

## 2021-03-17 DIAGNOSIS — J3089 Other allergic rhinitis: Secondary | ICD-10-CM | POA: Diagnosis not present

## 2021-03-17 DIAGNOSIS — J301 Allergic rhinitis due to pollen: Secondary | ICD-10-CM | POA: Diagnosis not present

## 2021-03-20 ENCOUNTER — Other Ambulatory Visit: Payer: Self-pay

## 2021-03-20 ENCOUNTER — Encounter: Payer: Self-pay | Admitting: Physician Assistant

## 2021-03-20 ENCOUNTER — Ambulatory Visit: Payer: BC Managed Care – PPO | Admitting: Physician Assistant

## 2021-03-20 VITALS — BP 124/54 | HR 69 | Temp 97.8°F | Ht 66.5 in | Wt 178.6 lb

## 2021-03-20 DIAGNOSIS — F4321 Adjustment disorder with depressed mood: Secondary | ICD-10-CM

## 2021-03-20 DIAGNOSIS — S20219A Contusion of unspecified front wall of thorax, initial encounter: Secondary | ICD-10-CM

## 2021-03-20 DIAGNOSIS — S161XXA Strain of muscle, fascia and tendon at neck level, initial encounter: Secondary | ICD-10-CM

## 2021-03-20 MED ORDER — CYCLOBENZAPRINE HCL 5 MG PO TABS: 5.0000 mg | ORAL_TABLET | Freq: Every evening | ORAL | 0 refills | Status: DC | PRN
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
## 2021-03-22 ENCOUNTER — Ambulatory Visit: Payer: BC Managed Care – PPO | Admitting: Student

## 2021-03-22 ENCOUNTER — Encounter: Payer: Self-pay | Admitting: Student

## 2021-03-22 ENCOUNTER — Other Ambulatory Visit: Payer: Self-pay

## 2021-03-22 VITALS — BP 127/75 | HR 84 | Temp 98.0°F | Resp 17 | Ht 66.5 in | Wt 176.4 lb

## 2021-03-22 DIAGNOSIS — R778 Other specified abnormalities of plasma proteins: Secondary | ICD-10-CM | POA: Diagnosis not present

## 2021-03-22 DIAGNOSIS — I10 Essential (primary) hypertension: Secondary | ICD-10-CM | POA: Diagnosis not present

## 2021-03-22 DIAGNOSIS — R7989 Other specified abnormal findings of blood chemistry: Secondary | ICD-10-CM

## 2021-03-22 DIAGNOSIS — I700 Atherosclerosis of aorta: Secondary | ICD-10-CM

## 2021-03-24 DIAGNOSIS — J3089 Other allergic rhinitis: Secondary | ICD-10-CM | POA: Diagnosis not present

## 2021-03-24 DIAGNOSIS — J301 Allergic rhinitis due to pollen: Secondary | ICD-10-CM | POA: Diagnosis not present

## 2021-03-28 DIAGNOSIS — J3089 Other allergic rhinitis: Secondary | ICD-10-CM | POA: Diagnosis not present

## 2021-03-28 DIAGNOSIS — J301 Allergic rhinitis due to pollen: Secondary | ICD-10-CM | POA: Diagnosis not present

## 2021-04-07 ENCOUNTER — Telehealth: Payer: Self-pay

## 2021-04-07 DIAGNOSIS — J301 Allergic rhinitis due to pollen: Secondary | ICD-10-CM | POA: Diagnosis not present

## 2021-04-07 DIAGNOSIS — J3089 Other allergic rhinitis: Secondary | ICD-10-CM | POA: Diagnosis not present

## 2021-04-07 NOTE — Telephone Encounter (Signed)
Sending a form back to verify that patient is a non smoker for insurance purposes.   Patient dropped this off back in January but evidently is is lost.   So im sending another form back for a signature.   Please call patient when signed.  [phone]

## 2021-04-10 NOTE — Telephone Encounter (Signed)
Received and placed in providers basket. ?

## 2021-04-11 ENCOUNTER — Other Ambulatory Visit: Payer: Self-pay | Admitting: Gastroenterology

## 2021-04-11 DIAGNOSIS — R1013 Epigastric pain: Secondary | ICD-10-CM | POA: Diagnosis not present

## 2021-04-11 DIAGNOSIS — K5900 Constipation, unspecified: Secondary | ICD-10-CM | POA: Diagnosis not present

## 2021-04-11 DIAGNOSIS — J3089 Other allergic rhinitis: Secondary | ICD-10-CM | POA: Diagnosis not present

## 2021-04-11 DIAGNOSIS — Z1211 Encounter for screening for malignant neoplasm of colon: Secondary | ICD-10-CM | POA: Diagnosis not present

## 2021-04-11 DIAGNOSIS — K6389 Other specified diseases of intestine: Secondary | ICD-10-CM | POA: Diagnosis not present

## 2021-04-11 DIAGNOSIS — J301 Allergic rhinitis due to pollen: Secondary | ICD-10-CM | POA: Diagnosis not present

## 2021-04-11 NOTE — Telephone Encounter (Signed)
Done

## 2021-04-12 ENCOUNTER — Other Ambulatory Visit: Payer: Self-pay

## 2021-04-12 ENCOUNTER — Ambulatory Visit: Payer: BC Managed Care – PPO

## 2021-04-12 DIAGNOSIS — Z09 Encounter for follow-up examination after completed treatment for conditions other than malignant neoplasm: Secondary | ICD-10-CM | POA: Diagnosis not present

## 2021-04-12 DIAGNOSIS — R778 Other specified abnormalities of plasma proteins: Secondary | ICD-10-CM

## 2021-04-17 DIAGNOSIS — G4730 Sleep apnea, unspecified: Secondary | ICD-10-CM | POA: Diagnosis not present

## 2021-04-18 ENCOUNTER — Other Ambulatory Visit: Payer: Self-pay

## 2021-04-18 ENCOUNTER — Ambulatory Visit
Admission: RE | Admit: 2021-04-18 | Discharge: 2021-04-18 | Disposition: A | Discharge status: Home / Self Care | Payer: BC Managed Care – PPO | Source: Ambulatory Visit | Attending: Gastroenterology | Admitting: Gastroenterology

## 2021-04-18 DIAGNOSIS — R1013 Epigastric pain: Secondary | ICD-10-CM | POA: Diagnosis not present

## 2021-04-18 DIAGNOSIS — J3089 Other allergic rhinitis: Secondary | ICD-10-CM | POA: Diagnosis not present

## 2021-04-18 DIAGNOSIS — J301 Allergic rhinitis due to pollen: Secondary | ICD-10-CM | POA: Diagnosis not present

## 2021-04-19 ENCOUNTER — Ambulatory Visit (INDEPENDENT_AMBULATORY_CARE_PROVIDER_SITE_OTHER): Payer: Self-pay | Admitting: Dermatology

## 2021-04-19 DIAGNOSIS — L988 Other specified disorders of the skin and subcutaneous tissue: Secondary | ICD-10-CM

## 2021-04-19 NOTE — Progress Notes (Signed)
? ?  Follow-Up Visit ?  ?Subjective  ?Cindy Hester is a 76 y.o. female who presents for the following: Follow-up (Patient here today for botox and filler. ). ? ?The following portions of the chart were reviewed this encounter and updated as appropriate:  Tobacco  Allergies  Meds  Problems  Med Hx  Surg Hx  Fam Hx   ?  ?Review of Systems: No other skin or systemic complaints except as noted in HPI or Assessment and Plan. ? ?Objective  ?Well appearing patient in no apparent distress; mood and affect are within normal limits. ? ?A focused examination was performed including face. Relevant physical exam findings are noted in the Assessment and Plan. ? ?Head - Anterior (Face) ?Rhytides and volume loss. ? ? ? ? ? ? ? ? ? ? ? ? ? ? ? ? ? ? ? ? ? ? ? ? ? ? ? ? ? ? ?Assessment & Plan  ?Elastosis of skin ?Head - Anterior (Face) ? ?Patient injected with 40 units botox ?Frown Complex 32.5 units ?Chin 7.5 units ? ?Restylane Refyne 1 cc ?Oral Commissures ?Corners of mouth,  ?Marionette Lines,  ?anterior chin crease  ? ?Botox Injection - Head - Anterior (Face) ?Location: frown complex and chin ? ?Informed consent: Discussed risks (infection, pain, bleeding, bruising, swelling, allergic reaction, paralysis of nearby muscles, eyelid droop, double vision, neck weakness, difficulty breathing, headache, undesirable cosmetic result, and need for additional treatment) and benefits of the procedure, as well as the alternatives.  Informed consent was obtained. ? ?Preparation: The area was cleansed with alcohol. ? ?Procedure Details:  Botox was injected into the dermis with a 30-gauge needle. Pressure applied to any bleeding. Ice packs offered for swelling. ? ?Lot Number:  [id number] ?Expiration:  04/2023 ? ?Total Units Injected:  40  ? ?Plan: Tylenol may be used for headache.  Allow 2 weeks before returning to clinic for additional dosing as needed. Patient will call for any problems. ? ? ?Filling material injection - Head - Anterior  (Face) ?Prior to the procedure, the patient's past medical history, allergies and the rare but potential risks and complications were reviewed with the patient and a signed consent was obtained. Pre and post-treatment care was discussed and instructions provided. ? ?Location: Oral Commissures, Corners of mouth, Marionette Lines, anterior chin crease  ? ?Filler Type: Restylane refyne ? ?Procedure: The area was prepped thoroughly with Puracyn. After introducing the needle into the desired treatment area, the syringe plunger was drawn back to ensure there was no flash of blood prior to injecting the filler in order to minimize risk of intravascular injection and vascular occlusion. After injection of the filler, the treated areas were cleansed and iced to reduce swelling. Post-treatment instructions were reviewed with the patient.      ? ?Patient tolerated the procedure well. The patient will call with any problems, questions or concerns prior to their next appointment. ? ?Lot # [id number] ?Exp # 04/04/2022 ? ? ?Return for 4 month botox with possible fillers follow up. ?I, Ruthell Rummage, CMA, am acting as scribe for Sarina Ser, MD. ?Documentation: I have reviewed the above documentation for accuracy and completeness, and I agree with the above. ? ?Sarina Ser, MD ? ?

## 2021-04-19 NOTE — Patient Instructions (Addendum)

## 2021-04-24 ENCOUNTER — Encounter: Payer: Self-pay | Admitting: Dermatology

## 2021-04-25 DIAGNOSIS — J301 Allergic rhinitis due to pollen: Secondary | ICD-10-CM | POA: Diagnosis not present

## 2021-04-25 DIAGNOSIS — J3089 Other allergic rhinitis: Secondary | ICD-10-CM | POA: Diagnosis not present

## 2021-04-26 DIAGNOSIS — E119 Type 2 diabetes mellitus without complications: Secondary | ICD-10-CM | POA: Diagnosis not present

## 2021-04-26 DIAGNOSIS — E1159 Type 2 diabetes mellitus with other circulatory complications: Secondary | ICD-10-CM | POA: Diagnosis not present

## 2021-04-26 DIAGNOSIS — E559 Vitamin D deficiency, unspecified: Secondary | ICD-10-CM | POA: Diagnosis not present

## 2021-04-26 DIAGNOSIS — E1169 Type 2 diabetes mellitus with other specified complication: Secondary | ICD-10-CM | POA: Diagnosis not present

## 2021-05-01 ENCOUNTER — Other Ambulatory Visit: Payer: Self-pay

## 2021-05-01 ENCOUNTER — Ambulatory Visit: Payer: BC Managed Care – PPO | Admitting: Cardiology

## 2021-05-01 ENCOUNTER — Encounter: Payer: Self-pay | Admitting: Cardiology

## 2021-05-01 VITALS — BP 118/66 | HR 81 | Temp 98.1°F | Resp 16 | Ht 66.5 in | Wt 178.6 lb

## 2021-05-01 DIAGNOSIS — E782 Mixed hyperlipidemia: Secondary | ICD-10-CM

## 2021-05-01 DIAGNOSIS — R0789 Other chest pain: Secondary | ICD-10-CM

## 2021-05-01 DIAGNOSIS — M25552 Pain in left hip: Secondary | ICD-10-CM | POA: Diagnosis not present

## 2021-05-01 DIAGNOSIS — M1612 Unilateral primary osteoarthritis, left hip: Secondary | ICD-10-CM | POA: Diagnosis not present

## 2021-05-01 DIAGNOSIS — I700 Atherosclerosis of aorta: Secondary | ICD-10-CM | POA: Diagnosis not present
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
## 2021-05-02 DIAGNOSIS — J3089 Other allergic rhinitis: Secondary | ICD-10-CM | POA: Diagnosis not present

## 2021-05-02 DIAGNOSIS — J301 Allergic rhinitis due to pollen: Secondary | ICD-10-CM | POA: Diagnosis not present

## 2021-05-11 DIAGNOSIS — J3089 Other allergic rhinitis: Secondary | ICD-10-CM | POA: Diagnosis not present

## 2021-05-16 DIAGNOSIS — J3089 Other allergic rhinitis: Secondary | ICD-10-CM | POA: Diagnosis not present

## 2021-05-16 DIAGNOSIS — J301 Allergic rhinitis due to pollen: Secondary | ICD-10-CM | POA: Diagnosis not present

## 2021-05-16 DIAGNOSIS — J3081 Allergic rhinitis due to animal (cat) (dog) hair and dander: Secondary | ICD-10-CM | POA: Diagnosis not present

## 2021-05-17 DIAGNOSIS — N1832 Chronic kidney disease, stage 3b: Secondary | ICD-10-CM

## 2021-05-25 DIAGNOSIS — J3089 Other allergic rhinitis: Secondary | ICD-10-CM | POA: Diagnosis not present

## 2021-06-02 DIAGNOSIS — J301 Allergic rhinitis due to pollen: Secondary | ICD-10-CM | POA: Diagnosis not present

## 2021-06-02 DIAGNOSIS — J3089 Other allergic rhinitis: Secondary | ICD-10-CM | POA: Diagnosis not present

## 2021-06-08 ENCOUNTER — Other Ambulatory Visit: Payer: Self-pay | Admitting: Family Medicine

## 2021-06-08 DIAGNOSIS — Z1231 Encounter for screening mammogram for malignant neoplasm of breast: Secondary | ICD-10-CM

## 2021-06-08 DIAGNOSIS — J3089 Other allergic rhinitis: Secondary | ICD-10-CM | POA: Diagnosis not present

## 2021-06-14 ENCOUNTER — Other Ambulatory Visit: Payer: Self-pay | Admitting: Family Medicine

## 2021-06-16 DIAGNOSIS — J3089 Other allergic rhinitis: Secondary | ICD-10-CM | POA: Diagnosis not present

## 2021-06-16 DIAGNOSIS — J301 Allergic rhinitis due to pollen: Secondary | ICD-10-CM | POA: Diagnosis not present

## 2021-06-23 DIAGNOSIS — J301 Allergic rhinitis due to pollen: Secondary | ICD-10-CM | POA: Diagnosis not present

## 2021-06-23 DIAGNOSIS — J3089 Other allergic rhinitis: Secondary | ICD-10-CM | POA: Diagnosis not present

## 2021-06-27 DIAGNOSIS — M1612 Unilateral primary osteoarthritis, left hip: Secondary | ICD-10-CM | POA: Diagnosis not present

## 2021-06-27 DIAGNOSIS — J3089 Other allergic rhinitis: Secondary | ICD-10-CM | POA: Diagnosis not present

## 2021-06-27 DIAGNOSIS — J301 Allergic rhinitis due to pollen: Secondary | ICD-10-CM | POA: Diagnosis not present

## 2021-07-02 DIAGNOSIS — M1612 Unilateral primary osteoarthritis, left hip: Secondary | ICD-10-CM | POA: Insufficient documentation

## 2021-07-02 DIAGNOSIS — H1045 Other chronic allergic conjunctivitis: Secondary | ICD-10-CM | POA: Insufficient documentation

## 2021-07-04 ENCOUNTER — Encounter: Payer: Self-pay | Admitting: Cardiology

## 2021-07-05 ENCOUNTER — Other Ambulatory Visit: Payer: Self-pay | Admitting: Family Medicine

## 2021-07-06 DIAGNOSIS — J301 Allergic rhinitis due to pollen: Secondary | ICD-10-CM | POA: Diagnosis not present

## 2021-07-06 DIAGNOSIS — J3089 Other allergic rhinitis: Secondary | ICD-10-CM | POA: Diagnosis not present

## 2021-07-07 ENCOUNTER — Ambulatory Visit
Admission: RE | Admit: 2021-07-07 | Discharge: 2021-07-07 | Disposition: A | Discharge status: Home / Self Care | Payer: BC Managed Care – PPO | Source: Ambulatory Visit | Attending: Family Medicine | Admitting: Family Medicine

## 2021-07-07 DIAGNOSIS — Z1231 Encounter for screening mammogram for malignant neoplasm of breast: Secondary | ICD-10-CM | POA: Insufficient documentation

## 2021-07-13 DIAGNOSIS — J3089 Other allergic rhinitis: Secondary | ICD-10-CM | POA: Diagnosis not present

## 2021-07-13 DIAGNOSIS — J301 Allergic rhinitis due to pollen: Secondary | ICD-10-CM | POA: Diagnosis not present

## 2021-07-14 ENCOUNTER — Encounter: Payer: Self-pay | Admitting: Gastroenterology

## 2021-07-16 ENCOUNTER — Encounter: Payer: Self-pay | Admitting: Gastroenterology

## 2021-07-16 NOTE — H&P (Signed)
Pre-Procedure H&P   Patient ID: Cindy Hester is a 76 y.o. female.  Gastroenterology Provider: [name], DO  PCP: Jerrol Banana., MD  Date: 07/17/2021  HPI Ms. Cindy Hester is a 76 y.o. female who presents today for Colonoscopy for screening colonoscopy. Pt has noted alternating diarrhea/constipation w/o melena/hematochezia. Does note if 2-3 days go by w/o BM she has urgency and some fecal leakage. Cramping also present on days w/o BM which goes away as she has BM. Notes belching and early satiety w/o N/V. She is on ozempic. Was positive for SIBO in 2018 and treated.  Sx unchanged after miralax bowel cleanout. Taking miralax some days as still having fecal leakage.  Cmp nml. Cbc hgb 14 plt 305K mcv 90 cr 1.2  Mult normal egds from 2017-2019 with [facility], and [facility] 2017 egd with negative bx for HP and BE 2012 CSY- normal  2019- nml GES; 2018- nml ugi and sbft  No fhx crc/polyps  Past Medical History:  Diagnosis Date   1st degree AV block    Arthritis    right shoulder blade, left thumb   Carotid arterial disease ([place])    a. 10/2020 U/S: RICA 97-98%, LICA 92-11%. Antegrade bilat vertebral flow.   Chronic venous insufficiency    CKD (chronic kidney disease), stage III ([facility])    Diabetes mellitus without complication ([facility])    GERD (gastroesophageal reflux disease)    Heart murmur    a. 12/2017 Echo: EF 50-55%, mild conc LVH, GrI DD, Trace MR/TR.   History of esophagitis    History of gastritis    History of stress test    a. 12/2017 MV: HTN response to exercise. EF 68%. Small, mild, reversible inferior apical defect-->Low risk.   HLD (hyperlipidemia)    Hypertension    Lymphedema    Sleep apnea    a. Uses CPAP    Past Surgical History:  Procedure Laterality Date   BREAST CYST EXCISION Left    removed two times   [place]   CYST EXCISION     from left breast   ESOPHAGOGASTRODUODENOSCOPY (EGD) WITH PROPOFOL N/A  12/12/2015   gastritis, LA Grade A reflux esophagitis ESOPHAGOGASTRODUODENOSCOPY (EGD) WITH PROPOFOL;  Surgeon: Manya Silvas, MD;  Location: [facility];  Service: Endoscopy;  Laterality: N/A;  Diabetic   HAMMER TOE SURGERY Bilateral 05/30/2016   Procedure: HAMMER TOE CORRECTION RIGHT 2ND, 3RD, 4TH, 5TH TOES LEFT 5TH TOE;  Surgeon: Samara Deist, DPM;  Location: [facility];  Service: Podiatry;  Laterality: Bilateral;   HEEL SPUR SURGERY Right 2011   NASAL SINUS SURGERY     TONSILLECTOMY      Family History No h/o GI disease or malignancy  Review of Systems  Constitutional:  Negative for activity change, appetite change, chills, diaphoresis, fatigue, fever and unexpected weight change.  HENT:  Negative for trouble swallowing and voice change.   Respiratory:  Negative for shortness of breath and wheezing.   Cardiovascular:  Negative for chest pain, palpitations and leg swelling.  Gastrointestinal:  Positive for abdominal distention, abdominal pain, constipation and diarrhea. Negative for anal bleeding, blood in stool, nausea, rectal pain and vomiting.  Musculoskeletal:  Negative for arthralgias and myalgias.  Skin:  Negative for color change and pallor.  Neurological:  Negative for dizziness, syncope and weakness.  Psychiatric/Behavioral:  Negative for confusion.   All other systems reviewed and are negative.    Medications No current facility-administered medications  on file prior to encounter.   Current Outpatient Medications on File Prior to Encounter  Medication Sig Dispense Refill   acetaminophen (TYLENOL) 500 MG tablet Take 325 mg by mouth every 6 (six) hours as needed.     amLODipine (NORVASC) 10 MG tablet TAKE 1 TABLET(10 MG) BY MOUTH DAILY 90 tablet 3   aspirin 81 MG tablet Take 1 tablet (81 mg total) daily by mouth. 30 tablet    azelastine (OPTIVAR) 0.05 % ophthalmic solution Place 1 drop into both eyes as needed.     BIOTIN 5000 PO Take by mouth daily.      cholecalciferol (VITAMIN D) 1000 UNITS tablet Take 5,000 Units by mouth.  Twice a week      cyclobenzaprine (FLEXERIL) 5 MG tablet Take 1 tablet (5 mg total) by mouth at bedtime as needed for muscle spasms (up to three times daily). 30 tablet 0   empagliflozin (JARDIANCE) 25 MG TABS tablet Take 25 mg by mouth daily.     EPINEPHrine 0.3 mg/0.3 mL IJ SOAJ injection INJ UTD  1   ezetimibe (ZETIA) 10 MG tablet TAKE 1 TABLET(10 MG) BY MOUTH DAILY AFTER SUPPER 90 tablet 3   furosemide (LASIX) 40 MG tablet TAKE 1/2 TABLET BY MOUTH EVERY DAY AS NEEDED 45 tablet 0   metoprolol (TOPROL-XL) 200 MG 24 hr tablet TAKE 1 TABLET(200 MG) BY MOUTH DAILY 90 tablet 4   Multiple Vitamin (MULTIVITAMIN) tablet Take 1 tablet by mouth daily.     NON FORMULARY CPAP     OZEMPIC, 0.25 OR 0.5 MG/DOSE, 2 MG/1.5ML SOPN INJ 0.5 MG [place] ONCE A WEEK     pantoprazole (PROTONIX) 40 MG tablet TAKE 1 TABLET(40 MG) BY MOUTH DAILY 90 tablet 1   rosuvastatin (CRESTOR) 20 MG tablet TAKE 1 TABLET(20 MG) BY MOUTH AT BEDTIME 90 tablet 1   spironolactone (ALDACTONE) 25 MG tablet TAKE 1 TABLET BY MOUTH EVERY DAY 90 tablet 1   sucralfate (CARAFATE) 1 g tablet Take 1 g by mouth daily.     valsartan-hydrochlorothiazide (DIOVAN-HCT) 160-25 MG tablet TAKE 1 TABLET BY MOUTH EVERY MORNING 90 tablet 3   vitamin B-12 (CYANOCOBALAMIN) 1000 MCG tablet Take 1,000 mcg by mouth.     fluticasone (FLONASE) 50 MCG/ACT nasal spray Place 1 spray into the nose as needed.     ondansetron (ZOFRAN ODT) 4 MG disintegrating tablet Take 1 tablet (4 mg total) by mouth every 8 (eight) hours as needed for nausea or vomiting. 90 tablet 1    Pertinent medications related to GI and procedure were reviewed by me with the patient prior to the procedure   Current Facility-Administered Medications:    0.9 %  sodium chloride infusion, , Intravenous, Continuous, [name], DO      Allergies  Allergen Reactions   Tape     Most tapes cause redness.  Paper tape  and tegaderm are OK.   Allergies were reviewed by me prior to the procedure  Objective   Body mass index is 28.19 kg/m. Vitals:   07/17/21 0842  BP: 133/71  Pulse: 74  Resp: 20  Temp: (!) 96.7 F (35.9 C)  TempSrc: Temporal  SpO2: 100%  Weight: 78 kg  Height: 5' 5.5" (1.664 m)     Physical Exam Vitals and nursing note reviewed.  Constitutional:      General: She is not in acute distress.    Appearance: Normal appearance. She is not ill-appearing, toxic-appearing or diaphoretic.  HENT:  Head: Normocephalic and atraumatic.     Nose: Nose normal.     Mouth/Throat:     Mouth: Mucous membranes are moist.     Pharynx: Oropharynx is clear.  Eyes:     General: No scleral icterus.    Extraocular Movements: Extraocular movements intact.  Cardiovascular:     Rate and Rhythm: Normal rate and regular rhythm.     Heart sounds: Normal heart sounds. No murmur heard.    No friction rub. No gallop.  Pulmonary:     Effort: Pulmonary effort is normal. No respiratory distress.     Breath sounds: Normal breath sounds. No wheezing, rhonchi or rales.  Abdominal:     General: Bowel sounds are normal. There is no distension.     Palpations: Abdomen is soft.     Tenderness: There is no abdominal tenderness. There is no guarding or rebound.  Musculoskeletal:     Cervical back: Neck supple.     Right lower leg: No edema.     Left lower leg: No edema.  Skin:    General: Skin is warm and dry.     Coloration: Skin is not jaundiced or pale.  Neurological:     General: No focal deficit present.     Mental Status: She is alert and oriented to person, place, and time. Mental status is at baseline.  Psychiatric:        Mood and Affect: Mood normal.        Behavior: Behavior normal.        Thought Content: Thought content normal.        Judgment: Judgment normal.      Assessment:  Ms. Cindy Hester is a 76 y.o. female  who presents today for Colonoscopy for screening  colonoscopy.  Plan:  Colonoscopy with possible intervention today  Colonoscopy with possible biopsy, control of bleeding, polypectomy, and interventions as necessary has been discussed with the patient/patient representative. Informed consent was obtained from the patient/patient representative after explaining the indication, nature, and risks of the procedure including but not limited to death, bleeding, perforation, missed neoplasm/lesions, cardiorespiratory compromise, and reaction to medications. Opportunity for questions was given and appropriate answers were provided. Patient/patient representative has verbalized understanding is amenable to undergoing the procedure.   [name], DO  [facility]  Portions of the record may have been created with voice recognition software. Occasional wrong-word or 'sound-a-like' substitutions may have occurred due to the inherent limitations of voice recognition software.  Read the chart carefully and recognize, using context, where substitutions may have occurred.

## 2021-07-17 ENCOUNTER — Ambulatory Visit: Payer: BC Managed Care – PPO | Admitting: Anesthesiology

## 2021-07-17 ENCOUNTER — Encounter: Payer: Self-pay | Admitting: Gastroenterology

## 2021-07-17 ENCOUNTER — Encounter
Admission: RE | Disposition: A | Discharge status: Home / Self Care | Payer: Self-pay | Source: Home / Self Care | Attending: Gastroenterology

## 2021-07-17 ENCOUNTER — Ambulatory Visit
Admission: RE | Admit: 2021-07-17 | Discharge: 2021-07-17 | Disposition: A | Discharge status: Home / Self Care | Payer: BC Managed Care – PPO | Attending: Gastroenterology | Admitting: Gastroenterology

## 2021-07-17 DIAGNOSIS — Z7984 Long term (current) use of oral hypoglycemic drugs: Secondary | ICD-10-CM | POA: Insufficient documentation

## 2021-07-17 DIAGNOSIS — D125 Benign neoplasm of sigmoid colon: Secondary | ICD-10-CM | POA: Diagnosis not present

## 2021-07-17 DIAGNOSIS — G4730 Sleep apnea, unspecified: Secondary | ICD-10-CM | POA: Diagnosis not present

## 2021-07-17 DIAGNOSIS — E1122 Type 2 diabetes mellitus with diabetic chronic kidney disease: Secondary | ICD-10-CM | POA: Diagnosis not present

## 2021-07-17 DIAGNOSIS — I440 Atrioventricular block, first degree: Secondary | ICD-10-CM | POA: Diagnosis not present

## 2021-07-17 DIAGNOSIS — N183 Chronic kidney disease, stage 3 unspecified: Secondary | ICD-10-CM | POA: Insufficient documentation

## 2021-07-17 DIAGNOSIS — E785 Hyperlipidemia, unspecified: Secondary | ICD-10-CM | POA: Diagnosis not present

## 2021-07-17 DIAGNOSIS — K5730 Diverticulosis of large intestine without perforation or abscess without bleeding: Secondary | ICD-10-CM | POA: Insufficient documentation

## 2021-07-17 DIAGNOSIS — Z7985 Long-term (current) use of injectable non-insulin antidiabetic drugs: Secondary | ICD-10-CM | POA: Insufficient documentation

## 2021-07-17 DIAGNOSIS — K649 Unspecified hemorrhoids: Secondary | ICD-10-CM | POA: Diagnosis not present

## 2021-07-17 DIAGNOSIS — K640 First degree hemorrhoids: Secondary | ICD-10-CM | POA: Insufficient documentation

## 2021-07-17 DIAGNOSIS — I890 Lymphedema, not elsewhere classified: Secondary | ICD-10-CM | POA: Insufficient documentation

## 2021-07-17 DIAGNOSIS — Z1211 Encounter for screening for malignant neoplasm of colon: Secondary | ICD-10-CM | POA: Insufficient documentation

## 2021-07-17 DIAGNOSIS — K635 Polyp of colon: Secondary | ICD-10-CM | POA: Diagnosis not present

## 2021-07-17 DIAGNOSIS — I129 Hypertensive chronic kidney disease with stage 1 through stage 4 chronic kidney disease, or unspecified chronic kidney disease: Secondary | ICD-10-CM | POA: Diagnosis not present

## 2021-07-17 DIAGNOSIS — I872 Venous insufficiency (chronic) (peripheral): Secondary | ICD-10-CM | POA: Insufficient documentation

## 2021-07-17 DIAGNOSIS — G709 Myoneural disorder, unspecified: Secondary | ICD-10-CM | POA: Insufficient documentation

## 2021-07-17 DIAGNOSIS — K219 Gastro-esophageal reflux disease without esophagitis: Secondary | ICD-10-CM | POA: Insufficient documentation

## 2021-07-17 HISTORY — PX: COLONOSCOPY: SHX5424

## 2021-07-17 LAB — GLUCOSE, CAPILLARY: Glucose-Capillary: 102 mg/dL — ABNORMAL HIGH (ref 70–99)

## 2021-07-17 SURGERY — COLONOSCOPY
Anesthesia: General

## 2021-07-17 MED ORDER — PROPOFOL 10 MG/ML IV BOLUS
INTRAVENOUS | Status: DC | PRN
  Administered 2021-07-17: 70 mg via INTRAVENOUS

## 2021-07-17 MED ORDER — PHENYLEPHRINE HCL (PRESSORS) 10 MG/ML IV SOLN
INTRAVENOUS | Status: DC | PRN
  Administered 2021-07-17: 80 ug via INTRAVENOUS

## 2021-07-17 MED ORDER — SODIUM CHLORIDE 0.9 % IV SOLN
INTRAVENOUS | Status: DC
  Administered 2021-07-17: 20 mL/h via INTRAVENOUS

## 2021-07-17 MED ORDER — PROPOFOL 500 MG/50ML IV EMUL
INTRAVENOUS | Status: DC | PRN
  Administered 2021-07-17: 140 ug/kg/min via INTRAVENOUS

## 2021-07-17 MED ORDER — LIDOCAINE HCL (CARDIAC) PF 100 MG/5ML IV SOSY
PREFILLED_SYRINGE | INTRAVENOUS | Status: DC | PRN
  Administered 2021-07-17: 60 mg via INTRAVENOUS

## 2021-07-17 NOTE — Interval H&P Note (Signed)
History and Physical Interval Note: Preprocedure H&P from 07/17/21  was reviewed and there was no interval change after seeing and examining the patient.  Written consent was obtained from the patient after discussion of risks, benefits, and alternatives. Patient has consented to proceed with Colonoscopy with possible intervention    07/17/2021 9:05 AM  Cindy Hester  has presented today for surgery, with the diagnosis of Encounter for screening colonoscopy for non-high-risk patient (Z12.11).  The various methods of treatment have been discussed with the patient and family. After consideration of risks, benefits and other options for treatment, the patient has consented to  Procedure(s) with comments: COLONOSCOPY (N/A) - DM as a surgical intervention.  [name] history has been reviewed, patient examined, no change in status, stable for surgery.  I have reviewed the patient's chart and labs.  Questions were answered to the patient's satisfaction.     [name]

## 2021-07-17 NOTE — Anesthesia Postprocedure Evaluation (Signed)
Anesthesia Post Note  Patient: Cindy Hester  Procedure(s) Performed: COLONOSCOPY  Patient location during evaluation: Endoscopy Anesthesia Type: General Level of consciousness: awake and alert Pain management: pain level controlled Vital Signs Assessment: post-procedure vital signs reviewed and stable Respiratory status: spontaneous breathing, nonlabored ventilation, respiratory function stable and patient connected to nasal cannula oxygen Cardiovascular status: blood pressure returned to baseline and stable Postop Assessment: no apparent nausea or vomiting Anesthetic complications: no   No notable events documented.   Last Vitals:  Vitals:   07/17/21 0842 07/17/21 1001  BP: 133/71 111/66  Pulse: 74   Resp: 20   Temp: (!) 35.9 C   SpO2: 100%     Last Pain:  Vitals:   07/17/21 1001  TempSrc:   PainSc: 0-No pain                 Precious Haws Sahid Borba

## 2021-07-17 NOTE — Op Note (Addendum)
[facility] Gastroenterology Patient Name: Cindy Hester Procedure Date: 07/17/2021 9:11 AM MRN: [id number] Account #: [id number] Date of Birth: [birth year] Admit Type: Outpatient Age: 75 Room: [facility] ENDO ROOM 2 Gender: Female Note Status: Addendum Instrument Name: Colonoscope [id number] Procedure:             Colonoscopy Indications:           Screening for colorectal malignant neoplasm Providers:             [name] DO, DO Referring MD:          Janine Ores. Rosanna Randy, MD (Referring MD) Medicines:             Monitored Anesthesia Care Complications:         No immediate complications. Estimated blood loss:                         Minimal. Procedure:             Pre-Anesthesia Assessment:                        - Prior to the procedure, a History and Physical was                         performed, and patient medications and allergies were                         reviewed. The patient is competent. The risks and                         benefits of the procedure and the sedation options and                         risks were discussed with the patient. All questions                         were answered and informed consent was obtained.                         Patient identification and proposed procedure were                         verified by the physician, the nurse, the anesthetist                         and the technician in the endoscopy suite. Mental                         Status Examination: alert and oriented. Airway                         Examination: normal oropharyngeal airway and neck                         mobility. Respiratory Examination: clear to                         auscultation. CV Examination: RRR, no murmurs, no S3  or S4. Prophylactic Antibiotics: The patient does not                         require prophylactic antibiotics. Prior                         Anticoagulants: The patient has taken no previous                          anticoagulant or antiplatelet agents. ASA Grade                         Assessment: III - A patient with severe systemic                         disease. After reviewing the risks and benefits, the                         patient was deemed in satisfactory condition to                         undergo the procedure. The anesthesia plan was to use                         monitored anesthesia care (MAC). Immediately prior to                         administration of medications, the patient was                         re-assessed for adequacy to receive sedatives. The                         heart rate, respiratory rate, oxygen saturations,                         blood pressure, adequacy of pulmonary ventilation, and                         response to care were monitored throughout the                         procedure. The physical status of the patient was                         re-assessed after the procedure.                        After obtaining informed consent, the colonoscope was                         passed under direct vision. Throughout the procedure,                         the patient's blood pressure, pulse, and oxygen                         saturations were monitored continuously. The  Colonoscope was introduced through the anus and                         advanced to the the terminal ileum, with                         identification of the appendiceal orifice and IC                         valve. The colonoscopy was performed without                         difficulty. The patient tolerated the procedure well.                         The quality of the bowel preparation was evaluated                         using the BBPS [facility] Bowel Preparation Scale) with                         scores of: Right Colon = 3, Transverse Colon = 3 and                         Left Colon = 3 (entire mucosa seen well with no                          residual staining, small fragments of stool or opaque                         liquid). The total BBPS score equals 9. The terminal                         ileum, ileocecal valve, appendiceal orifice, and                         rectum were photographed. Findings:      The perianal and digital rectal examinations were normal. Pertinent       negatives include normal sphincter tone.      The terminal ileum appeared normal. Estimated blood loss: none.      A few small-mouthed diverticula were found in the recto-sigmoid colon.       Estimated blood loss: none.      Non-bleeding internal hemorrhoids were found during retroflexion. The       hemorrhoids were Grade I (internal hemorrhoids that do not prolapse).       Estimated blood loss: none.      Normal mucosa was found in the entire colon. Biopsies for histology were       taken with a cold forceps from the right colon and left colon for       evaluation of microscopic colitis. Estimated blood loss was minimal.      The exam was otherwise without abnormality on direct and retroflexion       views. Impression:            - The examined portion of the ileum was normal.                        -  Diverticulosis in the recto-sigmoid colon.                        - Non-bleeding internal hemorrhoids.                        - Normal mucosa in the entire examined colon. Biopsied.                        - The examination was otherwise normal on direct and                         retroflexion views. Recommendation:        - Discharge patient to home.                        - Resume previous diet.                        - Continue present medications.                        - Await pathology results.                        - Repeat colonoscopy in 10 years for screening                         purposes.                        - May discontinue screening as will be beyond                         screening age. Defer to shared decision making with PCP                         - Return to GI clinic as previously scheduled.                        - The findings and recommendations were discussed with                         the patient. Procedure Code(s):     --- Professional ---                        [phone], Colonoscopy, flexible; with biopsy, single or                         multiple Diagnosis Code(s):     --- Professional ---                        Z12.11, Encounter for screening for malignant neoplasm                         of colon                        K64.0, First degree hemorrhoids  K57.30, Diverticulosis of large intestine without                         perforation or abscess without bleeding CPT copyright 2019 American Medical Association. All rights reserved. The codes documented in this report are preliminary and upon coder review may  be revised to meet current compliance requirements. Attending Participation:      I personally performed the entire procedure. Volney American, DO [name] DO, DO 07/17/2021 9:42:14 AM This report has been signed electronically. Number of Addenda: 1 Note Initiated On: 07/17/2021 9:11 AM Scope Withdrawal Time: 0 hours 14 minutes 42 seconds  Total Procedure Duration: 0 hours 19 minutes 48 seconds  Estimated Blood Loss:  Estimated blood loss was minimal.      [facility] Addendum Number: 1   Addendum Date: 07/20/2021 11:26:51 AM      Under Findings Correction:      a 1-2 mm sessile polyp was seen in the sigmoid colon and removed with       cold jumbo forceps. Resection and retrival complete. Estimated blood       loss: Minimal.      Impression:      small 1-2 mm sessile polyp in sigmoid removed and retrieved in totality. Volney American, DO [name] DO, DO 07/20/2021 11:28:58 AM This report has been signed electronically.

## 2021-07-17 NOTE — Transfer of Care (Signed)
Immediate Anesthesia Transfer of Care Note  Patient: Cindy Hester  Procedure(s) Performed: COLONOSCOPY  Patient Location: PACU  Anesthesia Type:General  Level of Consciousness: awake, alert  and oriented  Airway & Oxygen Therapy: Patient Spontanous Breathing  Post-op Assessment: Report given to RN and Post -op Vital signs reviewed and stable  Post vital signs: Reviewed and stable  Last Vitals:  Vitals Value Taken Time  BP    Temp    Pulse    Resp    SpO2      Last Pain:  Vitals:   07/17/21 0842  TempSrc: Temporal  PainSc: 4          Complications: No notable events documented.

## 2021-07-17 NOTE — Anesthesia Preprocedure Evaluation (Signed)
Anesthesia Evaluation  Patient identified by MRN, date of birth, ID band Patient awake    Reviewed: Allergy & Precautions, NPO status , Patient's Chart, lab work & pertinent test results  History of Anesthesia Complications Negative for: history of anesthetic complications  Airway Mallampati: III  TM Distance: <3 FB Neck ROM: full    Dental  (+) Chipped   Pulmonary neg shortness of breath, sleep apnea ,    Pulmonary exam normal        Cardiovascular Exercise Tolerance: Good hypertension, (-) anginaNormal cardiovascular exam+ dysrhythmias      Neuro/Psych  Neuromuscular disease negative psych ROS   GI/Hepatic Neg liver ROS, GERD  Controlled,  Endo/Other  diabetesHypothyroidism   Renal/GU negative Renal ROS  negative genitourinary   Musculoskeletal   Abdominal   Peds  Hematology negative hematology ROS (+)   Anesthesia Other Findings Past Medical History: No date: 1st degree AV block No date: Arthritis     Comment:  right shoulder blade, left thumb No date: Carotid arterial disease ([place])     Comment:  a. 10/2020 U/[place]: RICA 23-53%, LICA 61-44%. Antegrade bilat              vertebral flow. No date: Chronic venous insufficiency No date: CKD (chronic kidney disease), stage III (HCC) No date: Diabetes mellitus without complication (HCC) No date: GERD (gastroesophageal reflux disease) No date: Heart murmur     Comment:  a. 12/2017 Echo: EF 50-55%, mild conc LVH, GrI DD, Trace              MR/TR. No date: History of esophagitis No date: History of gastritis No date: History of stress test     Comment:  a. 12/2017 MV: HTN response to exercise. EF 68%. Small,               mild, reversible inferior apical defect-->Low risk. No date: HLD (hyperlipidemia) No date: Hypertension No date: Lymphedema No date: Sleep apnea     Comment:  a. Uses CPAP  Past Surgical History: No date: BREAST CYST EXCISION; Left      Comment:  removed two times 1986: CESAREAN SECTION No date: CYST EXCISION     Comment:  from left breast 12/12/2015: ESOPHAGOGASTRODUODENOSCOPY (EGD) WITH PROPOFOL; N/A     Comment:  gastritis, LA Grade A reflux esophagitis               ESOPHAGOGASTRODUODENOSCOPY (EGD) WITH PROPOFOL;  Surgeon:              Manya Silvas, MD;  Location: [facility] ENDOSCOPY;                Service: Endoscopy;  Laterality: N/A;  Diabetic 05/30/2016: HAMMER TOE SURGERY; Bilateral     Comment:  Procedure: HAMMER TOE CORRECTION RIGHT 2ND, 3RD, 4TH,               5TH TOES LEFT 5TH TOE;  Surgeon: Samara Deist, DPM;                Location: [facility];  Service: Podiatry;                Laterality: Bilateral; 2011: HEEL SPUR SURGERY; Right No date: NASAL SINUS SURGERY No date: TONSILLECTOMY  BMI    Body Mass Index: 28.19 kg/m      Reproductive/Obstetrics (+) Breast feeding  Anesthesia Physical Anesthesia Plan  ASA: 3  Anesthesia Plan: General   Post-op Pain Management:    Induction: Intravenous  PONV Risk Score and Plan: Propofol infusion and TIVA  Airway Management Planned: Natural Airway and Nasal Cannula  Additional Equipment:   Intra-op Plan:   Post-operative Plan:   Informed Consent: I have reviewed the patients History and Physical, chart, labs and discussed the procedure including the risks, benefits and alternatives for the proposed anesthesia with the patient or authorized representative who has indicated his/her understanding and acceptance.     Dental Advisory Given  Plan Discussed with: Anesthesiologist, CRNA and Surgeon  Anesthesia Plan Comments: (Patient consented for risks of anesthesia including but not limited to:  - adverse reactions to medications - risk of airway placement if required - damage to eyes, teeth, lips or other oral mucosa - nerve damage due to positioning  - sore throat or hoarseness -  Damage to heart, brain, nerves, lungs, other parts of body or loss of life  Patient voiced understanding.)        Anesthesia Quick Evaluation

## 2021-07-18 ENCOUNTER — Encounter: Payer: Self-pay | Admitting: Gastroenterology

## 2021-07-18 DIAGNOSIS — G4730 Sleep apnea, unspecified: Secondary | ICD-10-CM | POA: Diagnosis not present

## 2021-07-18 LAB — SURGICAL PATHOLOGY

## 2021-07-21 DIAGNOSIS — J3089 Other allergic rhinitis: Secondary | ICD-10-CM | POA: Diagnosis not present

## 2021-07-27 DIAGNOSIS — J3089 Other allergic rhinitis: Secondary | ICD-10-CM | POA: Diagnosis not present

## 2021-07-27 DIAGNOSIS — J301 Allergic rhinitis due to pollen: Secondary | ICD-10-CM | POA: Diagnosis not present

## 2021-08-01 DIAGNOSIS — J301 Allergic rhinitis due to pollen: Secondary | ICD-10-CM | POA: Diagnosis not present

## 2021-08-01 DIAGNOSIS — J3089 Other allergic rhinitis: Secondary | ICD-10-CM | POA: Diagnosis not present

## 2021-08-07 ENCOUNTER — Other Ambulatory Visit: Payer: Self-pay | Admitting: Family Medicine

## 2021-08-07 DIAGNOSIS — K121 Other forms of stomatitis: Secondary | ICD-10-CM

## 2021-08-10 DIAGNOSIS — J301 Allergic rhinitis due to pollen: Secondary | ICD-10-CM | POA: Diagnosis not present

## 2021-08-10 DIAGNOSIS — J3089 Other allergic rhinitis: Secondary | ICD-10-CM | POA: Diagnosis not present

## 2021-08-15 DIAGNOSIS — J301 Allergic rhinitis due to pollen: Secondary | ICD-10-CM | POA: Diagnosis not present

## 2021-08-15 DIAGNOSIS — J3089 Other allergic rhinitis: Secondary | ICD-10-CM | POA: Diagnosis not present

## 2021-09-05 ENCOUNTER — Telehealth: Payer: Self-pay

## 2021-09-05 DIAGNOSIS — E1143 Type 2 diabetes mellitus with diabetic autonomic (poly)neuropathy: Secondary | ICD-10-CM | POA: Diagnosis not present

## 2021-09-05 DIAGNOSIS — R140 Abdominal distension (gaseous): Secondary | ICD-10-CM | POA: Diagnosis not present

## 2021-09-05 DIAGNOSIS — K6389 Other specified diseases of intestine: Secondary | ICD-10-CM | POA: Diagnosis not present

## 2021-09-05 DIAGNOSIS — K5900 Constipation, unspecified: Secondary | ICD-10-CM | POA: Diagnosis not present

## 2021-09-06 ENCOUNTER — Ambulatory Visit: Payer: BC Managed Care – PPO | Admitting: Dermatology

## 2021-09-06 DIAGNOSIS — L988 Other specified disorders of the skin and subcutaneous tissue: Secondary | ICD-10-CM

## 2021-09-06 DIAGNOSIS — L820 Inflamed seborrheic keratosis: Secondary | ICD-10-CM

## 2021-09-06 NOTE — Patient Instructions (Signed)
Due to recent changes in healthcare laws, you may see results of your pathology and/or laboratory studies on MyChart before the doctors have had a chance to review them. We understand that in some cases there may be results that are confusing or concerning to you. Please understand that not all results are received at the same time and often the doctors may need to interpret multiple results in order to provide you with the best plan of care or course of treatment. Therefore, we ask that you please give us 2 business days to thoroughly review all your results before contacting the office for clarification. Should we see a critical lab result, you will be contacted sooner.   If You Need Anything After Your Visit  If you have any questions or concerns for your doctor, please call our main line at [phone] and press option 4 to reach your doctor's medical assistant. If no one answers, please leave a voicemail as directed and we will return your call as soon as possible. Messages left after 4 pm will be answered the following business day.   You may also send us a message via MyChart. We typically respond to MyChart messages within 1-2 business days.  For prescription refills, please ask your pharmacy to contact our office. Our fax number is [phone].  If you have an urgent issue when the clinic is closed that cannot wait until the next business day, you can page your doctor at the number below.    Please note that while we do our best to be available for urgent issues outside of office hours, we are not available 24/7.   If you have an urgent issue and are unable to reach us, you may choose to seek medical care at your doctor's office, retail clinic, urgent care center, or emergency room.  If you have a medical emergency, please immediately call 911 or go to the emergency department.  Pager Numbers  - Dr. Kowalski: [phone]  - Dr. Moye: [phone]  - Dr. Stewart:  [phone]  In the event of inclement weather, please call our main line at [phone] for an update on the status of any delays or closures.  Dermatology Medication Tips: Please keep the boxes that topical medications come in in order to help keep track of the instructions about where and how to use these. Pharmacies typically print the medication instructions only on the boxes and not directly on the medication tubes.   If your medication is too expensive, please contact our office at [phone] option 4 or send us a message through MyChart.   We are unable to tell what your co-pay for medications will be in advance as this is different depending on your insurance coverage. However, we may be able to find a substitute medication at lower cost or fill out paperwork to get insurance to cover a needed medication.   If a prior authorization is required to get your medication covered by your insurance company, please allow us 1-2 business days to complete this process.  Drug prices often vary depending on where the prescription is filled and some pharmacies may offer cheaper prices.  The website www.goodrx.com contains coupons for medications through different pharmacies. The prices here do not account for what the cost may be with help from insurance (it may be cheaper with your insurance), but the website can give you the price if you did not use any insurance.  - You can print the associated coupon and take it with   your prescription to the pharmacy.  - You may also stop by our office during regular business hours and pick up a GoodRx coupon card.  - If you need your prescription sent electronically to a different pharmacy, notify our office through [facility] MyChart or by phone at [phone] option 4.     Si Usted Necesita Algo Despus de Su Visita  Tambin puede enviarnos un mensaje a travs de MyChart. Por lo general respondemos a los mensajes de MyChart en el transcurso de 1 a 2  das hbiles.  Para renovar recetas, por favor pida a su farmacia que se ponga en contacto con nuestra oficina. Nuestro nmero de fax es el [phone].  Si tiene un asunto urgente cuando la clnica est cerrada y que no puede esperar hasta el siguiente da hbil, puede llamar/localizar a su doctor(a) al nmero que aparece a continuacin.   Por favor, tenga en cuenta que aunque hacemos todo lo posible para estar disponibles para asuntos urgentes fuera del horario de oficina, no estamos disponibles las 24 horas del da, los 7 das de la semana.   Si tiene un problema urgente y no puede comunicarse con nosotros, puede optar por buscar atencin mdica  en el consultorio de su doctor(a), en una clnica privada, en un centro de atencin urgente o en una sala de emergencias.  Si tiene una emergencia mdica, por favor llame inmediatamente al 911 o vaya a la sala de emergencias.  Nmeros de bper  - Dr. Kowalski: [phone]  - Dra. Moye: [phone]  - Dra. Stewart: [phone]  En caso de inclemencias del tiempo, por favor llame a nuestra lnea principal al [phone] para una actualizacin sobre el estado de cualquier retraso o cierre.  Consejos para la medicacin en dermatologa: Por favor, guarde las cajas en las que vienen los medicamentos de uso tpico para ayudarle a seguir las instrucciones sobre dnde y cmo usarlos. Las farmacias generalmente imprimen las instrucciones del medicamento slo en las cajas y no directamente en los tubos del medicamento.   Si su medicamento es muy caro, por favor, pngase en contacto con nuestra oficina llamando al [phone] y presione la opcin 4 o envenos un mensaje a travs de MyChart.   No podemos decirle cul ser su copago por los medicamentos por adelantado ya que esto es diferente dependiendo de la cobertura de su seguro. Sin embargo, es posible que podamos encontrar un medicamento sustituto a menor costo o llenar un formulario para que el  seguro cubra el medicamento que se considera necesario.   Si se requiere una autorizacin previa para que su compaa de seguros cubra su medicamento, por favor permtanos de 1 a 2 das hbiles para completar este proceso.  Los precios de los medicamentos varan con frecuencia dependiendo del lugar de dnde se surte la receta y alguna farmacias pueden ofrecer precios ms baratos.  El sitio web www.goodrx.com tiene cupones para medicamentos de diferentes farmacias. Los precios aqu no tienen en cuenta lo que podra costar con la ayuda del seguro (puede ser ms barato con su seguro), pero el sitio web puede darle el precio si no utiliz ningn seguro.  - Puede imprimir el cupn correspondiente y llevarlo con su receta a la farmacia.  - Tambin puede pasar por nuestra oficina durante el horario de atencin regular y recoger una tarjeta de cupones de GoodRx.  - Si necesita que su receta se enve electrnicamente a una farmacia diferente, informe a nuestra oficina a travs de MyChart de [place]   o por telfono llamando al [phone] y presione la opcin 4.  

## 2021-09-06 NOTE — Progress Notes (Unsigned)
Follow-Up Visit   Subjective  Cindy Hester is a 76 y.o. female who presents for the following: Facial Elastosis (Patient is here today for Botox and fillers) and Irregular skin lesion (Of the L lat thigh - previously treated with LN2 per patient. She is concerned and would like it checked again today).  The following portions of the chart were reviewed this encounter and updated as appropriate:   Tobacco  Allergies  Meds  Problems  Med Hx  Surg Hx  Fam Hx     Review of Systems:  No other skin or systemic complaints except as noted in HPI or Assessment and Plan.  Objective  Well appearing patient in no apparent distress; mood and affect are within normal limits.  A focused examination was performed including the face and legs. Relevant physical exam findings are noted in the Assessment and Plan.  L lat thigh x 2 (2) Erythematous stuck-on, waxy papule or plaque  Face Rhytides and volume loss.                          Assessment & Plan  Inflamed seborrheic keratosis (2) L lat thigh x 2  Symptomatic, irritating, patient would like treated.   Destruction of lesion - L lat thigh x 2 Complexity: simple   Destruction method: cryotherapy   Informed consent: discussed and consent obtained   Timeout:  patient name, date of birth, surgical site, and procedure verified Lesion destroyed using liquid nitrogen: Yes   Region frozen until ice ball extended beyond lesion: Yes   Outcome: patient tolerated procedure well with no complications   Post-procedure details: wound care instructions given    Elastosis of skin Face  Botox Injection - Face Location: See attached image  Informed consent: Discussed risks (infection, pain, bleeding, bruising, swelling, allergic reaction, paralysis of nearby muscles, eyelid droop, double vision, neck weakness, difficulty breathing, headache, undesirable cosmetic result, and need for additional treatment) and benefits of  the procedure, as well as the alternatives.  Informed consent was obtained.  Preparation: The area was cleansed with alcohol.  Procedure Details:  Botox was injected into the dermis with a 30-gauge needle. Pressure applied to any bleeding. Ice packs offered for swelling.  Lot Number:  [id number] Expiration:  10/2023  Total Units Injected:  48  Plan: Patient was instructed to remain upright for 4 hours. Patient was instructed to avoid massaging the face and avoid vigorous exercise for the rest of the day. Tylenol may be used for headache.  Allow 2 weeks before returning to clinic for additional dosing as needed. Patient will call for any problems.   Filling material injection - Face Prior to the procedure, the patient's past medical history, allergies and the rare but potential risks and complications were reviewed with the patient and a signed consent was obtained. Pre and post-treatment care was discussed and instructions provided.  Location: perioral, nasolabial folds, marionette lines, and chin  Filler Type: Restylane refyne  Lot number: [id number]  Expiration date: 06/05/2022  Procedure: The area was prepped thoroughly with Puracyn. After introducing the needle into the desired treatment area, the syringe plunger was drawn back to ensure there was no flash of blood prior to injecting the filler in order to minimize risk of intravascular injection and vascular occlusion. After injection of the filler, the treated areas were cleansed and iced to reduce swelling. Post-treatment instructions were reviewed with the patient.       Patient tolerated  the procedure well. The patient will call with any problems, questions or concerns prior to their next appointment.   Seborrheic Keratoses - Stuck-on, waxy, tan-brown papules and/or plaques  - Benign-appearing - Discussed benign etiology and prognosis. - Observe - Call for any changes  Return in about 4 months (around 01/06/2022) for Botox  injections and possible fillers.  Luther Redo, CMA, am acting as scribe for Sarina Ser, MD . Documentation: I have reviewed the above documentation for accuracy and completeness, and I agree with the above.  Sarina Ser, MD

## 2021-09-07 ENCOUNTER — Encounter: Payer: Self-pay | Admitting: Dermatology

## 2021-09-07 DIAGNOSIS — J301 Allergic rhinitis due to pollen: Secondary | ICD-10-CM | POA: Diagnosis not present

## 2021-09-07 DIAGNOSIS — J3089 Other allergic rhinitis: Secondary | ICD-10-CM | POA: Diagnosis not present

## 2021-09-12 DIAGNOSIS — J3089 Other allergic rhinitis: Secondary | ICD-10-CM | POA: Diagnosis not present

## 2021-09-12 DIAGNOSIS — M1612 Unilateral primary osteoarthritis, left hip: Secondary | ICD-10-CM | POA: Diagnosis not present

## 2021-09-12 DIAGNOSIS — J301 Allergic rhinitis due to pollen: Secondary | ICD-10-CM | POA: Diagnosis not present

## 2021-09-15 ENCOUNTER — Telehealth: Payer: Self-pay

## 2021-09-15 NOTE — Telephone Encounter (Signed)
Copied from [place] [phone]. Topic: Medical Record Request - Patient ROI Request >> Sep 15, 2021 11:22 AM Everette C wrote: Reason for CRM: The patient has called to request all information related to their visit with Prov. Drubel on 03/20/21  [name] insurance company is requesting additional information and the patient would like to provide it to them directly   The patient would like the information printed and mailed to them if possible   Please contact the patient further when possible

## 2021-09-17 NOTE — Discharge Instructions (Addendum)
Instructions after Total Hip Replacement     Cindy Hester., M.D.     Dept. of [facility]  [place], [place]  [address]  Phone: [phone]   Fax: [phone]    DIET: Drink plenty of non-alcoholic fluids. Resume your normal diet. Include foods high in fiber.  ACTIVITY:  You may use crutches or a walker with weight-bearing as tolerated, unless instructed otherwise. You may be weaned off of the walker or crutches by your Physical Therapist.  Do NOT reach below the level of your knees or cross your legs until allowed.    Continue doing gentle exercises. Exercising will reduce the pain and swelling, increase motion, and prevent muscle weakness.   Please continue to use the TED compression stockings for 6 weeks. You may remove the stockings at night, but should reapply them in the morning. Do not drive or operate any equipment until instructed.  WOUND CARE:  Continue to use ice packs periodically to reduce pain and swelling. Keep the incision clean and dry. You may bathe or shower after the staples are removed at the first office visit following surgery.  MEDICATIONS: You may resume your regular medications. Please take the pain medication as prescribed on the medication. Do not take pain medication on an empty stomach. You have been given a prescription for a blood thinner to prevent blood clots. Please take the medication as instructed. (NOTE: After completing a 2 week course of Lovenox, take one Enteric-coated aspirin once a day.) Pain medications and iron supplements can cause constipation. Use a stool softener (Senokot or Colace) on a daily basis and a laxative (dulcolax or miralax) as needed. Do not drive or drink alcoholic beverages when taking pain medications. Please hold your blood pressure medications (Norvasc, hydrochlorothiazide, irbesartan) until Monday.  Monday, 10/02/2021 you may resume your blood pressure  medications.  CALL THE OFFICE FOR: Temperature above 101 degrees Excessive bleeding or drainage on the dressing. Excessive swelling, coldness, or paleness of the toes. Persistent nausea and vomiting.  FOLLOW-UP:  You should have an appointment to return to the office in 6 weeks after surgery. Arrangements have been made for continuation of Physical Therapy (either home therapy or outpatient therapy).    [facility] Directory         [email or web address]       [email or web address]          Cardiology  Appointments: [place] [phone]  Endocrinology  Appointments: [place] [phone] [place] [phone]  Gastroenterology  Appointments: [place] [phone] [place] [phone]        General Surgery   Appointments: [facility]  Internal Medicine/Family Medicine  Appointments: [facility] [place] - [phone] [place] [phone]  Metabolic and Williams Loss Surgery  Appointments: [facility]        Neurology  Appointments: [place] [phone] [place] - [phone]  Neurosurgery  Appointments: [place]  Obstetrics & Gynecology  Appointments: [place] [phone] [place] - [phone]        Pediatrics  Appointments: [place] [phone] [place] - [place]  Appointments: [place] [phone]  Physical Therapy  Appointments: [place] - [phone]        Podiatry  Appointments: [place] [phone] [place] - [phone]  Pulmonology  Appointments: [place]  Rheumatology  Appointments: [place] [phone]        [place] Location: [facility]  [phone]  [place], [place]  [address]  [place] Location: [facility]. [address] [place], [place]  [address]  [place] Location: [facility] [address] [place], [place]  [address]

## 2021-09-18 ENCOUNTER — Telehealth: Payer: Self-pay

## 2021-09-18 NOTE — Telephone Encounter (Signed)
Copied from [place] [id number]. Topic: Medical Record Request - Patient ROI Request >> Sep 15, 2021 11:22 AM Cindy Hester wrote: Reason for CRM: The patient has called to request all information related to their visit with Prov. Drubel on 03/20/21  [name] insurance company is requesting additional information and the patient would like to provide it to them directly   The patient would like the information printed and mailed to them if possible   Please contact the patient further when possible

## 2021-09-19 ENCOUNTER — Other Ambulatory Visit: Payer: Self-pay | Admitting: Cardiology

## 2021-09-19 DIAGNOSIS — I6523 Occlusion and stenosis of bilateral carotid arteries: Secondary | ICD-10-CM

## 2021-09-20 ENCOUNTER — Encounter
Admission: RE | Admit: 2021-09-20 | Discharge: 2021-09-20 | Disposition: A | Discharge status: Home / Self Care | Payer: BC Managed Care – PPO | Source: Ambulatory Visit | Attending: Orthopedic Surgery | Admitting: Orthopedic Surgery

## 2021-09-20 VITALS — BP 98/47 | HR 73 | Resp 16 | Ht 66.5 in | Wt 165.0 lb

## 2021-09-20 DIAGNOSIS — E785 Hyperlipidemia, unspecified: Secondary | ICD-10-CM | POA: Insufficient documentation

## 2021-09-20 DIAGNOSIS — M1612 Unilateral primary osteoarthritis, left hip: Secondary | ICD-10-CM | POA: Diagnosis not present

## 2021-09-20 DIAGNOSIS — E039 Hypothyroidism, unspecified: Secondary | ICD-10-CM | POA: Insufficient documentation

## 2021-09-20 DIAGNOSIS — N1832 Chronic kidney disease, stage 3b: Secondary | ICD-10-CM | POA: Insufficient documentation

## 2021-09-20 DIAGNOSIS — G8929 Other chronic pain: Secondary | ICD-10-CM | POA: Diagnosis not present

## 2021-09-20 DIAGNOSIS — M1712 Unilateral primary osteoarthritis, left knee: Secondary | ICD-10-CM | POA: Diagnosis not present

## 2021-09-20 DIAGNOSIS — E1142 Type 2 diabetes mellitus with diabetic polyneuropathy: Secondary | ICD-10-CM | POA: Diagnosis not present

## 2021-09-20 DIAGNOSIS — Z01818 Encounter for other preprocedural examination: Secondary | ICD-10-CM | POA: Diagnosis not present

## 2021-09-20 DIAGNOSIS — R600 Localized edema: Secondary | ICD-10-CM | POA: Insufficient documentation

## 2021-09-20 DIAGNOSIS — M25562 Pain in left knee: Secondary | ICD-10-CM | POA: Diagnosis not present

## 2021-09-20 DIAGNOSIS — N179 Acute kidney failure, unspecified: Secondary | ICD-10-CM | POA: Diagnosis not present

## 2021-09-20 DIAGNOSIS — Z794 Long term (current) use of insulin: Secondary | ICD-10-CM | POA: Insufficient documentation

## 2021-09-20 LAB — CBC WITH DIFFERENTIAL/PLATELET
Abs Immature Granulocytes: 0.03 10*3/uL (ref 0.00–0.07)
Basophils Absolute: 0 10*3/uL (ref 0.0–0.1)
Basophils Relative: 1 %
Eosinophils Absolute: 0.1 10*3/uL (ref 0.0–0.5)
Eosinophils Relative: 1 %
HCT: 42.8 % (ref 36.0–46.0)
Hemoglobin: 14.2 g/dL (ref 12.0–15.0)
Immature Granulocytes: 0 %
Lymphocytes Relative: 16 %
Lymphs Abs: 1.3 10*3/uL (ref 0.7–4.0)
MCH: 29 pg (ref 26.0–34.0)
MCHC: 33.2 g/dL (ref 30.0–36.0)
MCV: 87.5 fL (ref 80.0–100.0)
Monocytes Absolute: 0.6 10*3/uL (ref 0.1–1.0)
Monocytes Relative: 7 %
Neutro Abs: 6.4 10*3/uL (ref 1.7–7.7)
Neutrophils Relative %: 75 %
Platelets: 331 10*3/uL (ref 150–400)
RBC: 4.89 MIL/uL (ref 3.87–5.11)
RDW: 13.6 % (ref 11.5–15.5)
WBC: 8.4 10*3/uL (ref 4.0–10.5)
nRBC: 0 % (ref 0.0–0.2)

## 2021-09-20 LAB — COMPREHENSIVE METABOLIC PANEL
ALT: 17 U/L (ref 0–44)
AST: 20 U/L (ref 15–41)
Albumin: 4.9 g/dL (ref 3.5–5.0)
Alkaline Phosphatase: 61 U/L (ref 38–126)
Anion gap: 11 (ref 5–15)
BUN: 28 mg/dL — ABNORMAL HIGH (ref 8–23)
CO2: 29 mmol/L (ref 22–32)
Calcium: 9.9 mg/dL (ref 8.9–10.3)
Chloride: 101 mmol/L (ref 98–111)
Creatinine, Ser: 1.08 mg/dL — ABNORMAL HIGH (ref 0.44–1.00)
GFR, Estimated: 54 mL/min — ABNORMAL LOW (ref >60)
Glucose, Bld: 114 mg/dL — ABNORMAL HIGH (ref 70–99)
Potassium: 3.9 mmol/L (ref 3.5–5.1)
Sodium: 141 mmol/L (ref 135–145)
Total Bilirubin: 0.9 mg/dL (ref 0.3–1.2)
Total Protein: 7.8 g/dL (ref 6.5–8.1)

## 2021-09-20 LAB — TYPE AND SCREEN
ABO/RH(D): A POS
Antibody Screen: NEGATIVE

## 2021-09-20 LAB — URINALYSIS, ROUTINE W REFLEX MICROSCOPIC
Bacteria, UA: NONE SEEN
Bilirubin Urine: NEGATIVE
Glucose, UA: >=500 mg/dL — AB
Hgb urine dipstick: NEGATIVE
Ketones, ur: NEGATIVE mg/dL
Nitrite: NEGATIVE
Protein, ur: NEGATIVE mg/dL
Specific Gravity, Urine: 1.006 (ref 1.005–1.030)
pH: 5 (ref 5.0–8.0)

## 2021-09-20 LAB — HEMOGLOBIN A1C
Hgb A1c MFr Bld: 6.1 % — ABNORMAL HIGH (ref 4.8–5.6)
Mean Plasma Glucose: 128.37 mg/dL

## 2021-09-20 LAB — SURGICAL PCR SCREEN
MRSA, PCR: NEGATIVE
Staphylococcus aureus: NEGATIVE

## 2021-09-20 LAB — C-REACTIVE PROTEIN: CRP: 0.5 mg/dL (ref <1.0)

## 2021-09-20 LAB — SEDIMENTATION RATE: Sed Rate: 16 mm/hr (ref 0–30)

## 2021-09-20 NOTE — Patient Instructions (Signed)
Your procedure is scheduled on: 09/27/21 Report to [place]. To find out your arrival time please call [phone] between 1PM - 3PM on 09/26/21.  Remember: Instructions that are not followed completely may result in serious medical risk, up to and including death, or upon the discretion of your surgeon and anesthesiologist your surgery may need to be rescheduled.     _X__ 1. Do not eat food after midnight the night before your procedure.                 No gum chewing or hard candies. You may drink clear liquids up to 2 hours                 before you are scheduled to arrive for your surgery- DO not drink clear                 liquids within 2 hours of the start of your surgery.                 Clear Liquids include: Diabetics water only  DRINK THE G2 GATORADE 2 HOURS BEFORE ARRIVING FOR SURGERY  __X__2.  On the morning of surgery brush your teeth with toothpaste and water, you                 may rinse your mouth with mouthwash if you wish.  Do not swallow any              toothpaste of mouthwash.     _X__ 3.  No Alcohol for 24 hours before or after surgery.   _X__ 4.  Do Not Smoke or use e-cigarettes For 24 Hours Prior to Your Surgery.                 Do not use any chewable tobacco products for at least 6 hours prior to                 surgery.  ____  5.  Bring all medications with you on the day of surgery if instructed.   __X__  6.  Notify your doctor if there is any change in your medical condition      (cold, fever, infections).     Do not wear jewelry, make-up, hairpins, clips or nail OR TOENAIL polish. Do not wear lotions, powders, or perfumes.  Do not shave body hair 48 hours prior to surgery. Men may shave face and neck. Do not bring valuables to the hospital.    [facility] is not responsible for any belongings or valuables.  Contacts, dentures/partials or body piercings may not be worn into surgery. Bring a  case for your contacts, glasses or hearing aids, a denture cup will be supplied. Leave your suitcase in the car. After surgery it may be brought to your room. For patients admitted to the hospital, discharge time is determined by your treatment team.   Patients discharged the day of surgery will not be allowed to drive home.   Please read over the following fact sheets that you were given:   MRSA Information, CHG soap, Incentive Spirometer, G2 Gatorade  __X__ Take these medicines the morning of surgery with A SIP OF WATER:    1. amLODipine (NORVASC) 10 MG tablet  2. metoprolol (TOPROL-XL) 200 MG 24 hr tablet  3. montelukast (SINGULAIR) 10 MG tablet  4. pantoprazole (PROTONIX) 40 MG tablet  5.  6.  ____ [employer]  Enema (as directed)   __X__ Use CHG Soap/SAGE wipes as directed  ____ Use inhalers on the day of surgery  __X__ Stop metformin/Janumet/Farxiga 2 days prior to surgery STOP JARDIANCE 3 DAYS PRIOR TO SURGERY LAST DOSE SUNDAY 09/24/21   ____ Take 1/2 of usual insulin dose the night before surgery. No insulin the morning          of surgery.   ____ Stop Blood Thinners Coumadin/Plavix/Xarelto/Pleta/Pradaxa/Eliquis/Effient/Aspirin  on   Or contact your Surgeon, Cardiologist or Medical Doctor regarding  ability to stop your blood thinners  __X__ Stop Anti-inflammatories 7 days before surgery such as Advil, Ibuprofen, Motrin,  BC or Goodies Powder, Naprosyn, Naproxen, Aleve, Aspirin    __X__ Stop all herbals and supplements, fish oil or vitamins  until after surgery.  STOP TODAY 09/20/21  __X__ Bring C-Pap to the hospital.    [place] WAS ON 09/18/21

## 2021-09-22 DIAGNOSIS — J3089 Other allergic rhinitis: Secondary | ICD-10-CM | POA: Diagnosis not present

## 2021-09-22 DIAGNOSIS — J301 Allergic rhinitis due to pollen: Secondary | ICD-10-CM | POA: Diagnosis not present

## 2021-09-23 ENCOUNTER — Other Ambulatory Visit: Payer: Self-pay | Admitting: Cardiology

## 2021-09-24 NOTE — H&P (Signed)
ORTHOPAEDIC HISTORY & PHYSICAL Cindy Hester, [place] - 09/20/2021 3:45 PM EDT Formatting of this note is different from the original. [place] [facility] Chief Complaint:   Chief Complaint  Patient presents with  Knee Pain  Left knee degenerative arthrosis   History of Present Illness:   Cindy Hester is a 76 y.o. female that presents to clinic today for management of  ICD-10-CM  1. Chronic pain of left knee M25.562  G89.29  2. Primary osteoarthritis of left knee M17.12  . After reviewing treatment options for osteoarthritis of the knee, the patient has chosen to proceed with steroid injection. Patient presents for cortisone injection to the left knee.   Patient reports history surgery to both feet, unable to actively extend the toes of the left foot.   Past Medical, Surgical, Family, Social History, Allergies, Medications:  Past Medical History:  Past Medical History:  Diagnosis Date  Chronic sinusitis  GERD (gastroesophageal reflux disease)  Heart murmur, unspecified  Herpes genitalis  High cholesterol  Hypertension  Obesity  Osteoarthritis  Sleep apnea  Type 2 diabetes mellitus (CMS-HCC)  Vitamin D deficiency   Past Surgical History:  Past Surgical History:  Procedure Laterality Date  COLONOSCOPY 01/15/2011  Normal Colon: CBF 01/2021  EGD 12/12/2015  Gastritis, Esophagitis: No repeat per RTE  Colon @ [facility] 07/17/2021  Tubular adenoma/Repeat 64yr/SMR  CESAREAN SECTION  Deviated Septum Repair with nasal reconstructive surgery  Eye lid surgery  TONSILLECTOMY  Uvulectomy   Current Medications:  Current Outpatient Medications  Medication Sig Dispense Refill  acetaminophen (TYLENOL) 500 MG tablet Take 1,000 mg by mouth every 8 (eight) hours as needed  amLODIPine (NORVASC) 10 MG tablet Take 10 mg by mouth once daily  aspirin 81 MG EC tablet Take 81 mg by mouth once daily  azelastine (OPTIVAR) 0.05 % ophthalmic solution 1  drop into affected eye  cholecalciferol (VITAMIN D3) 1000 unit tablet Take by mouth Take 5,000 Units by mouth. Twice a week  cyanocobalamin, vitamin B-12, 5,000 mcg TbDL Take 5,000 mcg by mouth twice a week  docusate (COLACE) 100 MG capsule Take 1 capsule (100 mg total) by mouth 2 (two) times daily for 90 days 60 capsule 2  empagliflozin (JARDIANCE) 25 mg tablet TAKE 1 TABLET(25 MG) BY MOUTH EVERY MORNING BEFORE BREAKFAST 90 tablet 0  EPINEPHrine (EPIPEN) 0.3 mg/0.3 mL pen injector epinephrine 0.3 mg/0.3 mL injection, auto-injector INJ UTD  ezetimibe (ZETIA) 10 mg tablet Take by mouth  ezetimibe (ZETIA) 10 mg tablet Take 10 mg by mouth once daily  FUROsemide (LASIX) 40 MG tablet Take 20 mg by mouth once daily as needed  glucosamine sulfate 2KCl 1,000 mg Tab Take 2 tablets by mouth once daily  HAIR, SKIN AND NAILS, BIOTIN, ORAL Take 2 tablets by mouth once daily  lactulose (ENULOSE) 10 gram/15 mL oral solution Take 20 mLs by mouth 2 (two) times daily for 90 days 1200 mL 2  metoprolol succinate (TOPROL-XL) 200 MG XL tablet Take 200 mg by mouth once daily  mometasone (NASONEX) 50 mcg/actuation nasal spray Place 2 sprays into both nostrils once daily  montelukast (SINGULAIR) 10 mg tablet Take 10 mg by mouth once daily  multivitamin tablet Take 1 tablet by mouth once daily  NON FORMULARY Inject 1 Dose subcutaneously once a week Allergy shots  oxymetazoline (AFRIN) 0.05 % nasal spray Place 2 sprays into both nostrils at bedtime  pantoprazole (PROTONIX) 40 MG DR tablet Take 40 mg by mouth once daily  rosuvastatin (CRESTOR) 20 MG tablet Take 20 mg by mouth once daily  semaglutide (OZEMPIC) 1 mg/dose (4 mg/3 mL) pen injector Inject 0.75 mLs (1 mg total) subcutaneously every 7 (seven) days 9 mL 4  spironolactone (ALDACTONE) 25 MG tablet Take 25 mg by mouth once daily  traMADoL (ULTRAM) 50 mg tablet Take 1 tablet (50 mg total) by mouth every 6 (six) hours as needed for Pain for up to 30 doses 30 tablet 0   valACYclovir (VALTREX) 500 MG tablet Take 500 mg by mouth 2 (two) times daily  valsartan-hydrochlorothiazide (DIOVAN-HCT) 160-25 mg tablet Take 1 tablet by mouth once daily   No current facility-administered medications for this visit.   Allergies:  Allergies  Allergen Reactions  Adhesive Rash  Most tapes cause redness. Paper tape and tegaderm are OK.   Social History:  Social History   Socioeconomic History  Marital status: Single  Number of children: 1  Years of education: 18  Highest education level: Master's degree (e.g., MA, MS, MEng, MEd, MSW, MBA)  Occupational History  Occupation: Animator- Social Service  Tobacco Use  Smoking status: Never  Smokeless tobacco: Never  Vaping Use  Vaping Use: Never used  Substance and Sexual Activity  Alcohol use: Not Currently  Drug use: No  Sexual activity: Defer  Partners: Male   Family History:  Family History  Problem Relation Age of Onset  Diabetes type II Mother  High blood pressure (Hypertension) Father  Heart block Father  Hyperlipidemia (Elevated cholesterol) Father  Diabetes type II Brother  Heart valve disease Brother   Review of Systems:   A 10+ ROS was performed, reviewed, and the pertinent orthopaedic findings are documented in the HPI.   Physical Examination:   BP 112/60  Ht 168.9 cm (5' 6.5")  Wt 75.2 kg (165 lb 12.8 oz)  LMP (LMP Unknown)  BMI 26.36 kg/m   Skin over the left knee is clean and dry. There is no erythema, drainage, or other sign of infection.   The patient is able to actively flex and extend the left knee.  The patient is able to plantarflex and dorsiflex the ankle.   The patient is able to flex and extend the hallux of the foot.   Neurovascularly intact.  Tests Performed/Reviewed:  X-rays  3 views of the left knee were obtained. Images reveal moderate loss of medial compartment joint space with osteophyte formation. No fractures or dislocations. No other osseous abnormality  noted.  I personally reviewed and visualized the imaging studies if available. I additionally personally interpreted any radiographs taken during today's visit.  Impression:   ICD-10-CM  1. Chronic pain of left knee M25.562  G89.29  2. Primary osteoarthritis of left knee M17.12   Plan:   -primary osteoarthritis of the left knee(s) The patient would benefit from a steroid injection.   After discussion of the risks and benefits of steroid injection, the patient expressed understanding of the risks and benefits and agreed to proceed.  The patient`s left knee was cleaned and prepped with ChloroPrep. The skin was anesthetized with ethyl chloride, and a combination of 2 ml of 1% Lidocaine, 2 ml of 0.25% Marcaine, and 1 ml of Kenalog was injected into the left knee joint via inferolateral approach. A Bandaid was then applied to the injection site. The patient tolerated the injection well.   Patient is to follow-up as needed.   Contact our office with any questions or concerns. Follow up as indicated, or sooner should any new problems  arise, if conditions worsen, or if they are otherwise concerned.   Cindy Fudge, PA-C [facility] [place], [place] [address] Phone: [phone]  This note was generated in part with voice recognition software and I apologize for any typographical errors that were not detected and corrected.  Electronically signed by Cindy Fudge, PA at 09/21/2021 5:18 PM EDT

## 2021-09-25 ENCOUNTER — Other Ambulatory Visit: Payer: Self-pay | Admitting: Family Medicine

## 2021-09-25 NOTE — Telephone Encounter (Signed)
Pt called and wanted to inquire about a refill for the following:   Requested Prescriptions   Pending Prescriptions Disp Refills   metoprolol (TOPROL-XL) 200 MG 24 hr tablet 90 tablet 4   Sent to :[id number] - [address], [address]  Phone:  [phone]  Fax:  [phone]

## 2021-09-26 NOTE — Telephone Encounter (Signed)
Duplicate request- filled by provider today Requested Prescriptions  Pending Prescriptions Disp Refills  . metoprolol (TOPROL-XL) 200 MG 24 hr tablet 90 tablet 4     Cardiovascular:  Beta Blockers Failed - 09/25/2021  9:55 AM      Failed - Last BP in normal range    BP Readings from Last 1 Encounters:  09/20/21 (!) 98/47         Failed - Valid encounter within last 6 months    Recent Outpatient Visits          6 months ago Strain of neck muscle, initial encounter   [facility] Thedore Mins, [place], PA-C   12 months ago Annual physical exam   [facility] Jerrol Banana., MD   1 year ago Chronic maxillary sinusitis   [facility] Trinna Post, [place]   1 year ago Annual physical exam   [facility] Jerrol Banana., MD   2 years ago Chronic venous insufficiency   [facility] Jerrol Banana., MD      Future Appointments            In 1 week Jerrol Banana., MD [facility], [place]   In 1 month Adrian Prows, MD [facility]           Passed - Last Heart Rate in normal range    Pulse Readings from Last 1 Encounters:  09/20/21 73

## 2021-09-27 ENCOUNTER — Inpatient Hospital Stay
Admission: AD | Admit: 2021-09-27 | Discharge: 2021-09-30 | DRG: 470 | Disposition: A | Discharge status: Home / Self Care | Payer: BC Managed Care – PPO | Attending: Orthopedic Surgery | Admitting: Orthopedic Surgery

## 2021-09-27 ENCOUNTER — Encounter
Admission: AD | Disposition: A | Discharge status: Home / Self Care | Payer: Self-pay | Source: Home / Self Care | Attending: Orthopedic Surgery

## 2021-09-27 ENCOUNTER — Encounter: Payer: Self-pay | Admitting: Orthopedic Surgery

## 2021-09-27 ENCOUNTER — Other Ambulatory Visit: Payer: Self-pay

## 2021-09-27 ENCOUNTER — Observation Stay: Payer: BC Managed Care – PPO

## 2021-09-27 ENCOUNTER — Ambulatory Visit: Payer: BC Managed Care – PPO

## 2021-09-27 ENCOUNTER — Ambulatory Visit: Payer: BC Managed Care – PPO | Admitting: Urgent Care

## 2021-09-27 DIAGNOSIS — Z7982 Long term (current) use of aspirin: Secondary | ICD-10-CM

## 2021-09-27 DIAGNOSIS — E669 Obesity, unspecified: Secondary | ICD-10-CM | POA: Diagnosis present

## 2021-09-27 DIAGNOSIS — Z8249 Family history of ischemic heart disease and other diseases of the circulatory system: Secondary | ICD-10-CM | POA: Diagnosis not present

## 2021-09-27 DIAGNOSIS — Z6826 Body mass index (BMI) 26.0-26.9, adult: Secondary | ICD-10-CM | POA: Diagnosis not present

## 2021-09-27 DIAGNOSIS — G8929 Other chronic pain: Secondary | ICD-10-CM | POA: Diagnosis present

## 2021-09-27 DIAGNOSIS — G4733 Obstructive sleep apnea (adult) (pediatric): Secondary | ICD-10-CM | POA: Diagnosis not present

## 2021-09-27 DIAGNOSIS — J301 Allergic rhinitis due to pollen: Secondary | ICD-10-CM | POA: Diagnosis present

## 2021-09-27 DIAGNOSIS — M25552 Pain in left hip: Secondary | ICD-10-CM | POA: Diagnosis present

## 2021-09-27 DIAGNOSIS — I872 Venous insufficiency (chronic) (peripheral): Secondary | ICD-10-CM | POA: Diagnosis present

## 2021-09-27 DIAGNOSIS — E785 Hyperlipidemia, unspecified: Secondary | ICD-10-CM

## 2021-09-27 DIAGNOSIS — J3081 Allergic rhinitis due to animal (cat) (dog) hair and dander: Secondary | ICD-10-CM | POA: Insufficient documentation

## 2021-09-27 DIAGNOSIS — Z91048 Other nonmedicinal substance allergy status: Secondary | ICD-10-CM

## 2021-09-27 DIAGNOSIS — E1142 Type 2 diabetes mellitus with diabetic polyneuropathy: Secondary | ICD-10-CM

## 2021-09-27 DIAGNOSIS — K219 Gastro-esophageal reflux disease without esophagitis: Secondary | ICD-10-CM | POA: Diagnosis not present

## 2021-09-27 DIAGNOSIS — N179 Acute kidney failure, unspecified: Secondary | ICD-10-CM

## 2021-09-27 DIAGNOSIS — Z9109 Other allergy status, other than to drugs and biological substances: Secondary | ICD-10-CM

## 2021-09-27 DIAGNOSIS — M1612 Unilateral primary osteoarthritis, left hip: Principal | ICD-10-CM | POA: Diagnosis present

## 2021-09-27 DIAGNOSIS — E7800 Pure hypercholesterolemia, unspecified: Secondary | ICD-10-CM | POA: Diagnosis not present

## 2021-09-27 DIAGNOSIS — Z79899 Other long term (current) drug therapy: Secondary | ICD-10-CM

## 2021-09-27 DIAGNOSIS — Z471 Aftercare following joint replacement surgery: Secondary | ICD-10-CM | POA: Diagnosis not present

## 2021-09-27 DIAGNOSIS — J329 Chronic sinusitis, unspecified: Secondary | ICD-10-CM | POA: Diagnosis not present

## 2021-09-27 DIAGNOSIS — I959 Hypotension, unspecified: Secondary | ICD-10-CM | POA: Diagnosis not present

## 2021-09-27 DIAGNOSIS — Z833 Family history of diabetes mellitus: Secondary | ICD-10-CM

## 2021-09-27 DIAGNOSIS — N1832 Chronic kidney disease, stage 3b: Secondary | ICD-10-CM | POA: Diagnosis present

## 2021-09-27 DIAGNOSIS — E039 Hypothyroidism, unspecified: Secondary | ICD-10-CM

## 2021-09-27 DIAGNOSIS — E559 Vitamin D deficiency, unspecified: Secondary | ICD-10-CM | POA: Diagnosis present

## 2021-09-27 DIAGNOSIS — M1712 Unilateral primary osteoarthritis, left knee: Secondary | ICD-10-CM | POA: Diagnosis present

## 2021-09-27 DIAGNOSIS — I129 Hypertensive chronic kidney disease with stage 1 through stage 4 chronic kidney disease, or unspecified chronic kidney disease: Secondary | ICD-10-CM | POA: Diagnosis present

## 2021-09-27 DIAGNOSIS — Z96642 Presence of left artificial hip joint: Principal | ICD-10-CM

## 2021-09-27 DIAGNOSIS — Z794 Long term (current) use of insulin: Secondary | ICD-10-CM

## 2021-09-27 DIAGNOSIS — E1122 Type 2 diabetes mellitus with diabetic chronic kidney disease: Secondary | ICD-10-CM | POA: Diagnosis not present

## 2021-09-27 DIAGNOSIS — R600 Localized edema: Secondary | ICD-10-CM

## 2021-09-27 HISTORY — PX: TOTAL HIP ARTHROPLASTY: SHX124

## 2021-09-27 LAB — GLUCOSE, CAPILLARY
Glucose-Capillary: 141 mg/dL — ABNORMAL HIGH (ref 70–99)
Glucose-Capillary: 157 mg/dL — ABNORMAL HIGH (ref 70–99)
Glucose-Capillary: 209 mg/dL — ABNORMAL HIGH (ref 70–99)
Glucose-Capillary: 192 mg/dL — ABNORMAL HIGH (ref 70–99)

## 2021-09-27 LAB — ABO/RH: ABO/RH(D): A POS

## 2021-09-27 SURGERY — ARTHROPLASTY, HIP, TOTAL,POSTERIOR APPROACH
Anesthesia: Spinal | Site: Hip | Laterality: Left

## 2021-09-27 MED ORDER — PHENOL 1.4 % MT LIQD: 1.0000 | OROMUCOSAL | Status: DC | PRN

## 2021-09-27 MED ORDER — GABAPENTIN 300 MG PO CAPS
ORAL_CAPSULE | ORAL | Status: AC
  Administered 2021-09-27: 300 mg via ORAL
  Filled 2021-09-27: qty 1

## 2021-09-27 MED ORDER — INSULIN ASPART 100 UNIT/ML IJ SOLN
0.0000 [IU] | Freq: Three times a day (TID) | INTRAMUSCULAR | Status: DC
  Administered 2021-09-27: 5 [IU] via SUBCUTANEOUS
  Administered 2021-09-28 (×2): 3 [IU] via SUBCUTANEOUS
  Administered 2021-09-28: 2 [IU] via SUBCUTANEOUS
  Administered 2021-09-29: 3 [IU] via SUBCUTANEOUS
  Administered 2021-09-29: 2 [IU] via SUBCUTANEOUS
  Administered 2021-09-30: 3 [IU] via SUBCUTANEOUS
  Administered 2021-09-30: 2 [IU] via SUBCUTANEOUS
  Filled 2021-09-27 (×7): qty 1

## 2021-09-27 MED ORDER — EPHEDRINE SULFATE (PRESSORS) 50 MG/ML IJ SOLN
INTRAMUSCULAR | Status: DC | PRN
  Administered 2021-09-27: 10 mg via INTRAVENOUS

## 2021-09-27 MED ORDER — CHLORHEXIDINE GLUCONATE 0.12 % MT SOLN: 15.0000 mL | Freq: Once | OROMUCOSAL | Status: AC

## 2021-09-27 MED ORDER — MIDAZOLAM HCL 2 MG/2ML IJ SOLN
INTRAMUSCULAR | Status: AC
  Filled 2021-09-27: qty 2

## 2021-09-27 MED ORDER — MAGNESIUM HYDROXIDE 400 MG/5ML PO SUSP
30.0000 mL | Freq: Every day | ORAL | Status: DC
  Administered 2021-09-27 – 2021-09-30 (×4): 30 mL via ORAL
  Filled 2021-09-27 (×4): qty 30

## 2021-09-27 MED ORDER — TRANEXAMIC ACID-NACL 1000-0.7 MG/100ML-% IV SOLN
1000.0000 mg | Freq: Once | INTRAVENOUS | Status: AC
Start: 2021-09-27 — End: 2021-09-27
  Administered 2021-09-27: 1000 mg via INTRAVENOUS

## 2021-09-27 MED ORDER — FUROSEMIDE 20 MG PO TABS: 20.0000 mg | ORAL_TABLET | Freq: Every day | ORAL | Status: DC | PRN

## 2021-09-27 MED ORDER — MIDAZOLAM HCL 5 MG/5ML IJ SOLN
INTRAMUSCULAR | Status: DC | PRN
  Administered 2021-09-27: 2 mg via INTRAVENOUS

## 2021-09-27 MED ORDER — LIDOCAINE HCL (CARDIAC) PF 100 MG/5ML IV SOSY
PREFILLED_SYRINGE | INTRAVENOUS | Status: DC | PRN
  Administered 2021-09-27: 40 mg via INTRAVENOUS

## 2021-09-27 MED ORDER — TRANEXAMIC ACID-NACL 1000-0.7 MG/100ML-% IV SOLN
INTRAVENOUS | Status: AC
  Filled 2021-09-27: qty 100

## 2021-09-27 MED ORDER — DIPHENHYDRAMINE HCL 50 MG/ML IJ SOLN
INTRAMUSCULAR | Status: DC | PRN
  Administered 2021-09-27 (×2): 12.5 mg via INTRAVENOUS

## 2021-09-27 MED ORDER — METOPROLOL SUCCINATE ER 50 MG PO TB24
200.0000 mg | ORAL_TABLET | Freq: Every day | ORAL | Status: DC
  Administered 2021-09-29: 200 mg via ORAL
  Filled 2021-09-27 (×2): qty 4

## 2021-09-27 MED ORDER — CEFAZOLIN SODIUM-DEXTROSE 2-4 GM/100ML-% IV SOLN
2.0000 g | INTRAVENOUS | Status: AC
  Administered 2021-09-27: 2 g via INTRAVENOUS

## 2021-09-27 MED ORDER — VITAMIN D 25 MCG (1000 UNIT) PO TABS
5000.0000 [IU] | ORAL_TABLET | ORAL | Status: DC
  Administered 2021-09-28: 5000 [IU] via ORAL
  Filled 2021-09-27: qty 5

## 2021-09-27 MED ORDER — HYDROMORPHONE HCL 1 MG/ML IJ SOLN
0.5000 mg | INTRAMUSCULAR | Status: DC | PRN
  Administered 2021-09-27 – 2021-09-28 (×3): 1 mg via INTRAVENOUS
  Filled 2021-09-27 (×3): qty 1

## 2021-09-27 MED ORDER — OXYCODONE HCL 5 MG PO TABS
5.0000 mg | ORAL_TABLET | ORAL | Status: DC | PRN
  Administered 2021-09-28 – 2021-09-30 (×3): 5 mg via ORAL
  Filled 2021-09-27 (×3): qty 1

## 2021-09-27 MED ORDER — GABAPENTIN 300 MG PO CAPS: 300.0000 mg | ORAL_CAPSULE | Freq: Once | ORAL | Status: AC

## 2021-09-27 MED ORDER — OXYMETAZOLINE HCL 0.05 % NA SOLN
2.0000 | Freq: Every day | NASAL | Status: AC
  Administered 2021-09-27 – 2021-09-29 (×3): 2 via NASAL
  Filled 2021-09-27 (×2): qty 15

## 2021-09-27 MED ORDER — ORAL CARE MOUTH RINSE
15.0000 mL | Freq: Once | OROMUCOSAL | Status: AC
Start: 2021-09-27 — End: 2021-09-27

## 2021-09-27 MED ORDER — ACETAMINOPHEN 10 MG/ML IV SOLN
1000.0000 mg | Freq: Four times a day (QID) | INTRAVENOUS | Status: AC
  Administered 2021-09-27 – 2021-09-28 (×4): 1000 mg via INTRAVENOUS
  Filled 2021-09-27 (×4): qty 100

## 2021-09-27 MED ORDER — IRBESARTAN 150 MG PO TABS
150.0000 mg | ORAL_TABLET | Freq: Every day | ORAL | Status: DC
  Administered 2021-09-29: 150 mg via ORAL
  Filled 2021-09-27: qty 1

## 2021-09-27 MED ORDER — PROPOFOL 10 MG/ML IV BOLUS
INTRAVENOUS | Status: DC | PRN
  Administered 2021-09-27 (×6): 20 mg via INTRAVENOUS

## 2021-09-27 MED ORDER — BUPIVACAINE HCL (PF) 0.5 % IJ SOLN
INTRAMUSCULAR | Status: DC | PRN
  Administered 2021-09-27: 3 mL

## 2021-09-27 MED ORDER — KETOTIFEN FUMARATE 0.025 % OP SOLN: 1.0000 [drp] | Freq: Two times a day (BID) | OPHTHALMIC | Status: DC | PRN

## 2021-09-27 MED ORDER — 0.9 % SODIUM CHLORIDE (POUR BTL) OPTIME
TOPICAL | Status: DC | PRN
  Administered 2021-09-27: 1000 mL

## 2021-09-27 MED ORDER — EPHEDRINE 5 MG/ML INJ
INTRAVENOUS | Status: AC
  Filled 2021-09-27: qty 5

## 2021-09-27 MED ORDER — BUPIVACAINE HCL (PF) 0.5 % IJ SOLN
INTRAMUSCULAR | Status: AC
Start: 2021-09-27 — End: ?
  Filled 2021-09-27: qty 10

## 2021-09-27 MED ORDER — MENTHOL 3 MG MT LOZG: 1.0000 | LOZENGE | OROMUCOSAL | Status: DC | PRN

## 2021-09-27 MED ORDER — ONDANSETRON HCL 4 MG PO TABS: 4.0000 mg | ORAL_TABLET | Freq: Four times a day (QID) | ORAL | Status: DC | PRN

## 2021-09-27 MED ORDER — SURGIPHOR WOUND IRRIGATION SYSTEM - OPTIME
TOPICAL | Status: DC | PRN
  Administered 2021-09-27: 450 mL via TOPICAL

## 2021-09-27 MED ORDER — ALUM & MAG HYDROXIDE-SIMETH 200-200-20 MG/5ML PO SUSP: 30.0000 mL | ORAL | Status: DC | PRN

## 2021-09-27 MED ORDER — PROPOFOL 1000 MG/100ML IV EMUL
INTRAVENOUS | Status: AC
  Filled 2021-09-27: qty 100

## 2021-09-27 MED ORDER — CEFAZOLIN SODIUM-DEXTROSE 2-4 GM/100ML-% IV SOLN
INTRAVENOUS | Status: AC
  Filled 2021-09-27: qty 100

## 2021-09-27 MED ORDER — DIPHENHYDRAMINE HCL 12.5 MG/5ML PO ELIX: 12.5000 mg | ORAL_SOLUTION | ORAL | Status: DC | PRN

## 2021-09-27 MED ORDER — CELECOXIB 200 MG PO CAPS: 400.0000 mg | ORAL_CAPSULE | Freq: Once | ORAL | Status: AC

## 2021-09-27 MED ORDER — EZETIMIBE 10 MG PO TABS
10.0000 mg | ORAL_TABLET | Freq: Every day | ORAL | Status: DC
  Administered 2021-09-27 – 2021-09-29 (×2): 10 mg via ORAL
  Filled 2021-09-27 (×4): qty 1

## 2021-09-27 MED ORDER — ADULT MULTIVITAMIN W/MINERALS CH
1.0000 | ORAL_TABLET | Freq: Every day | ORAL | Status: DC
  Administered 2021-09-27 – 2021-09-30 (×4): 1 via ORAL
  Filled 2021-09-27 (×4): qty 1

## 2021-09-27 MED ORDER — ONDANSETRON HCL 4 MG/2ML IJ SOLN
INTRAMUSCULAR | Status: AC
Start: 2021-09-27 — End: ?
  Filled 2021-09-27: qty 2

## 2021-09-27 MED ORDER — VALACYCLOVIR HCL 500 MG PO TABS: 500.0000 mg | ORAL_TABLET | Freq: Two times a day (BID) | ORAL | Status: DC | PRN

## 2021-09-27 MED ORDER — BISACODYL 10 MG RE SUPP: 10.0000 mg | Freq: Every day | RECTAL | Status: DC | PRN

## 2021-09-27 MED ORDER — PROPOFOL 500 MG/50ML IV EMUL
INTRAVENOUS | Status: DC | PRN
  Administered 2021-09-27: 80 ug/kg/min via INTRAVENOUS
  Administered 2021-09-27: 75 ug/kg/min via INTRAVENOUS

## 2021-09-27 MED ORDER — FENTANYL CITRATE (PF) 100 MCG/2ML IJ SOLN
INTRAMUSCULAR | Status: DC | PRN
  Administered 2021-09-27 (×2): 50 ug via INTRAVENOUS

## 2021-09-27 MED ORDER — CEFAZOLIN SODIUM-DEXTROSE 2-4 GM/100ML-% IV SOLN
2.0000 g | Freq: Four times a day (QID) | INTRAVENOUS | Status: AC
  Administered 2021-09-27 (×2): 2 g via INTRAVENOUS
  Filled 2021-09-27 (×2): qty 100

## 2021-09-27 MED ORDER — LACTULOSE 10 GM/15ML PO SOLN
13.3000 g | Freq: Two times a day (BID) | ORAL | Status: DC
  Administered 2021-09-27 – 2021-09-30 (×6): 13.3 g via ORAL
  Filled 2021-09-27 (×7): qty 30

## 2021-09-27 MED ORDER — ONDANSETRON HCL 4 MG/2ML IJ SOLN: 4.0000 mg | Freq: Once | INTRAMUSCULAR | Status: DC | PRN

## 2021-09-27 MED ORDER — ACETAMINOPHEN 325 MG PO TABS
325.0000 mg | ORAL_TABLET | Freq: Four times a day (QID) | ORAL | Status: DC | PRN
  Administered 2021-09-29 – 2021-09-30 (×2): 650 mg via ORAL
  Filled 2021-09-27 (×2): qty 2

## 2021-09-27 MED ORDER — SODIUM CHLORIDE 0.9 % IV SOLN: INTRAVENOUS | Status: DC

## 2021-09-27 MED ORDER — CELECOXIB 200 MG PO CAPS
200.0000 mg | ORAL_CAPSULE | Freq: Two times a day (BID) | ORAL | Status: DC
  Administered 2021-09-27 – 2021-09-30 (×7): 200 mg via ORAL
  Filled 2021-09-27 (×7): qty 1

## 2021-09-27 MED ORDER — VALSARTAN-HYDROCHLOROTHIAZIDE 160-25 MG PO TABS: 1.0000 | ORAL_TABLET | Freq: Every morning | ORAL | Status: DC

## 2021-09-27 MED ORDER — ONDANSETRON HCL 4 MG/2ML IJ SOLN: 4.0000 mg | Freq: Four times a day (QID) | INTRAMUSCULAR | Status: DC | PRN

## 2021-09-27 MED ORDER — PANTOPRAZOLE SODIUM 40 MG PO TBEC
40.0000 mg | DELAYED_RELEASE_TABLET | Freq: Two times a day (BID) | ORAL | Status: DC
  Administered 2021-09-27 – 2021-09-30 (×7): 40 mg via ORAL
  Filled 2021-09-27 (×7): qty 1

## 2021-09-27 MED ORDER — SENNOSIDES-DOCUSATE SODIUM 8.6-50 MG PO TABS
1.0000 | ORAL_TABLET | Freq: Two times a day (BID) | ORAL | Status: DC
  Administered 2021-09-27 – 2021-09-30 (×6): 1 via ORAL
  Filled 2021-09-27 (×7): qty 1

## 2021-09-27 MED ORDER — CHLORHEXIDINE GLUCONATE 4 % EX LIQD
60.0000 mL | Freq: Once | CUTANEOUS | Status: AC
  Administered 2021-09-27: 4 via TOPICAL

## 2021-09-27 MED ORDER — ENOXAPARIN SODIUM 30 MG/0.3ML IJ SOSY
30.0000 mg | PREFILLED_SYRINGE | Freq: Two times a day (BID) | INTRAMUSCULAR | Status: DC
  Administered 2021-09-28 – 2021-09-30 (×5): 30 mg via SUBCUTANEOUS
  Filled 2021-09-27 (×5): qty 0.3

## 2021-09-27 MED ORDER — INSULIN ASPART 100 UNIT/ML IJ SOLN: 0.0000 [IU] | Freq: Every day | INTRAMUSCULAR | Status: DC

## 2021-09-27 MED ORDER — AZELASTINE HCL 0.1 % NA SOLN: 1.0000 | Freq: Two times a day (BID) | NASAL | Status: DC | PRN

## 2021-09-27 MED ORDER — DEXAMETHASONE SODIUM PHOSPHATE 10 MG/ML IJ SOLN
INTRAMUSCULAR | Status: AC
  Filled 2021-09-27: qty 1

## 2021-09-27 MED ORDER — ROSUVASTATIN CALCIUM 10 MG PO TABS
20.0000 mg | ORAL_TABLET | Freq: Every day | ORAL | Status: DC
  Administered 2021-09-27 – 2021-09-30 (×2): 20 mg via ORAL
  Filled 2021-09-27 (×4): qty 2

## 2021-09-27 MED ORDER — FENTANYL CITRATE (PF) 100 MCG/2ML IJ SOLN
INTRAMUSCULAR | Status: AC
  Filled 2021-09-27: qty 2

## 2021-09-27 MED ORDER — HYDROCHLOROTHIAZIDE 25 MG PO TABS
25.0000 mg | ORAL_TABLET | Freq: Every day | ORAL | Status: DC
  Administered 2021-09-29: 25 mg via ORAL
  Filled 2021-09-27: qty 1

## 2021-09-27 MED ORDER — FLUTICASONE PROPIONATE 50 MCG/ACT NA SUSP: 2.0000 | Freq: Every day | NASAL | Status: DC | PRN

## 2021-09-27 MED ORDER — SODIUM CHLORIDE 0.9 % IR SOLN
Status: DC | PRN
  Administered 2021-09-27: 3000 mL

## 2021-09-27 MED ORDER — METOCLOPRAMIDE HCL 5 MG PO TABS
10.0000 mg | ORAL_TABLET | Freq: Three times a day (TID) | ORAL | Status: AC
  Administered 2021-09-27 – 2021-09-29 (×8): 10 mg via ORAL
  Filled 2021-09-27 (×8): qty 2

## 2021-09-27 MED ORDER — PHENYLEPHRINE HCL-NACL 20-0.9 MG/250ML-% IV SOLN
INTRAVENOUS | Status: DC | PRN
  Administered 2021-09-27: 40 ug/min via INTRAVENOUS

## 2021-09-27 MED ORDER — MONTELUKAST SODIUM 10 MG PO TABS
10.0000 mg | ORAL_TABLET | Freq: Every day | ORAL | Status: DC
  Administered 2021-09-27 – 2021-09-29 (×3): 10 mg via ORAL
  Filled 2021-09-27 (×3): qty 1

## 2021-09-27 MED ORDER — ACETAMINOPHEN 10 MG/ML IV SOLN
INTRAVENOUS | Status: DC | PRN
  Administered 2021-09-27: 1000 mg via INTRAVENOUS

## 2021-09-27 MED ORDER — FERROUS SULFATE 325 (65 FE) MG PO TABS
325.0000 mg | ORAL_TABLET | Freq: Two times a day (BID) | ORAL | Status: DC
  Administered 2021-09-27 – 2021-09-30 (×6): 325 mg via ORAL
  Filled 2021-09-27 (×6): qty 1

## 2021-09-27 MED ORDER — SPIRONOLACTONE 25 MG PO TABS
25.0000 mg | ORAL_TABLET | Freq: Every day | ORAL | Status: DC
  Administered 2021-09-27 – 2021-09-30 (×4): 25 mg via ORAL
  Filled 2021-09-27 (×4): qty 1

## 2021-09-27 MED ORDER — VITAMIN B-12 1000 MCG PO TABS
2500.0000 ug | ORAL_TABLET | ORAL | Status: DC
  Administered 2021-09-28: 2500 ug via ORAL
  Filled 2021-09-27: qty 3

## 2021-09-27 MED ORDER — AMLODIPINE BESYLATE 10 MG PO TABS
10.0000 mg | ORAL_TABLET | Freq: Every day | ORAL | Status: DC
  Administered 2021-09-29: 10 mg via ORAL
  Filled 2021-09-27: qty 1

## 2021-09-27 MED ORDER — FLEET ENEMA 7-19 GM/118ML RE ENEM: 1.0000 | ENEMA | Freq: Once | RECTAL | Status: DC | PRN

## 2021-09-27 MED ORDER — PROPOFOL 10 MG/ML IV BOLUS
INTRAVENOUS | Status: AC
  Filled 2021-09-27: qty 20

## 2021-09-27 MED ORDER — PHENYLEPHRINE HCL-NACL 20-0.9 MG/250ML-% IV SOLN
INTRAVENOUS | Status: AC
  Filled 2021-09-27: qty 250

## 2021-09-27 MED ORDER — DEXAMETHASONE SODIUM PHOSPHATE 10 MG/ML IJ SOLN
INTRAMUSCULAR | Status: AC
  Administered 2021-09-27: 8 mg via INTRAVENOUS
  Filled 2021-09-27: qty 1

## 2021-09-27 MED ORDER — DEXMEDETOMIDINE (PRECEDEX) IN NS 20 MCG/5ML (4 MCG/ML) IV SYRINGE
PREFILLED_SYRINGE | INTRAVENOUS | Status: DC | PRN
  Administered 2021-09-27 (×5): 4 ug via INTRAVENOUS

## 2021-09-27 MED ORDER — TRAMADOL HCL 50 MG PO TABS
50.0000 mg | ORAL_TABLET | ORAL | Status: DC | PRN
  Administered 2021-09-28 – 2021-09-30 (×2): 100 mg via ORAL
  Filled 2021-09-27: qty 2
  Filled 2021-09-27: qty 1
  Filled 2021-09-27: qty 2

## 2021-09-27 MED ORDER — CHLORHEXIDINE GLUCONATE 0.12 % MT SOLN
OROMUCOSAL | Status: AC
  Administered 2021-09-27: 15 mL via OROMUCOSAL
  Filled 2021-09-27: qty 15

## 2021-09-27 MED ORDER — FENTANYL CITRATE (PF) 100 MCG/2ML IJ SOLN: 25.0000 ug | INTRAMUSCULAR | Status: DC | PRN

## 2021-09-27 MED ORDER — ACETAMINOPHEN 10 MG/ML IV SOLN
INTRAVENOUS | Status: AC
  Filled 2021-09-27: qty 100

## 2021-09-27 MED ORDER — TRANEXAMIC ACID-NACL 1000-0.7 MG/100ML-% IV SOLN
1000.0000 mg | INTRAVENOUS | Status: AC
  Administered 2021-09-27: 1000 mg via INTRAVENOUS

## 2021-09-27 MED ORDER — CELECOXIB 200 MG PO CAPS
ORAL_CAPSULE | ORAL | Status: AC
  Administered 2021-09-27: 400 mg via ORAL
  Filled 2021-09-27: qty 2

## 2021-09-27 MED ORDER — DEXAMETHASONE SODIUM PHOSPHATE 10 MG/ML IJ SOLN
8.0000 mg | Freq: Once | INTRAMUSCULAR | Status: AC
Start: 2021-09-27 — End: 2021-09-27

## 2021-09-27 MED ORDER — SEVOFLURANE IN SOLN
RESPIRATORY_TRACT | Status: AC
Start: 2021-09-27 — End: ?
  Filled 2021-09-27: qty 250

## 2021-09-27 MED ORDER — OXYCODONE HCL 5 MG PO TABS
10.0000 mg | ORAL_TABLET | ORAL | Status: DC | PRN
  Administered 2021-09-27 – 2021-09-29 (×2): 10 mg via ORAL
  Filled 2021-09-27 (×3): qty 2

## 2021-09-27 SURGICAL SUPPLY — 67 items
BLADE DRUM FLTD (BLADE) ×1 IMPLANT
BLADE SAW 90X25X1.19 OSCILLAT (BLADE) ×1 IMPLANT
CARTRIDGE OIL MAESTRO DRILL (MISCELLANEOUS) ×1 IMPLANT
DIFFUSER DRILL AIR PNEUMATIC (MISCELLANEOUS) ×1 IMPLANT
DRAPE 3/4 80X56 (DRAPES) ×1 IMPLANT
DRAPE INCISE IOBAN 66X60 STRL (DRAPES) ×1 IMPLANT
DRSG DERMACEA NONADH 3X8 (GAUZE/BANDAGES/DRESSINGS) ×1 IMPLANT
DRSG MEPILEX SACRM 8.7X9.8 (GAUZE/BANDAGES/DRESSINGS) ×1 IMPLANT
DRSG OPSITE POSTOP 4X12 (GAUZE/BANDAGES/DRESSINGS) ×1 IMPLANT
DRSG OPSITE POSTOP 4X14 (GAUZE/BANDAGES/DRESSINGS) IMPLANT
DRSG TEGADERM 4X4.75 (GAUZE/BANDAGES/DRESSINGS) ×1 IMPLANT
DURAPREP 26ML APPLICATOR (WOUND CARE) ×2 IMPLANT
ELECT CAUTERY BLADE 6.4 (BLADE) ×1 IMPLANT
ELECT REM PT RETURN 9FT ADLT (ELECTROSURGICAL) ×1
ELECTRODE REM PT RTRN 9FT ADLT (ELECTROSURGICAL) ×1 IMPLANT
GLOVE BIO SURGEON STRL SZ7.5 (GLOVE) ×2 IMPLANT
GLOVE BIOGEL M STRL SZ7.5 (GLOVE) ×2 IMPLANT
GLOVE BIOGEL PI ORTHO PRO 7.5 (GLOVE) ×2
GLOVE PI ORTHO PRO STRL 7.5 (GLOVE) ×2 IMPLANT
GLOVE SURG UNDER LTX SZ8 (GLOVE) ×1 IMPLANT
GLOVE SURG UNDER POLY LF SZ7.5 (GLOVE) ×1 IMPLANT
GOWN STRL REUS W/ TWL LRG LVL3 (GOWN DISPOSABLE) ×2 IMPLANT
GOWN STRL REUS W/ TWL XL LVL3 (GOWN DISPOSABLE) ×1 IMPLANT
GOWN STRL REUS W/TWL LRG LVL3 (GOWN DISPOSABLE) ×2
GOWN STRL REUS W/TWL XL LVL3 (GOWN DISPOSABLE) ×1
HEAD FEM STD 32X+1 STRL (Hips) IMPLANT
HEMOVAC 400CC 10FR (MISCELLANEOUS) ×1 IMPLANT
HOLDER FOLEY CATH W/STRAP (MISCELLANEOUS) ×1 IMPLANT
HOLSTER ELECTROSUGICAL PENCIL (MISCELLANEOUS) ×2 IMPLANT
HOOD PEEL AWAY FLYTE STAYCOOL (MISCELLANEOUS) ×2 IMPLANT
IV NS IRRIG 3000ML ARTHROMATIC (IV SOLUTION) ×1 IMPLANT
KIT PEG BOARD PINK (KITS) ×1 IMPLANT
KIT TURNOVER KIT A (KITS) ×1 IMPLANT
LINER MARATHON 10 DEG 32MMX450 (Hips) ×1 IMPLANT
LINER MARATHON 10D 32MMX450 (Hips) IMPLANT
MANIFOLD NEPTUNE II (INSTRUMENTS) ×2 IMPLANT
NDL SAFETY ECLIPSE 18X1.5 (NEEDLE) ×1 IMPLANT
NEEDLE HYPO 18GX1.5 SHARP (NEEDLE) ×1
NS IRRIG 1000ML POUR BTL (IV SOLUTION) ×1 IMPLANT
NS IRRIG 500ML POUR BTL (IV SOLUTION) ×1 IMPLANT
OIL CARTRIDGE MAESTRO DRILL (MISCELLANEOUS) ×1
PACK HIP PROSTHESIS (MISCELLANEOUS) ×1 IMPLANT
PENCIL SMOKE EVACUATOR COATED (MISCELLANEOUS) ×1 IMPLANT
PIN SECT CUP 50MM (Hips) IMPLANT
PIN STEIN THRED 5/32 (Pin) ×1 IMPLANT
PULSAVAC PLUS IRRIG FAN TIP (DISPOSABLE) ×1
SOL PREP PVP 2OZ (MISCELLANEOUS) ×1
SOLUTION IRRIG SURGIPHOR (IV SOLUTION) ×1 IMPLANT
SOLUTION PREP PVP 2OZ (MISCELLANEOUS) ×1 IMPLANT
SPONGE DRAIN TRACH 4X4 STRL 2S (GAUZE/BANDAGES/DRESSINGS) ×1 IMPLANT
STAPLER SKIN PROX 35W (STAPLE) ×1 IMPLANT
STEM AML 12X155X30 STD SM 6IN (Joint) IMPLANT
SUT ETHIBOND #5 BRAIDED 30INL (SUTURE) ×1 IMPLANT
SUT VIC AB 0 CT1 36 (SUTURE) ×1 IMPLANT
SUT VIC AB 1 CT1 36 (SUTURE) ×2 IMPLANT
SUT VIC AB 2-0 CT1 27 (SUTURE) ×1
SUT VIC AB 2-0 CT1 TAPERPNT 27 (SUTURE) ×1 IMPLANT
SYR 20ML LL LF (SYRINGE) ×1 IMPLANT
TAPE CLOTH 3X10 WHT NS LF (GAUZE/BANDAGES/DRESSINGS) ×1 IMPLANT
TAPE TRANSPORE STRL 2 31045 (GAUZE/BANDAGES/DRESSINGS) ×1 IMPLANT
TIP FAN IRRIG PULSAVAC PLUS (DISPOSABLE) ×1 IMPLANT
TOWEL OR 17X26 4PK STRL BLUE (TOWEL DISPOSABLE) IMPLANT
TRAP FLUID SMOKE EVACUATOR (MISCELLANEOUS) ×1 IMPLANT
TRAY FOLEY MTR SLVR 16FR STAT (SET/KITS/TRAYS/PACK) ×1 IMPLANT
TUBE KAMVAC SUCTION (TUBING) ×1 IMPLANT
WATER STERILE IRR 1000ML POUR (IV SOLUTION) ×1 IMPLANT
WATER STERILE IRR 500ML POUR (IV SOLUTION) ×1 IMPLANT

## 2021-09-27 NOTE — Interval H&P Note (Signed)
History and Physical Interval Note:  09/27/2021 7:44 AM  Cindy Hester  has presented today for surgery, with the diagnosis of PRIMARY OSTEOARTHRITIS OF LEFT HIP.Marland Kitchen  The various methods of treatment have been discussed with the patient and family. After consideration of risks, benefits and other options for treatment, the patient has consented to  Procedure(s): TOTAL HIP ARTHROPLASTY (Left) as a surgical intervention.  [name] history has been reviewed, patient examined, no change in status, stable for surgery.  I have reviewed the patient's chart and labs.  Questions were answered to the patient's satisfaction.     [place]

## 2021-09-27 NOTE — Evaluation (Signed)
Physical Therapy Evaluation Patient Details Name: Cindy Hester MRN: [id number] DOB: Jun 13, 1945 Today's Date: 09/27/2021  History of Present Illness  Pt is s/p L THA with posterior approach on 09/27/21.  Clinical Impression  Pt received in Semi-Fowler's position and agreeable to therapy.  Pt notes she is in significant amount of pain and is requesting pain meds from nurse.  Nursing notified.  Pt is able to transition well out of bed and into standing position.  Pt notes significant amount of pain with any L hip abduction.  Pt ambulates with decreased stance time on the L LE an with step-to pattern.  Pt fatigues fairly quickly and requests to return to room and go to the bathroom.  Pt assisted in the bathroom and was able to complete peri-care with supervision.  Pt then transitioned back to the bed with all needs met.  Current discharge plans to SNF are most appropriate due to pt's daughter only being in town for 1 week, and then pt would be living alone after that.  If pt is able to make significant progress in the next few therapy session, will re-evaluate, but due to living situation and lack of caregiver support, pt would be best served by going to SNF.  Pt will continue to benefit from skilled therapy in order to address deficits listed below.         Recommendations for follow up therapy are one component of a multi-disciplinary discharge planning process, led by the attending physician.  Recommendations may be updated based on patient status, additional functional criteria and insurance authorization.  Follow Up Recommendations Skilled nursing-short term rehab (<3 hours/day) Can patient physically be transported by private vehicle: Yes    Assistance Recommended at Discharge PRN  Patient can return home with the following  A little help with walking and/or transfers;A little help with bathing/dressing/bathroom;Help with stairs or ramp for entrance    Equipment Recommendations None  recommended by PT  Recommendations for Other Services       Functional Status Assessment Patient has had a recent decline in their functional status and demonstrates the ability to make significant improvements in function in a reasonable and predictable amount of time.     Precautions / Restrictions        Mobility  Bed Mobility               General bed mobility comments: pt wiht good mobility.    Transfers Overall transfer level: Needs assistance Equipment used: Rolling walker (2 wheels) Transfers: Sit to/from Stand Sit to Stand: Min guard                Ambulation/Gait Ambulation/Gait assistance: Supervision Gait Distance (Feet): 80 Feet Assistive device: Rollator (4 wheels) Gait Pattern/deviations: Step-to pattern, Decreased step length - right, Decreased stance time - left, Decreased weight shift to left Gait velocity: decreased     General Gait Details: Decreased weight shift on the L LE and heavy use of the UE for support.  Stairs            Wheelchair Mobility    Modified Rankin (Stroke Patients Only)       Balance Overall balance assessment: Mild deficits observed, not formally tested                                           Pertinent Vitals/Pain Pain Assessment Pain Assessment:  0-10 Pain Score: 4  Pain Location: 4/10 at rest, 9/10 with any movement of the L LE. Pain Descriptors / Indicators: Aching Pain Intervention(s): Limited activity within patient's tolerance, Monitored during session, Premedicated before session, Repositioned, Ice applied    Home Living Family/patient expects to be discharged to:: Private residence Living Arrangements: Alone Available Help at Discharge: Family;Available 24 hours/day (Daughter available for 1 week.) Type of Home: House Home Access: Stairs to enter Entrance Stairs-Rails: Can reach both Entrance Stairs-Number of Steps: 1 Alternate Level Stairs-Number of Steps: 12 Home  Layout: Two level Home Equipment: Conservation officer, nature (2 wheels);Cane - single point;BSC/3in1;Grab bars - tub/shower;Hand held shower head      Prior Function Prior Level of Function : Independent/Modified Independent                     Hand Dominance   Dominant Hand: Right    Extremity/Trunk Assessment   Upper Extremity Assessment Upper Extremity Assessment: Generalized weakness    Lower Extremity Assessment Lower Extremity Assessment: Generalized weakness;LLE deficits/detail LLE Deficits / Details: s/p THA revision       Communication   Communication: No difficulties  Cognition Arousal/Alertness: Awake/alert Behavior During Therapy: WFL for tasks assessed/performed Overall Cognitive Status: Within Functional Limits for tasks assessed                                          General Comments General comments (skin integrity, edema, etc.): Heavy use of the UE's for support.    Exercises     Assessment/Plan    PT Assessment Patient needs continued PT services  PT Problem List Decreased strength;Decreased range of motion;Decreased activity tolerance;Decreased balance;Decreased mobility;Decreased knowledge of use of DME       PT Treatment Interventions DME instruction;Gait training;Stair training;Functional mobility training;Therapeutic activities;Therapeutic exercise;Balance training;Neuromuscular re-education    PT Goals (Current goals can be found in the Care Plan section)  Acute Rehab PT Goals Patient Stated Goal: to get stronger and go home. PT Goal Formulation: With patient Time For Goal Achievement: 10/11/21 Potential to Achieve Goals: Good    Frequency BID     Co-evaluation               AM-PAC PT "6 Clicks" Mobility  Outcome Measure Help needed turning from your back to your side while in a flat bed without using bedrails?: A Little Help needed moving from lying on your back to sitting on the side of a flat bed without  using bedrails?: A Little Help needed moving to and from a bed to a chair (including a wheelchair)?: A Little Help needed standing up from a chair using your arms (e.g., wheelchair or bedside chair)?: A Little Help needed to walk in hospital room?: A Little Help needed climbing 3-5 steps with a railing? : A Lot 6 Click Score: 17    End of Session Equipment Utilized During Treatment: Gait belt Activity Tolerance: Patient tolerated treatment well Patient left: in bed;with call bell/phone within reach;with bed alarm set Nurse Communication: Mobility status PT Visit Diagnosis: Unsteadiness on feet (R26.81);Other abnormalities of gait and mobility (R26.89);Muscle weakness (generalized) (M62.81)    Time: 5726-2035 PT Time Calculation (min) (ACUTE ONLY): 43 min   Charges:   PT Evaluation $PT Eval Low Complexity: 1 Low PT Treatments $Gait Training: 23-37 mins        Gwenlyn Saran, PT, DPT 09/27/21, 4:52  PM

## 2021-09-27 NOTE — Transfer of Care (Signed)
Immediate Anesthesia Transfer of Care Note  Patient: Cindy Hester  Procedure(s) Performed: TOTAL HIP ARTHROPLASTY (Left: Hip)  Patient Location: PACU  Anesthesia Type:MAC and Spinal  Level of Consciousness: drowsy  Airway & Oxygen Therapy: Patient Spontanous Breathing and Patient connected to face mask oxygen  Post-op Assessment: Report given to RN and Post -op Vital signs reviewed and stable  Post vital signs: Reviewed and stable  Last Vitals:  Vitals Value Taken Time  BP 105/81 09/27/21 1130  Temp    Pulse 73 09/27/21 1133  Resp 18 09/27/21 1133  SpO2 100 % 09/27/21 1133  Vitals shown include unvalidated device data.  Last Pain:  Vitals:   09/27/21 0618  TempSrc: Temporal  PainSc: 6          Complications: No notable events documented.

## 2021-09-27 NOTE — Op Note (Signed)
OPERATIVE NOTE  DATE OF SURGERY:  09/27/2021  PATIENT NAME:  Cindy Hester   DOB: Aug 05, 1945  MRN: [id number]  PRE-OPERATIVE DIAGNOSIS: Degenerative arthrosis of the left hip, primary  POST-OPERATIVE DIAGNOSIS:  Same  PROCEDURE:  Left total hip arthroplasty  SURGEON:  Marciano Sequin. M.D.  ASSISTANT: Cassell Smiles, PA-C (present and scrubbed throughout the case, critical for assistance with exposure, retraction, instrumentation, and closure)  ANESTHESIA: spinal  ESTIMATED BLOOD LOSS: 100 mL  FLUIDS REPLACED: 1100 mL of crystalloid  DRAINS: 2 medium Hemovac drains  IMPLANTS UTILIZED: DePuy 12 mm small stature AML femoral stem, 50 mm OD Pinnacle 100 acetabular component, +4 mm 10 degree Pinnacle Marathon polyethylene insert, and a 32 mm CoCr +1 mm hip ball  INDICATIONS FOR SURGERY: Cindy Hester is a 76 y.o. year old female with a long history of progressive hip and groin  pain. X-rays demonstrated severe degenerative changes. The patient had not seen any significant improvement despite conservative nonsurgical intervention. After discussion of the risks and benefits of surgical intervention, the patient expressed understanding of the risks benefits and agree with plans for total hip arthroplasty.   The risks, benefits, and alternatives were discussed at length including but not limited to the risks of infection, bleeding, nerve injury, stiffness, blood clots, the need for revision surgery, limb length inequality, dislocation, cardiopulmonary complications, among others, and they were willing to proceed.  PROCEDURE IN DETAIL: The patient was brought into the operating room and, after adequate spinal anesthesia was achieved, the patient was placed in a right lateral decubitus position. Axillary roll was placed and all bony prominences were well-padded. [name] left hip was cleaned and prepped with alcohol and DuraPrep and draped in the usual sterile fashion. A "timeout" was  performed as per usual protocol. A lateral curvilinear incision was made gently curving towards the posterior superior iliac spine. The IT band was incised in line with the skin incision and the fibers of the gluteus maximus were split in line. The piriformis tendon was identified, skeletonized, and incised at its insertion to the proximal femur and reflected posteriorly. A T type posterior capsulotomy was performed. Prior to dislocation of the femoral head, a threaded Steinmann pin was inserted through a separate stab incision into the pelvis superior to the acetabulum and bent in the form of a stylus so as to assess limb length and hip offset throughout the procedure. The femoral head was then dislocated posteriorly. Inspection of the femoral head demonstrated severe degenerative changes with full-thickness loss of articular cartilage. The femoral neck cut was performed using an oscillating saw. The anterior capsule was elevated off of the femoral neck using a periosteal elevator. Attention was then directed to the acetabulum. The remnant of the labrum was excised using electrocautery. Inspection of the acetabulum also demonstrated significant degenerative changes. The acetabulum was reamed in sequential fashion up to a 49 mm diameter. Good punctate bleeding bone was encountered. A 50 mm Pinnacle 100 acetabular component was positioned and impacted into place. Good scratch fit was appreciated. A +4 mm neutral polyethylene trial was inserted.  Attention was then directed to the proximal femur. A hole for reaming of the proximal femoral canal was created using a high-speed burr. The femoral canal was reamed in sequential fashion up to a 11.5 mm diameter.  Given the amount of scratch fit distally, it was elected to ream up to a 12 mm diameter to allow for a line to line fit.  Serial broaches were  inserted up to a 12 mm ball stature femoral broach. Calcar region was planed and a trial reduction was performed using a  32 mm hip ball with a +1 mm neck length.  Reasonably good stability was noted but it was elected to trial with a +4 mm 10 degree liner with the high side positioned at the 4 o'clock position.  Good equalization of limb lengths and hip offset was appreciated and excellent stability was noted both anteriorly and posteriorly. Trial components were removed. The acetabular shell was irrigated with copious amounts of normal saline with antibiotic solution and suctioned dry. A +4 mm 10 degree Pinnacle Marathon polyethylene insert was positioned with the high side at the 4 o'clock position and impacted into place. Next, a 12 mm small stature AML femoral stem was positioned and impacted into place. Excellent scratch fit was appreciated. A trial reduction was again performed with a 32 mm hip ball with a +1 mm neck length. Again, good equalization of limb lengths was appreciated and excellent stability appreciated both anteriorly and posteriorly. The hip was then dislocated and the trial hip ball was removed. The Morse taper was cleaned and dried. A 32 mm cobalt chromium hip ball with a +1 mm neck length was placed on the trunnion and impacted into place. The hip was then reduced and placed through range of motion. Excellent stability was appreciated both anteriorly and posteriorly.  The wound was irrigated with copious amounts of normal saline followed by 450 ml of Surgiphor and suctioned dry. Good hemostasis was appreciated. The posterior capsulotomy was repaired using #5 Ethibond. Piriformis tendon was reapproximated to the undersurface of the gluteus medius tendon using #5 Ethibond. The IT band was reapproximated using interrupted sutures of #1 Vicryl. Subcutaneous tissue was approximated using first #0 Vicryl followed by #2-0 Vicryl. The skin was closed with skin staples.  The patient tolerated the procedure well and was transported to the recovery room in stable condition.   Marciano Sequin., M.D.

## 2021-09-27 NOTE — H&P (Signed)
ORTHOPAEDIC HISTORY & PHYSICAL Gwenlyn Fudge, [place] - 09/12/2021 8:15 AM EDT Formatting of this note is different from the original. [place] [facility] Chief Complaint:   Chief Complaint  Patient presents with  Hip Pain  H & P LEFT HIP   History of Present Illness:   Cindy Hester is a 76 y.o. female that presents to clinic today for her preoperative history and evaluation. Patient presents unaccompanied. The patient is scheduled to undergo a left total hip arthroplasty on 09/27/21 by Dr. Marry Guan. Her pain began several years ago. The pain is located along the left hip and groin. She describes her pain as worse with going up and down stairs, getting in and out of the car, and any rotation of the hip. She reports associated She denies associated numbness or tingling.   [name] symptoms have progressed to the point that they decrease her quality of life. The patient has previously undergone conservative treatment including NSAIDS and activity modification without adequate control of her symptoms.  Patient will have her daughter at home afterwards.   Denies history of lumbar surgery, DVT. Is followed by Dr Einar Gip in cardiology.   Patient requests paperwork stating that she is able to work from home.   Past Medical, Surgical, Family, Social History, Allergies, Medications:   Past Medical History:  Past Medical History:  Diagnosis Date  Chronic sinusitis  GERD (gastroesophageal reflux disease)  Heart murmur, unspecified  Herpes genitalis  High cholesterol  Hypertension  Obesity  Osteoarthritis  Sleep apnea  Type 2 diabetes mellitus (CMS-HCC)  Vitamin D deficiency   Past Surgical History:  Past Surgical History:  Procedure Laterality Date  COLONOSCOPY 01/15/2011  Normal Colon: CBF 01/2021  EGD 12/12/2015  Gastritis, Esophagitis: No repeat per RTE  Colon @ [facility] 07/17/2021  Tubular adenoma/Repeat 26yr/SMR  CESAREAN SECTION   Deviated Septum Repair with nasal reconstructive surgery  Eye lid surgery  TONSILLECTOMY  Uvulectomy   Current Medications:  Current Outpatient Medications  Medication Sig Dispense Refill  acetaminophen (TYLENOL) 500 MG tablet Take 1,000 mg by mouth every 8 (eight) hours as needed  amLODIPine (NORVASC) 10 MG tablet Take 10 mg by mouth once daily  aspirin 81 MG EC tablet Take 81 mg by mouth once daily  azelastine (OPTIVAR) 0.05 % ophthalmic solution 1 drop into affected eye  cholecalciferol (VITAMIN D3) 1000 unit tablet Take by mouth Take 5,000 Units by mouth. Twice a week  cyanocobalamin, vitamin B-12, 5,000 mcg TbDL Take 5,000 mcg by mouth twice a week  docusate (COLACE) 100 MG capsule Take 1 capsule (100 mg total) by mouth 2 (two) times daily for 90 days 60 capsule 2  empagliflozin (JARDIANCE) 25 mg tablet TAKE 1 TABLET(25 MG) BY MOUTH EVERY MORNING BEFORE BREAKFAST 90 tablet 0  EPINEPHrine (EPIPEN) 0.3 mg/0.3 mL pen injector epinephrine 0.3 mg/0.3 mL injection, auto-injector INJ UTD  ezetimibe (ZETIA) 10 mg tablet Take 10 mg by mouth once daily  FUROsemide (LASIX) 40 MG tablet Take 20 mg by mouth once daily as needed  glucosamine sulfate 2KCl 1,000 mg Tab Take 2 tablets by mouth once daily  HAIR, SKIN AND NAILS, BIOTIN, ORAL Take 2 tablets by mouth once daily  lactulose (ENULOSE) 10 gram/15 mL oral solution Take 20 mLs by mouth 2 (two) times daily for 90 days 1200 mL 2  metoprolol succinate (TOPROL-XL) 200 MG XL tablet Take 200 mg by mouth once daily  mometasone (NASONEX) 50 mcg/actuation nasal spray Place 2  sprays into both nostrils once daily  montelukast (SINGULAIR) 10 mg tablet Take 10 mg by mouth once daily  multivitamin tablet Take 1 tablet by mouth once daily  NON FORMULARY Inject 1 Dose subcutaneously once a week Allergy shots  oxymetazoline (AFRIN) 0.05 % nasal spray Place 2 sprays into both nostrils at bedtime  pantoprazole (PROTONIX) 40 MG DR tablet Take 40 mg by mouth  once daily  rosuvastatin (CRESTOR) 20 MG tablet Take 20 mg by mouth once daily  semaglutide (OZEMPIC) 1 mg/dose (4 mg/3 mL) pen injector Inject 0.75 mLs (1 mg total) subcutaneously every 7 (seven) days 9 mL 4  spironolactone (ALDACTONE) 25 MG tablet Take 25 mg by mouth once daily  traMADoL (ULTRAM) 50 mg tablet Take 1 tablet (50 mg total) by mouth every 6 (six) hours as needed for Pain for up to 30 doses 30 tablet 0  valACYclovir (VALTREX) 500 MG tablet Take 500 mg by mouth 2 (two) times daily  valsartan-hydrochlorothiazide (DIOVAN-HCT) 160-25 mg tablet Take 1 tablet by mouth once daily  ezetimibe (ZETIA) 10 mg tablet Take by mouth   No current facility-administered medications for this visit.   Allergies:  Allergies  Allergen Reactions  Adhesive Rash  Most tapes cause redness. Paper tape and tegaderm are OK.   Social History:  Social History   Socioeconomic History  Marital status: Single  Number of children: 1  Years of education: 18  Highest education level: Master's degree (e.g., MA, MS, MEng, MEd, MSW, MBA)  Occupational History  Occupation: Animator- Social Service  Tobacco Use  Smoking status: Never  Smokeless tobacco: Never  Vaping Use  Vaping Use: Never used  Substance and Sexual Activity  Alcohol use: Not Currently  Drug use: No  Sexual activity: Defer  Partners: Male   Family History:  Family History  Problem Relation Age of Onset  Diabetes type II Mother  High blood pressure (Hypertension) Father  Heart block Father  Hyperlipidemia (Elevated cholesterol) Father  Diabetes type II Brother  Heart valve disease Brother   Review of Systems:   A 10+ ROS was performed, reviewed, and the pertinent orthopaedic findings are documented in the HPI.   Physical Examination:   BP 120/70 (BP Location: Left upper arm, Patient Position: Sitting, BP Cuff Size: Large Adult)  Ht 168.9 cm (5' 6.5")  Wt 75.8 kg (167 lb 3.2 oz)  LMP (LMP Unknown)  BMI 26.58 kg/m    Patient is a well-developed, well-nourished female in no acute distress. Patient has normal mood and affect. Patient is alert and oriented to person, place, and time.   HEENT: Atraumatic, normocephalic. Pupils equal and reactive to light. Extraocular motion intact. Noninjected sclera.  Cardiovascular: Regular rate and rhythm, with no murmurs, rubs, or gallops. Distal pulses auscultated with doppler.  Respiratory: Lungs clear to auscultation bilaterally.   Left Hip: Pelvic tilt: Negative Limb lengths: Equal with the patient standing Soft tissue swelling: Negative Erythema: Negative Crepitance: Negative Tenderness: Greater trochanter is nontender to palpation. Moderate pain is elicited by axial compression or extremes of rotation. Atrophy: No atrophy. Good hip flexor and abductor strength. Range of Motion: EXT/FLEX: 0/0/110 ADD/ABD: 10/0/20 IR/ER: 15/0/30  Patient able dorsiflex and plantarflex the left ankle. Decreased range of motion of the lesser toes.  Sensation decreased over the saphenous, lateral cutaneous, superficial fibular, absent over the deep fibular nerve distributions.  Tests Performed/Reviewed:  X-rays  No new radiographs were obtained today. Previous radiographs were reviewed of the left hip and revealed loss of femoroacetabular  joint space with significant osteophyte formation.   Impression:   ICD-10-CM  1. Primary osteoarthritis of left hip M16.12   Plan:   The patient has end-stage degenerative changes of the left hip. It was explained to the patient that the condition is progressive in nature. Having failed conservative treatment, the patient has elected to proceed with a total joint arthroplasty. The patient will undergo a total joint arthroplasty with Dr. Marry Guan. The risks of surgery, including blood clot and infection, were discussed with the patient. Measures to reduce these risks, including the use of anticoagulation, perioperative antibiotics, and early  ambulation were discussed. The importance of postoperative physical therapy was discussed with the patient. The patient elects to proceed with surgery. The patient is instructed to stop all blood thinners prior to surgery. The patient is instructed to call the hospital the day before surgery to learn of the proper arrival time.   Contact our office with any questions or concerns. Follow up as indicated, or sooner should any new problems arise, if conditions worsen, or if they are otherwise concerned.   Gwenlyn Fudge, PA-C [facility] [place], [place] [address] [address] Phone: [phone]  This note was generated in part with voice recognition software and I apologize for any typographical errors that were not detected and corrected.  Electronically signed by Gwenlyn Fudge, PA at 09/12/2021 6:19 PM EDT

## 2021-09-27 NOTE — Anesthesia Procedure Notes (Signed)
Spinal  Patient location during procedure: OR Start time: 09/27/2021 7:30 AM End time: 09/27/2021 7:39 AM Staffing Performed: resident/CRNA and other anesthesia staff  Anesthesiologist: Molli Barrows, MD Resident/CRNA: Jerrye Noble, CRNA Other anesthesia staff: Lenord Fellers, RN Performed by: Molli Barrows, MD Authorized by: Molli Barrows, MD   Preanesthetic Checklist Completed: patient identified, IV checked, site marked, risks and benefits discussed, surgical consent, monitors and equipment checked, pre-op evaluation and timeout performed Spinal Block Patient position: sitting Prep: ChloraPrep Patient monitoring: continuous pulse ox and blood pressure Approach: midline Location: L4-5 Injection technique: single-shot Needle Needle type: Pencan  Needle gauge: 24 G Additional Notes Placed by Lenord Fellers, SRNA under supervision.

## 2021-09-27 NOTE — Anesthesia Preprocedure Evaluation (Signed)
Anesthesia Evaluation  Patient identified by MRN, date of birth, ID band Patient awake    Reviewed: Allergy & Precautions, H&P , NPO status , Patient's Chart, lab work & pertinent test results, reviewed documented beta blocker date and time   Airway Mallampati: II   Neck ROM: full    Dental  (+) Poor Dentition   Pulmonary neg pulmonary ROS, sleep apnea and Continuous Positive Airway Pressure Ventilation ,    Pulmonary exam normal        Cardiovascular Exercise Tolerance: Poor hypertension, On Medications negative cardio ROS Normal cardiovascular exam+ dysrhythmias + Valvular Problems/Murmurs  Rhythm:regular Rate:Normal     Neuro/Psych  Neuromuscular disease negative neurological ROS  negative psych ROS   GI/Hepatic Neg liver ROS, GERD  Medicated,  Endo/Other  negative endocrine ROSdiabetes, Well ControlledHypothyroidism   Renal/GU Renal disease  negative genitourinary   Musculoskeletal   Abdominal   Peds  Hematology negative hematology ROS (+)   Anesthesia Other Findings Past Medical History: No date: 1st degree AV block No date: Arthritis     Comment:  right shoulder blade, left thumb No date: Carotid arterial disease ([place])     Comment:  a. 10/2020 U/S: RICA 93-23%, LICA 55-73%. Antegrade bilat              vertebral flow. No date: Chronic venous insufficiency No date: CKD (chronic kidney disease), stage III (HCC) No date: Diabetes mellitus without complication (HCC) No date: GERD (gastroesophageal reflux disease) No date: Heart murmur     Comment:  a. 12/2017 Echo: EF 50-55%, mild conc LVH, GrI DD, Trace              MR/TR. No date: History of esophagitis No date: History of gastritis No date: History of stress test     Comment:  a. 12/2017 MV: HTN response to exercise. EF 68%. Small,               mild, reversible inferior apical defect-->Low risk. No date: HLD (hyperlipidemia) No date: Hypertension No  date: Lymphedema     Comment:  legs No date: Sleep apnea     Comment:  a. Uses CPAP Past Surgical History: No date: BREAST CYST EXCISION; Left     Comment:  removed two times 1986: CESAREAN SECTION 07/17/2021: COLONOSCOPY; N/A     Comment:  Procedure: COLONOSCOPY;  Surgeon: [name],              DO;  Location: [facility] ENDOSCOPY;  Service:               Gastroenterology;  Laterality: N/A;  DM No date: CYST EXCISION     Comment:  from left breast 12/12/2015: ESOPHAGOGASTRODUODENOSCOPY (EGD) WITH PROPOFOL; N/A     Comment:  gastritis, LA Grade A reflux esophagitis               ESOPHAGOGASTRODUODENOSCOPY (EGD) WITH PROPOFOL;  Surgeon:              Manya Silvas, MD;  Location: [facility] ENDOSCOPY;                Service: Endoscopy;  Laterality: N/A;  Diabetic 05/30/2016: HAMMER TOE SURGERY; Bilateral     Comment:  Procedure: HAMMER TOE CORRECTION RIGHT 2ND, 3RD, 4TH,               5TH TOES LEFT 5TH TOE;  Surgeon: Samara Deist, DPM;  Location: [facility];  Service: Podiatry;                Laterality: Bilateral; 2011: HEEL SPUR SURGERY; Right No date: NASAL SINUS SURGERY No date: TONSILLECTOMY BMI    Body Mass Index: 26.23 kg/m     Reproductive/Obstetrics negative OB ROS                             Anesthesia Physical Anesthesia Plan  ASA: 3  Anesthesia Plan: Spinal   Post-op Pain Management:    Induction:   PONV Risk Score and Plan: 3  Airway Management Planned:   Additional Equipment:   Intra-op Plan:   Post-operative Plan:   Informed Consent: I have reviewed the patients History and Physical, chart, labs and discussed the procedure including the risks, benefits and alternatives for the proposed anesthesia with the patient or authorized representative who has indicated his/her understanding and acceptance.     Dental Advisory Given  Plan Discussed with: CRNA  Anesthesia Plan Comments:          Anesthesia Quick Evaluation

## 2021-09-27 NOTE — Interval H&P Note (Signed)
History and Physical Interval Note:  09/27/2021 6:05 AM  Cindy Hester  has presented today for surgery, with the diagnosis of PRIMARY OSTEOARTHRITIS OF LEFT HIP.Marland Kitchen  The various methods of treatment have been discussed with the patient and family. After consideration of risks, benefits and other options for treatment, the patient has consented to  Procedure(s): TOTAL HIP ARTHROPLASTY (Left) as a surgical intervention.  [name] history has been reviewed, patient examined, no change in status, stable for surgery.  I have reviewed the patient's chart and labs.  Questions were answered to the patient's satisfaction.     [place]

## 2021-09-28 LAB — GLUCOSE, CAPILLARY
Glucose-Capillary: 156 mg/dL — ABNORMAL HIGH (ref 70–99)
Glucose-Capillary: 154 mg/dL — ABNORMAL HIGH (ref 70–99)
Glucose-Capillary: 142 mg/dL — ABNORMAL HIGH (ref 70–99)
Glucose-Capillary: 135 mg/dL — ABNORMAL HIGH (ref 70–99)

## 2021-09-28 LAB — SURGICAL PATHOLOGY
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
## 2021-09-28 NOTE — Plan of Care (Signed)
  Problem: Coping: Goal: Ability to adjust to condition or change in health will improve Outcome: Progressing   Problem: Health Behavior/Discharge Planning: Goal: Ability to identify and utilize available resources and services will improve Outcome: Progressing   Problem: Health Behavior/Discharge Planning: Goal: Ability to manage health-related needs will improve Outcome: Progressing   Problem: Metabolic: Goal: Ability to maintain appropriate glucose levels will improve Outcome: Progressing   Problem: Skin Integrity: Goal: Risk for impaired skin integrity will decrease Outcome: Progressing   Problem: Tissue Perfusion: Goal: Adequacy of tissue perfusion will improve Outcome: Progressing   Problem: Education: Goal: Understanding of discharge needs will improve Outcome: Progressing   Problem: Pain Management: Goal: Pain level will decrease with appropriate interventions Outcome: Progressing   Problem: Skin Integrity: Goal: Will show signs of wound healing Outcome: Progressing   Problem: Education: Goal: Knowledge of General Education information will improve Description: Including pain rating scale, medication(s)/side effects and non-pharmacologic comfort measures Outcome: Progressing   Problem: Clinical Measurements: Goal: Will remain free from infection Outcome: Progressing   Problem: Clinical Measurements: Goal: Cardiovascular complication will be avoided Outcome: Progressing   Problem: Pain Managment: Goal: General experience of comfort will improve Outcome: Progressing   Problem: Safety: Goal: Ability to remain free from injury will improve Outcome: Progressing   Problem: Skin Integrity: Goal: Risk for impaired skin integrity will decrease Outcome: Progressing

## 2021-09-28 NOTE — Plan of Care (Signed)
  Problem: Activity: Goal: Ability to avoid complications of mobility impairment will improve Outcome: Progressing   Problem: Pain Management: Goal: Pain level will decrease with appropriate interventions Outcome: Progressing   Problem: Nutrition: Goal: Adequate nutrition will be maintained Outcome: Progressing   

## 2021-09-28 NOTE — Progress Notes (Signed)
Pts BP 108/43, PA Cassell Smiles, notified, orders received to hold all BP meds.

## 2021-09-28 NOTE — Anesthesia Postprocedure Evaluation (Signed)
Anesthesia Post Note  Patient: Cindy Hester  Procedure(s) Performed: TOTAL HIP ARTHROPLASTY (Left: Hip)  Patient location during evaluation: Nursing Unit Anesthesia Type: Spinal Level of consciousness: oriented and awake and alert Pain management: pain level controlled Vital Signs Assessment: post-procedure vital signs reviewed and stable Respiratory status: spontaneous breathing and respiratory function stable Cardiovascular status: blood pressure returned to baseline and stable Postop Assessment: no headache, no backache, no apparent nausea or vomiting and patient able to bend at knees Anesthetic complications: no   No notable events documented.   Last Vitals:  Vitals:   09/27/21 2121 09/28/21 0613  BP: (!) 105/51 113/61  Pulse: 72 72  Resp: 17 17  Temp: 36.6 C 36.7 C  SpO2: 99% 99%    Last Pain:  Vitals:   09/28/21 0655  TempSrc:   PainSc: 7                  Brantley Fling

## 2021-09-28 NOTE — Progress Notes (Signed)
Physical Therapy Treatment Patient Details Name: Cindy Hester MRN: [id number] DOB: 11-21-1945 Today's Date: 09/28/2021   History of Present Illness Pt is s/p L THA with posterior approach on 09/27/21.    PT Comments    Pt received seated in recliner upon arrival to room and pt agreeable to therapy.  Pt noted to be in 7/10 pain with any movement and was given oxycodone approximately an hour prior to therapist arrival according to the pt.  Pt able to come upright but notes significant pain in the L LE.  Pt much slower with movement during PM session and was unable to ambulate as far due to pain and stiffness in the LE.  Pt returned to room to use bathroom and was assisted and then returned to bed for bed-level exercises.  Pt unable to lift L LE to get into the bed and requires modA from therapist to lift the L LE into the bed.  All exercises done within accordance of current precautions.  Pt unable to perform SLR initially without assistance from therapist and has decreased eccentric control as noted by earlier session as well.  Pt left with all needs met and questions answered.  Current discharge plans to SNF remain appropriate at this time.  Pt will continue to benefit from skilled therapy in order to address deficits listed below.    Recommendations for follow up therapy are one component of a multi-disciplinary discharge planning process, led by the attending physician.  Recommendations may be updated based on patient status, additional functional criteria and insurance authorization.  Follow Up Recommendations  Skilled nursing-short term rehab (<3 hours/day) Can patient physically be transported by private vehicle: Yes   Assistance Recommended at Discharge PRN  Patient can return home with the following A little help with walking and/or transfers;A little help with bathing/dressing/bathroom;Help with stairs or ramp for entrance   Equipment Recommendations  None recommended by PT     Recommendations for Other Services       Precautions / Restrictions Precautions Precautions: Fall;Posterior Hip Precaution Booklet Issued: Yes (comment) Restrictions Weight Bearing Restrictions: Yes LLE Weight Bearing: Weight bearing as tolerated     Mobility  Bed Mobility Overal bed mobility: Needs Assistance Bed Mobility: Sit to Supine       Sit to supine: Mod assist   General bed mobility comments: pt upright in recliner upon arrival and returned to bed.  Pt unable to lift L LE to get into the bed.    Transfers Overall transfer level: Needs assistance Equipment used: Rolling walker (2 wheels) Transfers: Sit to/from Stand Sit to Stand: Min guard           General transfer comment: pt reporting significant pain upon attempt to stand (7/10)    Ambulation/Gait Ambulation/Gait assistance: Supervision Gait Distance (Feet): 80 Feet Assistive device: Rolling walker (2 wheels) Gait Pattern/deviations: Step-to pattern, Decreased step length - right, Decreased stance time - left, Decreased weight shift to left Gait velocity: decreased     General Gait Details: Decreased weight shift on the L LE and heavy use of the UE for support.   Stairs             Wheelchair Mobility    Modified Rankin (Stroke Patients Only)       Balance Overall balance assessment: Needs assistance Sitting-balance support: No upper extremity supported, Feet supported Sitting balance-Leahy Scale: Normal     Standing balance support: During functional activity, Bilateral upper extremity supported Standing balance-Leahy Scale: Good  Cognition Arousal/Alertness: Awake/alert Behavior During Therapy: WFL for tasks assessed/performed Overall Cognitive Status: Within Functional Limits for tasks assessed                                          Exercises General Exercises - Lower Extremity Ankle Circles/Pumps: AROM,  Strengthening, Both, 10 reps, Supine Quad Sets: AROM, Strengthening, Both, 10 reps, Supine Gluteal Sets: AROM, Strengthening, Both, 10 reps, Supine Hip ABduction/ADduction: AROM, Strengthening, Both, 10 reps Straight Leg Raises: AROM, Right, AAROM, Left, Strengthening, Both, 10 reps    General Comments        Pertinent Vitals/Pain Pain Assessment Pain Assessment: 0-10 Pain Score: 7  Pain Location: LLE with mobility Pain Descriptors / Indicators: Aching Pain Intervention(s): Limited activity within patient's tolerance, Premedicated before session, Repositioned    Home Living                          Prior Function            PT Goals (current goals can now be found in the care plan section) Acute Rehab PT Goals Patient Stated Goal: to get stronger and go home. PT Goal Formulation: With patient Time For Goal Achievement: 10/11/21 Potential to Achieve Goals: Good Progress towards PT goals: Progressing toward goals    Frequency    BID      PT Plan Current plan remains appropriate    Co-evaluation              AM-PAC PT "6 Clicks" Mobility   Outcome Measure  Help needed turning from your back to your side while in a flat bed without using bedrails?: A Little Help needed moving from lying on your back to sitting on the side of a flat bed without using bedrails?: A Little Help needed moving to and from a bed to a chair (including a wheelchair)?: A Little Help needed standing up from a chair using your arms (e.g., wheelchair or bedside chair)?: A Little Help needed to walk in hospital room?: A Little Help needed climbing 3-5 steps with a railing? : A Lot 6 Click Score: 17    End of Session Equipment Utilized During Treatment: Gait belt Activity Tolerance: Patient tolerated treatment well Patient left: in bed;with call bell/phone within reach;with bed alarm set Nurse Communication: Mobility status PT Visit Diagnosis: Unsteadiness on feet  (R26.81);Other abnormalities of gait and mobility (R26.89);Muscle weakness (generalized) (M62.81)     Time: 8891-6945 PT Time Calculation (min) (ACUTE ONLY): 25 min  Charges:  $Gait Training: 8-22 mins $Therapeutic Exercise: 8-22 mins                     Gwenlyn Saran, PT, DPT 09/28/21, 5:08 PM

## 2021-09-28 NOTE — Progress Notes (Addendum)
  Subjective: 1 Day Post-Op Procedure(s) (LRB): TOTAL HIP ARTHROPLASTY (Left) Patient reports pain as mild.  Patient is well, and has had no acute complaints or problems Plan is to go Home after hospital stay. Negative for chest pain and shortness of breath Fever: no Gastrointestinal: negative for nausea and vomiting.   Patient has not had a bowel movement.  Objective: Vital signs in last 24 hours: Temp:  [97 F (36.1 C)-98.1 F (36.7 C)] 98.1 F (36.7 C) (08/24 0613) Pulse Rate:  [66-74] 72 (08/24 0613) Resp:  [15-23] 17 (08/24 0613) BP: (105-123)/(51-93) 113/61 (08/24 0613) SpO2:  [99 %-100 %] 99 % (08/24 0613)  Intake/Output from previous day:  Intake/Output Summary (Last 24 hours) at 09/28/2021 0818 Last data filed at 09/28/2021 0619 Gross per 24 hour  Intake 2300.91 ml  Output 720 ml  Net 1580.91 ml    Intake/Output this shift: No intake/output data recorded.  Labs: No results for input(s): "HGB" in the last 72 hours. No results for input(s): "WBC", "RBC", "HCT", "PLT" in the last 72 hours. No results for input(s): "NA", "K", "CL", "CO2", "BUN", "CREATININE", "GLUCOSE", "CALCIUM" in the last 72 hours. No results for input(s): "LABPT", "INR" in the last 72 hours.   EXAM General - Patient is Alert, Appropriate, and Oriented Extremity - Neurovascular intact Dorsiflexion/Plantar flexion intact Compartment soft Dressing/Incision -clean, dry, no drainage, Hemovac in place.  Motor Function - intact, moving foot and toes well on exam.  Cardiovascular- Regular rate and rhythm, no murmurs/rubs/gallops Respiratory- Lungs clear to auscultation bilaterally Gastrointestinal- soft, nontender, and active bowel sounds   Assessment/Plan: 1 Day Post-Op Procedure(s) (LRB): TOTAL HIP ARTHROPLASTY (Left) Principal Problem:   Hx of total hip arthroplasty, left  Estimated body mass index is 26.23 kg/m as calculated from the following:   Height as of this encounter: 5' 6.5"  (1.689 m).   Weight as of this encounter: 74.8 kg. Advance diet Up with therapy  Patient did very well with therapy yesterday, encouraged she is currently exceeding expectations.  Hemovac removed. and Mini compression dressing applied.   DVT Prophylaxis - Lovenox, Ted hose, and SCDs Weight-Bearing as tolerated to left leg  Cassell Smiles, PA-C [facility] Orthopaedic Surgery 09/28/2021, 8:18 AM

## 2021-09-28 NOTE — Progress Notes (Signed)
Physical Therapy Treatment Patient Details Name: Cindy Hester MRN: [id number] DOB: 07/11/1945 Today's Date: 09/28/2021   History of Present Illness Pt is s/p L THA with posterior approach on 09/27/21.    PT Comments    Pt received seated in recliner upon arrival to room and pt agreeable to therapy.  Pt notes at rest she is at 4-5/10 pain sitting in recliner.  Pt noted to be somewhat weary of stair training today, but was encouraged throughout session.  When attempting to stand, pt demonstrates good carryover from prior session, however notes 7/10 pain upon standing with dilaudid given prior to therapy session.  Pt ambulated to the PT gym and required a rest break before attempting stair training.  Pt given verbal and visual demonstration of how to perform prior to attempt.  Pt able to perform stair training with appropriate technique, however pt notes that stairs at home are much narrower and not up to code by today's standards.  Pt did require standing rest break prior to continuing with stair training.  Pt tends to fatigue quickly with mobility and notes increased pain throughout mobility.  Pt then transferred back to the recliner where she was able to transfer with decreased eccentric control.  Pt left with legs elevated and pillows butterflied to prevent breaking of precautions, and call bell within reach.  Current discharge plans to SNF remain appropriate at this time.  Pt will continue to benefit from skilled therapy in order to address deficits listed below.  Pt notes that she will be sleeping on a pull-out sofa, which is much lower than her standard bed and would likely be breaking protocol just performing STS to and from bed.  When asked about sleeping in recliner, pt notes that the recliner at home is much less sturdy than the recliner in the hospital, and she has poor eccentric control as is.  Pt was given recommendation for walker tray that can be utilized if she decides with MD that she is  to go home, in order to transfer meals from kitchen to eating area.      Recommendations for follow up therapy are one component of a multi-disciplinary discharge planning process, led by the attending physician.  Recommendations may be updated based on patient status, additional functional criteria and insurance authorization.  Follow Up Recommendations  Skilled nursing-short term rehab (<3 hours/day) Can patient physically be transported by private vehicle: Yes   Assistance Recommended at Discharge PRN  Patient can return home with the following A little help with walking and/or transfers;A little help with bathing/dressing/bathroom;Help with stairs or ramp for entrance   Equipment Recommendations  None recommended by PT    Recommendations for Other Services       Precautions / Restrictions Precautions Precautions: Fall;Posterior Hip Precaution Booklet Issued: Yes (comment) Restrictions Weight Bearing Restrictions: Yes LLE Weight Bearing: Weight bearing as tolerated     Mobility  Bed Mobility               General bed mobility comments: pt upright in recliner upon arrival and returned to the recliner.    Transfers Overall transfer level: Needs assistance Equipment used: Rolling walker (2 wheels) Transfers: Sit to/from Stand Sit to Stand: Min guard           General transfer comment: pt reporting significant pain upon attempts to stand (7/10)    Ambulation/Gait Ambulation/Gait assistance: Supervision Gait Distance (Feet): 90 Feet Assistive device: Rolling walker (2 wheels) Gait Pattern/deviations: Step-to pattern, Decreased step  length - right, Decreased stance time - left, Decreased weight shift to left Gait velocity: decreased     General Gait Details: Decreased weight shift on the L LE and heavy use of the UE for support.   Stairs             Wheelchair Mobility    Modified Rankin (Stroke Patients Only)       Balance Overall balance  assessment: Needs assistance Sitting-balance support: No upper extremity supported, Feet supported Sitting balance-Leahy Scale: Normal     Standing balance support: During functional activity, Bilateral upper extremity supported Standing balance-Leahy Scale: Good                              Cognition Arousal/Alertness: Awake/alert Behavior During Therapy: WFL for tasks assessed/performed Overall Cognitive Status: Within Functional Limits for tasks assessed                                          Exercises      General Comments        Pertinent Vitals/Pain Pain Assessment Pain Assessment: 0-10 Pain Score: 7  Pain Location: LLE with mobility Pain Descriptors / Indicators: Aching Pain Intervention(s): Monitored during session, Premedicated before session, Repositioned    Home Living Family/patient expects to be discharged to:: Private residence Living Arrangements: Alone Available Help at Discharge: Family;Available 24 hours/day;Friend(s);Available PRN/intermittently Type of Home: House Home Access: Stairs to enter Entrance Stairs-Rails: Can reach both Entrance Stairs-Number of Steps: 1 Alternate Level Stairs-Number of Steps: 12 Home Layout: Two level;Able to live on main level with bedroom/bathroom Home Equipment: Rolling Walker (2 wheels);Cane - single point;BSC/3in1;Grab bars - tub/shower;Hand held shower head Additional Comments: daughter staying for 1 week, then friends PRN assist for errands    Prior Function            PT Goals (current goals can now be found in the care plan section) Acute Rehab PT Goals Patient Stated Goal: to get stronger and go home. PT Goal Formulation: With patient Time For Goal Achievement: 10/11/21 Potential to Achieve Goals: Good Progress towards PT goals: Progressing toward goals    Frequency    BID      PT Plan Current plan remains appropriate    Co-evaluation              AM-PAC  PT "6 Clicks" Mobility   Outcome Measure  Help needed turning from your back to your side while in a flat bed without using bedrails?: A Little Help needed moving from lying on your back to sitting on the side of a flat bed without using bedrails?: A Little Help needed moving to and from a bed to a chair (including a wheelchair)?: A Little Help needed standing up from a chair using your arms (e.g., wheelchair or bedside chair)?: A Little Help needed to walk in hospital room?: A Little Help needed climbing 3-5 steps with a railing? : A Lot 6 Click Score: 17    End of Session Equipment Utilized During Treatment: Gait belt Activity Tolerance: Patient tolerated treatment well Patient left: in bed;with call bell/phone within reach;with bed alarm set Nurse Communication: Mobility status PT Visit Diagnosis: Unsteadiness on feet (R26.81);Other abnormalities of gait and mobility (R26.89);Muscle weakness (generalized) (M62.81)     Time: 7824-2353 PT Time Calculation (min) (ACUTE ONLY): 32 min  Charges:  $Gait Training: 23-37 mins                     Gwenlyn Saran, [place], DPT 09/28/21, 12:58 PM

## 2021-09-28 NOTE — Evaluation (Signed)
Occupational Therapy Evaluation Patient Details Name: Cindy Hester MRN: [id number] DOB: 03/18/1945 Today's Date: 09/28/2021   History of Present Illness Pt is s/p L THA on 09/27/21   Clinical Impression   Cindy Hester was seen for OT evaluation this date. Prior to hospital admission, pt was Independent for mobility and ADLs. Pt lives alone in 2 level home, daughter staying for first week. Pt presents to acute OT demonstrating impaired ADL performance and functional mobility 2/2 decreased activity tolerance and functional strength/ROM deficits. Pt currently requires SUPERVISION + RW for toilet t/f and grooming standing sink side, good standing tolerance. MIN cues + reacher don underwear sit<>stand. Pt instructed in functional application of posterior hip pcns, falls prevention strategies, home/routines modifications, DME/AE for LB bathing/dressing tasks, and compression stocking mgt. Handout provided. All education complete, will sign off. Upon hospital discharge, recommend HHOT to maximize pt safety and return to PLOF.   Recommendations for follow up therapy are one component of a multi-disciplinary discharge planning process, led by the attending physician.  Recommendations may be updated based on patient status, additional functional criteria and insurance authorization.   Follow Up Recommendations  Home health OT    Assistance Recommended at Discharge Intermittent Supervision/Assistance  Patient can return home with the following A little help with walking and/or transfers;A little help with bathing/dressing/bathroom    Functional Status Assessment  Patient has had a recent decline in their functional status and demonstrates the ability to make significant improvements in function in a reasonable and predictable amount of time.  Equipment Recommendations  None recommended by OT    Recommendations for Other Services       Precautions / Restrictions Precautions Precautions: Fall;Posterior  Hip Precaution Booklet Issued: Yes (comment) Restrictions Weight Bearing Restrictions: Yes LLE Weight Bearing: Weight bearing as tolerated      Mobility Bed Mobility Overal bed mobility: Needs Assistance Bed Mobility: Supine to Sit     Supine to sit: Supervision     General bed mobility comments: bed rail use    Transfers Overall transfer level: Needs assistance Equipment used: Rolling walker (2 wheels) Transfers: Sit to/from Stand Sit to Stand: Supervision                  Balance Overall balance assessment: Needs assistance Sitting-balance support: No upper extremity supported, Feet supported Sitting balance-Leahy Scale: Normal     Standing balance support: No upper extremity supported, During functional activity Standing balance-Leahy Scale: Good                             ADL either performed or assessed with clinical judgement   ADL Overall ADL's : Needs assistance/impaired                                       General ADL Comments: SUPERVISION + RW for toilet t/f and hand washing standing sink side. MIN cues + reacher don underwear.      Pertinent Vitals/Pain Pain Assessment Pain Assessment: 0-10 Pain Score: 5  Pain Location: LLE Pain Descriptors / Indicators: Aching Pain Intervention(s): Limited activity within patient's tolerance, Premedicated before session, Repositioned     Hand Dominance Right   Extremity/Trunk Assessment Upper Extremity Assessment Upper Extremity Assessment: Overall WFL for tasks assessed   Lower Extremity Assessment Lower Extremity Assessment: Generalized weakness       Communication Communication  Communication: No difficulties   Cognition Arousal/Alertness: Awake/alert Behavior During Therapy: WFL for tasks assessed/performed Overall Cognitive Status: Within Functional Limits for tasks assessed                                                  Home Living  Family/patient expects to be discharged to:: Private residence Living Arrangements: Alone Available Help at Discharge: Family;Available 24 hours/day;Friend(s);Available PRN/intermittently Type of Home: House Home Access: Stairs to enter [employer] of Steps: 1 Entrance Stairs-Rails: Can reach both Home Layout: Two level;Able to live on main level with bedroom/bathroom Alternate Level Stairs-Number of Steps: 12 Alternate Level Stairs-Rails: Left Bathroom Shower/Tub: Tub/shower unit   Bathroom Toilet: Handicapped height     Home Equipment: Conservation officer, nature (2 wheels);Cane - single point;BSC/3in1;Grab bars - tub/shower;Hand held shower head   Additional Comments: daughter staying for 1 week, then friends PRN assist for errands      Prior Functioning/Environment Prior Level of Function : Independent/Modified Independent                        OT Problem List: Decreased strength;Decreased range of motion;Decreased activity tolerance;Impaired balance (sitting and/or standing);Decreased safety awareness         OT Goals(Current goals can be found in the care plan section) Acute Rehab OT Goals Patient Stated Goal: to go home OT Goal Formulation: With patient Time For Goal Achievement: 10/12/21 Potential to Achieve Goals: Good   AM-PAC OT "6 Clicks" Daily Activity     Outcome Measure Help from another person eating meals?: None Help from another person taking care of personal grooming?: A Little Help from another person toileting, which includes using toliet, bedpan, or urinal?: A Little Help from another person bathing (including washing, rinsing, drying)?: A Little Help from another person to put on and taking off regular upper body clothing?: None Help from another person to put on and taking off regular lower body clothing?: A Little 6 Click Score: 20   End of Session Equipment Utilized During Treatment: Rolling walker (2 wheels)  Activity Tolerance: Patient  tolerated treatment well Patient left: in chair;with call bell/phone within reach  OT Visit Diagnosis: Other abnormalities of gait and mobility (R26.89);Muscle weakness (generalized) (M62.81)                Time: 5329-9242 OT Time Calculation (min): 42 min Charges:  OT General Charges $OT Visit: 1 Visit OT Evaluation $OT Eval Moderate Complexity: 1 Mod OT Treatments $Self Care/Home Management : 23-37 mins  Dessie Coma, M.S. OTR/L  09/28/21, 10:18 AM  ascom [phone]

## 2021-09-28 NOTE — Progress Notes (Signed)
Spoke with the patient at the bedside We discussed going to SNF and the option of Home with Home health Her daughter is going to be staying with her for 1 week post DC  She decided that it would be better for her to go home with Home health Her daughter will provide transportation She has DME at home and does not need additional

## 2021-09-29 DIAGNOSIS — N1832 Chronic kidney disease, stage 3b: Secondary | ICD-10-CM | POA: Diagnosis present

## 2021-09-29 DIAGNOSIS — G4733 Obstructive sleep apnea (adult) (pediatric): Secondary | ICD-10-CM | POA: Diagnosis present

## 2021-09-29 DIAGNOSIS — M1612 Unilateral primary osteoarthritis, left hip: Secondary | ICD-10-CM | POA: Diagnosis present

## 2021-09-29 DIAGNOSIS — Z91048 Other nonmedicinal substance allergy status: Secondary | ICD-10-CM | POA: Diagnosis not present

## 2021-09-29 DIAGNOSIS — Z8249 Family history of ischemic heart disease and other diseases of the circulatory system: Secondary | ICD-10-CM | POA: Diagnosis not present

## 2021-09-29 DIAGNOSIS — K219 Gastro-esophageal reflux disease without esophagitis: Secondary | ICD-10-CM | POA: Diagnosis present

## 2021-09-29 DIAGNOSIS — G8929 Other chronic pain: Secondary | ICD-10-CM | POA: Diagnosis present

## 2021-09-29 DIAGNOSIS — I959 Hypotension, unspecified: Secondary | ICD-10-CM | POA: Diagnosis not present

## 2021-09-29 DIAGNOSIS — Z833 Family history of diabetes mellitus: Secondary | ICD-10-CM | POA: Diagnosis not present

## 2021-09-29 DIAGNOSIS — Z7982 Long term (current) use of aspirin: Secondary | ICD-10-CM | POA: Diagnosis not present

## 2021-09-29 DIAGNOSIS — J301 Allergic rhinitis due to pollen: Secondary | ICD-10-CM | POA: Diagnosis present

## 2021-09-29 DIAGNOSIS — J329 Chronic sinusitis, unspecified: Secondary | ICD-10-CM | POA: Diagnosis present

## 2021-09-29 DIAGNOSIS — I872 Venous insufficiency (chronic) (peripheral): Secondary | ICD-10-CM | POA: Diagnosis present

## 2021-09-29 DIAGNOSIS — E669 Obesity, unspecified: Secondary | ICD-10-CM | POA: Diagnosis present

## 2021-09-29 DIAGNOSIS — E1122 Type 2 diabetes mellitus with diabetic chronic kidney disease: Secondary | ICD-10-CM | POA: Diagnosis present

## 2021-09-29 DIAGNOSIS — E7800 Pure hypercholesterolemia, unspecified: Secondary | ICD-10-CM | POA: Diagnosis present

## 2021-09-29 DIAGNOSIS — M1712 Unilateral primary osteoarthritis, left knee: Secondary | ICD-10-CM | POA: Diagnosis present

## 2021-09-29 DIAGNOSIS — Z79899 Other long term (current) drug therapy: Secondary | ICD-10-CM | POA: Diagnosis not present

## 2021-09-29 DIAGNOSIS — M25552 Pain in left hip: Secondary | ICD-10-CM | POA: Diagnosis present

## 2021-09-29 DIAGNOSIS — E559 Vitamin D deficiency, unspecified: Secondary | ICD-10-CM | POA: Diagnosis present

## 2021-09-29 DIAGNOSIS — I129 Hypertensive chronic kidney disease with stage 1 through stage 4 chronic kidney disease, or unspecified chronic kidney disease: Secondary | ICD-10-CM | POA: Diagnosis present

## 2021-09-29 DIAGNOSIS — Z6826 Body mass index (BMI) 26.0-26.9, adult: Secondary | ICD-10-CM | POA: Diagnosis not present

## 2021-09-29 DIAGNOSIS — Z9109 Other allergy status, other than to drugs and biological substances: Secondary | ICD-10-CM | POA: Diagnosis not present

## 2021-09-29 LAB — BASIC METABOLIC PANEL
Anion gap: 9 (ref 5–15)
BUN: 28 mg/dL — ABNORMAL HIGH (ref 8–23)
CO2: 23 mmol/L (ref 22–32)
Calcium: 8.2 mg/dL — ABNORMAL LOW (ref 8.9–10.3)
Chloride: 100 mmol/L (ref 98–111)
Creatinine, Ser: 0.9 mg/dL (ref 0.44–1.00)
GFR, Estimated: >60 mL/min (ref >60)
Glucose, Bld: 122 mg/dL — ABNORMAL HIGH (ref 70–99)
Potassium: 4.4 mmol/L (ref 3.5–5.1)
Sodium: 132 mmol/L — ABNORMAL LOW (ref 135–145)

## 2021-09-29 LAB — GLUCOSE, CAPILLARY
Glucose-Capillary: 119 mg/dL — ABNORMAL HIGH (ref 70–99)
Glucose-Capillary: 163 mg/dL — ABNORMAL HIGH (ref 70–99)
Glucose-Capillary: 133 mg/dL — ABNORMAL HIGH (ref 70–99)
Glucose-Capillary: 106 mg/dL — ABNORMAL HIGH (ref 70–99)

## 2021-09-29 LAB — HEMOGLOBIN AND HEMATOCRIT, BLOOD
HCT: 31.3 % — ABNORMAL LOW (ref 36.0–46.0)
Hemoglobin: 10.4 g/dL — ABNORMAL LOW (ref 12.0–15.0)

## 2021-09-29 MED ORDER — OXYCODONE HCL 5 MG PO TABS: 5.0000 mg | ORAL_TABLET | ORAL | 0 refills | Status: DC | PRN

## 2021-09-29 MED ORDER — CELECOXIB 200 MG PO CAPS: 200.0000 mg | ORAL_CAPSULE | Freq: Two times a day (BID) | ORAL | 0 refills | Status: DC

## 2021-09-29 MED ORDER — METOPROLOL SUCCINATE ER 50 MG PO TB24
50.0000 mg | ORAL_TABLET | Freq: Every day | ORAL | Status: DC
  Administered 2021-09-30: 50 mg via ORAL
  Filled 2021-09-29: qty 1

## 2021-09-29 MED ORDER — TRAMADOL HCL 50 MG PO TABS: 50.0000 mg | ORAL_TABLET | ORAL | 0 refills | Status: DC | PRN

## 2021-09-29 MED ORDER — SODIUM CHLORIDE 0.9 % IV BOLUS
500.0000 mL | Freq: Once | INTRAVENOUS | Status: AC
  Administered 2021-09-29: 500 mL via INTRAVENOUS

## 2021-09-29 MED ORDER — ENOXAPARIN SODIUM 40 MG/0.4ML IJ SOSY: 40.0000 mg | PREFILLED_SYRINGE | INTRAMUSCULAR | 0 refills | Status: DC

## 2021-09-29 NOTE — Plan of Care (Signed)
  Problem: Coping: Goal: Ability to adjust to condition or change in health will improve Outcome: Progressing   Problem: Fluid Volume: Goal: Ability to maintain a balanced intake and output will improve Outcome: Progressing   Problem: Health Behavior/Discharge Planning: Goal: Ability to identify and utilize available resources and services will improve Outcome: Progressing   Problem: Metabolic: Goal: Ability to maintain appropriate glucose levels will improve Outcome: Progressing   Problem: Metabolic: Goal: Ability to maintain appropriate glucose levels will improve Outcome: Progressing   Problem: Nutritional: Goal: Maintenance of adequate nutrition will improve Outcome: Progressing   Problem: Skin Integrity: Goal: Risk for impaired skin integrity will decrease Outcome: Progressing   Problem: Education: Goal: Understanding of discharge needs will improve Outcome: Progressing   Problem: Skin Integrity: Goal: Will show signs of wound healing Outcome: Progressing   Problem: Education: Goal: Knowledge of General Education information will improve Description: Including pain rating scale, medication(s)/side effects and non-pharmacologic comfort measures Outcome: Progressing   Problem: Clinical Measurements: Goal: Will remain free from infection Outcome: Progressing   Problem: Skin Integrity: Goal: Risk for impaired skin integrity will decrease Outcome: Progressing

## 2021-09-29 NOTE — Progress Notes (Addendum)
Physical Therapy Treatment Patient Details Name: Cindy Hester MRN: [id number] DOB: 1945/07/17 Today's Date: 09/29/2021   History of Present Illness Pt is s/p L THA with posterior approach on 09/27/21.    PT Comments    As requested by patient, Pryor Curia return for 2nd session. Rn did inform PT that BP is running a little soft. Sitting in recliner BP 95/65 without symptoms. Pt very eager to stand and ambulate to BR. In standing BP 113/70. Still endorses no symptoms. Proceeded to ambulation to BR. Pt remained asymptomatic until return from bathroom. " I don't feel dizzy, I feel foggy headed." BP recheck once seated in recliner 92/41(57). RN and MD made aware. Throughout session pt still only requiring supervision. Daughter/caregiver for next week was present and does endorse feeling good about pt Dcing home this afternoon. Pt remains apprehensive but agreeable. Pt is cleared from an acute PT standpoint to safely DC home with HHPT to follow.   Recommendations for follow up therapy are one component of a multi-disciplinary discharge planning process, led by the attending physician.  Recommendations may be updated based on patient status, additional functional criteria and insurance authorization.  Follow Up Recommendations  Home health PT     Assistance Recommended at Discharge PRN  Patient can return home with the following A little help with walking and/or transfers;A little help with bathing/dressing/bathroom;Help with stairs or ramp for entrance   Equipment Recommendations  None recommended by PT       Precautions / Restrictions Precautions Precautions: Fall;Posterior Hip Precaution Booklet Issued: Yes (comment) Restrictions Weight Bearing Restrictions: Yes LLE Weight Bearing: Weight bearing as tolerated     Mobility  Bed Mobility  General bed mobility comments: in recliner pre/post session    Transfers Overall transfer level: Needs assistance Equipment used: Rolling walker (2  wheels) Transfers: Sit to/from Stand Sit to Stand: Supervision    General transfer comment: no physical assistance required. Pt is slow moving but able to perform without VCs or lifting assist.    Ambulation/Gait Ambulation/Gait assistance: Supervision Gait Distance (Feet): 25 Feet Assistive device: Rolling walker (2 wheels) Gait Pattern/deviations: Step-through pattern, Decreased step length - right, Decreased stance time - left, Decreased stride length, Antalgic Gait velocity: decreased     General Gait Details: pt easily and safely ambulated to BR.   Stairs Stairs: Yes Stairs assistance: Supervision Stair Management: Two rails, Step to pattern, Forwards Number of Stairs: 4 General stair comments: Pt demonstrated safe ability to ascend/descend 4 stair with BUE support + supervision. she remembered proper sequencing from previous PT sessions. Pt also performed 1 step without rails to simulate threshold of home.Safely able to go up curb step with RW + supervision.    Balance Overall balance assessment: Needs assistance Sitting-balance support: No upper extremity supported, Feet supported Sitting balance-Leahy Scale: Normal     Standing balance support: During functional activity, Bilateral upper extremity supported Standing balance-Leahy Scale: Good Standing balance comment: no LOB or unsteadiness with use of RW. Recommended continued use of RW until HHPT progresses her off of it.      Cognition Arousal/Alertness: Awake/alert Behavior During Therapy: WFL for tasks assessed/performed, Anxious Overall Cognitive Status: Within Functional Limits for tasks assessed      General Comments: pt is A and O x 4           General Comments General comments (skin integrity, edema, etc.): Prior to session, RN contacted Pryor Curia with concerns about low BP. Formal orthostatics taken however pt did not have  symptoms until after she ambulated to BR and successfully urinated and had BM. She  safely walked back to recliner prior to endorsing ," I feel foggy headed." BP 92/41(57) RN and MD made aware.      Pertinent Vitals/Pain Pain Assessment Pain Assessment: No/denies pain Pain Score: 6  Pain Location: LLE with mobility Pain Descriptors / Indicators: Aching Pain Intervention(s): Limited activity within patient's tolerance, Monitored during session, Premedicated before session, Repositioned     PT Goals (current goals can now be found in the care plan section) Acute Rehab PT Goals Patient Stated Goal: to get stronger and go home. Progress towards PT goals: Progressing toward goals    Frequency    BID      PT Plan Current plan remains appropriate       AM-PAC PT "6 Clicks" Mobility   Outcome Measure  Help needed turning from your back to your side while in a flat bed without using bedrails?: A Little Help needed moving from lying on your back to sitting on the side of a flat bed without using bedrails?: A Little Help needed moving to and from a bed to a chair (including a wheelchair)?: A Little Help needed standing up from a chair using your arms (e.g., wheelchair or bedside chair)?: A Little Help needed to walk in hospital room?: A Little Help needed climbing 3-5 steps with a railing? : A Little 6 Click Score: 18    End of Session   Activity Tolerance: Patient tolerated treatment well Patient left: in chair;with call bell/phone within reach;with chair alarm set;with SCD's reapplied Nurse Communication: Mobility status PT Visit Diagnosis: Unsteadiness on feet (R26.81);Other abnormalities of gait and mobility (R26.89);Muscle weakness (generalized) (M62.81)     Time: 4403-4742 PT Time Calculation (min) (ACUTE ONLY): 25 min  Charges:  $Gait Training: 8-22 mins $Therapeutic Activity: 8-22 mins                     Julaine Fusi PTA 09/29/21, 2:33 PM

## 2021-09-29 NOTE — Progress Notes (Signed)
Pts BP remains low, discharge canceled for today.

## 2021-09-29 NOTE — Progress Notes (Addendum)
   Subjective: 2 Days Post-Op Procedure(s) (LRB): TOTAL HIP ARTHROPLASTY (Left) Patient reports pain as mild.   Patient is well, and has had no acute complaints or problems Denies any CP, SOB, ABD pain. We will continue therapy today.  Plan is to go Home after hospital stay.  Objective: Vital signs in last 24 hours: Temp:  [97.6 F (36.4 C)-97.8 F (36.6 C)] 97.7 F (36.5 C) (08/25 0755) Pulse Rate:  [67-84] 84 (08/25 0755) Resp:  [16-17] 17 (08/24 2014) BP: (104-126)/(42-56) 126/54 (08/25 0755) SpO2:  [98 %-100 %] 98 % (08/25 0755)  Intake/Output from previous day: 08/24 0701 - 08/25 0700 In: 960 [P.O.:960] Out: -  Intake/Output this shift: No intake/output data recorded.  No results for input(s): "HGB" in the last 72 hours. No results for input(s): "WBC", "RBC", "HCT", "PLT" in the last 72 hours. No results for input(s): "NA", "K", "CL", "CO2", "BUN", "CREATININE", "GLUCOSE", "CALCIUM" in the last 72 hours. No results for input(s): "LABPT", "INR" in the last 72 hours.  EXAM General - Patient is Alert, Appropriate, and Oriented Extremity - Neurovascular intact Sensation intact distally Intact pulses distally Dorsiflexion/Plantar flexion intact Dressing - dressing C/D/I and no drainage Motor Function - intact, moving foot and toes well on exam.   Past Medical History:  Diagnosis Date   1st degree AV block    Arthritis    right shoulder blade, left thumb   Carotid arterial disease ([place])    a. 10/2020 U/S: RICA 73-41%, LICA 93-79%. Antegrade bilat vertebral flow.   Chronic venous insufficiency    CKD (chronic kidney disease), stage III (HCC)    Diabetes mellitus without complication (HCC)    GERD (gastroesophageal reflux disease)    Heart murmur    a. 12/2017 Echo: EF 50-55%, mild conc LVH, GrI DD, Trace MR/TR.   History of esophagitis    History of gastritis    History of stress test    a. 12/2017 MV: HTN response to exercise. EF 68%. Small, mild, reversible  inferior apical defect-->Low risk.   HLD (hyperlipidemia)    Hypertension    Lymphedema    legs   Sleep apnea    a. Uses CPAP    Assessment/Plan:   2 Days Post-Op Procedure(s) (LRB): TOTAL HIP ARTHROPLASTY (Left) Principal Problem:   Hx of total hip arthroplasty, left  Estimated body mass index is 26.23 kg/m as calculated from the following:   Height as of this encounter: 5' 6.5" (1.689 m).   Weight as of this encounter: 74.8 kg. Advance diet Up with therapy VSS Pain well controlled CM to assist with discharge to home with HHPT today pending safe completion of PT goals    DVT Prophylaxis - Lovenox, TED hose, and SCDs Weight-Bearing as tolerated to Left leg   T. Rachelle Hora, PA-C [place] 09/29/2021, 8:12 AM

## 2021-09-29 NOTE — Plan of Care (Signed)
  Problem: Activity: Goal: Ability to avoid complications of mobility impairment will improve Outcome: Progressing   Problem: Pain Management: Goal: Pain level will decrease with appropriate interventions Outcome: Progressing   Problem: Activity: Goal: Risk for activity intolerance will decrease Outcome: Progressing   Problem: Nutrition: Goal: Adequate nutrition will be maintained Outcome: Progressing

## 2021-09-29 NOTE — Progress Notes (Signed)
ORTHOPAEDICS: Notified by nursing of patient's persistent hypotension. Antihypertensives were held earlier today and the patient was encouraged to push fluid intake.  However, pressures have been consistently low.  Labs were ordered.  Hemoglobin was 10.4, BUN slightly elevated at 28, creatinine at 0.9, and sodium slightly low at 132.  I discussed the patient's status with one of the hospitalist.  We will continue to hold most of the antihypertensives and reduce the metoprolol.  An IV bolus has been started and I have ordered for routine vital signs to be checked throughout the night.  I will suspend the discharge at this time and reassess the patient in the morning.  Findings were discussed with the patient and her family and they were in agreement.  Nataly Pacifico P. Holley Bouche M.D.

## 2021-09-29 NOTE — Progress Notes (Signed)
Pts BP 96/51, Pt asymptomatic. Dr. Marry Guan notified. Pt notified to increase oral intake. Per MD pt should hold BP meds Saturday and Sunday (Norvasc, Irbesartan and Hydrochlorothiazide), and take Metoprolol. Pt verbalized understanding, and advised to check BP at home.   Per MD pt ok to discharge after a couple of hours, and increased oral intake.

## 2021-09-29 NOTE — Progress Notes (Signed)
Pts BP 94/41 and manual BP 98/48. MD Hooten notified. Orders received for 500 ml NS bolus. Per MD, after bolus pt can discharge home.

## 2021-09-29 NOTE — Progress Notes (Signed)
Pts BP 125/52 after NS bolus. MD Hooten made aware.

## 2021-09-29 NOTE — Progress Notes (Addendum)
Physical Therapy Treatment Patient Details Name: Cindy Hester MRN: [id number] DOB: 01/15/1946 Today's Date: 09/29/2021   History of Present Illness Pt is s/p L THA with posterior approach on 09/27/21.    PT Comments    Pt was sitting in recliner upon arriving. She is A and O x 4 and endorses feel better today than previous date. Pt states she is hopeful for DC home this afternoon. She is slightly anxious throughout session but was able to perform all desired task with supervision. She demonstrates safe ability to stand from recliner, ambulate with RW, and perform stairs. She performed 4 stairs to simulate home entry and performed curb step with use of RW to simulate threshold. Tolerated gait 2 x 114f (to/from rehab gym) without LOB. Encouraged continued use of RW until HHPT progresses her off. At conclusion of session, pt was sitting in recliner with BLEs elevated and call bell in reach. She requested to have one more session this afternoon (~12:30) when daughter is present. DC recs updated to home with HHPT.    Recommendations for follow up therapy are one component of a multi-disciplinary discharge planning process, led by the attending physician.  Recommendations may be updated based on patient status, additional functional criteria and insurance authorization.  Follow Up Recommendations  [facility]     Assistance Recommended at Discharge PRN  Patient can return home with the following A little help with walking and/or transfers;A little help with bathing/dressing/bathroom;Help with stairs or ramp for entrance   Equipment Recommendations  None recommended by PT       Precautions / Restrictions Precautions Precautions: Fall;Posterior Hip Precaution Booklet Issued: Yes (comment) Restrictions Weight Bearing Restrictions: Yes LLE Weight Bearing: Weight bearing as tolerated     Mobility  Bed Mobility  General bed mobility comments: in  recliner pre/post session    Transfers Overall transfer level: Needs assistance Equipment used: Rolling walker (2 wheels) Transfers: Sit to/from Stand Sit to Stand: Supervision    General transfer comment: no physical assistance required. Pt is slow moving but able to perform without VCs or lifting assist.    Ambulation/Gait Ambulation/Gait assistance: Supervision   Assistive device: Rolling walker (2 wheels) Gait Pattern/deviations: Step-through pattern, Decreased step length - right, Decreased stance time - left, Decreased stride length, Antalgic Gait velocity: decreased     General Gait Details: pt was able to ambulate 2 x 120 ft to rehab gym with use of RW + supervision. no LOB or unsteadiness noted. Pt just has slow gait cadence.   Stairs Stairs: Yes Stairs assistance: Supervision Stair Management: Two rails, Step to pattern, Forwards Number of Stairs: 4 General stair comments: Pt demonstrated safe ability to ascend/descend 4 stair with BUE support + supervision. she remembered proper sequencing from previous PT sessions. Pt also performed 1 step without rails to simulate threshold of home.Safely able to go up curb step with RW + supervision.    Balance Overall balance assessment: Needs assistance Sitting-balance support: No upper extremity supported, Feet supported Sitting balance-Leahy Scale: Normal     Standing balance support: During functional activity, Bilateral upper extremity supported Standing balance-Leahy Scale: Good Standing balance comment: no LOB or unsteadiness with use of RW. Recommended continued use of RW until HHPT progresses her off of it.    Cognition Arousal/Alertness: Awake/alert Behavior During Therapy: WFL for tasks assessed/performed, Anxious (Pt is a little anxious with mobility.) Overall Cognitive Status: Within Functional Limits for tasks assessed  General Comments: Pt is A and O x 4 but slightly anxious.           General  Comments General comments (skin integrity, edema, etc.): Pt requested author return for one more session prior to [facility] home this afternoon. Will return as requested to review exercises and any concerns with upcoming DC home.      Pertinent Vitals/Pain Pain Assessment Pain Assessment: 0-10 Pain Score: 6  Pain Location: LLE with mobility Pain Descriptors / Indicators: Aching Pain Intervention(s): Limited activity within patient's tolerance, Monitored during session, Premedicated before session, Repositioned     PT Goals (current goals can now be found in the care plan section) Acute Rehab PT Goals Patient Stated Goal: to get stronger and go home. Progress towards PT goals: Progressing toward goals    Frequency    BID      PT Plan Discharge plan needs to be updated (pt is planning to DC home with HHPT this afternoon. Did discuss with ortho PA and MD through secure chat.)       AM-PAC PT "6 Clicks" Mobility   Outcome Measure  Help needed turning from your back to your side while in a flat bed without using bedrails?: A Little Help needed moving from lying on your back to sitting on the side of a flat bed without using bedrails?: A Little Help needed moving to and from a bed to a chair (including a wheelchair)?: A Little Help needed standing up from a chair using your arms (e.g., wheelchair or bedside chair)?: A Little Help needed to walk in hospital room?: A Little Help needed climbing 3-5 steps with a railing? : A Little 6 Click Score: 18    End of Session   Activity Tolerance: Patient tolerated treatment well Patient left: in chair;with call bell/phone within reach;with chair alarm set;with SCD's reapplied Nurse Communication: Mobility status PT Visit Diagnosis: Unsteadiness on feet (R26.81);Other abnormalities of gait and mobility (R26.89);Muscle weakness (generalized) (M62.81)     Time: 6283-1517 PT Time Calculation (min) (ACUTE ONLY): 26 min  Charges:  $Gait  Training: 8-22 mins $Therapeutic Activity: 8-22 mins                     Julaine Fusi PTA 09/29/21, 11:03 AM

## 2021-09-30 LAB — GLUCOSE, CAPILLARY
Glucose-Capillary: 135 mg/dL — ABNORMAL HIGH (ref 70–99)
Glucose-Capillary: 182 mg/dL — ABNORMAL HIGH (ref 70–99)

## 2021-09-30 NOTE — Plan of Care (Signed)
Patient discharged per MD orders at this time.All dc instructions,education and medications reviewed with the patient.Pt expressed understanding and will comply with discharge Instructions.follow up appointments was also communicated to the patient.no verbal c/o or any ssx of distress.patient was discharged home with HH/PT services per order.Pt was transported home by daughter in a privately owned vehicle.

## 2021-09-30 NOTE — Progress Notes (Signed)
   Subjective: 3 Days Post-Op Procedure(s) (LRB): TOTAL HIP ARTHROPLASTY (Left) Patient reports pain as mild.   Patient is well, and has had no acute complaints or problems.  Yesterday patient was hypotensive.  Patient given IV fluids and blood pressure medications held, blood pressure normal today, patient asymptomatic. Denies any CP, SOB, ABD pain. We will continue therapy today.  Plan is to go Home after hospital stay.  Objective: Vital signs in last 24 hours: Temp:  [97.5 F (36.4 C)-99.1 F (37.3 C)] 98.4 F (36.9 C) (08/26 0817) Pulse Rate:  [67-84] 73 (08/26 0817) Resp:  [16-18] 16 (08/26 0442) BP: (94-144)/(41-57) 123/44 (08/26 0817) SpO2:  [98 %-100 %] 99 % (08/26 0817)  Intake/Output from previous day: 08/25 0701 - 08/26 0700 In: 501.3 [IV Piggyback:501.3] Out: -  Intake/Output this shift: No intake/output data recorded.  Recent Labs    09/29/21 1638  HGB 10.4*   Recent Labs    09/29/21 1638  HCT 31.3*   Recent Labs    09/29/21 1638  NA 132*  K 4.4  CL 100  CO2 23  BUN 28*  CREATININE 0.90  GLUCOSE 122*  CALCIUM 8.2*   No results for input(s): "LABPT", "INR" in the last 72 hours.  EXAM General - Patient is Alert, Appropriate, and Oriented Extremity - Neurovascular intact Sensation intact distally Intact pulses distally Dorsiflexion/Plantar flexion intact Dressing - dressing C/D/I and no drainage Motor Function - intact, moving foot and toes well on exam.   Past Medical History:  Diagnosis Date   1st degree AV block    Arthritis    right shoulder blade, left thumb   Carotid arterial disease ([place])    a. 10/2020 U/S: RICA 33-82%, LICA 50-53%. Antegrade bilat vertebral flow.   Chronic venous insufficiency    CKD (chronic kidney disease), stage III (HCC)    Diabetes mellitus without complication (HCC)    GERD (gastroesophageal reflux disease)    Heart murmur    a. 12/2017 Echo: EF 50-55%, mild conc LVH, GrI DD, Trace MR/TR.   History of  esophagitis    History of gastritis    History of stress test    a. 12/2017 MV: HTN response to exercise. EF 68%. Small, mild, reversible inferior apical defect-->Low risk.   HLD (hyperlipidemia)    Hypertension    Lymphedema    legs   Sleep apnea    a. Uses CPAP    Assessment/Plan:   3 Days Post-Op Procedure(s) (LRB): TOTAL HIP ARTHROPLASTY (Left) Principal Problem:   Hx of total hip arthroplasty, left  Estimated body mass index is 26.23 kg/m as calculated from the following:   Height as of this encounter: 5' 6.5" (1.689 m).   Weight as of this encounter: 74.8 kg. Advance diet Up with therapy VSS, blood pressure stable. Pain well controlled CM to assist with discharge to home with HHPT today pending safe completion of PT goals with stable vital signs during PT    DVT Prophylaxis - Lovenox, TED hose, and SCDs Weight-Bearing as tolerated to Left leg   T. Rachelle Hora, PA-C [place] 09/30/2021, 9:16 AM

## 2021-09-30 NOTE — Plan of Care (Signed)
  Problem: Coping: Goal: Ability to adjust to condition or change in health will improve Outcome: Progressing   Problem: Fluid Volume: Goal: Ability to maintain a balanced intake and output will improve Outcome: Progressing   Problem: Nutritional: Goal: Maintenance of adequate nutrition will improve Outcome: Progressing Goal: Progress toward achieving an optimal weight will improve Outcome: Progressing   Problem: Skin Integrity: Goal: Risk for impaired skin integrity will decrease Outcome: Progressing   Problem: Tissue Perfusion: Goal: Adequacy of tissue perfusion will improve Outcome: Progressing   Problem: Activity: Goal: Ability to avoid complications of mobility impairment will improve Outcome: Progressing Goal: Ability to tolerate increased activity will improve Outcome: Progressing   Problem: Clinical Measurements: Goal: Postoperative complications will be avoided or minimized Outcome: Progressing   Problem: Pain Management: Goal: Pain level will decrease with appropriate interventions Outcome: Progressing   Problem: Clinical Measurements: Goal: Will remain free from infection Outcome: Progressing Goal: Cardiovascular complication will be avoided Outcome: Progressing   Problem: Activity: Goal: Risk for activity intolerance will decrease Outcome: Progressing

## 2021-09-30 NOTE — Progress Notes (Signed)
Physical Therapy Treatment Patient Details Name: Cindy Hester MRN: [id number] DOB: 06-24-1945 Today's Date: 09/30/2021   History of Present Illness Pt is s/p L THA with posterior approach on 09/27/21.    PT Comments    Pt was sitting in recliner upon arriving. She is A and O x 4 and agreeable to PT session. BP prior to standing 135/52, standing 132/54, after ambulation ~ 160 ft with RW BP 132/54 (map 77). Pt endorses no symptoms throughout session of hypotension. She was easily and safely able to ambulate without LOB. Reviewed exercises and need for continued routine mobility at DC. RN and PA made aware that she tolerated session well.   Recommendations for follow up therapy are one component of a multi-disciplinary discharge planning process, led by the attending physician.  Recommendations may be updated based on patient status, additional functional criteria and insurance authorization.  Follow Up Recommendations  Home health PT     Assistance Recommended at Discharge PRN  Patient can return home with the following A little help with walking and/or transfers;A little help with bathing/dressing/bathroom;Help with stairs or ramp for entrance   Equipment Recommendations  None recommended by PT       Precautions / Restrictions Precautions Precautions: Fall;Posterior Hip Precaution Booklet Issued: Yes (comment) Restrictions Weight Bearing Restrictions: Yes LLE Weight Bearing: Weight bearing as tolerated     Mobility  Bed Mobility  General bed mobility comments: in recliner pre/post session    Transfers Overall transfer level: Modified independent   Ambulation/Gait Ambulation/Gait assistance: Supervision Gait Distance (Feet): 160 Feet Assistive device: Rolling walker (2 wheels) Gait Pattern/deviations: Step-through pattern, Decreased step length - right, Decreased stance time - left, Decreased stride length, Antalgic Gait velocity: decreased     General Gait Details: pt  demonstrated safe steady gait kinematics without LOB or safety concern.   Stairs    General stair comments: P[facility] Curia offer to perform stair training agin this date however pt feels confident in her abilities and politely declined    Balance Overall balance assessment: Needs assistance Sitting-balance support: No upper extremity supported, Feet supported Sitting balance-Leahy Scale: Normal     Standing balance support: During functional activity, Bilateral upper extremity supported Standing balance-Leahy Scale: Good Standing balance comment: no LOB or unsteadiness with use of RW. Recommended continued use of RW until HHPT progresses her off of it.       Cognition Arousal/Alertness: Awake/alert Behavior During Therapy: WFL for tasks assessed/performed, Anxious Overall Cognitive Status: Within Functional Limits for tasks assessed      General Comments: pt is A and O x 4        Exercises Total Joint Exercises Ankle Circles/Pumps: AROM, 10 reps Quad Sets: AROM, 10 reps Gluteal Sets: AROM, 10 reps Heel Slides: AROM, 10 reps Hip ABduction/ADduction: AAROM, 10 reps Straight Leg Raises: AAROM, 10 reps Long Arc Quad: AROM, 10 reps        Pertinent Vitals/Pain Pain Assessment Pain Assessment: 0-10 Pain Score: 6  Pain Location: LLE with mobility Pain Descriptors / Indicators: Aching Pain Intervention(s): Limited activity within patient's tolerance, Monitored during session, Patient requesting pain meds-RN notified, Repositioned     PT Goals (current goals can now be found in the care plan section) Acute Rehab PT Goals Patient Stated Goal: to get stronger and go home. Progress towards PT goals: Progressing toward goals    Frequency    BID      PT Plan Current plan remains appropriate       AM-PAC  PT "6 Clicks" Mobility   Outcome Measure  Help needed turning from your back to your side while in a flat bed without using bedrails?: A Little Help needed moving from  lying on your back to sitting on the side of a flat bed without using bedrails?: A Little Help needed moving to and from a bed to a chair (including a wheelchair)?: A Little Help needed standing up from a chair using your arms (e.g., wheelchair or bedside chair)?: A Little Help needed to walk in hospital room?: A Little Help needed climbing 3-5 steps with a railing? : A Little 6 Click Score: 18    End of Session   Activity Tolerance: Patient tolerated treatment well Patient left: in chair;with call bell/phone within reach;with chair alarm set;with SCD's reapplied Nurse Communication: Mobility status PT Visit Diagnosis: Unsteadiness on feet (R26.81);Other abnormalities of gait and mobility (R26.89);Muscle weakness (generalized) (M62.81)     Time: 5697-9480 PT Time Calculation (min) (ACUTE ONLY): 10 min  Charges:  $Gait Training: 8-22 mins                     Julaine Fusi PTA 09/30/21, 11:20 AM

## 2021-10-01 DIAGNOSIS — E559 Vitamin D deficiency, unspecified: Secondary | ICD-10-CM | POA: Diagnosis not present

## 2021-10-01 DIAGNOSIS — Z96641 Presence of right artificial hip joint: Secondary | ICD-10-CM | POA: Diagnosis not present

## 2021-10-01 DIAGNOSIS — E039 Hypothyroidism, unspecified: Secondary | ICD-10-CM | POA: Diagnosis not present

## 2021-10-01 DIAGNOSIS — N1832 Chronic kidney disease, stage 3b: Secondary | ICD-10-CM | POA: Diagnosis not present

## 2021-10-01 DIAGNOSIS — I872 Venous insufficiency (chronic) (peripheral): Secondary | ICD-10-CM | POA: Diagnosis not present

## 2021-10-01 DIAGNOSIS — I890 Lymphedema, not elsewhere classified: Secondary | ICD-10-CM | POA: Diagnosis not present

## 2021-10-01 DIAGNOSIS — A6000 Herpesviral infection of urogenital system, unspecified: Secondary | ICD-10-CM | POA: Diagnosis not present

## 2021-10-01 DIAGNOSIS — E1122 Type 2 diabetes mellitus with diabetic chronic kidney disease: Secondary | ICD-10-CM | POA: Diagnosis not present

## 2021-10-01 DIAGNOSIS — E669 Obesity, unspecified: Secondary | ICD-10-CM | POA: Diagnosis not present

## 2021-10-01 DIAGNOSIS — G4733 Obstructive sleep apnea (adult) (pediatric): Secondary | ICD-10-CM | POA: Diagnosis not present

## 2021-10-01 DIAGNOSIS — Z471 Aftercare following joint replacement surgery: Secondary | ICD-10-CM | POA: Diagnosis not present

## 2021-10-01 DIAGNOSIS — I129 Hypertensive chronic kidney disease with stage 1 through stage 4 chronic kidney disease, or unspecified chronic kidney disease: Secondary | ICD-10-CM | POA: Diagnosis not present

## 2021-10-01 DIAGNOSIS — E7800 Pure hypercholesterolemia, unspecified: Secondary | ICD-10-CM | POA: Diagnosis not present

## 2021-10-01 DIAGNOSIS — K219 Gastro-esophageal reflux disease without esophagitis: Secondary | ICD-10-CM | POA: Diagnosis not present

## 2021-10-01 DIAGNOSIS — J301 Allergic rhinitis due to pollen: Secondary | ICD-10-CM | POA: Diagnosis not present

## 2021-10-01 DIAGNOSIS — Z6826 Body mass index (BMI) 26.0-26.9, adult: Secondary | ICD-10-CM | POA: Diagnosis not present

## 2021-10-03 ENCOUNTER — Encounter: Payer: BC Managed Care – PPO | Admitting: Family Medicine

## 2021-10-04 DIAGNOSIS — E1122 Type 2 diabetes mellitus with diabetic chronic kidney disease: Secondary | ICD-10-CM | POA: Diagnosis not present

## 2021-10-04 DIAGNOSIS — E039 Hypothyroidism, unspecified: Secondary | ICD-10-CM | POA: Diagnosis not present

## 2021-10-04 DIAGNOSIS — A6000 Herpesviral infection of urogenital system, unspecified: Secondary | ICD-10-CM | POA: Diagnosis not present

## 2021-10-04 DIAGNOSIS — I129 Hypertensive chronic kidney disease with stage 1 through stage 4 chronic kidney disease, or unspecified chronic kidney disease: Secondary | ICD-10-CM | POA: Diagnosis not present

## 2021-10-04 DIAGNOSIS — E7800 Pure hypercholesterolemia, unspecified: Secondary | ICD-10-CM | POA: Diagnosis not present

## 2021-10-04 DIAGNOSIS — Z471 Aftercare following joint replacement surgery: Secondary | ICD-10-CM | POA: Diagnosis not present

## 2021-10-04 DIAGNOSIS — G4733 Obstructive sleep apnea (adult) (pediatric): Secondary | ICD-10-CM | POA: Diagnosis not present

## 2021-10-04 DIAGNOSIS — J301 Allergic rhinitis due to pollen: Secondary | ICD-10-CM | POA: Diagnosis not present

## 2021-10-04 DIAGNOSIS — K219 Gastro-esophageal reflux disease without esophagitis: Secondary | ICD-10-CM | POA: Diagnosis not present

## 2021-10-04 DIAGNOSIS — E559 Vitamin D deficiency, unspecified: Secondary | ICD-10-CM | POA: Diagnosis not present

## 2021-10-04 DIAGNOSIS — Z96641 Presence of right artificial hip joint: Secondary | ICD-10-CM | POA: Diagnosis not present

## 2021-10-04 DIAGNOSIS — E669 Obesity, unspecified: Secondary | ICD-10-CM | POA: Diagnosis not present

## 2021-10-04 DIAGNOSIS — I890 Lymphedema, not elsewhere classified: Secondary | ICD-10-CM | POA: Diagnosis not present

## 2021-10-04 DIAGNOSIS — I872 Venous insufficiency (chronic) (peripheral): Secondary | ICD-10-CM | POA: Diagnosis not present

## 2021-10-04 DIAGNOSIS — N1832 Chronic kidney disease, stage 3b: Secondary | ICD-10-CM | POA: Diagnosis not present

## 2021-10-04 DIAGNOSIS — Z6826 Body mass index (BMI) 26.0-26.9, adult: Secondary | ICD-10-CM | POA: Diagnosis not present

## 2021-10-11 DIAGNOSIS — Z6826 Body mass index (BMI) 26.0-26.9, adult: Secondary | ICD-10-CM | POA: Diagnosis not present

## 2021-10-11 DIAGNOSIS — Z471 Aftercare following joint replacement surgery: Secondary | ICD-10-CM | POA: Diagnosis not present

## 2021-10-11 DIAGNOSIS — J301 Allergic rhinitis due to pollen: Secondary | ICD-10-CM | POA: Diagnosis not present

## 2021-10-11 DIAGNOSIS — G4733 Obstructive sleep apnea (adult) (pediatric): Secondary | ICD-10-CM | POA: Diagnosis not present

## 2021-10-11 DIAGNOSIS — Z96641 Presence of right artificial hip joint: Secondary | ICD-10-CM | POA: Diagnosis not present

## 2021-10-11 DIAGNOSIS — E559 Vitamin D deficiency, unspecified: Secondary | ICD-10-CM | POA: Diagnosis not present

## 2021-10-11 DIAGNOSIS — E7800 Pure hypercholesterolemia, unspecified: Secondary | ICD-10-CM | POA: Diagnosis not present

## 2021-10-11 DIAGNOSIS — A6000 Herpesviral infection of urogenital system, unspecified: Secondary | ICD-10-CM | POA: Diagnosis not present

## 2021-10-11 DIAGNOSIS — I890 Lymphedema, not elsewhere classified: Secondary | ICD-10-CM | POA: Diagnosis not present

## 2021-10-11 DIAGNOSIS — N1832 Chronic kidney disease, stage 3b: Secondary | ICD-10-CM | POA: Diagnosis not present

## 2021-10-11 DIAGNOSIS — I129 Hypertensive chronic kidney disease with stage 1 through stage 4 chronic kidney disease, or unspecified chronic kidney disease: Secondary | ICD-10-CM | POA: Diagnosis not present

## 2021-10-11 DIAGNOSIS — E039 Hypothyroidism, unspecified: Secondary | ICD-10-CM | POA: Diagnosis not present

## 2021-10-11 DIAGNOSIS — E1122 Type 2 diabetes mellitus with diabetic chronic kidney disease: Secondary | ICD-10-CM | POA: Diagnosis not present

## 2021-10-11 DIAGNOSIS — I872 Venous insufficiency (chronic) (peripheral): Secondary | ICD-10-CM | POA: Diagnosis not present

## 2021-10-11 DIAGNOSIS — E669 Obesity, unspecified: Secondary | ICD-10-CM | POA: Diagnosis not present

## 2021-10-11 DIAGNOSIS — K219 Gastro-esophageal reflux disease without esophagitis: Secondary | ICD-10-CM | POA: Diagnosis not present

## 2021-10-13 DIAGNOSIS — Z471 Aftercare following joint replacement surgery: Secondary | ICD-10-CM | POA: Diagnosis not present

## 2021-10-13 DIAGNOSIS — E039 Hypothyroidism, unspecified: Secondary | ICD-10-CM | POA: Diagnosis not present

## 2021-10-13 DIAGNOSIS — A6000 Herpesviral infection of urogenital system, unspecified: Secondary | ICD-10-CM | POA: Diagnosis not present

## 2021-10-13 DIAGNOSIS — I129 Hypertensive chronic kidney disease with stage 1 through stage 4 chronic kidney disease, or unspecified chronic kidney disease: Secondary | ICD-10-CM | POA: Diagnosis not present

## 2021-10-13 DIAGNOSIS — N1832 Chronic kidney disease, stage 3b: Secondary | ICD-10-CM | POA: Diagnosis not present

## 2021-10-13 DIAGNOSIS — E559 Vitamin D deficiency, unspecified: Secondary | ICD-10-CM | POA: Diagnosis not present

## 2021-10-13 DIAGNOSIS — I872 Venous insufficiency (chronic) (peripheral): Secondary | ICD-10-CM | POA: Diagnosis not present

## 2021-10-13 DIAGNOSIS — E1122 Type 2 diabetes mellitus with diabetic chronic kidney disease: Secondary | ICD-10-CM | POA: Diagnosis not present

## 2021-10-13 DIAGNOSIS — Z6826 Body mass index (BMI) 26.0-26.9, adult: Secondary | ICD-10-CM | POA: Diagnosis not present

## 2021-10-13 DIAGNOSIS — J301 Allergic rhinitis due to pollen: Secondary | ICD-10-CM | POA: Diagnosis not present

## 2021-10-13 DIAGNOSIS — K219 Gastro-esophageal reflux disease without esophagitis: Secondary | ICD-10-CM | POA: Diagnosis not present

## 2021-10-13 DIAGNOSIS — G4733 Obstructive sleep apnea (adult) (pediatric): Secondary | ICD-10-CM | POA: Diagnosis not present

## 2021-10-13 DIAGNOSIS — E7800 Pure hypercholesterolemia, unspecified: Secondary | ICD-10-CM | POA: Diagnosis not present

## 2021-10-13 DIAGNOSIS — E669 Obesity, unspecified: Secondary | ICD-10-CM | POA: Diagnosis not present

## 2021-10-13 DIAGNOSIS — I890 Lymphedema, not elsewhere classified: Secondary | ICD-10-CM | POA: Diagnosis not present

## 2021-10-13 DIAGNOSIS — Z96641 Presence of right artificial hip joint: Secondary | ICD-10-CM | POA: Diagnosis not present

## 2021-10-16 DIAGNOSIS — E559 Vitamin D deficiency, unspecified: Secondary | ICD-10-CM | POA: Diagnosis not present

## 2021-10-16 DIAGNOSIS — Z6826 Body mass index (BMI) 26.0-26.9, adult: Secondary | ICD-10-CM | POA: Diagnosis not present

## 2021-10-16 DIAGNOSIS — J301 Allergic rhinitis due to pollen: Secondary | ICD-10-CM | POA: Diagnosis not present

## 2021-10-16 DIAGNOSIS — E669 Obesity, unspecified: Secondary | ICD-10-CM | POA: Diagnosis not present

## 2021-10-16 DIAGNOSIS — E1122 Type 2 diabetes mellitus with diabetic chronic kidney disease: Secondary | ICD-10-CM | POA: Diagnosis not present

## 2021-10-16 DIAGNOSIS — I129 Hypertensive chronic kidney disease with stage 1 through stage 4 chronic kidney disease, or unspecified chronic kidney disease: Secondary | ICD-10-CM | POA: Diagnosis not present

## 2021-10-16 DIAGNOSIS — K219 Gastro-esophageal reflux disease without esophagitis: Secondary | ICD-10-CM | POA: Diagnosis not present

## 2021-10-16 DIAGNOSIS — Z471 Aftercare following joint replacement surgery: Secondary | ICD-10-CM | POA: Diagnosis not present

## 2021-10-16 DIAGNOSIS — E039 Hypothyroidism, unspecified: Secondary | ICD-10-CM | POA: Diagnosis not present

## 2021-10-16 DIAGNOSIS — Z96641 Presence of right artificial hip joint: Secondary | ICD-10-CM | POA: Diagnosis not present

## 2021-10-16 DIAGNOSIS — I890 Lymphedema, not elsewhere classified: Secondary | ICD-10-CM | POA: Diagnosis not present

## 2021-10-16 DIAGNOSIS — I872 Venous insufficiency (chronic) (peripheral): Secondary | ICD-10-CM | POA: Diagnosis not present

## 2021-10-16 DIAGNOSIS — G4733 Obstructive sleep apnea (adult) (pediatric): Secondary | ICD-10-CM | POA: Diagnosis not present

## 2021-10-16 DIAGNOSIS — N1832 Chronic kidney disease, stage 3b: Secondary | ICD-10-CM | POA: Diagnosis not present

## 2021-10-16 DIAGNOSIS — A6000 Herpesviral infection of urogenital system, unspecified: Secondary | ICD-10-CM | POA: Diagnosis not present

## 2021-10-16 DIAGNOSIS — E7800 Pure hypercholesterolemia, unspecified: Secondary | ICD-10-CM | POA: Diagnosis not present

## 2021-10-17 DIAGNOSIS — Z471 Aftercare following joint replacement surgery: Secondary | ICD-10-CM | POA: Diagnosis not present

## 2021-10-17 DIAGNOSIS — I129 Hypertensive chronic kidney disease with stage 1 through stage 4 chronic kidney disease, or unspecified chronic kidney disease: Secondary | ICD-10-CM | POA: Diagnosis not present

## 2021-10-17 DIAGNOSIS — E1122 Type 2 diabetes mellitus with diabetic chronic kidney disease: Secondary | ICD-10-CM | POA: Diagnosis not present

## 2021-10-17 DIAGNOSIS — N1832 Chronic kidney disease, stage 3b: Secondary | ICD-10-CM | POA: Diagnosis not present

## 2021-10-19 DIAGNOSIS — Z96641 Presence of right artificial hip joint: Secondary | ICD-10-CM | POA: Diagnosis not present

## 2021-10-19 DIAGNOSIS — E039 Hypothyroidism, unspecified: Secondary | ICD-10-CM | POA: Diagnosis not present

## 2021-10-19 DIAGNOSIS — E559 Vitamin D deficiency, unspecified: Secondary | ICD-10-CM | POA: Diagnosis not present

## 2021-10-19 DIAGNOSIS — I872 Venous insufficiency (chronic) (peripheral): Secondary | ICD-10-CM | POA: Diagnosis not present

## 2021-10-19 DIAGNOSIS — A6000 Herpesviral infection of urogenital system, unspecified: Secondary | ICD-10-CM | POA: Diagnosis not present

## 2021-10-19 DIAGNOSIS — I129 Hypertensive chronic kidney disease with stage 1 through stage 4 chronic kidney disease, or unspecified chronic kidney disease: Secondary | ICD-10-CM | POA: Diagnosis not present

## 2021-10-19 DIAGNOSIS — N1832 Chronic kidney disease, stage 3b: Secondary | ICD-10-CM | POA: Diagnosis not present

## 2021-10-19 DIAGNOSIS — Z471 Aftercare following joint replacement surgery: Secondary | ICD-10-CM | POA: Diagnosis not present

## 2021-10-19 DIAGNOSIS — G4730 Sleep apnea, unspecified: Secondary | ICD-10-CM | POA: Diagnosis not present

## 2021-10-19 DIAGNOSIS — E1122 Type 2 diabetes mellitus with diabetic chronic kidney disease: Secondary | ICD-10-CM | POA: Diagnosis not present

## 2021-10-19 DIAGNOSIS — Z6826 Body mass index (BMI) 26.0-26.9, adult: Secondary | ICD-10-CM | POA: Diagnosis not present

## 2021-10-19 DIAGNOSIS — E7800 Pure hypercholesterolemia, unspecified: Secondary | ICD-10-CM | POA: Diagnosis not present

## 2021-10-19 DIAGNOSIS — G4733 Obstructive sleep apnea (adult) (pediatric): Secondary | ICD-10-CM | POA: Diagnosis not present

## 2021-10-19 DIAGNOSIS — I890 Lymphedema, not elsewhere classified: Secondary | ICD-10-CM | POA: Diagnosis not present

## 2021-10-19 DIAGNOSIS — J301 Allergic rhinitis due to pollen: Secondary | ICD-10-CM | POA: Diagnosis not present

## 2021-10-19 DIAGNOSIS — E669 Obesity, unspecified: Secondary | ICD-10-CM | POA: Diagnosis not present

## 2021-10-19 DIAGNOSIS — K219 Gastro-esophageal reflux disease without esophagitis: Secondary | ICD-10-CM | POA: Diagnosis not present

## 2021-10-21 DIAGNOSIS — G4730 Sleep apnea, unspecified: Secondary | ICD-10-CM | POA: Diagnosis not present

## 2021-10-23 DIAGNOSIS — I872 Venous insufficiency (chronic) (peripheral): Secondary | ICD-10-CM | POA: Diagnosis not present

## 2021-10-23 DIAGNOSIS — Z471 Aftercare following joint replacement surgery: Secondary | ICD-10-CM | POA: Diagnosis not present

## 2021-10-23 DIAGNOSIS — I890 Lymphedema, not elsewhere classified: Secondary | ICD-10-CM | POA: Diagnosis not present

## 2021-10-23 DIAGNOSIS — Z96641 Presence of right artificial hip joint: Secondary | ICD-10-CM | POA: Diagnosis not present

## 2021-10-23 DIAGNOSIS — Z6826 Body mass index (BMI) 26.0-26.9, adult: Secondary | ICD-10-CM | POA: Diagnosis not present

## 2021-10-23 DIAGNOSIS — N1832 Chronic kidney disease, stage 3b: Secondary | ICD-10-CM | POA: Diagnosis not present

## 2021-10-23 DIAGNOSIS — E559 Vitamin D deficiency, unspecified: Secondary | ICD-10-CM | POA: Diagnosis not present

## 2021-10-23 DIAGNOSIS — E1122 Type 2 diabetes mellitus with diabetic chronic kidney disease: Secondary | ICD-10-CM | POA: Diagnosis not present

## 2021-10-23 DIAGNOSIS — E669 Obesity, unspecified: Secondary | ICD-10-CM | POA: Diagnosis not present

## 2021-10-23 DIAGNOSIS — E7800 Pure hypercholesterolemia, unspecified: Secondary | ICD-10-CM | POA: Diagnosis not present

## 2021-10-23 DIAGNOSIS — I129 Hypertensive chronic kidney disease with stage 1 through stage 4 chronic kidney disease, or unspecified chronic kidney disease: Secondary | ICD-10-CM | POA: Diagnosis not present

## 2021-10-23 DIAGNOSIS — G4733 Obstructive sleep apnea (adult) (pediatric): Secondary | ICD-10-CM | POA: Diagnosis not present

## 2021-10-23 DIAGNOSIS — A6000 Herpesviral infection of urogenital system, unspecified: Secondary | ICD-10-CM | POA: Diagnosis not present

## 2021-10-23 DIAGNOSIS — J301 Allergic rhinitis due to pollen: Secondary | ICD-10-CM | POA: Diagnosis not present

## 2021-10-23 DIAGNOSIS — E039 Hypothyroidism, unspecified: Secondary | ICD-10-CM | POA: Diagnosis not present

## 2021-10-23 DIAGNOSIS — K219 Gastro-esophageal reflux disease without esophagitis: Secondary | ICD-10-CM | POA: Diagnosis not present

## 2021-10-24 DIAGNOSIS — J3089 Other allergic rhinitis: Secondary | ICD-10-CM | POA: Diagnosis not present

## 2021-10-24 DIAGNOSIS — J301 Allergic rhinitis due to pollen: Secondary | ICD-10-CM | POA: Diagnosis not present

## 2021-10-25 DIAGNOSIS — K219 Gastro-esophageal reflux disease without esophagitis: Secondary | ICD-10-CM | POA: Diagnosis not present

## 2021-10-25 DIAGNOSIS — Z471 Aftercare following joint replacement surgery: Secondary | ICD-10-CM | POA: Diagnosis not present

## 2021-10-25 DIAGNOSIS — N1832 Chronic kidney disease, stage 3b: Secondary | ICD-10-CM | POA: Diagnosis not present

## 2021-10-25 DIAGNOSIS — A6000 Herpesviral infection of urogenital system, unspecified: Secondary | ICD-10-CM | POA: Diagnosis not present

## 2021-10-25 DIAGNOSIS — Z6826 Body mass index (BMI) 26.0-26.9, adult: Secondary | ICD-10-CM | POA: Diagnosis not present

## 2021-10-25 DIAGNOSIS — G4733 Obstructive sleep apnea (adult) (pediatric): Secondary | ICD-10-CM | POA: Diagnosis not present

## 2021-10-25 DIAGNOSIS — E1122 Type 2 diabetes mellitus with diabetic chronic kidney disease: Secondary | ICD-10-CM | POA: Diagnosis not present

## 2021-10-25 DIAGNOSIS — E559 Vitamin D deficiency, unspecified: Secondary | ICD-10-CM | POA: Diagnosis not present

## 2021-10-25 DIAGNOSIS — J301 Allergic rhinitis due to pollen: Secondary | ICD-10-CM | POA: Diagnosis not present

## 2021-10-25 DIAGNOSIS — I129 Hypertensive chronic kidney disease with stage 1 through stage 4 chronic kidney disease, or unspecified chronic kidney disease: Secondary | ICD-10-CM | POA: Diagnosis not present

## 2021-10-25 DIAGNOSIS — E039 Hypothyroidism, unspecified: Secondary | ICD-10-CM | POA: Diagnosis not present

## 2021-10-25 DIAGNOSIS — E7800 Pure hypercholesterolemia, unspecified: Secondary | ICD-10-CM | POA: Diagnosis not present

## 2021-10-25 DIAGNOSIS — I890 Lymphedema, not elsewhere classified: Secondary | ICD-10-CM | POA: Diagnosis not present

## 2021-10-25 DIAGNOSIS — I872 Venous insufficiency (chronic) (peripheral): Secondary | ICD-10-CM | POA: Diagnosis not present

## 2021-10-25 DIAGNOSIS — E669 Obesity, unspecified: Secondary | ICD-10-CM | POA: Diagnosis not present

## 2021-10-25 DIAGNOSIS — Z96641 Presence of right artificial hip joint: Secondary | ICD-10-CM | POA: Diagnosis not present

## 2021-10-26 ENCOUNTER — Other Ambulatory Visit: Payer: Self-pay | Admitting: Family Medicine

## 2021-10-27 ENCOUNTER — Encounter: Payer: Self-pay | Admitting: Family Medicine

## 2021-10-27 ENCOUNTER — Ambulatory Visit (INDEPENDENT_AMBULATORY_CARE_PROVIDER_SITE_OTHER): Payer: BC Managed Care – PPO | Admitting: Family Medicine

## 2021-10-27 VITALS — BP 111/68 | HR 76 | Temp 98.0°F | Resp 16 | Ht 66.5 in | Wt 163.7 lb

## 2021-10-27 DIAGNOSIS — E119 Type 2 diabetes mellitus without complications: Secondary | ICD-10-CM

## 2021-10-27 DIAGNOSIS — Z0000 Encounter for general adult medical examination without abnormal findings: Secondary | ICD-10-CM

## 2021-10-27 DIAGNOSIS — R238 Other skin changes: Secondary | ICD-10-CM | POA: Insufficient documentation

## 2021-10-27 DIAGNOSIS — I10 Essential (primary) hypertension: Secondary | ICD-10-CM | POA: Diagnosis not present

## 2021-10-27 DIAGNOSIS — E039 Hypothyroidism, unspecified: Secondary | ICD-10-CM

## 2021-10-27 DIAGNOSIS — E1159 Type 2 diabetes mellitus with other circulatory complications: Secondary | ICD-10-CM | POA: Diagnosis not present

## 2021-10-27 DIAGNOSIS — I152 Hypertension secondary to endocrine disorders: Secondary | ICD-10-CM | POA: Diagnosis not present

## 2021-10-27 DIAGNOSIS — E559 Vitamin D deficiency, unspecified: Secondary | ICD-10-CM

## 2021-10-27 DIAGNOSIS — Z23 Encounter for immunization: Secondary | ICD-10-CM | POA: Diagnosis not present

## 2021-10-27 DIAGNOSIS — G4733 Obstructive sleep apnea (adult) (pediatric): Secondary | ICD-10-CM

## 2021-10-27 DIAGNOSIS — E782 Mixed hyperlipidemia: Secondary | ICD-10-CM | POA: Diagnosis not present

## 2021-10-27 MED ORDER — AMLODIPINE BESYLATE 10 MG PO TABS: ORAL_TABLET | ORAL | 1 refills | Status: DC

## 2021-10-27 NOTE — Progress Notes (Signed)
Complete physical exam  I,Cindy Hester,acting as a scribe for [employer], MD.,have documented all relevant documentation on the behalf of Cindy Foster, MD,as directed by  Cindy Foster, MD while in the presence of Cindy Foster, MD.   Patient: Cindy Hester   DOB: 14-May-1945   76 y.o. Female  MRN: [id number] Visit Date: 10/27/2021  Today's healthcare provider: [name], MD   Chief Complaint  Patient presents with   Annual Exam   Subjective    Cindy Hester is a 76 y.o. female who presents today for a complete physical exam.    She reports consuming a general diet.  Exercises include PT twice a week.  She generally feels well. She reports sleeping fairly well. She does have additional problems to discuss today.   OSA Would like to get a new cpap machine. Reports using this regularly and that it helps her to sleep well. Last one is from 2016. She was initially seen by Dr. Rosalio Macadamia followed by Dr. Raul Del. She has been managed by Dr. Rosanna Randy for CPAP supplies.    Vaccines  She would like to have both COVID and influenza vaccines. She is agreeable to influenza vaccine today.   Skin Irritation  She reports having two areas of irritated skin on her back. She states she has not been able to see the areas without a mirror but has not noticed a rash. She first noticed this after having surgery one month ago. Mostly bothers her when she is moving and when any fabric   HTN  She reports regular adherence to her antihypertensive medication including amlodipine 10 mg, Metoprolol 200 mg, Diovan 160-25 mg and spironolactone 25 mg.  She denies hypotension.  She is requesting refills for amlodipine.  She reports that she has a follow-up visit with her cardiologist coming up soon.   DM Patient reports adherence to anti-hyperglycemic medications without episodes of hypoglycemia. She continues to follow with endocrinology  for this problem .  Past Medical History:  Diagnosis Date   1st degree AV block    Arthritis    right shoulder blade, left thumb   Carotid arterial disease ([place])    a. 10/2020 U/S: RICA 33-82%, LICA 50-53%. Antegrade bilat vertebral flow.   Chronic venous insufficiency    CKD (chronic kidney disease), stage III (HCC)    Diabetes mellitus without complication (HCC)    GERD (gastroesophageal reflux disease)    Heart murmur    a. 12/2017 Echo: EF 50-55%, mild conc LVH, GrI DD, Trace MR/TR.   History of esophagitis    History of gastritis    History of stress test    a. 12/2017 MV: HTN response to exercise. EF 68%. Small, mild, reversible inferior apical defect-->Low risk.   HLD (hyperlipidemia)    Hypertension    Lymphedema    legs   Sleep apnea    a. Uses CPAP   Past Surgical History:  Procedure Laterality Date   BREAST CYST EXCISION Left    removed two times   [place]   COLONOSCOPY N/A 07/17/2021   Procedure: COLONOSCOPY;  Surgeon: [name], DO;  Location: [facility];  Service: Gastroenterology;  Laterality: N/A;  DM   CYST EXCISION     from left breast   ESOPHAGOGASTRODUODENOSCOPY (EGD) WITH PROPOFOL N/A 12/12/2015   gastritis, LA Grade A reflux esophagitis ESOPHAGOGASTRODUODENOSCOPY (EGD) WITH PROPOFOL;  Surgeon: Manya Silvas, MD;  Location: [place];  Service: Endoscopy;  Laterality: N/A;  Diabetic   HAMMER TOE SURGERY Bilateral 05/30/2016   Procedure: HAMMER TOE CORRECTION RIGHT 2ND, 3RD, 4TH, 5TH TOES LEFT 5TH TOE;  Surgeon: Samara Deist, DPM;  Location: [place];  Service: Podiatry;  Laterality: Bilateral;   HEEL SPUR SURGERY Right 2011   NASAL SINUS SURGERY     TONSILLECTOMY     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   Procedure: TOTAL HIP ARTHROPLASTY;  Surgeon: Dereck Leep, MD;  Location: [facility] ORS;  Service: Orthopedics;  Laterality: Left;   Social History   Socioeconomic History   Marital status: Single     Spouse name: Not on file   Number of children: 1   Years of education: master's   Highest education level: Not on file  Occupational History   Occupation: Training and development officer: [employer]  Tobacco Use   Smoking status: Never   Smokeless tobacco: Never  Vaping Use   Vaping Use: Never used  Substance and Sexual Activity   Alcohol use: Yes    Comment: occ   Drug use: No   Sexual activity: Not on file  Other Topics Concern   Not on file  Social History Narrative   Not on file   Social Determinants of Health   Financial Resource Strain: Not on file  Food Insecurity: Not on file  Transportation Needs: Not on file  Physical Activity: Not on file  Stress: Not on file  Social Connections: Not on file  Intimate Partner Violence: Not on file   Family Status  Relation Name Status   Mother  Deceased at age 43   Father  Deceased at age 65   Brother  Teller  Deceased   Pat Aunt  Deceased   MGM  Deceased   MGF  Deceased   Cindy Hester  Deceased   PGF  Deceased   Neg Hx  (Not Specified)   Family History  Problem Relation Age of Onset   Stroke Mother    Diabetes Mother    Hypertension Mother    Congestive Heart Failure Father    Hypertension Father    CAD Father    Glaucoma Father    Hyperlipidemia Brother    Lung disease Brother 69   Heart disease Brother    Diabetes Brother    Breast cancer Paternal Aunt    Breast cancer Paternal Aunt    Stomach cancer Neg Hx    Colon cancer Neg Hx    Allergies  Allergen Reactions   Tape     Most tapes cause redness.  Paper tape and tegaderm are OK.    Patient Care Team: Cindy Foster, MD as PCP - General (Family Medicine) Adrian Prows, MD as PCP - Cardiology (Cardiology)   Medications: Outpatient Medications Prior to Visit  Medication Sig   acetaminophen (TYLENOL) 500 MG tablet Take 1,000 mg by mouth daily as needed. Will alternate with 650 mg, 2 tabs daily when pain  increased   BIOTIN 5000 PO Take by mouth daily.   celecoxib (CELEBREX) 200 MG capsule Take 1 capsule (200 mg total) by mouth 2 (two) times daily.   cholecalciferol (VITAMIN D) 1000 UNITS tablet Take 5,000 Units by mouth.  Twice a week Mon, Fri   docusate sodium (COLACE) 100 MG capsule Take 100 mg by mouth 2 (two) times daily.   empagliflozin (JARDIANCE) 25 MG TABS tablet Take 25 mg by mouth daily.  EPINEPHrine 0.3 mg/0.3 mL IJ SOAJ injection INJ UTD   ezetimibe (ZETIA) 10 MG tablet TAKE 1 TABLET(10 MG) BY MOUTH DAILY AFTER SUPPER   furosemide (LASIX) 40 MG tablet TAKE 1/2 TABLET BY MOUTH EVERY DAY AS NEEDED   GLUCOSAMINE-CHONDROITIN PO Take 2,000 mg by mouth daily.   metoprolol (TOPROL-XL) 200 MG 24 hr tablet TAKE 1 TABLET(200 MG) BY MOUTH DAILY   montelukast (SINGULAIR) 10 MG tablet TAKE 1 TABLET BY MOUTH DAILY   Multiple Vitamin (MULTIVITAMIN) tablet Take 1 tablet by mouth daily.   NON FORMULARY CPAP   oxymetazoline (AFRIN) 0.05 % nasal spray Place 2 sprays into both nostrils at bedtime. Prior to CPAP   OZEMPIC, 0.25 OR 0.5 MG/DOSE, 2 MG/1.5ML SOPN INJ 0.5 MG [place] ONCE A WEEK   pantoprazole (PROTONIX) 40 MG tablet TAKE 1 TABLET(40 MG) BY MOUTH DAILY   rosuvastatin (CRESTOR) 20 MG tablet TAKE 1 TABLET(20 MG) BY MOUTH AT BEDTIME   spironolactone (ALDACTONE) 25 MG tablet TAKE 1 TABLET BY MOUTH EVERY DAY   valACYclovir (VALTREX) 500 MG tablet TAKE 1 TABLET BY MOUTH TWICE DAILY IF NEEDED   valsartan-hydrochlorothiazide (DIOVAN-HCT) 160-25 MG tablet TAKE 1 TABLET BY MOUTH EVERY MORNING   vitamin B-12 (CYANOCOBALAMIN) 1000 MCG tablet Take 2,500 mcg by mouth 2 (two) times a week. Mon, Fri   [DISCONTINUED] amLODipine (NORVASC) 10 MG tablet TAKE 1 TABLET(10 MG) BY MOUTH DAILY   enoxaparin (LOVENOX) 40 MG/0.4ML injection Inject 0.4 mLs (40 mg total) into the skin daily for 14 days.   lactulose (CHRONULAC) 10 GM/15ML solution SMARTSIG:20 Milliliter(s) By Mouth Twice Daily (Patient not taking: Reported  on 10/27/2021)   [DISCONTINUED] azelastine (OPTIVAR) 0.05 % ophthalmic solution Place 1 drop into both eyes as needed. (Patient not taking: Reported on 10/27/2021)   [DISCONTINUED] Azelastine HCl 137 MCG/SPRAY SOLN Place 1-2 sprays into the nose 2 (two) times daily. (Patient not taking: Reported on 10/27/2021)   [DISCONTINUED] mometasone (NASONEX) 50 MCG/ACT nasal spray Place 1-2 sprays into the nose daily. Each nostril (Patient not taking: Reported on 10/27/2021)   [DISCONTINUED] oxyCODONE (OXY IR/ROXICODONE) 5 MG immediate release tablet Take 1 tablet (5 mg total) by mouth every 4 (four) hours as needed for moderate pain (pain score 4-6). (Patient not taking: Reported on 10/27/2021)   [DISCONTINUED] traMADol (ULTRAM) 50 MG tablet Take 1 tablet (50 mg total) by mouth every 4 (four) hours as needed for moderate pain. (Patient not taking: Reported on 10/27/2021)   No facility-administered medications prior to visit.    Review of Systems  Musculoskeletal:  Positive for gait problem.  All other systems reviewed and are negative.     Objective     BP 111/68 (BP Location: Left Arm, Patient Position: Sitting, Cuff Size: Normal)   Pulse 76   Temp 98 F (36.7 C) (Oral)   Resp 16   Ht 5' 6.5" (1.689 m)   Wt 163 lb 11.2 oz (74.3 kg)   BMI 26.03 kg/m     Physical Exam Constitutional:      General: She is not in acute distress.    Appearance: Normal appearance. She is not ill-appearing, toxic-appearing or diaphoretic.  HENT:     Right Ear: External ear normal.     Left Ear: External ear normal.     Nose: Nose normal.     Mouth/Throat:     Pharynx: Oropharynx is clear.  Eyes:     General: No scleral icterus.    Extraocular Movements: Extraocular movements intact.  Conjunctiva/sclera: Conjunctivae normal.     Pupils: Pupils are equal, round, and reactive to light.  Cardiovascular:     Rate and Rhythm: Normal rate and regular rhythm.     Pulses: Normal pulses.     Heart sounds: Normal  heart sounds. No murmur heard.    No friction rub. No gallop.  Pulmonary:     Effort: Pulmonary effort is normal. No respiratory distress.     Breath sounds: Normal breath sounds. No wheezing, rhonchi or rales.  Abdominal:     General: Bowel sounds are normal. There is no distension.     Palpations: Abdomen is soft. There is no mass.     Tenderness: There is no abdominal tenderness. There is no guarding.  Musculoskeletal:        General: No deformity.     Cervical back: Normal range of motion and neck supple. No rigidity or tenderness.     Right lower leg: Edema present.     Left lower leg: Edema present.     Comments: Patient has postsurgical deformity of right ankle  She has postsurgical changes on bilateral toes  Lymphadenopathy:     Cervical: No cervical adenopathy.  Skin:    General: Skin is warm and dry.     Capillary Refill: Capillary refill takes less than 2 seconds.     Findings: No erythema or rash.     Comments: Examined patient's back and did not note any erythema or appearance of rash or lesions Patient noted to have dry skin in areas where she is having irritation  Neurological:     General: No focal deficit present.     Mental Status: She is alert and oriented to person, place, and time.     Motor: No weakness.     Gait: Gait normal.  Psychiatric:        Mood and Affect: Mood normal.        Behavior: Behavior normal.       Last depression screening scores    10/27/2021    8:43 AM 03/20/2021    3:25 PM 09/28/2020    9:30 AM  PHQ 2/9 Scores  PHQ - 2 Score 0 1 0  PHQ- 9 Score  3 0   Last fall risk screening    10/27/2021    8:41 AM  Fall Risk   Falls in the past year? 1  Number falls in past yr: 1  Injury with Fall? 1  Risk for fall due to : Other (Comment)  Follow up Falls evaluation completed   Last Audit-C alcohol use screening    03/20/2021    3:25 PM  Alcohol Use Disorder Test (AUDIT)  1. How often do you have a drink containing alcohol? 1   2. How many drinks containing alcohol do you have on a typical day when you are drinking? 0  3. How often do you have six or more drinks on one occasion? 0  AUDIT-C Score 1   A score of 3 or more in women, and 4 or more in men indicates increased risk for alcohol abuse, EXCEPT if all of the points are from question 1   No results found for any visits on 10/27/21.  Assessment & Plan    Routine Health Maintenance and Physical Exam  Exercise Activities and Dietary recommendations  Goals   None     Immunization History  Administered Date(s) Administered   Fluad Quad(high Dose 65+) 01/18/2021, 10/27/2021   Influenza, High Dose  Seasonal PF 10/31/2016, 11/04/2017   Influenza-Unspecified 10/07/2014, 11/13/2018   Moderna Sars-Covid-2 Vaccination 03/23/2019, 04/17/2019, 12/17/2019   Pneumococcal Conjugate-13 04/13/2014   Pneumococcal Polysaccharide-23 11/18/2012   Tdap 07/19/2009, 09/28/2020   Zoster Recombinat (Shingrix) 09/28/2020, 03/01/2021   Zoster, Live 05/26/2009    Health Maintenance  Topic Date Due   COVID-19 Vaccine (4 - Moderna risk series) 02/11/2020   Diabetic kidney evaluation - Urine ACR  10/19/2021   OPHTHALMOLOGY EXAM  11/09/2021   HEMOGLOBIN A1C  03/23/2022   Diabetic kidney evaluation - GFR measurement  09/30/2022   FOOT EXAM  10/28/2022   MAMMOGRAM  07/08/2023   TETANUS/TDAP  09/29/2030   COLONOSCOPY (Pts 45-61yr Insurance coverage will need to be confirmed)  07/18/2031   Pneumonia Vaccine 76 Years old  Completed   INFLUENZA VACCINE  Completed   DEXA SCAN  Completed   Hepatitis C Screening  Completed   Zoster Vaccines- Shingrix  Completed   HPV VACCINES  Aged Out    Discussed health benefits of physical activity, and encouraged her to engage in regular exercise appropriate for her age and condition.  Problem List Items Addressed This Visit       Cardiovascular and Mediastinum   Essential hypertension, benign    Chronic, stable Blood pressure  within normal limits She will continue current medications CMP collected today       Relevant Medications   amLODipine (NORVASC) 10 MG tablet   Hypertension associated with diabetes ([place]    Chronic, controlled Patient will continue current medications Follows with cardiology at upcoming appointment We will collect CMP today       Relevant Medications   amLODipine (NORVASC) 10 MG tablet   Other Relevant Orders   Comprehensive metabolic panel   Microalbumin / creatinine urine ratio     Respiratory   Obstructive apnea    Chronic, stable Patient is requesting a new CPAP machine She states her current one is almost 74years old Order placed for CPAP with automated titration and appropriate supplies      Relevant Orders   For home use only DME continuous positive airway pressure (CPAP)     Endocrine   Adult hypothyroidism    Chronic, controlled We will check TSH and free T4 today No current medications for this problem       Relevant Orders   TSH + free T4   Type 2 diabetes mellitus without complication, without long-term current use of insulin (HCC)    Hemoglobin A1c is well controlled Patient will continue Jardiance 25 mg daily, Ozempic 0.5 mg weekly We will collect urine microalbumin creatinine ratio Patient to follow-up in 6 months        Other   HLD (hyperlipidemia)    Chronic, stable We will check lipid panel today Patient will continue rosuvastatin 20 mg daily      Relevant Medications   amLODipine (NORVASC) 10 MG tablet   Other Relevant Orders   Lipid panel   Avitaminosis D    Chronic, stable She will continue supplementation with MV  We will check vitamin D levels today       Relevant Orders   VITAMIN D 25 Hydroxy (Vit-D Deficiency, Fractures)   Skin irritation    Patient does not appear to have rash on skin exam today Recommended moisturizing cream as well as moisturizing body wash       Other Visit Diagnoses     Need for influenza  vaccination    -  Primary  Relevant Orders   Flu Vaccine QUAD High Dose(Fluad) (Completed)        Fu in 6 months    I, Cindy Foster, MD, have reviewed all documentation for this visit. The documentation on 10/27/21 for the exam, diagnosis, procedures, and orders are all accurate and complete.    Cindy Foster, MD  [facility] [phone] (phone) [phone] (fax)  [place]

## 2021-10-27 NOTE — Assessment & Plan Note (Signed)
Chronic, stable She will continue supplementation with MV  We will check vitamin D levels today

## 2021-10-27 NOTE — Patient Instructions (Signed)
It was a pleasure to see you today!  Thank you for choosing [facility] for your primary care.   Cindy Hester was seen for annual physical.   Our plans for today were: We will collect blood work today to check liver/kidney function as well as thyroid studies and cholesterol as well as vitamin D  We will collect a urine sample to check kidney function as well For the area on your back, I recommend using an exfoliating cloth with moisturizing body wash and applying moisturizing cream (like Eucerin) to your back using the long back cloth.    You should return to our clinic in 6 months for follow up on diabetes and blood pressure.   Best Wishes,   Dr. Quentin Cornwall

## 2021-10-27 NOTE — Assessment & Plan Note (Addendum)
Chronic, stable Blood pressure within normal limits She will continue current medications CMP collected today

## 2021-10-27 NOTE — Assessment & Plan Note (Signed)
Chronic, controlled Patient will continue current medications Follows with cardiology at upcoming appointment We will collect CMP today

## 2021-10-27 NOTE — Assessment & Plan Note (Signed)
Chronic, stable Patient is requesting a new CPAP machine She states her current one is almost 76 years old Order placed for CPAP with automated titration and appropriate supplies

## 2021-10-27 NOTE — Assessment & Plan Note (Signed)
Chronic, controlled We will check TSH and free T4 today No current medications for this problem

## 2021-10-27 NOTE — Assessment & Plan Note (Signed)
Chronic, stable We will check lipid panel today Patient will continue rosuvastatin 20 mg daily

## 2021-10-27 NOTE — Assessment & Plan Note (Addendum)
Hemoglobin A1c is well controlled Patient will continue Jardiance 25 mg daily, Ozempic 0.5 mg weekly We will collect urine microalbumin creatinine ratio Patient to follow-up in 6 months

## 2021-10-27 NOTE — Assessment & Plan Note (Signed)
Patient does not appear to have rash on skin exam today Recommended moisturizing cream as well as moisturizing body wash

## 2021-10-29 LAB — COMPREHENSIVE METABOLIC PANEL
ALT: 22 IU/L (ref 0–32)
AST: 20 IU/L (ref 0–40)
Albumin/Globulin Ratio: 2.3 — ABNORMAL HIGH (ref 1.2–2.2)
Albumin: 4.6 g/dL (ref 3.8–4.8)
Alkaline Phosphatase: 77 IU/L (ref 44–121)
BUN/Creatinine Ratio: 25 (ref 12–28)
BUN: 25 mg/dL (ref 8–27)
Bilirubin Total: 0.3 mg/dL (ref 0.0–1.2)
CO2: 21 mmol/L (ref 20–29)
Calcium: 9.5 mg/dL (ref 8.7–10.3)
Chloride: 98 mmol/L (ref 96–106)
Creatinine, Ser: 0.99 mg/dL (ref 0.57–1.00)
Globulin, Total: 2 g/dL (ref 1.5–4.5)
Glucose: 107 mg/dL — ABNORMAL HIGH (ref 70–99)
Potassium: 4.4 mmol/L (ref 3.5–5.2)
Sodium: 135 mmol/L (ref 134–144)
Total Protein: 6.6 g/dL (ref 6.0–8.5)
eGFR: 60 mL/min/{1.73_m2} (ref >59)

## 2021-10-29 LAB — MICROALBUMIN / CREATININE URINE RATIO
Creatinine, Urine: 121.9 mg/dL
Microalb/Creat Ratio: 6 mg/g creat (ref 0–29)
Microalbumin, Urine: 7.8 ug/mL

## 2021-10-29 LAB — TSH+FREE T4
Free T4: 1.26 ng/dL (ref 0.82–1.77)
TSH: 1.03 u[IU]/mL (ref 0.450–4.500)

## 2021-10-29 LAB — VITAMIN D 25 HYDROXY (VIT D DEFICIENCY, FRACTURES): Vit D, 25-Hydroxy: 55.8 ng/mL (ref 30.0–100.0)

## 2021-10-29 LAB — LIPID PANEL
Chol/HDL Ratio: 2.5 ratio (ref 0.0–4.4)
Cholesterol, Total: 147 mg/dL (ref 100–199)
HDL: 58 mg/dL (ref >39)
LDL Chol Calc (NIH): 69 mg/dL (ref 0–99)
Triglycerides: 114 mg/dL (ref 0–149)
VLDL Cholesterol Cal: 20 mg/dL (ref 5–40)

## 2021-10-31 DIAGNOSIS — Z6826 Body mass index (BMI) 26.0-26.9, adult: Secondary | ICD-10-CM | POA: Diagnosis not present

## 2021-10-31 DIAGNOSIS — G4733 Obstructive sleep apnea (adult) (pediatric): Secondary | ICD-10-CM | POA: Diagnosis not present

## 2021-10-31 DIAGNOSIS — Z96641 Presence of right artificial hip joint: Secondary | ICD-10-CM | POA: Diagnosis not present

## 2021-10-31 DIAGNOSIS — E039 Hypothyroidism, unspecified: Secondary | ICD-10-CM | POA: Diagnosis not present

## 2021-10-31 DIAGNOSIS — J301 Allergic rhinitis due to pollen: Secondary | ICD-10-CM | POA: Diagnosis not present

## 2021-10-31 DIAGNOSIS — Z471 Aftercare following joint replacement surgery: Secondary | ICD-10-CM | POA: Diagnosis not present

## 2021-10-31 DIAGNOSIS — E669 Obesity, unspecified: Secondary | ICD-10-CM | POA: Diagnosis not present

## 2021-10-31 DIAGNOSIS — E7800 Pure hypercholesterolemia, unspecified: Secondary | ICD-10-CM | POA: Diagnosis not present

## 2021-10-31 DIAGNOSIS — I890 Lymphedema, not elsewhere classified: Secondary | ICD-10-CM | POA: Diagnosis not present

## 2021-10-31 DIAGNOSIS — N1832 Chronic kidney disease, stage 3b: Secondary | ICD-10-CM | POA: Diagnosis not present

## 2021-10-31 DIAGNOSIS — I872 Venous insufficiency (chronic) (peripheral): Secondary | ICD-10-CM | POA: Diagnosis not present

## 2021-10-31 DIAGNOSIS — I129 Hypertensive chronic kidney disease with stage 1 through stage 4 chronic kidney disease, or unspecified chronic kidney disease: Secondary | ICD-10-CM | POA: Diagnosis not present

## 2021-10-31 DIAGNOSIS — K219 Gastro-esophageal reflux disease without esophagitis: Secondary | ICD-10-CM | POA: Diagnosis not present

## 2021-10-31 DIAGNOSIS — J3089 Other allergic rhinitis: Secondary | ICD-10-CM | POA: Diagnosis not present

## 2021-10-31 DIAGNOSIS — E1122 Type 2 diabetes mellitus with diabetic chronic kidney disease: Secondary | ICD-10-CM | POA: Diagnosis not present

## 2021-10-31 DIAGNOSIS — A6000 Herpesviral infection of urogenital system, unspecified: Secondary | ICD-10-CM | POA: Diagnosis not present

## 2021-10-31 DIAGNOSIS — E559 Vitamin D deficiency, unspecified: Secondary | ICD-10-CM | POA: Diagnosis not present

## 2021-11-01 DIAGNOSIS — E559 Vitamin D deficiency, unspecified: Secondary | ICD-10-CM | POA: Diagnosis not present

## 2021-11-01 DIAGNOSIS — E119 Type 2 diabetes mellitus without complications: Secondary | ICD-10-CM | POA: Diagnosis not present

## 2021-11-01 DIAGNOSIS — E1169 Type 2 diabetes mellitus with other specified complication: Secondary | ICD-10-CM | POA: Diagnosis not present

## 2021-11-01 DIAGNOSIS — E1159 Type 2 diabetes mellitus with other circulatory complications: Secondary | ICD-10-CM | POA: Diagnosis not present

## 2021-11-06 ENCOUNTER — Telehealth: Payer: Self-pay | Admitting: Family Medicine

## 2021-11-06 DIAGNOSIS — N1832 Chronic kidney disease, stage 3b: Secondary | ICD-10-CM | POA: Diagnosis not present

## 2021-11-06 DIAGNOSIS — E039 Hypothyroidism, unspecified: Secondary | ICD-10-CM | POA: Diagnosis not present

## 2021-11-06 DIAGNOSIS — Z96641 Presence of right artificial hip joint: Secondary | ICD-10-CM | POA: Diagnosis not present

## 2021-11-06 DIAGNOSIS — Z471 Aftercare following joint replacement surgery: Secondary | ICD-10-CM | POA: Diagnosis not present

## 2021-11-06 DIAGNOSIS — K219 Gastro-esophageal reflux disease without esophagitis: Secondary | ICD-10-CM | POA: Diagnosis not present

## 2021-11-06 DIAGNOSIS — E7800 Pure hypercholesterolemia, unspecified: Secondary | ICD-10-CM | POA: Diagnosis not present

## 2021-11-06 DIAGNOSIS — Z6826 Body mass index (BMI) 26.0-26.9, adult: Secondary | ICD-10-CM | POA: Diagnosis not present

## 2021-11-06 DIAGNOSIS — E669 Obesity, unspecified: Secondary | ICD-10-CM | POA: Diagnosis not present

## 2021-11-06 DIAGNOSIS — E559 Vitamin D deficiency, unspecified: Secondary | ICD-10-CM | POA: Diagnosis not present

## 2021-11-06 DIAGNOSIS — E1122 Type 2 diabetes mellitus with diabetic chronic kidney disease: Secondary | ICD-10-CM | POA: Diagnosis not present

## 2021-11-06 DIAGNOSIS — A6000 Herpesviral infection of urogenital system, unspecified: Secondary | ICD-10-CM | POA: Diagnosis not present

## 2021-11-06 DIAGNOSIS — I890 Lymphedema, not elsewhere classified: Secondary | ICD-10-CM | POA: Diagnosis not present

## 2021-11-06 DIAGNOSIS — J301 Allergic rhinitis due to pollen: Secondary | ICD-10-CM | POA: Diagnosis not present

## 2021-11-06 DIAGNOSIS — I872 Venous insufficiency (chronic) (peripheral): Secondary | ICD-10-CM | POA: Diagnosis not present

## 2021-11-06 DIAGNOSIS — G4733 Obstructive sleep apnea (adult) (pediatric): Secondary | ICD-10-CM | POA: Diagnosis not present

## 2021-11-06 DIAGNOSIS — I129 Hypertensive chronic kidney disease with stage 1 through stage 4 chronic kidney disease, or unspecified chronic kidney disease: Secondary | ICD-10-CM | POA: Diagnosis not present

## 2021-11-06 NOTE — Telephone Encounter (Signed)
Cpap machine order placed through [facility].

## 2021-11-06 NOTE — Telephone Encounter (Signed)
Pt fu on 9-22 conversation w/ provider regarding cpap machine  Pt requesting rx for Resmed 11 cpap machine  Sent to:  [facility] [address] [place], [place]

## 2021-11-08 ENCOUNTER — Ambulatory Visit: Payer: BC Managed Care – PPO

## 2021-11-08 DIAGNOSIS — I6523 Occlusion and stenosis of bilateral carotid arteries: Secondary | ICD-10-CM

## 2021-11-09 DIAGNOSIS — Z96642 Presence of left artificial hip joint: Secondary | ICD-10-CM | POA: Diagnosis not present

## 2021-11-09 DIAGNOSIS — J301 Allergic rhinitis due to pollen: Secondary | ICD-10-CM | POA: Diagnosis not present

## 2021-11-09 DIAGNOSIS — J3089 Other allergic rhinitis: Secondary | ICD-10-CM | POA: Diagnosis not present

## 2021-11-13 ENCOUNTER — Ambulatory Visit: Payer: BC Managed Care – PPO | Admitting: Cardiology

## 2021-11-13 ENCOUNTER — Encounter: Payer: Self-pay | Admitting: Cardiology

## 2021-11-13 VITALS — BP 115/44 | HR 72 | Temp 97.9°F | Resp 16 | Ht 66.5 in | Wt 168.6 lb

## 2021-11-13 DIAGNOSIS — E1122 Type 2 diabetes mellitus with diabetic chronic kidney disease: Secondary | ICD-10-CM | POA: Diagnosis not present

## 2021-11-13 DIAGNOSIS — E782 Mixed hyperlipidemia: Secondary | ICD-10-CM | POA: Diagnosis not present

## 2021-11-13 DIAGNOSIS — J301 Allergic rhinitis due to pollen: Secondary | ICD-10-CM | POA: Diagnosis not present

## 2021-11-13 DIAGNOSIS — E039 Hypothyroidism, unspecified: Secondary | ICD-10-CM | POA: Diagnosis not present

## 2021-11-13 DIAGNOSIS — I700 Atherosclerosis of aorta: Secondary | ICD-10-CM | POA: Diagnosis not present

## 2021-11-13 DIAGNOSIS — Z96641 Presence of right artificial hip joint: Secondary | ICD-10-CM | POA: Diagnosis not present

## 2021-11-13 DIAGNOSIS — K219 Gastro-esophageal reflux disease without esophagitis: Secondary | ICD-10-CM | POA: Diagnosis not present

## 2021-11-13 DIAGNOSIS — E669 Obesity, unspecified: Secondary | ICD-10-CM | POA: Diagnosis not present

## 2021-11-13 DIAGNOSIS — I10 Essential (primary) hypertension: Secondary | ICD-10-CM

## 2021-11-13 DIAGNOSIS — I890 Lymphedema, not elsewhere classified: Secondary | ICD-10-CM | POA: Diagnosis not present

## 2021-11-13 DIAGNOSIS — Z6826 Body mass index (BMI) 26.0-26.9, adult: Secondary | ICD-10-CM | POA: Diagnosis not present

## 2021-11-13 DIAGNOSIS — I129 Hypertensive chronic kidney disease with stage 1 through stage 4 chronic kidney disease, or unspecified chronic kidney disease: Secondary | ICD-10-CM | POA: Diagnosis not present

## 2021-11-13 DIAGNOSIS — I872 Venous insufficiency (chronic) (peripheral): Secondary | ICD-10-CM | POA: Diagnosis not present

## 2021-11-13 DIAGNOSIS — E559 Vitamin D deficiency, unspecified: Secondary | ICD-10-CM | POA: Diagnosis not present

## 2021-11-13 DIAGNOSIS — A6000 Herpesviral infection of urogenital system, unspecified: Secondary | ICD-10-CM | POA: Diagnosis not present

## 2021-11-13 DIAGNOSIS — N1832 Chronic kidney disease, stage 3b: Secondary | ICD-10-CM | POA: Diagnosis not present

## 2021-11-13 DIAGNOSIS — Z471 Aftercare following joint replacement surgery: Secondary | ICD-10-CM | POA: Diagnosis not present

## 2021-11-13 DIAGNOSIS — G4733 Obstructive sleep apnea (adult) (pediatric): Secondary | ICD-10-CM | POA: Diagnosis not present

## 2021-11-13 DIAGNOSIS — E7800 Pure hypercholesterolemia, unspecified: Secondary | ICD-10-CM | POA: Diagnosis not present
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
## 2021-11-13 NOTE — Progress Notes (Signed)
Office visit today

## 2021-11-14 DIAGNOSIS — J3081 Allergic rhinitis due to animal (cat) (dog) hair and dander: Secondary | ICD-10-CM | POA: Diagnosis not present

## 2021-11-14 DIAGNOSIS — H524 Presbyopia: Secondary | ICD-10-CM | POA: Diagnosis not present

## 2021-11-14 DIAGNOSIS — J301 Allergic rhinitis due to pollen: Secondary | ICD-10-CM | POA: Diagnosis not present

## 2021-11-14 DIAGNOSIS — H2512 Age-related nuclear cataract, left eye: Secondary | ICD-10-CM | POA: Diagnosis not present

## 2021-11-14 DIAGNOSIS — J3089 Other allergic rhinitis: Secondary | ICD-10-CM | POA: Diagnosis not present

## 2021-11-14 LAB — HM DIABETES EYE EXAM

## 2021-11-16 ENCOUNTER — Encounter: Payer: Self-pay | Admitting: Family Medicine

## 2021-11-24 DIAGNOSIS — E7800 Pure hypercholesterolemia, unspecified: Secondary | ICD-10-CM | POA: Diagnosis not present

## 2021-11-24 DIAGNOSIS — Z6826 Body mass index (BMI) 26.0-26.9, adult: Secondary | ICD-10-CM | POA: Diagnosis not present

## 2021-11-24 DIAGNOSIS — I129 Hypertensive chronic kidney disease with stage 1 through stage 4 chronic kidney disease, or unspecified chronic kidney disease: Secondary | ICD-10-CM | POA: Diagnosis not present

## 2021-11-24 DIAGNOSIS — E1122 Type 2 diabetes mellitus with diabetic chronic kidney disease: Secondary | ICD-10-CM | POA: Diagnosis not present

## 2021-11-24 DIAGNOSIS — Z471 Aftercare following joint replacement surgery: Secondary | ICD-10-CM | POA: Diagnosis not present

## 2021-11-24 DIAGNOSIS — I890 Lymphedema, not elsewhere classified: Secondary | ICD-10-CM | POA: Diagnosis not present

## 2021-11-24 DIAGNOSIS — K219 Gastro-esophageal reflux disease without esophagitis: Secondary | ICD-10-CM | POA: Diagnosis not present

## 2021-11-24 DIAGNOSIS — J3089 Other allergic rhinitis: Secondary | ICD-10-CM | POA: Diagnosis not present

## 2021-11-24 DIAGNOSIS — G4733 Obstructive sleep apnea (adult) (pediatric): Secondary | ICD-10-CM | POA: Diagnosis not present

## 2021-11-24 DIAGNOSIS — E559 Vitamin D deficiency, unspecified: Secondary | ICD-10-CM | POA: Diagnosis not present

## 2021-11-24 DIAGNOSIS — E669 Obesity, unspecified: Secondary | ICD-10-CM | POA: Diagnosis not present

## 2021-11-24 DIAGNOSIS — I872 Venous insufficiency (chronic) (peripheral): Secondary | ICD-10-CM | POA: Diagnosis not present

## 2021-11-24 DIAGNOSIS — J301 Allergic rhinitis due to pollen: Secondary | ICD-10-CM | POA: Diagnosis not present

## 2021-11-24 DIAGNOSIS — E039 Hypothyroidism, unspecified: Secondary | ICD-10-CM | POA: Diagnosis not present

## 2021-11-24 DIAGNOSIS — Z96641 Presence of right artificial hip joint: Secondary | ICD-10-CM | POA: Diagnosis not present

## 2021-11-24 DIAGNOSIS — N1832 Chronic kidney disease, stage 3b: Secondary | ICD-10-CM | POA: Diagnosis not present

## 2021-11-24 DIAGNOSIS — A6000 Herpesviral infection of urogenital system, unspecified: Secondary | ICD-10-CM | POA: Diagnosis not present

## 2021-11-28 DIAGNOSIS — E559 Vitamin D deficiency, unspecified: Secondary | ICD-10-CM | POA: Diagnosis not present

## 2021-11-28 DIAGNOSIS — I872 Venous insufficiency (chronic) (peripheral): Secondary | ICD-10-CM | POA: Diagnosis not present

## 2021-11-28 DIAGNOSIS — Z471 Aftercare following joint replacement surgery: Secondary | ICD-10-CM | POA: Diagnosis not present

## 2021-11-28 DIAGNOSIS — Z96641 Presence of right artificial hip joint: Secondary | ICD-10-CM | POA: Diagnosis not present

## 2021-11-28 DIAGNOSIS — I890 Lymphedema, not elsewhere classified: Secondary | ICD-10-CM | POA: Diagnosis not present

## 2021-11-28 DIAGNOSIS — E7800 Pure hypercholesterolemia, unspecified: Secondary | ICD-10-CM | POA: Diagnosis not present

## 2021-11-28 DIAGNOSIS — G4733 Obstructive sleep apnea (adult) (pediatric): Secondary | ICD-10-CM | POA: Diagnosis not present

## 2021-11-28 DIAGNOSIS — E669 Obesity, unspecified: Secondary | ICD-10-CM | POA: Diagnosis not present

## 2021-11-28 DIAGNOSIS — E039 Hypothyroidism, unspecified: Secondary | ICD-10-CM | POA: Diagnosis not present

## 2021-11-28 DIAGNOSIS — K219 Gastro-esophageal reflux disease without esophagitis: Secondary | ICD-10-CM | POA: Diagnosis not present

## 2021-11-28 DIAGNOSIS — N1832 Chronic kidney disease, stage 3b: Secondary | ICD-10-CM | POA: Diagnosis not present

## 2021-11-28 DIAGNOSIS — Z6826 Body mass index (BMI) 26.0-26.9, adult: Secondary | ICD-10-CM | POA: Diagnosis not present

## 2021-11-28 DIAGNOSIS — J301 Allergic rhinitis due to pollen: Secondary | ICD-10-CM | POA: Diagnosis not present

## 2021-11-28 DIAGNOSIS — E1122 Type 2 diabetes mellitus with diabetic chronic kidney disease: Secondary | ICD-10-CM | POA: Diagnosis not present

## 2021-11-28 DIAGNOSIS — A6000 Herpesviral infection of urogenital system, unspecified: Secondary | ICD-10-CM | POA: Diagnosis not present

## 2021-11-28 DIAGNOSIS — I129 Hypertensive chronic kidney disease with stage 1 through stage 4 chronic kidney disease, or unspecified chronic kidney disease: Secondary | ICD-10-CM | POA: Diagnosis not present

## 2021-12-01 DIAGNOSIS — J3089 Other allergic rhinitis: Secondary | ICD-10-CM | POA: Diagnosis not present

## 2021-12-08 ENCOUNTER — Other Ambulatory Visit: Payer: Self-pay | Admitting: Family Medicine

## 2021-12-08 DIAGNOSIS — E782 Mixed hyperlipidemia: Secondary | ICD-10-CM

## 2021-12-08 NOTE — Telephone Encounter (Signed)
Requested medication (s) are due for refill today: yes  Requested medication (s) are on the active medication list: yes  Last refill:  07/29/2020 #90 with 3 RF  Future visit scheduled: 04/27/22, seen 10/27/21  Notes to clinic:  prescriber not in this practice, please assess.      Requested Prescriptions  Pending Prescriptions Disp Refills   ezetimibe (ZETIA) 10 MG tablet 90 tablet 3     There is no refill protocol information for this order

## 2021-12-08 NOTE — Telephone Encounter (Signed)
Medication Refill - Medication: ezetimibe (ZETIA) 10 MG tablet   Has the patient contacted their pharmacy? Yes.   (They told her to call the office  Preferred Pharmacy (with phone number or street name): [facility] [place], [place]  Has the patient been seen for an appointment in the last year OR does the patient have an upcoming appointment? Yes.    Agent: Please be advised that RX refills may take up to 3 business days. We ask that you follow-up with your pharmacy.

## 2021-12-11 MED ORDER — EZETIMIBE 10 MG PO TABS: ORAL_TABLET | ORAL | 3 refills | Status: AC

## 2021-12-14 DIAGNOSIS — G4733 Obstructive sleep apnea (adult) (pediatric): Secondary | ICD-10-CM | POA: Diagnosis not present

## 2021-12-19 DIAGNOSIS — J3089 Other allergic rhinitis: Secondary | ICD-10-CM | POA: Diagnosis not present

## 2021-12-19 DIAGNOSIS — Z96642 Presence of left artificial hip joint: Secondary | ICD-10-CM | POA: Diagnosis not present

## 2021-12-19 DIAGNOSIS — J3081 Allergic rhinitis due to animal (cat) (dog) hair and dander: Secondary | ICD-10-CM | POA: Diagnosis not present

## 2021-12-19 DIAGNOSIS — J301 Allergic rhinitis due to pollen: Secondary | ICD-10-CM | POA: Diagnosis not present

## 2021-12-21 DIAGNOSIS — M1711 Unilateral primary osteoarthritis, right knee: Secondary | ICD-10-CM | POA: Diagnosis not present

## 2022-01-05 DIAGNOSIS — J3089 Other allergic rhinitis: Secondary | ICD-10-CM | POA: Diagnosis not present

## 2022-01-05 DIAGNOSIS — J301 Allergic rhinitis due to pollen: Secondary | ICD-10-CM | POA: Diagnosis not present

## 2022-01-09 DIAGNOSIS — J301 Allergic rhinitis due to pollen: Secondary | ICD-10-CM | POA: Diagnosis not present

## 2022-01-09 DIAGNOSIS — J3089 Other allergic rhinitis: Secondary | ICD-10-CM | POA: Diagnosis not present

## 2022-01-11 DIAGNOSIS — M1712 Unilateral primary osteoarthritis, left knee: Secondary | ICD-10-CM | POA: Diagnosis not present

## 2022-01-13 DIAGNOSIS — G4733 Obstructive sleep apnea (adult) (pediatric): Secondary | ICD-10-CM | POA: Diagnosis not present

## 2022-01-16 ENCOUNTER — Other Ambulatory Visit: Payer: Self-pay | Admitting: Obstetrics and Gynecology

## 2022-01-16 DIAGNOSIS — Z01419 Encounter for gynecological examination (general) (routine) without abnormal findings: Secondary | ICD-10-CM | POA: Diagnosis not present

## 2022-01-16 DIAGNOSIS — H1045 Other chronic allergic conjunctivitis: Secondary | ICD-10-CM | POA: Diagnosis not present

## 2022-01-16 DIAGNOSIS — Z124 Encounter for screening for malignant neoplasm of cervix: Secondary | ICD-10-CM | POA: Diagnosis not present

## 2022-01-16 DIAGNOSIS — Z1231 Encounter for screening mammogram for malignant neoplasm of breast: Secondary | ICD-10-CM

## 2022-01-16 DIAGNOSIS — J301 Allergic rhinitis due to pollen: Secondary | ICD-10-CM | POA: Diagnosis not present

## 2022-01-23 DIAGNOSIS — J3089 Other allergic rhinitis: Secondary | ICD-10-CM | POA: Diagnosis not present

## 2022-01-23 DIAGNOSIS — J301 Allergic rhinitis due to pollen: Secondary | ICD-10-CM | POA: Diagnosis not present

## 2022-01-23 DIAGNOSIS — J3081 Allergic rhinitis due to animal (cat) (dog) hair and dander: Secondary | ICD-10-CM | POA: Diagnosis not present

## 2022-02-02 DIAGNOSIS — J3089 Other allergic rhinitis: Secondary | ICD-10-CM | POA: Diagnosis not present

## 2022-02-02 DIAGNOSIS — J301 Allergic rhinitis due to pollen: Secondary | ICD-10-CM | POA: Diagnosis not present

## 2022-02-07 ENCOUNTER — Ambulatory Visit (INDEPENDENT_AMBULATORY_CARE_PROVIDER_SITE_OTHER): Payer: BC Managed Care – PPO | Admitting: Dermatology

## 2022-02-07 DIAGNOSIS — L988 Other specified disorders of the skin and subcutaneous tissue: Secondary | ICD-10-CM

## 2022-02-07 NOTE — Patient Instructions (Signed)
Due to recent changes in healthcare laws, you may see results of your pathology and/or laboratory studies on MyChart before the doctors have had a chance to review them. We understand that in some cases there may be results that are confusing or concerning to you. Please understand that not all results are received at the same time and often the doctors may need to interpret multiple results in order to provide you with the best plan of care or course of treatment. Therefore, we ask that you please give us 2 business days to thoroughly review all your results before contacting the office for clarification. Should we see a critical lab result, you will be contacted sooner.   If You Need Anything After Your Visit  If you have any questions or concerns for your doctor, please call our main line at [phone] and press option 4 to reach your doctor's medical assistant. If no one answers, please leave a voicemail as directed and we will return your call as soon as possible. Messages left after 4 pm will be answered the following business day.   You may also send us a message via MyChart. We typically respond to MyChart messages within 1-2 business days.  For prescription refills, please ask your pharmacy to contact our office. Our fax number is [phone].  If you have an urgent issue when the clinic is closed that cannot wait until the next business day, you can page your doctor at the number below.    Please note that while we do our best to be available for urgent issues outside of office hours, we are not available 24/7.   If you have an urgent issue and are unable to reach us, you may choose to seek medical care at your doctor's office, retail clinic, urgent care center, or emergency room.  If you have a medical emergency, please immediately call 911 or go to the emergency department.  Pager Numbers  - Dr. Kowalski: [phone]  - Dr. Moye: [phone]  - Dr. Stewart:  [phone]  In the event of inclement weather, please call our main line at [phone] for an update on the status of any delays or closures.  Dermatology Medication Tips: Please keep the boxes that topical medications come in in order to help keep track of the instructions about where and how to use these. Pharmacies typically print the medication instructions only on the boxes and not directly on the medication tubes.   If your medication is too expensive, please contact our office at [phone] option 4 or send us a message through MyChart.   We are unable to tell what your co-pay for medications will be in advance as this is different depending on your insurance coverage. However, we may be able to find a substitute medication at lower cost or fill out paperwork to get insurance to cover a needed medication.   If a prior authorization is required to get your medication covered by your insurance company, please allow us 1-2 business days to complete this process.  Drug prices often vary depending on where the prescription is filled and some pharmacies may offer cheaper prices.  The website www.goodrx.com contains coupons for medications through different pharmacies. The prices here do not account for what the cost may be with help from insurance (it may be cheaper with your insurance), but the website can give you the price if you did not use any insurance.  - You can print the associated coupon and take it with   your prescription to the pharmacy.  - You may also stop by our office during regular business hours and pick up a GoodRx coupon card.  - If you need your prescription sent electronically to a different pharmacy, notify our office through [facility] MyChart or by phone at [phone] option 4.     Si Usted Necesita Algo Despus de Su Visita  Tambin puede enviarnos un mensaje a travs de MyChart. Por lo general respondemos a los mensajes de MyChart en el transcurso de 1 a 2  das hbiles.  Para renovar recetas, por favor pida a su farmacia que se ponga en contacto con nuestra oficina. Nuestro nmero de fax es el [phone].  Si tiene un asunto urgente cuando la clnica est cerrada y que no puede esperar hasta el siguiente da hbil, puede llamar/localizar a su doctor(a) al nmero que aparece a continuacin.   Por favor, tenga en cuenta que aunque hacemos todo lo posible para estar disponibles para asuntos urgentes fuera del horario de oficina, no estamos disponibles las 24 horas del da, los 7 das de la semana.   Si tiene un problema urgente y no puede comunicarse con nosotros, puede optar por buscar atencin mdica  en el consultorio de su doctor(a), en una clnica privada, en un centro de atencin urgente o en una sala de emergencias.  Si tiene una emergencia mdica, por favor llame inmediatamente al 911 o vaya a la sala de emergencias.  Nmeros de bper  - Dr. Kowalski: [phone]  - Dra. Moye: [phone]  - Dra. Stewart: [phone]  En caso de inclemencias del tiempo, por favor llame a nuestra lnea principal al [phone] para una actualizacin sobre el estado de cualquier retraso o cierre.  Consejos para la medicacin en dermatologa: Por favor, guarde las cajas en las que vienen los medicamentos de uso tpico para ayudarle a seguir las instrucciones sobre dnde y cmo usarlos. Las farmacias generalmente imprimen las instrucciones del medicamento slo en las cajas y no directamente en los tubos del medicamento.   Si su medicamento es muy caro, por favor, pngase en contacto con nuestra oficina llamando al [phone] y presione la opcin 4 o envenos un mensaje a travs de MyChart.   No podemos decirle cul ser su copago por los medicamentos por adelantado ya que esto es diferente dependiendo de la cobertura de su seguro. Sin embargo, es posible que podamos encontrar un medicamento sustituto a menor costo o llenar un formulario para que el  seguro cubra el medicamento que se considera necesario.   Si se requiere una autorizacin previa para que su compaa de seguros cubra su medicamento, por favor permtanos de 1 a 2 das hbiles para completar este proceso.  Los precios de los medicamentos varan con frecuencia dependiendo del lugar de dnde se surte la receta y alguna farmacias pueden ofrecer precios ms baratos.  El sitio web www.goodrx.com tiene cupones para medicamentos de diferentes farmacias. Los precios aqu no tienen en cuenta lo que podra costar con la ayuda del seguro (puede ser ms barato con su seguro), pero el sitio web puede darle el precio si no utiliz ningn seguro.  - Puede imprimir el cupn correspondiente y llevarlo con su receta a la farmacia.  - Tambin puede pasar por nuestra oficina durante el horario de atencin regular y recoger una tarjeta de cupones de GoodRx.  - Si necesita que su receta se enve electrnicamente a una farmacia diferente, informe a nuestra oficina a travs de MyChart de [place]   o por telfono llamando al [phone] y presione la opcin 4.  

## 2022-02-07 NOTE — Progress Notes (Unsigned)
   Follow-Up Visit   Subjective  Cindy Hester is a 77 y.o. female who presents for the following: Facial Elastosis (Patient is here today for Botox and filler.).  The following portions of the chart were reviewed this encounter and updated as appropriate:   Tobacco  Allergies  Meds  Problems  Med Hx  Surg Hx  Fam Hx     Review of Systems:  No other skin or systemic complaints except as noted in HPI or Assessment and Plan.  Objective  Well appearing patient in no apparent distress; mood and affect are within normal limits.  A focused examination was performed including the face. Relevant physical exam findings are noted in the Assessment and Plan.  Face Rhytides and volume loss.                     Assessment & Plan  Elastosis of skin Face  Botox 48 units injected as marked: - Frown complex 32.5 - DAO's 4 units each for a total of 8 units - Chin 7.5 units   Restylane Refyne 1 syringe injected as marked: - B/L oral commissures - B/L nasolabials - Ant chin  - B/L lat chin   Discussed mid face filler to be scheduled at next Botox appointment.  Filling material injection - Face Prior to the procedure, the patient's past medical history, allergies and the rare but potential risks and complications were reviewed with the patient and a signed consent was obtained. Pre and post-treatment care was discussed and instructions provided.  Risks including vascular occlusion were discussed.   Location: See attached photo  Filler Type: Restylane refyne  Lot # [id number] Exp date: 08/05/2022  Procedure: The area was prepped thoroughly with Puracyn. After introducing the needle into the desired treatment area, the syringe plunger was drawn back to ensure there was no flash of blood prior to injecting the filler in order to minimize risk of intravascular injection and vascular occlusion. After injection of the filler, the treated areas were cleansed and iced to reduce  swelling. Post-treatment instructions were reviewed with the patient.       Patient tolerated the procedure well. The patient will call with any problems, questions or concerns prior to their next appointment.   Botox Injection - Face Location: See attached image  Informed consent: Discussed risks (infection, pain, bleeding, bruising, swelling, allergic reaction, paralysis of nearby muscles, eyelid droop, double vision, neck weakness, difficulty breathing, headache, undesirable cosmetic result, and need for additional treatment) and benefits of the procedure, as well as the alternatives.  Informed consent was obtained.  Preparation: The area was cleansed with alcohol.  Procedure Details:  Botox was injected into the dermis with a 30-gauge needle. Pressure applied to any bleeding. Ice packs offered for swelling.  Lot Number:  [id number] Expiration:  02/26  Total Units Injected:  48  Plan: Tylenol may be used for headache.  Allow 2 weeks before returning to clinic for additional dosing as needed. Patient will call for any problems.    Return in about 4 months (around 06/08/2022) for Botox and filler injections.  Luther Redo, CMA, am acting as scribe for Sarina Ser, MD . Documentation: I have reviewed the above documentation for accuracy and completeness, and I agree with the above.  Sarina Ser, MD

## 2022-02-08 ENCOUNTER — Encounter: Payer: Self-pay | Admitting: Dermatology

## 2022-02-13 DIAGNOSIS — G4733 Obstructive sleep apnea (adult) (pediatric): Secondary | ICD-10-CM | POA: Diagnosis not present

## 2022-02-16 DIAGNOSIS — J301 Allergic rhinitis due to pollen: Secondary | ICD-10-CM | POA: Diagnosis not present

## 2022-02-16 DIAGNOSIS — J3081 Allergic rhinitis due to animal (cat) (dog) hair and dander: Secondary | ICD-10-CM | POA: Diagnosis not present

## 2022-02-16 DIAGNOSIS — J3089 Other allergic rhinitis: Secondary | ICD-10-CM | POA: Diagnosis not present

## 2022-02-23 DIAGNOSIS — J3081 Allergic rhinitis due to animal (cat) (dog) hair and dander: Secondary | ICD-10-CM | POA: Diagnosis not present

## 2022-02-23 DIAGNOSIS — J301 Allergic rhinitis due to pollen: Secondary | ICD-10-CM | POA: Diagnosis not present

## 2022-02-23 DIAGNOSIS — J3089 Other allergic rhinitis: Secondary | ICD-10-CM | POA: Diagnosis not present

## 2022-03-02 DIAGNOSIS — J3089 Other allergic rhinitis: Secondary | ICD-10-CM | POA: Diagnosis not present

## 2022-03-06 DIAGNOSIS — J3089 Other allergic rhinitis: Secondary | ICD-10-CM | POA: Diagnosis not present

## 2022-03-06 DIAGNOSIS — J301 Allergic rhinitis due to pollen: Secondary | ICD-10-CM | POA: Diagnosis not present

## 2022-03-14 DIAGNOSIS — J3089 Other allergic rhinitis: Secondary | ICD-10-CM | POA: Diagnosis not present

## 2022-03-14 DIAGNOSIS — J301 Allergic rhinitis due to pollen: Secondary | ICD-10-CM | POA: Diagnosis not present

## 2022-03-16 DIAGNOSIS — G4733 Obstructive sleep apnea (adult) (pediatric): Secondary | ICD-10-CM | POA: Diagnosis not present

## 2022-03-16 DIAGNOSIS — J3089 Other allergic rhinitis: Secondary | ICD-10-CM | POA: Diagnosis not present

## 2022-03-16 DIAGNOSIS — J301 Allergic rhinitis due to pollen: Secondary | ICD-10-CM | POA: Diagnosis not present

## 2022-03-16 DIAGNOSIS — J3081 Allergic rhinitis due to animal (cat) (dog) hair and dander: Secondary | ICD-10-CM | POA: Diagnosis not present

## 2022-03-20 DIAGNOSIS — E1143 Type 2 diabetes mellitus with diabetic autonomic (poly)neuropathy: Secondary | ICD-10-CM | POA: Diagnosis not present

## 2022-03-20 DIAGNOSIS — K638219 Small intestinal bacterial overgrowth, unspecified: Secondary | ICD-10-CM | POA: Diagnosis not present

## 2022-03-20 DIAGNOSIS — K5900 Constipation, unspecified: Secondary | ICD-10-CM | POA: Diagnosis not present

## 2022-03-20 DIAGNOSIS — K581 Irritable bowel syndrome with constipation: Secondary | ICD-10-CM | POA: Diagnosis not present

## 2022-03-22 DIAGNOSIS — J301 Allergic rhinitis due to pollen: Secondary | ICD-10-CM | POA: Diagnosis not present

## 2022-03-22 DIAGNOSIS — J3089 Other allergic rhinitis: Secondary | ICD-10-CM | POA: Diagnosis not present

## 2022-03-27 DIAGNOSIS — J301 Allergic rhinitis due to pollen: Secondary | ICD-10-CM | POA: Diagnosis not present

## 2022-03-27 DIAGNOSIS — J3089 Other allergic rhinitis: Secondary | ICD-10-CM | POA: Diagnosis not present

## 2022-03-27 DIAGNOSIS — J3081 Allergic rhinitis due to animal (cat) (dog) hair and dander: Secondary | ICD-10-CM | POA: Diagnosis not present

## 2022-04-05 ENCOUNTER — Other Ambulatory Visit: Payer: Self-pay | Admitting: Family Medicine

## 2022-04-05 DIAGNOSIS — J3089 Other allergic rhinitis: Secondary | ICD-10-CM | POA: Diagnosis not present

## 2022-04-05 DIAGNOSIS — J3081 Allergic rhinitis due to animal (cat) (dog) hair and dander: Secondary | ICD-10-CM | POA: Diagnosis not present

## 2022-04-05 DIAGNOSIS — E785 Hyperlipidemia, unspecified: Secondary | ICD-10-CM

## 2022-04-05 DIAGNOSIS — J301 Allergic rhinitis due to pollen: Secondary | ICD-10-CM | POA: Diagnosis not present

## 2022-04-05 MED ORDER — ROSUVASTATIN CALCIUM 20 MG PO TABS: ORAL_TABLET | ORAL | 1 refills | Status: DC

## 2022-04-05 NOTE — Telephone Encounter (Signed)
Medication Refill - Medication: rosuvastatin (CRESTOR) 20 MG tablet   Has the patient contacted their pharmacy? Yes.     Preferred Pharmacy (with phone number or street name):  [facility], [place] [address] Phone: [phone]  Fax: [phone]     Has the patient been seen for an appointment in the last year OR does the patient have an upcoming appointment? Yes.    Please assist patient further as she is completely out of her medicine at this time

## 2022-04-05 NOTE — Telephone Encounter (Signed)
Requested Prescriptions  Pending Prescriptions Disp Refills   rosuvastatin (CRESTOR) 20 MG tablet 90 tablet 2    Sig: TAKE 1 TABLET(20 MG) BY MOUTH AT BEDTIME     Cardiovascular:  Antilipid - Statins 2 Failed - 04/05/2022 12:51 PM      Failed - Lipid Panel in normal range within the last 12 months    Cholesterol, Total  Date Value Ref Range Status  10/27/2021 147 100 - 199 mg/dL Final   LDL Chol Calc (NIH)  Date Value Ref Range Status  10/27/2021 69 0 - 99 mg/dL Final   HDL  Date Value Ref Range Status  10/27/2021 58 >39 mg/dL Final   Triglycerides  Date Value Ref Range Status  10/27/2021 114 0 - 149 mg/dL Final         Passed - Cr in normal range and within 360 days    Creatinine, Ser  Date Value Ref Range Status  10/27/2021 0.99 0.57 - 1.00 mg/dL Final         Passed - Patient is not pregnant      Passed - Valid encounter within last 12 months    Recent Outpatient Visits           5 months ago Encounter for annual physical exam   [facility] Simmons-Robinson, Riki Sheer, MD   1 year ago Strain of neck muscle, initial encounter   [facility] Kirschner, PA-C   1 year ago Annual physical exam   [facility] Eulas Post, MD   2 years ago Chronic maxillary sinusitis   [facility] Trin[facility] Post, [place]   2 years ago Annual physical exam   [facility] Eulas Post, MD       Future Appointments             In 3 weeks Simmons-Robinson, Riki Sheer, MD [facility]

## 2022-04-12 DIAGNOSIS — J301 Allergic rhinitis due to pollen: Secondary | ICD-10-CM | POA: Diagnosis not present

## 2022-04-12 DIAGNOSIS — J3089 Other allergic rhinitis: Secondary | ICD-10-CM | POA: Diagnosis not present

## 2022-04-12 DIAGNOSIS — J3081 Allergic rhinitis due to animal (cat) (dog) hair and dander: Secondary | ICD-10-CM | POA: Diagnosis not present

## 2022-04-14 DIAGNOSIS — G4733 Obstructive sleep apnea (adult) (pediatric): Secondary | ICD-10-CM | POA: Diagnosis not present

## 2022-04-25 NOTE — Progress Notes (Unsigned)
I,Cindy Hester,acting as a scribe for [employer], MD.,have documented all relevant documentation on the behalf of Cindy Foster, MD,as directed by  Cindy Foster, MD while in the presence of Cindy Foster, MD.   Established patient visit   Patient: Cindy Hester   DOB: [birth year]   76 y.o. Female  MRN: [id number] Visit Date: 04/27/2022  Today's healthcare provider: [name], MD   Chief Complaint  Patient presents with   follow-up Chronic Disease   Subjective    HPI  Hypertension, follow-up  BP Readings from Last 3 Encounters:  04/27/22 123/74  11/13/21 (!) 115/44  10/27/21 111/68   Wt Readings from Last 3 Encounters:  04/27/22 174 lb 12.8 oz (79.3 kg)  11/13/21 168 lb 9.6 oz (76.5 kg)  10/27/21 163 lb 11.2 oz (74.3 kg)     She was last seen for hypertension 6 months ago.  BP at that visit was 111/68. Management since that visit includes continue current medications.  She reports excellent compliance with treatment.  Outside blood pressures are well controlled. Symptoms: No chest pain No chest pressure  No palpitations No syncope  No dyspnea No orthopnea  No paroxysmal nocturnal dyspnea Yes lower extremity edema   Pertinent labs Lab Results  Component Value Date   CHOL 147 10/27/2021   HDL 58 10/27/2021   LDLCALC 69 10/27/2021   TRIG 114 10/27/2021   CHOLHDL 2.5 10/27/2021   Lab Results  Component Value Date   NA 135 10/27/2021   K 4.4 10/27/2021   CREATININE 0.99 10/27/2021   EGFR 60 10/27/2021   GLUCOSE 107 (H) 10/27/2021   TSH 1.030 10/27/2021     The 10-year ASCVD risk score (Arnett DK, et al., 2019) is: 30.3%  ---------------------------------------------------------------------------------------------------  Sleep Apnea: Patient presents for follow-up obstructive sleep apnea. Management since last visit include Order placed for CPAP with automated titration and appropriate  supplies. Patient generally gets approximately 6-7  hours of sleep per night, and states they generally have difficulty falling back asleep if awakened. Continues to use CPAP nightly. Reports using cpap consistently for 20 years    Non smoker: patient has form for insurance purposes to confirm her status as non-smoker, she is agreeable to obtaining nicotine levels today    Diabetes Mellitus Type II, Follow-up  Lab Results  Component Value Date   HGBA1C 6.7 (A) 04/27/2022   HGBA1C 6.1 (H) 09/20/2021   HGBA1C 6.5 (H) 03/11/2021   Wt Readings from Last 3 Encounters:  04/27/22 174 lb 12.8 oz (79.3 kg)  11/13/21 168 lb 9.6 oz (76.5 kg)  10/27/21 163 lb 11.2 oz (74.3 kg)   Last seen for diabetes 6 months ago.  Management since then includes continue ozempic 0.5mg  weekly and jardiance 25mg  daily.  She reports excellent compliance with treatment. She is not having side effects.  Symptoms: No fatigue No foot ulcerations     No paresthesia of the feet  No polydipsia  No polyuria No visual disturbances   No vomiting     Home blood sugar records: fasting range: not being checked  Episodes of hypoglycemia? 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, reports having knee pains, has had gel and steroid injections  Reports that her gait is not as good 2/2 to knee pain  Follows with orthopedics She reports she has concerns about her mobility  She reports that she feels tired when moving from seated  to standing and walking  Standing for a long time makes her have leg pain    Nerve Pain, hands  She reports that she has numbness in her hands early in the morning  She states that her hands feel as if they have "fallen asleep"  She reports being a side sleep and often rests her head on her hands throughout the night  Also reports having steroid injection in the past for  She reports right, base of her thumb burning sensation    Medications: Outpatient Medications Prior to Visit  Medication Sig   acetaminophen (TYLENOL) 500 MG tablet Take 1,000 mg by mouth daily as needed. Will alternate with 650 mg, 2 tabs daily when pain increased   amLODipine (NORVASC) 10 MG tablet TAKE 1 TABLET(10 MG) BY MOUTH DAILY   aspirin 81 MG chewable tablet Chew 81 mg by mouth daily.   azelastine (ASTELIN) 0.1 % nasal spray SMARTSIG:1-2 Spray(s) Both Nares Twice Daily   BIOTIN 5000 PO Take by mouth daily.   cetirizine (ZYRTEC) 10 MG tablet Take 10 mg by mouth daily.   cholecalciferol (VITAMIN D) 1000 UNITS tablet Take 5,000 Units by mouth.  Twice a week Mon, Fri   docusate sodium (COLACE) 100 MG capsule Take 100 mg by mouth 2 (two) times daily.   empagliflozin (JARDIANCE) 25 MG TABS tablet Take 25 mg by mouth daily.   EPINEPHrine 0.3 mg/0.3 mL IJ SOAJ injection INJ UTD   ezetimibe (ZETIA) 10 MG tablet TAKE 1 TABLET(10 MG) BY MOUTH DAILY AFTER SUPPER   FIBER SELECT GUMMIES PO Take by mouth.   furosemide (LASIX) 40 MG tablet TAKE 1/2 TABLET BY MOUTH EVERY DAY AS NEEDED   GLUCOSAMINE-CHONDROITIN PO Take 2,000 mg by mouth daily.   lactulose (CHRONULAC) 10 GM/15ML solution    metoprolol (TOPROL-XL) 200 MG 24 hr tablet TAKE 1 TABLET(200 MG) BY MOUTH DAILY   montelukast (SINGULAIR) 10 MG tablet TAKE 1 TABLET BY MOUTH DAILY   Multiple Vitamin (MULTIVITAMIN) tablet Take 1 tablet by mouth daily.   NON FORMULARY CPAP   OZEMPIC, 0.25 OR 0.5 MG/DOSE, 2 MG/1.5ML SOPN INJ 0.5 MG Cindy Hester ONCE A WEEK   pantoprazole (PROTONIX)  40 MG tablet TAKE 1 TABLET(40 MG) BY MOUTH DAILY   rosuvastatin (CRESTOR) 20 MG tablet TAKE 1 TABLET(20 MG) BY MOUTH AT BEDTIME   spironolactone (ALDACTONE) 25 MG tablet TAKE 1 TABLET BY MOUTH EVERY DAY   valACYclovir (VALTREX) 500 MG tablet TAKE 1 TABLET BY MOUTH TWICE DAILY IF NEEDED   valsartan-hydrochlorothiazide (DIOVAN-HCT) 160-25 MG tablet TAKE 1 TABLET BY MOUTH EVERY MORNING   vitamin B-12 (CYANOCOBALAMIN) 1000 MCG tablet Take 2,500 mcg by mouth 2 (two) times a week. Mon, Fri   [DISCONTINUED] celecoxib (CELEBREX) 200 MG capsule Take 1 capsule (200 mg total) by mouth 2 (two) times daily.   [DISCONTINUED] oxymetazoline (AFRIN) 0.05 % nasal spray Place 2 sprays into both nostrils at bedtime. Prior to CPAP   No facility-administered medications prior to visit.    Review of Systems     Objective  BP 123/74 (BP Location: Left Arm, Patient Position: Sitting, Cuff Size: Large)   Pulse 71   Temp 97.8 F (36.6 C) (Oral)   Resp 16   Ht 5' 6.5" (1.689 m)   Wt 174 lb 12.8 oz (79.3 kg)   BMI 27.79 kg/m    Physical Exam Vitals reviewed.  Constitutional:      General: She is not in acute distress.    Appearance: Normal appearance. She is not ill-appearing, toxic-appearing or diaphoretic.  Eyes:     Conjunctiva/sclera: Conjunctivae normal.  Neck:     Thyroid: No thyroid mass, thyromegaly or thyroid tenderness.     Vascular: No carotid bruit.  Cardiovascular:     Rate and Rhythm: Normal rate and regular rhythm.     Pulses: Normal pulses.     Heart sounds: Murmur heard.     No friction rub. No gallop.  Pulmonary:     Effort: Pulmonary effort is normal. No respiratory distress.     Breath sounds: Normal breath sounds. No stridor. No wheezing, rhonchi or rales.  Abdominal:     General: Bowel sounds are normal. There is no distension.     Palpations: Abdomen is soft.     Tenderness: There is no abdominal tenderness.  Musculoskeletal:     Right lower leg: Edema present.      Left lower leg: Edema present.  Lymphadenopathy:     Cervical: No cervical adenopathy.  Skin:    Findings: No erythema or rash.  Neurological:     Mental Status: She is alert and oriented to person, place, and time.      Results for orders placed or performed in visit on 04/27/22  Nicotine/cotinine metabolites  Result Value Ref Range   Nicotine WILL FOLLOW    Cotinine WILL FOLLOW   Ferritin  Result Value Ref Range   Ferritin 51 15 - 150 ng/mL  CBC  Result Value Ref Range   WBC 6.1 3.4 - 10.8 x10E3/uL   RBC 4.93 3.77 - 5.28 x10E6/uL   Hemoglobin 14.4 11.1 - 15.9 g/dL   Hematocrit 43.7 34.0 - 46.6 %   MCV 89 79 - 97 fL   MCH 29.2 26.6 - 33.0 pg   MCHC 33.0 31.5 - 35.7 g/dL   RDW 13.8 11.7 - 15.4 %   Platelets 301 150 - 450 x10E3/uL  POCT HgB A1C  Result Value Ref Range   Hemoglobin A1C 6.7 (A) 4.0 - 5.6 %   Est. average glucose Bld gHb Est-mCnc 146     Assessment & Plan     Problem List Items Addressed This Visit       Cardiovascular and Mediastinum   Essential hypertension, benign - Primary    Controlled BP at goal Continue current medications at current doses No medications changes today          Respiratory   Obstructive apnea    Chronic  Symptoms well controlled  Continues to wear nightly CPAP machine        Endocrine   Diabetes (HCC)    Chronic  Well controlled, at goal of less than 7  Follows with Endocrinololgy She continues jardiance 25mg  and ozempic 0.5mg   POCT A1c  Reports ongoing neuropathy symptoms, will check ferritin and CBC today to make sure IDA is not a cause of symptoms, 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  UTD on eye exam  Patient is on statin and ARB       Relevant Orders  POCT HgB A1C (Completed)   Ferritin (Completed)   CBC (Completed)     Other   Nonsmoker    Patient reports hx of never smoking  She has documentation requesting physician confirmation of non-smoker status  Document can be completed if physician  either confirms no nicotine in the blood or has been patient's provider for 5 years  This is second encounter with this patient so we will collect nicotine today and complete paperwork once results are available       Relevant Orders   Nicotine/cotinine metabolites (Completed)     Return in about 6 months (around 10/29/2022) for ANNUAL physical.        The entirety of the information documented in the History of Present Illness, Review of Systems and Physical Exam were personally obtained by me. Portions of this information were initially documented by Lyndel Pleasure, CMA . I, Cindy Foster, MD have reviewed the documentation above for thoroughness and accuracy.      Cindy Foster, MD  [facility] [phone] (phone) [phone] (fax)  Reader

## 2022-04-27 ENCOUNTER — Encounter: Payer: Self-pay | Admitting: Family Medicine

## 2022-04-27 ENCOUNTER — Ambulatory Visit (INDEPENDENT_AMBULATORY_CARE_PROVIDER_SITE_OTHER): Payer: BC Managed Care – PPO | Admitting: Family Medicine

## 2022-04-27 VITALS — BP 123/74 | HR 71 | Temp 97.8°F | Resp 16 | Ht 66.5 in | Wt 174.8 lb

## 2022-04-27 DIAGNOSIS — I10 Essential (primary) hypertension: Secondary | ICD-10-CM | POA: Diagnosis not present

## 2022-04-27 DIAGNOSIS — J3089 Other allergic rhinitis: Secondary | ICD-10-CM | POA: Diagnosis not present

## 2022-04-27 DIAGNOSIS — Z794 Long term (current) use of insulin: Secondary | ICD-10-CM

## 2022-04-27 DIAGNOSIS — Z0189 Encounter for other specified special examinations: Secondary | ICD-10-CM | POA: Diagnosis not present

## 2022-04-27 DIAGNOSIS — Z789 Other specified health status: Secondary | ICD-10-CM | POA: Diagnosis not present

## 2022-04-27 DIAGNOSIS — J3081 Allergic rhinitis due to animal (cat) (dog) hair and dander: Secondary | ICD-10-CM | POA: Diagnosis not present

## 2022-04-27 DIAGNOSIS — G4733 Obstructive sleep apnea (adult) (pediatric): Secondary | ICD-10-CM

## 2022-04-27 DIAGNOSIS — J301 Allergic rhinitis due to pollen: Secondary | ICD-10-CM | POA: Diagnosis not present

## 2022-04-27 DIAGNOSIS — G629 Polyneuropathy, unspecified: Secondary | ICD-10-CM | POA: Diagnosis not present

## 2022-04-27 DIAGNOSIS — E1142 Type 2 diabetes mellitus with diabetic polyneuropathy: Secondary | ICD-10-CM | POA: Diagnosis not present

## 2022-04-27 LAB — POCT GLYCOSYLATED HEMOGLOBIN (HGB A1C)
Est. average glucose Bld gHb Est-mCnc: 146
Hemoglobin A1C: 6.7 % — AB (ref 4.0–5.6)

## 2022-04-27 NOTE — Assessment & Plan Note (Signed)
Chronic  Symptoms well controlled  Continues to wear nightly CPAP machine

## 2022-04-27 NOTE — Assessment & Plan Note (Signed)
Controlled BP at goal Continue current medications at current doses No medications changes today   

## 2022-04-27 NOTE — Assessment & Plan Note (Addendum)
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

## 2022-04-27 NOTE — Patient Instructions (Signed)
It was a pleasure to see you today!  Thank you for choosing [facility] for your primary care.   Cindy Hester was seen for hypertension, diabetes and neuropathy.   Our plans for today were: Your hemoglobin A1c was 6.7 today We will collect lab work today to test for iron deficiency and anemia  We will follow up with results of labs once they are available.  We will also collect labs to confirm low nicotine levels     You should return to our clinic in 6 months for annual physical.   Best Wishes,   Dr. Quentin Cornwall

## 2022-04-28 NOTE — Assessment & Plan Note (Signed)
Patient reports hx of never smoking  She has documentation requesting physician confirmation of non-smoker status  Document can be completed if physician either confirms no nicotine in the blood or has been patient's provider for 5 years  This is second encounter with this patient so we will collect nicotine today and complete paperwork once results are available

## 2022-05-02 DIAGNOSIS — E1159 Type 2 diabetes mellitus with other circulatory complications: Secondary | ICD-10-CM | POA: Diagnosis not present

## 2022-05-02 DIAGNOSIS — E1169 Type 2 diabetes mellitus with other specified complication: Secondary | ICD-10-CM | POA: Diagnosis not present

## 2022-05-02 DIAGNOSIS — E119 Type 2 diabetes mellitus without complications: Secondary | ICD-10-CM | POA: Diagnosis not present

## 2022-05-02 DIAGNOSIS — E559 Vitamin D deficiency, unspecified: Secondary | ICD-10-CM | POA: Diagnosis not present

## 2022-05-10 LAB — FERRITIN: Ferritin: 51 ng/mL (ref 15–150)

## 2022-05-10 LAB — CBC
Hematocrit: 43.7 % (ref 34.0–46.6)
Hemoglobin: 14.4 g/dL (ref 11.1–15.9)
MCH: 29.2 pg (ref 26.6–33.0)
MCHC: 33 g/dL (ref 31.5–35.7)
MCV: 89 fL (ref 79–97)
Platelets: 301 10*3/uL (ref 150–450)
RBC: 4.93 x10E6/uL (ref 3.77–5.28)
RDW: 13.8 % (ref 11.7–15.4)
WBC: 6.1 10*3/uL (ref 3.4–10.8)

## 2022-05-10 LAB — NICOTINE/COTININE METABOLITES
Cotinine: <1 ng/mL
Nicotine: <1 ng/mL

## 2022-05-15 DIAGNOSIS — G4733 Obstructive sleep apnea (adult) (pediatric): Secondary | ICD-10-CM | POA: Diagnosis not present

## 2022-05-18 DIAGNOSIS — J3081 Allergic rhinitis due to animal (cat) (dog) hair and dander: Secondary | ICD-10-CM | POA: Diagnosis not present

## 2022-05-18 DIAGNOSIS — J3089 Other allergic rhinitis: Secondary | ICD-10-CM | POA: Diagnosis not present

## 2022-05-18 DIAGNOSIS — J301 Allergic rhinitis due to pollen: Secondary | ICD-10-CM | POA: Diagnosis not present

## 2022-05-24 DIAGNOSIS — J301 Allergic rhinitis due to pollen: Secondary | ICD-10-CM | POA: Diagnosis not present

## 2022-05-24 DIAGNOSIS — J3089 Other allergic rhinitis: Secondary | ICD-10-CM | POA: Diagnosis not present

## 2022-05-24 DIAGNOSIS — J3081 Allergic rhinitis due to animal (cat) (dog) hair and dander: Secondary | ICD-10-CM | POA: Diagnosis not present

## 2022-06-01 DIAGNOSIS — J3089 Other allergic rhinitis: Secondary | ICD-10-CM | POA: Diagnosis not present

## 2022-06-01 DIAGNOSIS — J301 Allergic rhinitis due to pollen: Secondary | ICD-10-CM | POA: Diagnosis not present

## 2022-06-01 DIAGNOSIS — J3081 Allergic rhinitis due to animal (cat) (dog) hair and dander: Secondary | ICD-10-CM | POA: Diagnosis not present

## 2022-06-06 ENCOUNTER — Ambulatory Visit (INDEPENDENT_AMBULATORY_CARE_PROVIDER_SITE_OTHER): Payer: Self-pay | Admitting: Dermatology

## 2022-06-06 VITALS — BP 134/63

## 2022-06-06 DIAGNOSIS — L988 Other specified disorders of the skin and subcutaneous tissue: Secondary | ICD-10-CM

## 2022-06-06 NOTE — Progress Notes (Signed)
   Follow-Up Visit   Subjective  Cindy Hester is a 77 y.o. female who presents for the following: filler for facial elastosis  The following portions of the chart were reviewed this encounter and updated as appropriate: medications, allergies, medical history  Review of Systems:  No other skin or systemic complaints except as noted in HPI or Assessment and Plan.  Objective  Well appearing patient in no apparent distress; mood and affect are within normal limits.  A focused examination was performed of the face. Relevant physical exam findings are noted in the Assessment and Plan or shown in photos.  Before photos         After photos          Assessment & Plan    Facial Elastosis  Discussed Fraxel vs Halo laser for pebbling of chin. Advised patient that we are not currently doing these procedures in our office. Recommend Alastin Regenerating Skin Nectar.  Prior to the procedure, the patient's past medical history, allergies and the rare but potential risks and complications were reviewed with the patient and a signed consent was obtained. Pre and post-treatment care was discussed and instructions provided.   Location: nasolabial folds, marionette lines, and chin  Filler Type: Restylane Refyne Lot #[id number] Exp 11/05/2022  Procedure: The area was prepped thoroughly with Puracyn. After introducing the needle into the desired treatment area, the syringe plunger was drawn back to ensure there was no flash of blood prior to injecting the filler in order to minimize risk of intravascular injection and vascular occlusion. After injection of the filler, the treated areas were cleansed and iced to reduce swelling. Post-treatment instructions were reviewed with the patient.       Patient tolerated the procedure well. The patient will call with any problems, questions or concerns prior to their next appointment.  Facial Elastosis  Location: See attached image   Informed  consent: Discussed risks (infection, pain, bleeding, bruising, swelling, allergic reaction, paralysis of nearby muscles, eyelid droop, double vision, neck weakness, difficulty breathing, headache, undesirable cosmetic result, and need for additional treatment) and benefits of the procedure, as well as the alternatives.  Informed consent was obtained.  Preparation: The area was cleansed with alcohol.  Procedure Details:  Botox was injected into the dermis with a 30-gauge needle. Pressure applied to any bleeding. Ice packs offered for swelling.  Lot Number:  [id number] Expiration:  05/2024  Total Units Injected:  48   Plan: Tylenol may be used for headache.  Allow 2 weeks before returning to clinic for additional dosing as needed. Patient will call for any problems.  Purpura - Chronic; persistent and recurrent.  Treatable, but not curable. - Violaceous macules and patches - Benign - Related to trauma, age, sun damage and/or use of blood thinners, chronic use of topical and/or oral steroids - Observe - Can use OTC arnica containing moisturizer such as Dermend Bruise Formula if desired - Call for worsening or other concerns  Return for Botox in 3-4 months.  I, Joanie Coddington, CMA, am acting as scribe for Armida Sans, MD .   Documentation: I have reviewed the above documentation for accuracy and completeness, and I agree with the above.  Armida Sans, MD

## 2022-06-06 NOTE — Patient Instructions (Signed)
Due to recent changes in healthcare laws, you may see results of your pathology and/or laboratory studies on MyChart before the doctors have had a chance to review them. We understand that in some cases there may be results that are confusing or concerning to you. Please understand that not all results are received at the same time and often the doctors may need to interpret multiple results in order to provide you with the best plan of care or course of treatment. Therefore, we ask that you please give us 2 business days to thoroughly review all your results before contacting the office for clarification. Should we see a critical lab result, you will be contacted sooner.   If You Need Anything After Your Visit  If you have any questions or concerns for your doctor, please call our main line at [phone] and press option 4 to reach your doctor's medical assistant. If no one answers, please leave a voicemail as directed and we will return your call as soon as possible. Messages left after 4 pm will be answered the following business day.   You may also send us a message via MyChart. We typically respond to MyChart messages within 1-2 business days.  For prescription refills, please ask your pharmacy to contact our office. Our fax number is [phone].  If you have an urgent issue when the clinic is closed that cannot wait until the next business day, you can page your doctor at the number below.    Please note that while we do our best to be available for urgent issues outside of office hours, we are not available 24/7.   If you have an urgent issue and are unable to reach us, you may choose to seek medical care at your doctor's office, retail clinic, urgent care center, or emergency room.  If you have a medical emergency, please immediately call 911 or go to the emergency department.  Pager Numbers  - Dr. Kowalski: [phone]  - Dr. Moye: [phone]  - Dr. Stewart:  [phone]  In the event of inclement weather, please call our main line at [phone] for an update on the status of any delays or closures.  Dermatology Medication Tips: Please keep the boxes that topical medications come in in order to help keep track of the instructions about where and how to use these. Pharmacies typically print the medication instructions only on the boxes and not directly on the medication tubes.   If your medication is too expensive, please contact our office at [phone] option 4 or send us a message through MyChart.   We are unable to tell what your co-pay for medications will be in advance as this is different depending on your insurance coverage. However, we may be able to find a substitute medication at lower cost or fill out paperwork to get insurance to cover a needed medication.   If a prior authorization is required to get your medication covered by your insurance company, please allow us 1-2 business days to complete this process.  Drug prices often vary depending on where the prescription is filled and some pharmacies may offer cheaper prices.  The website www.goodrx.com contains coupons for medications through different pharmacies. The prices here do not account for what the cost may be with help from insurance (it may be cheaper with your insurance), but the website can give you the price if you did not use any insurance.  - You can print the associated coupon and take it with   your prescription to the pharmacy.  - You may also stop by our office during regular business hours and pick up a GoodRx coupon card.  - If you need your prescription sent electronically to a different pharmacy, notify our office through [facility] MyChart or by phone at [phone] option 4.     Si Usted Necesita Algo Despus de Su Visita  Tambin puede enviarnos un mensaje a travs de MyChart. Por lo general respondemos a los mensajes de MyChart en el transcurso de 1 a 2  das hbiles.  Para renovar recetas, por favor pida a su farmacia que se ponga en contacto con nuestra oficina. Nuestro nmero de fax es el [phone].  Si tiene un asunto urgente cuando la clnica est cerrada y que no puede esperar hasta el siguiente da hbil, puede llamar/localizar a su doctor(a) al nmero que aparece a continuacin.   Por favor, tenga en cuenta que aunque hacemos todo lo posible para estar disponibles para asuntos urgentes fuera del horario de oficina, no estamos disponibles las 24 horas del da, los 7 das de la semana.   Si tiene un problema urgente y no puede comunicarse con nosotros, puede optar por buscar atencin mdica  en el consultorio de su doctor(a), en una clnica privada, en un centro de atencin urgente o en una sala de emergencias.  Si tiene una emergencia mdica, por favor llame inmediatamente al 911 o vaya a la sala de emergencias.  Nmeros de bper  - Dr. Kowalski: [phone]  - Dra. Moye: [phone]  - Dra. Stewart: [phone]  En caso de inclemencias del tiempo, por favor llame a nuestra lnea principal al [phone] para una actualizacin sobre el estado de cualquier retraso o cierre.  Consejos para la medicacin en dermatologa: Por favor, guarde las cajas en las que vienen los medicamentos de uso tpico para ayudarle a seguir las instrucciones sobre dnde y cmo usarlos. Las farmacias generalmente imprimen las instrucciones del medicamento slo en las cajas y no directamente en los tubos del medicamento.   Si su medicamento es muy caro, por favor, pngase en contacto con nuestra oficina llamando al [phone] y presione la opcin 4 o envenos un mensaje a travs de MyChart.   No podemos decirle cul ser su copago por los medicamentos por adelantado ya que esto es diferente dependiendo de la cobertura de su seguro. Sin embargo, es posible que podamos encontrar un medicamento sustituto a menor costo o llenar un formulario para que el  seguro cubra el medicamento que se considera necesario.   Si se requiere una autorizacin previa para que su compaa de seguros cubra su medicamento, por favor permtanos de 1 a 2 das hbiles para completar este proceso.  Los precios de los medicamentos varan con frecuencia dependiendo del lugar de dnde se surte la receta y alguna farmacias pueden ofrecer precios ms baratos.  El sitio web www.goodrx.com tiene cupones para medicamentos de diferentes farmacias. Los precios aqu no tienen en cuenta lo que podra costar con la ayuda del seguro (puede ser ms barato con su seguro), pero el sitio web puede darle el precio si no utiliz ningn seguro.  - Puede imprimir el cupn correspondiente y llevarlo con su receta a la farmacia.  - Tambin puede pasar por nuestra oficina durante el horario de atencin regular y recoger una tarjeta de cupones de GoodRx.  - Si necesita que su receta se enve electrnicamente a una farmacia diferente, informe a nuestra oficina a travs de MyChart de [place]   o por telfono llamando al [phone] y presione la opcin 4.  

## 2022-06-07 DIAGNOSIS — J301 Allergic rhinitis due to pollen: Secondary | ICD-10-CM | POA: Diagnosis not present

## 2022-06-07 DIAGNOSIS — J3081 Allergic rhinitis due to animal (cat) (dog) hair and dander: Secondary | ICD-10-CM | POA: Diagnosis not present

## 2022-06-07 DIAGNOSIS — J3089 Other allergic rhinitis: Secondary | ICD-10-CM | POA: Diagnosis not present

## 2022-06-12 ENCOUNTER — Encounter: Payer: Self-pay | Admitting: Dermatology

## 2022-06-15 DIAGNOSIS — M25462 Effusion, left knee: Secondary | ICD-10-CM | POA: Diagnosis not present

## 2022-06-15 DIAGNOSIS — J3081 Allergic rhinitis due to animal (cat) (dog) hair and dander: Secondary | ICD-10-CM | POA: Diagnosis not present

## 2022-06-15 DIAGNOSIS — G8929 Other chronic pain: Secondary | ICD-10-CM | POA: Diagnosis not present

## 2022-06-15 DIAGNOSIS — M1712 Unilateral primary osteoarthritis, left knee: Secondary | ICD-10-CM | POA: Diagnosis not present

## 2022-06-15 DIAGNOSIS — J301 Allergic rhinitis due to pollen: Secondary | ICD-10-CM | POA: Diagnosis not present

## 2022-06-15 DIAGNOSIS — J3089 Other allergic rhinitis: Secondary | ICD-10-CM | POA: Diagnosis not present

## 2022-07-06 ENCOUNTER — Other Ambulatory Visit: Payer: Self-pay | Admitting: Family Medicine

## 2022-07-06 DIAGNOSIS — J301 Allergic rhinitis due to pollen: Secondary | ICD-10-CM | POA: Diagnosis not present

## 2022-07-06 DIAGNOSIS — J3089 Other allergic rhinitis: Secondary | ICD-10-CM | POA: Diagnosis not present

## 2022-07-06 DIAGNOSIS — J3081 Allergic rhinitis due to animal (cat) (dog) hair and dander: Secondary | ICD-10-CM | POA: Diagnosis not present

## 2022-07-06 DIAGNOSIS — E785 Hyperlipidemia, unspecified: Secondary | ICD-10-CM

## 2022-07-06 NOTE — Telephone Encounter (Signed)
Requested Prescriptions  Refused Prescriptions Disp Refills   rosuvastatin (CRESTOR) 20 MG tablet [Pharmacy Med Name: ROSUVASTATIN 20MG  TABLETS] 90 tablet 1    Sig: TAKE 1 TABLET(20 MG) BY MOUTH AT BEDTIME     Cardiovascular:  Antilipid - Statins 2 Failed - 07/06/2022  8:09 AM      Failed - Lipid Panel in normal range within the last 12 months    Cholesterol, Total  Date Value Ref Range Status  10/27/2021 147 100 - 199 mg/dL Final   LDL Chol Calc (NIH)  Date Value Ref Range Status  10/27/2021 69 0 - 99 mg/dL Final   HDL  Date Value Ref Range Status  10/27/2021 58 >39 mg/dL Final   Triglycerides  Date Value Ref Range Status  10/27/2021 114 0 - 149 mg/dL Final         Passed - Cr in normal range and within 360 days    Creatinine, Ser  Date Value Ref Range Status  10/27/2021 0.99 0.57 - 1.00 mg/dL Final         Passed - Patient is not pregnant      Passed - Valid encounter within last 12 months    Recent Outpatient Visits           2 months ago Essential hypertension, benign   [facility] Simmons-Robinson, Tawanna Cooler, MD   8 months ago Encounter for annual physical exam   [facility] Simmons-Robinson, [place], MD   1 year ago Strain of neck muscle, initial encounter   [facility] Alfredia Ferguson, PA-C   1 year ago Annual physical exam   [facility] Bosie Clos, MD   2 years ago Chronic maxillary sinusitis   [facility] [place], Ricki Rodriguez [place], [place]

## 2022-07-09 ENCOUNTER — Ambulatory Visit
Admission: RE | Admit: 2022-07-09 | Discharge: 2022-07-09 | Disposition: A | Discharge status: Home / Self Care | Payer: BC Managed Care – PPO | Source: Ambulatory Visit | Attending: Obstetrics and Gynecology | Admitting: Obstetrics and Gynecology

## 2022-07-09 DIAGNOSIS — Z1231 Encounter for screening mammogram for malignant neoplasm of breast: Secondary | ICD-10-CM | POA: Insufficient documentation

## 2022-07-12 DIAGNOSIS — J301 Allergic rhinitis due to pollen: Secondary | ICD-10-CM | POA: Diagnosis not present

## 2022-07-12 DIAGNOSIS — J3089 Other allergic rhinitis: Secondary | ICD-10-CM | POA: Diagnosis not present

## 2022-07-12 DIAGNOSIS — J3081 Allergic rhinitis due to animal (cat) (dog) hair and dander: Secondary | ICD-10-CM | POA: Diagnosis not present

## 2022-07-16 ENCOUNTER — Encounter: Payer: Self-pay | Admitting: Family Medicine

## 2022-07-16 DIAGNOSIS — Z0189 Encounter for other specified special examinations: Secondary | ICD-10-CM | POA: Insufficient documentation

## 2022-07-16 DIAGNOSIS — M1712 Unilateral primary osteoarthritis, left knee: Secondary | ICD-10-CM | POA: Diagnosis not present

## 2022-07-16 DIAGNOSIS — G629 Polyneuropathy, unspecified: Secondary | ICD-10-CM | POA: Insufficient documentation

## 2022-07-17 ENCOUNTER — Other Ambulatory Visit: Payer: Self-pay | Admitting: Cardiology

## 2022-07-17 DIAGNOSIS — I6523 Occlusion and stenosis of bilateral carotid arteries: Secondary | ICD-10-CM

## 2022-07-23 ENCOUNTER — Other Ambulatory Visit: Payer: Self-pay | Admitting: Family Medicine

## 2022-07-23 DIAGNOSIS — G8929 Other chronic pain: Secondary | ICD-10-CM | POA: Diagnosis not present

## 2022-07-23 DIAGNOSIS — M25462 Effusion, left knee: Secondary | ICD-10-CM | POA: Diagnosis not present

## 2022-07-23 DIAGNOSIS — M1712 Unilateral primary osteoarthritis, left knee: Secondary | ICD-10-CM | POA: Diagnosis not present

## 2022-07-23 DIAGNOSIS — M25562 Pain in left knee: Secondary | ICD-10-CM | POA: Diagnosis not present

## 2022-07-27 DIAGNOSIS — J3081 Allergic rhinitis due to animal (cat) (dog) hair and dander: Secondary | ICD-10-CM | POA: Diagnosis not present

## 2022-07-27 DIAGNOSIS — J3089 Other allergic rhinitis: Secondary | ICD-10-CM | POA: Diagnosis not present

## 2022-07-27 DIAGNOSIS — J301 Allergic rhinitis due to pollen: Secondary | ICD-10-CM | POA: Diagnosis not present

## 2022-08-03 DIAGNOSIS — J3081 Allergic rhinitis due to animal (cat) (dog) hair and dander: Secondary | ICD-10-CM | POA: Diagnosis not present

## 2022-08-03 DIAGNOSIS — J301 Allergic rhinitis due to pollen: Secondary | ICD-10-CM | POA: Diagnosis not present

## 2022-08-03 DIAGNOSIS — J3089 Other allergic rhinitis: Secondary | ICD-10-CM | POA: Diagnosis not present

## 2022-08-21 DIAGNOSIS — G4733 Obstructive sleep apnea (adult) (pediatric): Secondary | ICD-10-CM | POA: Diagnosis not present

## 2022-09-10 ENCOUNTER — Telehealth: Payer: Self-pay | Admitting: Family Medicine

## 2022-09-10 ENCOUNTER — Other Ambulatory Visit: Payer: Self-pay

## 2022-09-10 DIAGNOSIS — K121 Other forms of stomatitis: Secondary | ICD-10-CM

## 2022-09-10 MED ORDER — PANTOPRAZOLE SODIUM 40 MG PO TBEC: 40.0000 mg | DELAYED_RELEASE_TABLET | Freq: Every day | ORAL | 3 refills | Status: AC

## 2022-09-10 NOTE — Telephone Encounter (Signed)
[facility] faxed refill request for the following medications:   pantoprazole (PROTONIX) 40 MG tablet    Please advise.  

## 2022-09-12 ENCOUNTER — Telehealth: Payer: Self-pay | Admitting: Family Medicine

## 2022-09-12 MED ORDER — METOPROLOL SUCCINATE ER 200 MG PO TB24: 200.0000 mg | ORAL_TABLET | Freq: Every day | ORAL | 1 refills | Status: DC

## 2022-09-12 NOTE — Telephone Encounter (Signed)
[facility] pharmacy is requesting prescription refill metoprolol (TOPROL-XL) 200 MG 24 hr tablet   Please advise

## 2022-09-14 ENCOUNTER — Ambulatory Visit: Payer: Self-pay

## 2022-09-14 DIAGNOSIS — R600 Localized edema: Secondary | ICD-10-CM

## 2022-09-14 MED ORDER — FUROSEMIDE 40 MG PO TABS: 20.0000 mg | ORAL_TABLET | ORAL | 0 refills | Status: DC | PRN

## 2022-09-14 NOTE — Telephone Encounter (Signed)
Rx for lasix 40mg  once daily as needed for edema sent to pharmacy.   Recommend that she come in for lab visit if she takes for 5 days or more

## 2022-09-14 NOTE — Telephone Encounter (Signed)
Patient called and stated she is having trouble with her legs and feet swelling, skin feels tight. Patent requesting furosemide (LASIX) 40 MG tablet for this. Please advise.  Patient has not had this medication in a few years.    Patients callback # [phone]    Chief Complaint: Swelling to feet, ankle. "I have had this in the past and Dr. Sullivan Lone had me take Lasix." Declines OV. "I have an appointment in October." Requests refill on Lasix until appointment. Symptoms: Above Frequency: 1 month ago Pertinent Negatives: Patient denies any SOB or chest Disposition: [] ED /[] Urgent Care (no appt availability in office) / [] Appointment(In office/virtual)/ []  [facility]/ [] Home Care/ [x] Refused Recommended Disposition /[] [facility]/ []  Follow-up with PCP Additional Notes: Please advise pt.   Reason for Disposition  [1] MILD swelling of both ankles (i.e., pedal edema) AND [2] new-onset or worsening  Answer Assessment - Initial Assessment Questions 1. ONSET: "When did the swelling start?" (e.g., minutes, hours, days)     1 Month 2. LOCATION: "What part of the leg is swollen?"  "Are both legs swollen or just one leg?"     Both feet and ankles 3. SEVERITY: "How bad is the swelling?" (e.g., localized; mild, moderate, severe)   - Localized: Small area of swelling localized to one leg.   - MILD pedal edema: Swelling limited to foot and ankle, pitting edema < 1/4 inch (6 mm) deep, rest and elevation eliminate most or all swelling.   - MODERATE edema: Swelling of lower leg to knee, pitting edema > 1/4 inch (6 mm) deep, rest and elevation only partially reduce swelling.   - SEVERE edema: Swelling extends above knee, facial or hand swelling present.      Mild 4. REDNESS: "Does the swelling look red or infected?"     No 5. PAIN: "Is the swelling painful to touch?" If Yes, ask: "How painful is it?"   (Scale 1-10; mild, moderate or severe)     Feels tight 6. FEVER: "Do you  have a fever?" If Yes, ask: "What is it, how was it measured, and when did it start?"      No 7. CAUSE: "What do you think is causing the leg swelling?"     Has had in past 8. MEDICAL HISTORY: "Do you have a history of blood clots (e.g., DVT), cancer, heart failure, kidney disease, or liver failure?"     No 9. RECURRENT SYMPTOM: "Have you had leg swelling before?" If Yes, ask: "When was the last time?" "What happened that time?"     Yes 10. OTHER SYMPTOMS: "Do you have any other symptoms?" (e.g., chest pain, difficulty breathing)       No 11. PREGNANCY: "Is there any chance you are pregnant?" "When was your last menstrual period?"       No  Protocols used: Leg Swelling and Edema-A-AH

## 2022-09-17 ENCOUNTER — Telehealth: Payer: Self-pay

## 2022-09-17 DIAGNOSIS — R600 Localized edema: Secondary | ICD-10-CM

## 2022-09-17 MED ORDER — FUROSEMIDE 40 MG PO TABS: 40.0000 mg | ORAL_TABLET | ORAL | 0 refills | Status: DC | PRN

## 2022-09-17 NOTE — Telephone Encounter (Signed)
Copied from CRM [id number]. Topic: General - Other >> Sep 14, 2022  5:03 PM Everette C wrote: Reason for CRM: Cindy Hester with the patient's pharmacy has called for clarity regarding the frequency of the patient's prescription for furosemide (LASIX) 40 MG tablet [[id number]]  Please contact further when possible

## 2022-09-17 NOTE — Telephone Encounter (Signed)
Attempted to return call to discuss prescription but unable to wait due to patient schedule.   Please advise representative that patient's lasix prescription is should be prescribed as needed 40mg  once daily for leg edema.   Updated prescription sent to pharmacy   Dr. Roxan Hockey

## 2022-09-17 NOTE — Addendum Note (Signed)
Addended by: Bing Neighbors on: 09/17/2022 03:31 PM   Modules accepted: Orders

## 2022-09-18 NOTE — Telephone Encounter (Signed)
LMTCB-Ok for [facility] Nurse to give providers message

## 2022-09-20 DIAGNOSIS — J3081 Allergic rhinitis due to animal (cat) (dog) hair and dander: Secondary | ICD-10-CM | POA: Diagnosis not present

## 2022-09-20 DIAGNOSIS — J3089 Other allergic rhinitis: Secondary | ICD-10-CM | POA: Diagnosis not present

## 2022-09-20 DIAGNOSIS — J301 Allergic rhinitis due to pollen: Secondary | ICD-10-CM | POA: Diagnosis not present

## 2022-09-21 NOTE — Telephone Encounter (Signed)
Called patient. No answer. Call cannot get completed. This is the 2nd attempt with no answer.

## 2022-09-28 DIAGNOSIS — J301 Allergic rhinitis due to pollen: Secondary | ICD-10-CM | POA: Diagnosis not present

## 2022-09-28 DIAGNOSIS — J3089 Other allergic rhinitis: Secondary | ICD-10-CM | POA: Diagnosis not present

## 2022-09-28 DIAGNOSIS — J3081 Allergic rhinitis due to animal (cat) (dog) hair and dander: Secondary | ICD-10-CM | POA: Diagnosis not present

## 2022-10-03 ENCOUNTER — Ambulatory Visit: Payer: BC Managed Care – PPO | Admitting: Dermatology

## 2022-10-03 ENCOUNTER — Encounter: Payer: Self-pay | Admitting: Dermatology

## 2022-10-03 VITALS — BP 123/61 | HR 75

## 2022-10-03 DIAGNOSIS — L820 Inflamed seborrheic keratosis: Secondary | ICD-10-CM

## 2022-10-03 DIAGNOSIS — L988 Other specified disorders of the skin and subcutaneous tissue: Secondary | ICD-10-CM

## 2022-10-03 NOTE — Progress Notes (Signed)
Follow-Up Visit   Subjective  Cindy Hester is a 77 y.o. female who presents for the following: Botox for facial elastosis and filler at mid face Also reports a scab at scalp would like checked    The following portions of the chart were reviewed this encounter and updated as appropriate: medications, allergies, medical history  Review of Systems:  No other skin or systemic complaints except as noted in HPI or Assessment and Plan.  Objective  Well appearing patient in no apparent distress; mood and affect are within normal limits.  A focused examination was performed of the face.  Relevant physical exam findings are noted in the Assessment and Plan.   Injection map photo                                     Head - Anterior (Face) Rhytides and volume loss.   scalp x 1 Erythematous stuck-on, waxy papule or plaque    Assessment & Plan   Elastosis of skin Head - Anterior (Face)  Filler today 1 syringe of Restylane Refyne to nasolabial folds, marionette lines, and chin  Lot: [id number] Exp: 10/06/2023  Discussed could treat mid face with a 1 syringe Voluma $700 Patient deferred increasing today  Filling material injection - Head - Anterior (Face) Prior to the procedure, the patient's past medical history, allergies and the rare but potential risks and complications were reviewed with the patient and a signed consent was obtained. Pre and post-treatment care was discussed and instructions provided.  Risks including vascular occlusion were discussed.   Location: nasolabial folds, marionette lines, and chin  Filler Type: Restylane refyne  Lot: [id number]  Exp: 10/06/2023   Procedure: The area was prepped thoroughly with Puracyn. After introducing the needle into the desired treatment area, the syringe plunger was drawn back to ensure there was no flash of blood prior to injecting the filler in order to minimize risk of intravascular injection and  vascular occlusion. After injection of the filler, the treated areas were cleansed and iced to reduce swelling. Post-treatment instructions were reviewed with the patient.       Patient tolerated the procedure well. The patient will call with any problems, questions or concerns prior to their next appointment.   Inflamed seborrheic keratosis scalp x 1  Symptomatic, irritating, patient would like treated.  Destruction of lesion - scalp x 1 Complexity: simple   Destruction method: cryotherapy   Informed consent: discussed and consent obtained   Timeout:  patient name, date of birth, surgical site, and procedure verified Lesion destroyed using liquid nitrogen: Yes   Region frozen until ice ball extended beyond lesion: Yes   Outcome: patient tolerated procedure well with no complications   Post-procedure details: wound care instructions given     Facial Elastosis  Location: See attached image  Informed consent: Discussed risks (infection, pain, bleeding, bruising, swelling, allergic reaction, paralysis of nearby muscles, eyelid droop, double vision, neck weakness, difficulty breathing, headache, undesirable cosmetic result, and need for additional treatment) and benefits of the procedure, as well as the alternatives.  Informed consent was obtained.  Preparation: The area was cleansed with alcohol.  Procedure Details:  Botox was injected into the dermis with a 30-gauge needle. Pressure applied to any bleeding. Ice packs offered for swelling.  Lot Number:  [id number] Expiration:  10/2024  Total Units Injected:  48   Plan: Tylenol may be used for  headache.  Allow 2 weeks before returning to clinic for additional dosing as needed. Patient will call for any problems.  Return for 3 - 4 month botox and filler .  IAsher Muir, CMA, am acting as scribe for Armida Sans, MD.   Documentation: I have reviewed the above documentation for accuracy and completeness, and I agree with the  above.  Armida Sans, MD

## 2022-10-03 NOTE — Patient Instructions (Addendum)

## 2022-10-05 ENCOUNTER — Encounter: Payer: Self-pay | Admitting: Dermatology

## 2022-10-23 DIAGNOSIS — J3089 Other allergic rhinitis: Secondary | ICD-10-CM | POA: Diagnosis not present

## 2022-10-23 DIAGNOSIS — J301 Allergic rhinitis due to pollen: Secondary | ICD-10-CM | POA: Diagnosis not present

## 2022-10-23 DIAGNOSIS — J3081 Allergic rhinitis due to animal (cat) (dog) hair and dander: Secondary | ICD-10-CM | POA: Diagnosis not present

## 2022-10-29 ENCOUNTER — Ambulatory Visit: Payer: BC Managed Care – PPO | Admitting: Family Medicine

## 2022-11-02 DIAGNOSIS — J301 Allergic rhinitis due to pollen: Secondary | ICD-10-CM | POA: Diagnosis not present

## 2022-11-02 DIAGNOSIS — J3081 Allergic rhinitis due to animal (cat) (dog) hair and dander: Secondary | ICD-10-CM | POA: Diagnosis not present

## 2022-11-02 DIAGNOSIS — J3089 Other allergic rhinitis: Secondary | ICD-10-CM | POA: Diagnosis not present

## 2022-11-06 ENCOUNTER — Encounter: Payer: BC Managed Care – PPO | Admitting: Family Medicine

## 2022-11-06 DIAGNOSIS — E1159 Type 2 diabetes mellitus with other circulatory complications: Secondary | ICD-10-CM | POA: Diagnosis not present

## 2022-11-06 DIAGNOSIS — E785 Hyperlipidemia, unspecified: Secondary | ICD-10-CM | POA: Diagnosis not present

## 2022-11-06 DIAGNOSIS — Z0000 Encounter for general adult medical examination without abnormal findings: Secondary | ICD-10-CM | POA: Insufficient documentation

## 2022-11-06 DIAGNOSIS — E119 Type 2 diabetes mellitus without complications: Secondary | ICD-10-CM | POA: Diagnosis not present

## 2022-11-06 DIAGNOSIS — E1169 Type 2 diabetes mellitus with other specified complication: Secondary | ICD-10-CM | POA: Diagnosis not present

## 2022-11-06 NOTE — Progress Notes (Unsigned)
Complete physical exam   Patient: Cindy Hester   DOB: 12-03-1945   77 y.o. Female  MRN: [id number] Visit Date: 11/07/2022  Today's healthcare provider: Ronnald Ramp, MD   No chief complaint on file.  Subjective    Cindy Hester is a 77 y.o. female who presents today for a complete physical exam.   She reports consuming a {diet types:17450} diet.   {Exercise:19826}   She generally feels {well/fairly well/poorly:18703}.   She reports sleeping {well/fairly well/poorly:18703}.    She {does/does not:200015} have additional problems to discuss today.   Discussed the use of AI scribe software for clinical note transcription with the patient, who gave verbal consent to proceed.  History of Present Illness             Past Medical History:  Diagnosis Date   1st degree AV block    Arthritis    right shoulder blade, left thumb   Carotid arterial disease (HCC)    a. 10/2020 U/S: RICA 16-49%, LICA 16-49%. Antegrade bilat vertebral flow.   Chronic venous insufficiency    CKD (chronic kidney disease), stage III (HCC)    Diabetes mellitus without complication (HCC)    GERD (gastroesophageal reflux disease)    Heart murmur    a. 12/2017 Echo: EF 50-55%, mild conc LVH, GrI DD, Trace MR/TR.   History of esophagitis    History of gastritis    History of stress test    a. 12/2017 MV: HTN response to exercise. EF 68%. Small, mild, reversible inferior apical defect-->Low risk.   HLD (hyperlipidemia)    Hypertension    Lymphedema    legs   Sleep apnea    a. Uses CPAP   Past Surgical History:  Procedure Laterality Date   BREAST CYST EXCISION Left    removed two times   CESAREAN SECTION  1986   COLONOSCOPY N/A 07/17/2021   Procedure: COLONOSCOPY;  Surgeon: Jaynie Collins, DO;  Location: [facility] END[facility]COPY;  Service: Gastroenterology;  Laterality: N/A;  DM   CYST EXCISION     from left breast   ESOPHAGOGASTRODUODENOSCOPY (EGD) WITH PROPOFOL N/A 12/12/2015    gastritis, LA Grade A reflux esophagitis ESOPHAGOGASTRODUODENOSCOPY (EGD) WITH PROPOFOL;  Surgeon: Scot Jun, MD;  Location: [facility] ENDOSCOPY;  Service: Endoscopy;  Laterality: N/A;  Diabetic   HAMMER TOE SURGERY Bilateral 05/30/2016   Procedure: HAMMER TOE CORRECTION RIGHT 2ND, 3RD, 4TH, 5TH TOES LEFT 5TH TOE;  Surgeon: Gwyneth Revels, DPM;  Location: [facility];  Service: Podiatry;  Laterality: Bilateral;   HEEL SPUR SURGERY Right 2011   NASAL SINUS SURGERY     TONSILLECTOMY     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   Procedure: TOTAL HIP ARTHROPLASTY;  Surgeon: Donato Heinz, MD;  Location: [facility] ORS;  Service: Orthopedics;  Laterality: Left;   Social History   Socioeconomic History   Marital status: Single    Spouse name: Not on file   Number of children: 1   Years of education: master's   Highest education level: Not on file  Occupational History   Occupation: Hospital doctor: [employer]  Tobacco Use   Smoking status: Never   Smokeless tobacco: Never  Vaping Use   Vaping status: Never Used  Substance and Sexual Activity   Alcohol use: Yes    Comment: occ   Drug use: No   Sexual activity: Not on file  Other Topics Concern  Not on file  Social History Narrative   Not on file   Social Determinants of Health   Financial Resource Strain: Not on file  Food Insecurity: Not on file  Transportation Needs: Not on file  Physical Activity: Not on file  Stress: Not on file  Social Connections: Not on file  Intimate Partner Violence: Not on file   Family Status  Relation Name Status   Mother  Deceased at age 21   Father  Deceased at age 53   Brother  Deceased at age 72   Brother  Alive   Pat Aunt  Deceased   Pat Aunt  Deceased   MGM  Deceased   MGF  Deceased   PGM  Deceased   PGF  Deceased   Neg Hx  (Not Specified)  No partnership data on file   Family History  Problem Relation Age of Onset   Stroke Mother     Diabetes Mother    Hypertension Mother    Congestive Heart Failure Father    Hypertension Father    CAD Father    Glaucoma Father    Hyperlipidemia Brother    Lung disease Brother 81   Heart disease Brother    Diabetes Brother    Breast cancer Paternal Aunt    Breast cancer Paternal Aunt    Stomach cancer Neg Hx    Colon cancer Neg Hx    Allergies  Allergen Reactions   Tape     Most tapes cause redness.  Paper tape and tegaderm are OK.     Medications: Outpatient Medications Prior to Visit  Medication Sig   acetaminophen (TYLENOL) 500 MG tablet Take 1,000 mg by mouth daily as needed. Will alternate with 650 mg, 2 tabs daily when pain increased   amLODipine (NORVASC) 10 MG tablet TAKE 1 TABLET(10 MG) BY MOUTH DAILY   aspirin 81 MG chewable tablet Chew 81 mg by mouth daily.   azelastine (ASTELIN) 0.1 % nasal spray SMARTSIG:1-2 Spray(s) Both Nares Twice Daily   BIOTIN 5000 PO Take by mouth daily.   cetirizine (ZYRTEC) 10 MG tablet Take 10 mg by mouth daily.   cholecalciferol (VITAMIN D) 1000 UNITS tablet Take 5,000 Units by mouth.  Twice a week Mon, Fri   docusate sodium (COLACE) 100 MG capsule Take 100 mg by mouth 2 (two) times daily.   empagliflozin (JARDIANCE) 25 MG TABS tablet Take 25 mg by mouth daily.   EPINEPHrine 0.3 mg/0.3 mL IJ SOAJ injection INJ UTD   ezetimibe (ZETIA) 10 MG tablet TAKE 1 TABLET(10 MG) BY MOUTH DAILY AFTER SUPPER   FIBER SELECT GUMMIES PO Take by mouth.   furosemide (LASIX) 40 MG tablet Take 1 tablet (40 mg total) by mouth as needed for edema.   GLUCOSAMINE-CHONDROITIN PO Take 2,000 mg by mouth daily.   lactulose (CHRONULAC) 10 GM/15ML solution    metoprolol (TOPROL-XL) 200 MG 24 hr tablet Take 1 tablet (200 mg total) by mouth daily.   montelukast (SINGULAIR) 10 MG tablet TAKE 1 TABLET BY MOUTH DAILY   Multiple Vitamin (MULTIVITAMIN) tablet Take 1 tablet by mouth daily.   NON FORMULARY CPAP   OZEMPIC, 0.25 OR 0.5 MG/DOSE, 2 MG/1.5ML SOPN INJ 0.5 MG  [place] ONCE A WEEK   pantoprazole (PROTONIX) 40 MG tablet Take 1 tablet (40 mg total) by mouth daily.   rosuvastatin (CRESTOR) 20 MG tablet TAKE 1 TABLET(20 MG) BY MOUTH AT BEDTIME   spironolactone (ALDACTONE) 25 MG tablet TAKE 1 TABLET BY MOUTH  EVERY DAY   valACYclovir (VALTREX) 500 MG tablet TAKE 1 TABLET BY MOUTH TWICE DAILY AS NEEDED   valsartan-hydrochlorothiazide (DIOVAN-HCT) 160-25 MG tablet TAKE 1 TABLET BY MOUTH EVERY MORNING   vitamin B-12 (CYANOCOBALAMIN) 1000 MCG tablet Take 2,500 mcg by mouth 2 (two) times a week. Mon, Fri   No facility-administered medications prior to visit.    Review of Systems  {Insert previous labs (optional):23779} {See past labs  Heme  Chem  Endocrine  Serology  Results Review (optional):1}  Objective    There were no vitals taken for this visit. {Insert last BP/Wt (optional):23777}{See vitals history (optional):1}    Physical Exam  ***  Last depression screening scores    04/27/2022    8:29 AM 10/27/2021    8:43 AM 03/20/2021    3:25 PM  PHQ 2/9 Scores  PHQ - 2 Score 0 0 1  PHQ- 9 Score 2  3    Last fall risk screening    04/27/2022    8:29 AM  Fall Risk   Falls in the past year? 1  Number falls in past yr: 0  Injury with Fall? 1  Follow up Falls evaluation completed    Last Audit-C alcohol use screening    04/27/2022    8:30 AM  Alcohol Use Disorder Test (AUDIT)  1. How often do you have a drink containing alcohol? 1  2. How many drinks containing alcohol do you have on a typical day when you are drinking? 0  3. How often do you have six or more drinks on one occasion? 0  AUDIT-C Score 1   A score of 3 or more in women, and 4 or more in men indicates increased risk for alcohol abuse, EXCEPT if all of the points are from question 1   No results found for any visits on 11/07/22.  Assessment & Plan    Routine Health Maintenance and Physical Exam  Immunization History  Administered Date(s) Administered   Fluad Quad(high  Dose 65+) 01/18/2021, 10/27/2021   Influenza, High Dose Seasonal PF 10/31/2016, 11/04/2017   Influenza-Unspecified 10/07/2014, 11/13/2018   Moderna Sars-Covid-2 Vaccination 03/23/2019, 04/17/2019, 12/17/2019   Pneumococcal Conjugate-13 04/13/2014   Pneumococcal Polysaccharide-23 11/18/2012   Tdap 07/19/2009, 09/28/2020   Zoster Recombinant(Shingrix) 09/28/2020, 03/01/2021   Zoster, Live 05/26/2009    Health Maintenance  Topic Date Due   INFLUENZA VACCINE  09/06/2022   COVID-19 Vaccine (4 - 2023-24 season) 10/07/2022   Diabetic kidney evaluation - eGFR measurement  10/28/2022   Diabetic kidney evaluation - Urine ACR  10/28/2022   FOOT EXAM  10/28/2022   HEMOGLOBIN A1C  10/28/2022   OPHTHALMOLOGY EXAM  11/15/2022   DTaP/Tdap/Td (3 - Td or Tdap) 09/29/2030   Colonoscopy  07/18/2031   Pneumonia Vaccine 83+ Years old  Completed   DEXA SCAN  Completed   Hepatitis C Screening  Completed   Zoster Vaccines- Shingrix  Completed   HPV VACCINES  Aged Out    Problem List Items Addressed This Visit   None   Assessment and Plan                No follow-ups on file.       Ronnald Ramp, MD  [facility] [phone] (phone) [phone] (fax)  [facility]

## 2022-11-07 ENCOUNTER — Ambulatory Visit (INDEPENDENT_AMBULATORY_CARE_PROVIDER_SITE_OTHER): Payer: BC Managed Care – PPO | Admitting: Family Medicine

## 2022-11-07 ENCOUNTER — Encounter: Payer: Self-pay | Admitting: Family Medicine

## 2022-11-07 VITALS — BP 146/60 | HR 74 | Ht 66.5 in | Wt 180.6 lb

## 2022-11-07 DIAGNOSIS — R600 Localized edema: Secondary | ICD-10-CM

## 2022-11-07 DIAGNOSIS — Z130 Encounter for screening for diseases of the blood and blood-forming organs and certain disorders involving the immune mechanism: Secondary | ICD-10-CM

## 2022-11-07 DIAGNOSIS — E782 Mixed hyperlipidemia: Secondary | ICD-10-CM | POA: Diagnosis not present

## 2022-11-07 DIAGNOSIS — E039 Hypothyroidism, unspecified: Secondary | ICD-10-CM

## 2022-11-07 DIAGNOSIS — Z789 Other specified health status: Secondary | ICD-10-CM

## 2022-11-07 DIAGNOSIS — R2689 Other abnormalities of gait and mobility: Secondary | ICD-10-CM | POA: Diagnosis not present

## 2022-11-07 DIAGNOSIS — I10 Essential (primary) hypertension: Secondary | ICD-10-CM | POA: Diagnosis not present

## 2022-11-07 DIAGNOSIS — Z0001 Encounter for general adult medical examination with abnormal findings: Secondary | ICD-10-CM

## 2022-11-07 DIAGNOSIS — E119 Type 2 diabetes mellitus without complications: Secondary | ICD-10-CM | POA: Diagnosis not present

## 2022-11-07 DIAGNOSIS — E559 Vitamin D deficiency, unspecified: Secondary | ICD-10-CM | POA: Diagnosis not present

## 2022-11-07 DIAGNOSIS — Z0000 Encounter for general adult medical examination without abnormal findings: Secondary | ICD-10-CM

## 2022-11-07 NOTE — Assessment & Plan Note (Signed)
Chronic conditions are stable  Patient was counseled on benefits of regular physical activity with goal of 150 minutes of moderate to vigurous intensity 4 days per week  Patient was counseled to consume well balanced diet of fruits, vegetables, limited saturated fats and limited sugary foods and beverages with emphasis on consuming 6-8 glasses of water daily

## 2022-11-07 NOTE — Patient Instructions (Addendum)
It was a pleasure to see you today!  Thank you for choosing [facility] for your primary care.   Today you were seen for your annual physical  Please review the attached information regarding helpful preventive health topics.   We will follow up with results of labs once they are available.   A referral has been placed on your behalf for physical therapy to help with your balance. Our referral coordination team or the office you will be visiting will contact you within the next 2 weeks.   If you have not received a phone call within 10 business days please let us know so that we can check into this for you.    Please make sure to schedule your next annual physical for one year from today.   Best Wishes,   Dr. Roxan Hockey

## 2022-11-08 DIAGNOSIS — J3081 Allergic rhinitis due to animal (cat) (dog) hair and dander: Secondary | ICD-10-CM | POA: Diagnosis not present

## 2022-11-08 DIAGNOSIS — J301 Allergic rhinitis due to pollen: Secondary | ICD-10-CM | POA: Diagnosis not present

## 2022-11-08 DIAGNOSIS — J3089 Other allergic rhinitis: Secondary | ICD-10-CM | POA: Diagnosis not present

## 2022-11-08 LAB — CMP14+EGFR
ALT: 14 [IU]/L (ref 0–32)
AST: 16 [IU]/L (ref 0–40)
Albumin: 4.4 g/dL (ref 3.8–4.8)
Alkaline Phosphatase: 81 [IU]/L (ref 44–121)
BUN/Creatinine Ratio: 16 (ref 12–28)
BUN: 13 mg/dL (ref 8–27)
Bilirubin Total: 0.5 mg/dL (ref 0.0–1.2)
CO2: 22 mmol/L (ref 20–29)
Calcium: 10 mg/dL (ref 8.7–10.3)
Chloride: 103 mmol/L (ref 96–106)
Creatinine, Ser: 0.82 mg/dL (ref 0.57–1.00)
Globulin, Total: 2.1 g/dL (ref 1.5–4.5)
Glucose: 200 mg/dL — ABNORMAL HIGH (ref 70–99)
Potassium: 4.7 mmol/L (ref 3.5–5.2)
Sodium: 144 mmol/L (ref 134–144)
Total Protein: 6.5 g/dL (ref 6.0–8.5)
eGFR: 75 mL/min/{1.73_m2} (ref >59)

## 2022-11-08 LAB — B12 AND FOLATE PANEL
Folate: >20 ng/mL (ref >3.0)
Vitamin B-12: 749 pg/mL (ref 232–1245)

## 2022-11-08 LAB — CBC
Hematocrit: 44.9 % (ref 34.0–46.6)
Hemoglobin: 14.4 g/dL (ref 11.1–15.9)
MCH: 29.2 pg (ref 26.6–33.0)
MCHC: 32.1 g/dL (ref 31.5–35.7)
MCV: 91 fL (ref 79–97)
Platelets: 267 10*3/uL (ref 150–450)
RBC: 4.93 x10E6/uL (ref 3.77–5.28)
RDW: 12.7 % (ref 11.7–15.4)
WBC: 6.9 10*3/uL (ref 3.4–10.8)

## 2022-11-08 LAB — MICROALBUMIN / CREATININE URINE RATIO
Creatinine, Urine: 64.1 mg/dL
Microalb/Creat Ratio: 7 mg/g{creat} (ref 0–29)
Microalbumin, Urine: 4.5 ug/mL

## 2022-11-08 LAB — TSH+T4F+T3FREE
Free T4: 1.39 ng/dL (ref 0.82–1.77)
T3, Free: 3.3 pg/mL (ref 2.0–4.4)
TSH: 1.44 u[IU]/mL (ref 0.450–4.500)

## 2022-11-08 LAB — VITAMIN D 25 HYDROXY (VIT D DEFICIENCY, FRACTURES): Vit D, 25-Hydroxy: 43.5 ng/mL (ref 30.0–100.0)

## 2022-11-08 LAB — LIPID PANEL
Chol/HDL Ratio: 3.4 {ratio} (ref 0.0–4.4)
Cholesterol, Total: 210 mg/dL — ABNORMAL HIGH (ref 100–199)
HDL: 61 mg/dL (ref >39)
LDL Chol Calc (NIH): 107 mg/dL — ABNORMAL HIGH (ref 0–99)
Triglycerides: 245 mg/dL — ABNORMAL HIGH (ref 0–149)
VLDL Cholesterol Cal: 42 mg/dL — ABNORMAL HIGH (ref 5–40)

## 2022-11-15 DIAGNOSIS — J3089 Other allergic rhinitis: Secondary | ICD-10-CM | POA: Diagnosis not present

## 2022-11-15 DIAGNOSIS — J301 Allergic rhinitis due to pollen: Secondary | ICD-10-CM | POA: Diagnosis not present

## 2022-11-15 DIAGNOSIS — J3081 Allergic rhinitis due to animal (cat) (dog) hair and dander: Secondary | ICD-10-CM | POA: Diagnosis not present

## 2022-11-20 DIAGNOSIS — E119 Type 2 diabetes mellitus without complications: Secondary | ICD-10-CM | POA: Diagnosis not present

## 2022-11-20 DIAGNOSIS — H2513 Age-related nuclear cataract, bilateral: Secondary | ICD-10-CM | POA: Diagnosis not present

## 2022-11-20 LAB — HM DIABETES EYE EXAM

## 2022-11-21 DIAGNOSIS — G4733 Obstructive sleep apnea (adult) (pediatric): Secondary | ICD-10-CM | POA: Diagnosis not present

## 2022-11-22 DIAGNOSIS — J301 Allergic rhinitis due to pollen: Secondary | ICD-10-CM | POA: Diagnosis not present

## 2022-11-22 DIAGNOSIS — J3089 Other allergic rhinitis: Secondary | ICD-10-CM | POA: Diagnosis not present

## 2022-11-22 DIAGNOSIS — J3081 Allergic rhinitis due to animal (cat) (dog) hair and dander: Secondary | ICD-10-CM | POA: Diagnosis not present

## 2022-12-05 ENCOUNTER — Encounter: Payer: Self-pay | Admitting: Physical Therapy

## 2022-12-05 ENCOUNTER — Ambulatory Visit
Payer: BC Managed Care – PPO | Attending: Student in an Organized Health Care Education/Training Program | Admitting: Physical Therapy

## 2022-12-05 ENCOUNTER — Other Ambulatory Visit: Payer: Self-pay

## 2022-12-05 DIAGNOSIS — M6281 Muscle weakness (generalized): Secondary | ICD-10-CM | POA: Diagnosis not present

## 2022-12-05 DIAGNOSIS — R262 Difficulty in walking, not elsewhere classified: Secondary | ICD-10-CM | POA: Diagnosis not present

## 2022-12-05 DIAGNOSIS — R2689 Other abnormalities of gait and mobility: Secondary | ICD-10-CM | POA: Diagnosis not present

## 2022-12-05 DIAGNOSIS — R269 Unspecified abnormalities of gait and mobility: Secondary | ICD-10-CM | POA: Diagnosis not present

## 2022-12-05 DIAGNOSIS — R2681 Unsteadiness on feet: Secondary | ICD-10-CM | POA: Diagnosis not present
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
## 2022-12-07 DIAGNOSIS — J3089 Other allergic rhinitis: Secondary | ICD-10-CM | POA: Diagnosis not present

## 2022-12-07 DIAGNOSIS — J301 Allergic rhinitis due to pollen: Secondary | ICD-10-CM | POA: Diagnosis not present

## 2022-12-07 DIAGNOSIS — J3081 Allergic rhinitis due to animal (cat) (dog) hair and dander: Secondary | ICD-10-CM | POA: Diagnosis not present

## 2022-12-08 ENCOUNTER — Other Ambulatory Visit: Payer: Self-pay | Admitting: Family Medicine

## 2022-12-08 DIAGNOSIS — R600 Localized edema: Secondary | ICD-10-CM

## 2022-12-10 NOTE — Telephone Encounter (Signed)
Requested Prescriptions  Pending Prescriptions Disp Refills   furosemide (LASIX) 40 MG tablet [Pharmacy Med Name: FUROSEMIDE 40MG  TABLETS] 20 tablet 2    Sig: TAKE 1 TABLET BY MOUTH AS NEEDED FOR SWELLING     Cardiovascular:  Diuretics - Loop Failed - 12/08/2022  3:25 AM      Failed - Mg Level in normal range and within 180 days    No results found for: "MG"       Failed - Last BP in normal range    BP Readings from Last 1 Encounters:  11/07/22 (!) 146/60         Passed - K in normal range and within 180 days    Potassium  Date Value Ref Range Status  11/07/2022 4.7 3.5 - 5.2 mmol/L Final         Passed - Ca in normal range and within 180 days    Calcium  Date Value Ref Range Status  11/07/2022 10.0 8.7 - 10.3 mg/dL Final         Passed - Na in normal range and within 180 days    Sodium  Date Value Ref Range Status  11/07/2022 144 134 - 144 mmol/L Final         Passed - Cr in normal range and within 180 days    Creatinine, Ser  Date Value Ref Range Status  11/07/2022 0.82 0.57 - 1.00 mg/dL Final         Passed - Cl in normal range and within 180 days    Chloride  Date Value Ref Range Status  11/07/2022 103 96 - 106 mmol/L Final         Passed - Valid encounter within last 6 months    Recent Outpatient Visits           1 month ago Annual physical exam   [place] [facility] Simmons-Robinson, Tawanna Cooler, MD   7 months ago Essential hypertension, benign   [place] [facility] Simmons-Robinson, Tawanna Cooler, MD   1 year ago Encounter for annual physical exam   [place] Soddy-Daisy, [place], MD   1 year ago Strain of neck muscle, initial encounter   [facility] Alfredia Ferguson, PA-C   2 years ago Annual physical exam   [place] [facility] Bosie Clos, MD       Future Appointments             In 1 week Simmons-Robinson, Tawanna Cooler, MD [facility]

## 2022-12-13 ENCOUNTER — Ambulatory Visit: Payer: BC Managed Care – PPO | Admitting: Physical Therapy

## 2022-12-13 ENCOUNTER — Ambulatory Visit: Payer: BC Managed Care – PPO | Attending: Family Medicine | Admitting: Physical Therapy

## 2022-12-13 DIAGNOSIS — R269 Unspecified abnormalities of gait and mobility: Secondary | ICD-10-CM | POA: Diagnosis not present

## 2022-12-13 DIAGNOSIS — J3089 Other allergic rhinitis: Secondary | ICD-10-CM | POA: Diagnosis not present

## 2022-12-13 DIAGNOSIS — M6281 Muscle weakness (generalized): Secondary | ICD-10-CM | POA: Diagnosis not present

## 2022-12-13 DIAGNOSIS — R2689 Other abnormalities of gait and mobility: Secondary | ICD-10-CM | POA: Diagnosis not present

## 2022-12-13 DIAGNOSIS — R262 Difficulty in walking, not elsewhere classified: Secondary | ICD-10-CM

## 2022-12-13 DIAGNOSIS — R2681 Unsteadiness on feet: Secondary | ICD-10-CM | POA: Diagnosis not present

## 2022-12-13 DIAGNOSIS — J3081 Allergic rhinitis due to animal (cat) (dog) hair and dander: Secondary | ICD-10-CM | POA: Diagnosis not present

## 2022-12-13 DIAGNOSIS — M25552 Pain in left hip: Secondary | ICD-10-CM | POA: Diagnosis not present

## 2022-12-13 DIAGNOSIS — J301 Allergic rhinitis due to pollen: Secondary | ICD-10-CM | POA: Diagnosis not present
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
## 2022-12-14 NOTE — Progress Notes (Unsigned)
Established patient visit   Patient: Cindy Hester   DOB: 1945/08/01   77 y.o. Female  MRN: [id number] Visit Date: 12/17/2022  Today's healthcare provider: Ronnald Ramp, MD   No chief complaint on file.  Subjective       Discussed the use of AI scribe software for clinical note transcription with the patient, who gave verbal consent to proceed.  History of Present Illness             Past Medical History:  Diagnosis Date   1st degree AV block    Arthritis    right shoulder blade, left thumb   Carotid arterial disease (HCC)    a. 10/2020 U/S: RICA 16-49%, LICA 16-49%. Antegrade bilat vertebral flow.   Chronic venous insufficiency    CKD (chronic kidney disease), stage III (HCC)    Diabetes mellitus without complication (HCC)    GERD (gastroesophageal reflux disease)    Heart murmur    a. 12/2017 Echo: EF 50-55%, mild conc LVH, GrI DD, Trace MR/TR.   History of esophagitis    History of gastritis    History of stress test    a. 12/2017 MV: HTN response to exercise. EF 68%. Small, mild, reversible inferior apical defect-->Low risk.   HLD (hyperlipidemia)    Hypertension    Lymphedema    legs   Sleep apnea    a. 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 Will alternate with 650 mg, 2 tabs daily when pain increased   amLODipine (NORVASC) 10 MG tablet TAKE 1 TABLET(10 MG) BY MOUTH DAILY   aspirin 81 MG chewable tablet Chew 81 mg by mouth daily.   azelastine (ASTELIN) 0.1 % nasal spray SMARTSIG:1-2 Spray(s) Both Nares Twice Daily   BIOTIN 5000 PO Take by mouth daily.   cetirizine (ZYRTEC) 10 MG tablet Take 10 mg by mouth daily.   cholecalciferol (VITAMIN D) 1000 UNITS tablet Take 5,000 Units by mouth.  Twice a week Mon, Fri   docusate sodium (COLACE)  100 MG capsule Take 100 mg by mouth 2 (two) times daily.   empagliflozin (JARDIANCE) 25 MG TABS tablet Take 25 mg by mouth daily.   EPINEPHrine 0.3 mg/0.3 mL IJ SOAJ injection INJ UTD   ezetimibe (ZETIA) 10 MG tablet TAKE 1 TABLET(10 MG) BY MOUTH DAILY AFTER SUPPER   FIBER SELECT GUMMIES PO Take by mouth.   furosemide (LASIX) 40 MG tablet TAKE 1 TABLET BY MOUTH AS NEEDED FOR SWELLING   GLUCOSAMINE-CHONDROITIN PO Take 2,000 mg by mouth daily.   lactulose (CHRONULAC) 10 GM/15ML solution    metoprolol (TOPROL-XL) 200 MG 24 hr tablet Take 1 tablet (200 mg total) by mouth daily.   montelukast (SINGULAIR) 10 MG tablet TAKE 1 TABLET BY MOUTH DAILY   Multiple Vitamin (MULTIVITAMIN) tablet Take 1 tablet by mouth daily.   NON FORMULARY CPAP   pantoprazole (PROTONIX) 40 MG tablet Take 1 tablet (40 mg total) by mouth daily.   rosuvastatin (CRESTOR) 20 MG tablet TAKE 1 TABLET(20 MG) BY MOUTH AT BEDTIME   spironolactone (ALDACTONE) 25 MG tablet TAKE 1 TABLET BY MOUTH EVERY DAY   valACYclovir (VALTREX) 500 MG tablet TAKE 1 TABLET BY MOUTH TWICE DAILY AS NEEDED   valsartan-hydrochlorothiazide (  DIOVAN-HCT) 160-25 MG tablet TAKE 1 TABLET BY MOUTH EVERY MORNING   vitamin B-12 (CYANOCOBALAMIN) 1000 MCG tablet Take 2,500 mcg by mouth 2 (two) times a week. Mon, Fri   No facility-administered medications prior to visit.    Review of Systems  Last metabolic panel Lab Results  Component Value Date   GLUCOSE 200 (H) 11/07/2022   NA 144 11/07/2022   K 4.7 11/07/2022   CL 103 11/07/2022   CO2 22 11/07/2022   BUN 13 11/07/2022   CREATININE 0.82 11/07/2022   EGFR 75 11/07/2022   CALCIUM 10.0 11/07/2022   PROT 6.5 11/07/2022   ALBUMIN 4.4 11/07/2022   LABGLOB 2.1 11/07/2022   AGRATIO 2.3 (H) 10/27/2021   BILITOT 0.5 11/07/2022   ALKPHOS 81 11/07/2022   AST 16 11/07/2022   ALT 14 11/07/2022   ANIONGAP 9 09/29/2021   Last lipids Lab Results  Component Value Date   CHOL 210 (H) 11/07/2022   HDL 61  11/07/2022   LDLCALC 107 (H) 11/07/2022   TRIG 245 (H) 11/07/2022   CHOLHDL 3.4 11/07/2022   Last hemoglobin A1c Lab Results  Component Value Date   HGBA1C 6.7 (A) 04/27/2022   Last thyroid functions Lab Results  Component Value Date   TSH 1.440 11/07/2022   Last vitamin B12 and Folate Lab Results  Component Value Date   VITAMINB12 749 11/07/2022   FOLATE >20.0 11/07/2022     {See past labs  Heme  Chem  Endocrine  Serology  Results Review (optional):1}   Objective    There were no vitals taken for this visit. BP Readings from Last 3 Encounters:  11/07/22 (!) 146/60  10/03/22 123/61  06/06/22 134/63   Wt Readings from Last 3 Encounters:  11/07/22 180 lb 9.6 oz (81.9 kg)  04/27/22 174 lb 12.8 oz (79.3 kg)  11/13/21 168 lb 9.6 oz (76.5 kg)    {See vitals history (optional):1}      Physical Exam  ***  No results found for any visits on 12/17/22.  Assessment & Plan     Problem List Items Addressed This Visit   None   Assessment and Plan              No follow-ups on file.         Ronnald Ramp, MD  [facility] [phone] (phone) [phone] (fax)  [facility]
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
## 2022-12-17 ENCOUNTER — Ambulatory Visit: Payer: BC Managed Care – PPO

## 2022-12-17 ENCOUNTER — Ambulatory Visit (INDEPENDENT_AMBULATORY_CARE_PROVIDER_SITE_OTHER): Payer: BC Managed Care – PPO | Admitting: Family Medicine

## 2022-12-17 ENCOUNTER — Encounter: Payer: Self-pay | Admitting: Family Medicine

## 2022-12-17 VITALS — BP 126/62 | HR 67 | Temp 97.7°F | Resp 18 | Ht 66.5 in | Wt 180.2 lb

## 2022-12-17 DIAGNOSIS — I890 Lymphedema, not elsewhere classified: Secondary | ICD-10-CM

## 2022-12-17 DIAGNOSIS — M6281 Muscle weakness (generalized): Secondary | ICD-10-CM | POA: Diagnosis not present

## 2022-12-17 DIAGNOSIS — I10 Essential (primary) hypertension: Secondary | ICD-10-CM

## 2022-12-17 DIAGNOSIS — B3731 Acute candidiasis of vulva and vagina: Secondary | ICD-10-CM

## 2022-12-17 DIAGNOSIS — R262 Difficulty in walking, not elsewhere classified: Secondary | ICD-10-CM | POA: Diagnosis not present

## 2022-12-17 DIAGNOSIS — G629 Polyneuropathy, unspecified: Secondary | ICD-10-CM | POA: Diagnosis not present

## 2022-12-17 DIAGNOSIS — Z7984 Long term (current) use of oral hypoglycemic drugs: Secondary | ICD-10-CM

## 2022-12-17 DIAGNOSIS — R2689 Other abnormalities of gait and mobility: Secondary | ICD-10-CM | POA: Diagnosis not present

## 2022-12-17 DIAGNOSIS — E119 Type 2 diabetes mellitus without complications: Secondary | ICD-10-CM

## 2022-12-17 DIAGNOSIS — R269 Unspecified abnormalities of gait and mobility: Secondary | ICD-10-CM

## 2022-12-17 DIAGNOSIS — R2681 Unsteadiness on feet: Secondary | ICD-10-CM

## 2022-12-17 DIAGNOSIS — M25552 Pain in left hip: Secondary | ICD-10-CM | POA: Diagnosis not present

## 2022-12-17 MED ORDER — FLUCONAZOLE 150 MG PO TABS: ORAL_TABLET | ORAL | 0 refills | Status: DC

## 2022-12-17 MED ORDER — CELECOXIB 200 MG PO CAPS
200.0000 mg | ORAL_CAPSULE | Freq: Two times a day (BID) | ORAL | 2 refills | Status: DC
Start: 2022-12-17 — End: 2023-01-21

## 2022-12-17 MED ORDER — DULOXETINE HCL 30 MG PO CPEP
30.0000 mg | ORAL_CAPSULE | Freq: Every day | ORAL | 3 refills | Status: DC
Start: 2022-12-17 — End: 2023-02-21

## 2022-12-17 NOTE — Assessment & Plan Note (Signed)
Chronic swelling in lower extremities, particularly left leg. Valve incompetence noted. Lasix not effective. Discussed benefits and limitations of compression stockings and elevation. Considered referral to lymphedema clinic for specialized management. - Consider referral to lymphedema clinic - Encourage use of compression stockings and leg elevation - Discuss potential switch to different diuretic if needed

## 2022-12-17 NOTE — Assessment & Plan Note (Signed)
Elevated blood pressure at 149 mmHg systolic.improved on recheck to 126/62. Currently on amlodipine, metoprolol, valsartan, and hydrochlorothiazide. Discussed impact of physical therapy on blood pressure. Recommended home monitoring to assess trends. - Monitor blood pressure at home - Continue current antihypertensive medications

## 2022-12-17 NOTE — Assessment & Plan Note (Signed)
Significant loss of sensation in feet with burning sensation in right leg from knee to ankle, exacerbated at night. Symptoms suggestive of neuropathy rather than orthopedic issues. Discussed potential benefits and risks of starting duloxetine for neuropathic pain. Further evaluation by neurology or pain specialist may be necessary if symptoms persist. - Start duloxetine 30 mg daily for one week, then increase to 60 mg daily - Consider referral to neurology or pain specialist if symptoms persist

## 2022-12-17 NOTE — Patient Instructions (Signed)
VISIT SUMMARY:  During today's visit, we addressed several of your ongoing health concerns, including elevated blood pressure, neuropathic pain, a dark spot on your foot, persistent vaginal itching, scalp lesions, and leg swelling. We discussed adjustments to your current medications and potential referrals to specialists for further evaluation and management.  YOUR PLAN:  -PERIPHERAL NEUROPATHY: Peripheral neuropathy is a condition where you experience pain, numbness, or tingling in your extremities due to nerve damage. We will start you on duloxetine 30 mg daily for one week, then increase to 60 mg daily. If your symptoms persist, we may refer you to a neurology or pain specialist.  -HYPERTENSION: Hypertension, or high blood pressure, can increase your risk of heart disease and stroke. Your blood pressure is currently elevated at 149 mmHg systolic. Please continue your current medications and monitor your blood pressure at home to assess trends.  -DIABETES MELLITUS: Diabetes mellitus is a condition where your blood sugar levels are too high. Your recent glucose level was 200 mg/dL, and your Z6X increased to 10. Continue taking Mounjaro and monitor your blood glucose levels regularly.  -CHRONIC VENOUS INSUFFICIENCY: Chronic venous insufficiency occurs when the veins in your legs do not allow blood to flow back to your heart properly, causing swelling. We discussed the use of compression stockings and leg elevation. We may refer you to a lymphedema clinic for specialized management.  -VAGINAL CANDIDIASIS: Vaginal candidiasis is a yeast infection that causes itching and discomfort. Since over-the-counter treatments have not been effective, we will prescribe fluconazole 150 mg. Take one tablet, and a second tablet if symptoms persist after three days.  -SCALP LESIONS: The persistent lesions on your scalp may require further evaluation to rule out malignancy. We will refer you to dermatology for an  excisional biopsy to diagnose and remove the lesions.  -GENERAL HEALTH MAINTENANCE: Your routine lab work shows normal kidney, liver, thyroid function, and B12 levels. Hemoglobin and platelets are within normal range. Continue with your routine health maintenance and monitoring.  INSTRUCTIONS:  - Follow up with cardiology for potential referral to lymphedema clinic - Monitor blood pressure and glucose levels at home - Follow up with dermatology for scalp lesions.

## 2022-12-17 NOTE — Assessment & Plan Note (Signed)
Recent glucose level of 200 mg/dL. A1c increased from 6.7 to 10 after discontinuing Ozempic. Recently started on Mounjaro. Discussed importance of monitoring blood glucose levels and potential adjustments to management plan based on trends. - Continue Mounjaro - Monitor blood glucose levels -chronic -managed by endocrinology

## 2022-12-19 ENCOUNTER — Ambulatory Visit: Payer: BC Managed Care – PPO | Admitting: Physical Therapy

## 2022-12-19 DIAGNOSIS — M6281 Muscle weakness (generalized): Secondary | ICD-10-CM

## 2022-12-19 DIAGNOSIS — R2689 Other abnormalities of gait and mobility: Secondary | ICD-10-CM | POA: Diagnosis not present

## 2022-12-19 DIAGNOSIS — M25552 Pain in left hip: Secondary | ICD-10-CM | POA: Diagnosis not present

## 2022-12-19 DIAGNOSIS — R2681 Unsteadiness on feet: Secondary | ICD-10-CM

## 2022-12-19 DIAGNOSIS — R262 Difficulty in walking, not elsewhere classified: Secondary | ICD-10-CM

## 2022-12-19 DIAGNOSIS — R269 Unspecified abnormalities of gait and mobility: Secondary | ICD-10-CM
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
## 2022-12-20 ENCOUNTER — Ambulatory Visit: Payer: BC Managed Care – PPO

## 2022-12-21 DIAGNOSIS — J3081 Allergic rhinitis due to animal (cat) (dog) hair and dander: Secondary | ICD-10-CM | POA: Diagnosis not present

## 2022-12-21 DIAGNOSIS — J3089 Other allergic rhinitis: Secondary | ICD-10-CM | POA: Diagnosis not present

## 2022-12-21 DIAGNOSIS — J301 Allergic rhinitis due to pollen: Secondary | ICD-10-CM | POA: Diagnosis not present

## 2022-12-24 ENCOUNTER — Encounter: Payer: Self-pay | Admitting: Cardiology

## 2022-12-24 ENCOUNTER — Other Ambulatory Visit: Payer: Self-pay | Admitting: *Deleted

## 2022-12-24 ENCOUNTER — Ambulatory Visit: Payer: BC Managed Care – PPO | Attending: Cardiology | Admitting: Cardiology

## 2022-12-24 ENCOUNTER — Ambulatory Visit: Payer: BC Managed Care – PPO | Admitting: Physical Therapy

## 2022-12-24 VITALS — BP 118/62 | HR 58 | Resp 16 | Ht 66.0 in | Wt 178.2 lb

## 2022-12-24 DIAGNOSIS — R001 Bradycardia, unspecified: Secondary | ICD-10-CM

## 2022-12-24 DIAGNOSIS — R2689 Other abnormalities of gait and mobility: Secondary | ICD-10-CM | POA: Diagnosis not present

## 2022-12-24 DIAGNOSIS — R2681 Unsteadiness on feet: Secondary | ICD-10-CM

## 2022-12-24 DIAGNOSIS — E782 Mixed hyperlipidemia: Secondary | ICD-10-CM

## 2022-12-24 DIAGNOSIS — I2510 Atherosclerotic heart disease of native coronary artery without angina pectoris: Secondary | ICD-10-CM

## 2022-12-24 DIAGNOSIS — M48062 Spinal stenosis, lumbar region with neurogenic claudication: Secondary | ICD-10-CM

## 2022-12-24 DIAGNOSIS — I10 Essential (primary) hypertension: Secondary | ICD-10-CM

## 2022-12-24 DIAGNOSIS — R262 Difficulty in walking, not elsewhere classified: Secondary | ICD-10-CM

## 2022-12-24 DIAGNOSIS — M6281 Muscle weakness (generalized): Secondary | ICD-10-CM

## 2022-12-24 DIAGNOSIS — R269 Unspecified abnormalities of gait and mobility: Secondary | ICD-10-CM | POA: Diagnosis not present

## 2022-12-24 DIAGNOSIS — E1142 Type 2 diabetes mellitus with diabetic polyneuropathy: Secondary | ICD-10-CM

## 2022-12-24 DIAGNOSIS — M25552 Pain in left hip: Secondary | ICD-10-CM | POA: Diagnosis not present

## 2022-12-24 MED ORDER — ROSUVASTATIN CALCIUM 40 MG PO TABS: 40.0000 mg | ORAL_TABLET | Freq: Every day | ORAL | 0 refills | Status: DC

## 2022-12-24 MED ORDER — METOPROLOL SUCCINATE ER 100 MG PO TB24: 100.0000 mg | ORAL_TABLET | Freq: Every day | ORAL | Status: DC

## 2022-12-24 MED ORDER — AMLODIPINE BESYLATE 10 MG PO TABS: 5.0000 mg | ORAL_TABLET | Freq: Every day | ORAL | Status: DC

## 2022-12-24 MED ORDER — AMLODIPINE BESYLATE 5 MG PO TABS: ORAL_TABLET | ORAL | Status: DC
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
## 2022-12-24 NOTE — Patient Instructions (Signed)
Medication Instructions:  Your physician has recommended you make the following change in your medication:  Start rosuvastatin 40 mg by mouth daily  Decrease amlodipine to 5 mg by mouth daily  Decrease metoprolol succinate to 100 mg by mouth daily   *If you need a refill on your cardiac medications before your next appointment, please call your pharmacy*   Lab Work: Your physician recommends that you return for lab work in: 6 week.  Lipid profile.  This will be fasting.   If you have labs (blood work) drawn today and your tests are completely normal, you will receive your results only by: MyChart Message (if you have MyChart) OR A paper copy in the mail If you have any lab test that is abnormal or we need to change your treatment, we will call you to review the results.   Testing/Procedures: Your physician has requested that you have an ankle brachial index (ABI). During this test an ultrasound and blood pressure cuff are used to evaluate the arteries that supply the arms and legs with blood. Allow thirty minutes for this exam. There are no restrictions or special instructions.  Please note: We ask at that you not bring children with you during ultrasound (echo/ vascular) testing. Due to room size and safety concerns, children are not allowed in the ultrasound rooms during exams. Our front office staff cannot provide observation of children in our lobby area while testing is being conducted. An adult accompanying a patient to their appointment will only be allowed in the ultrasound room at the discretion of the ultrasound technician under special circumstances. We apologize for any inconvenience.    Follow-Up: At [facility], you and your health needs are our priority.  As part of our continuing mission to provide you with exceptional heart care, we have created designated Provider Care Teams.  These Care Teams include your primary Cardiologist (physician) and Advanced Practice  Providers (APPs -  Physician Assistants and Nurse Practitioners) who all work together to provide you with the care you need, when you need it.  We recommend signing up for the patient portal called "MyChart".  Sign up information is provided on this After Visit Summary.  MyChart is used to connect with patients for Virtual Visits (Telemedicine).  Patients are able to view lab/test results, encounter notes, upcoming appointments, etc.  Non-urgent messages can be sent to your provider as well.   To learn more about what you can do with MyChart, go to [email or web address].    Your next appointment:   2 month(s)  Provider:   Yates Decamp, MD or APP    Other Instructions
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
## 2022-12-25 ENCOUNTER — Ambulatory Visit: Payer: BC Managed Care – PPO

## 2022-12-25 DIAGNOSIS — J301 Allergic rhinitis due to pollen: Secondary | ICD-10-CM | POA: Diagnosis not present

## 2022-12-25 DIAGNOSIS — J3081 Allergic rhinitis due to animal (cat) (dog) hair and dander: Secondary | ICD-10-CM | POA: Diagnosis not present

## 2022-12-25 DIAGNOSIS — J3089 Other allergic rhinitis: Secondary | ICD-10-CM | POA: Diagnosis not present

## 2022-12-26 ENCOUNTER — Ambulatory Visit: Payer: BC Managed Care – PPO | Admitting: Physical Therapy

## 2022-12-31 ENCOUNTER — Ambulatory Visit: Payer: BC Managed Care – PPO

## 2022-12-31 DIAGNOSIS — M6281 Muscle weakness (generalized): Secondary | ICD-10-CM

## 2022-12-31 DIAGNOSIS — R269 Unspecified abnormalities of gait and mobility: Secondary | ICD-10-CM

## 2022-12-31 DIAGNOSIS — R2681 Unsteadiness on feet: Secondary | ICD-10-CM

## 2022-12-31 DIAGNOSIS — R2689 Other abnormalities of gait and mobility: Secondary | ICD-10-CM

## 2022-12-31 DIAGNOSIS — R262 Difficulty in walking, not elsewhere classified: Secondary | ICD-10-CM | POA: Diagnosis not present

## 2022-12-31 DIAGNOSIS — M25552 Pain in left hip: Secondary | ICD-10-CM | POA: Diagnosis not present
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
## 2023-01-02 ENCOUNTER — Ambulatory Visit: Payer: BC Managed Care – PPO

## 2023-01-02 DIAGNOSIS — M6281 Muscle weakness (generalized): Secondary | ICD-10-CM | POA: Diagnosis not present

## 2023-01-02 DIAGNOSIS — R2689 Other abnormalities of gait and mobility: Secondary | ICD-10-CM | POA: Diagnosis not present

## 2023-01-02 DIAGNOSIS — R262 Difficulty in walking, not elsewhere classified: Secondary | ICD-10-CM | POA: Diagnosis not present

## 2023-01-02 DIAGNOSIS — M25552 Pain in left hip: Secondary | ICD-10-CM | POA: Diagnosis not present

## 2023-01-02 DIAGNOSIS — R269 Unspecified abnormalities of gait and mobility: Secondary | ICD-10-CM

## 2023-01-02 DIAGNOSIS — R2681 Unsteadiness on feet: Secondary | ICD-10-CM
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
## 2023-01-07 ENCOUNTER — Ambulatory Visit: Payer: BC Managed Care – PPO | Attending: Family Medicine | Admitting: Physical Therapy

## 2023-01-07 ENCOUNTER — Ambulatory Visit (HOSPITAL_COMMUNITY)
Admission: RE | Admit: 2023-01-07 | Discharge: 2023-01-07 | Disposition: A | Discharge status: Home / Self Care | Payer: BC Managed Care – PPO | Source: Ambulatory Visit | Attending: Cardiology | Admitting: Cardiology

## 2023-01-07 ENCOUNTER — Encounter: Payer: Self-pay | Admitting: Physical Therapy

## 2023-01-07 DIAGNOSIS — R2681 Unsteadiness on feet: Secondary | ICD-10-CM | POA: Diagnosis not present

## 2023-01-07 DIAGNOSIS — M25552 Pain in left hip: Secondary | ICD-10-CM | POA: Insufficient documentation

## 2023-01-07 DIAGNOSIS — E1142 Type 2 diabetes mellitus with diabetic polyneuropathy: Secondary | ICD-10-CM | POA: Diagnosis not present

## 2023-01-07 DIAGNOSIS — M6281 Muscle weakness (generalized): Secondary | ICD-10-CM | POA: Insufficient documentation

## 2023-01-07 DIAGNOSIS — R269 Unspecified abnormalities of gait and mobility: Secondary | ICD-10-CM | POA: Insufficient documentation

## 2023-01-07 DIAGNOSIS — R262 Difficulty in walking, not elsewhere classified: Secondary | ICD-10-CM | POA: Insufficient documentation

## 2023-01-07 DIAGNOSIS — R2689 Other abnormalities of gait and mobility: Secondary | ICD-10-CM | POA: Diagnosis not present

## 2023-01-07 DIAGNOSIS — M48062 Spinal stenosis, lumbar region with neurogenic claudication: Secondary | ICD-10-CM | POA: Diagnosis not present
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
## 2023-01-08 DIAGNOSIS — J301 Allergic rhinitis due to pollen: Secondary | ICD-10-CM | POA: Diagnosis not present

## 2023-01-08 DIAGNOSIS — J3089 Other allergic rhinitis: Secondary | ICD-10-CM | POA: Diagnosis not present

## 2023-01-08 LAB — VAS US ABI WITH/WO TBI
Left ABI: 1.17
Right ABI: 1.18

## 2023-01-09 ENCOUNTER — Encounter: Payer: Self-pay | Admitting: Physical Therapy

## 2023-01-09 ENCOUNTER — Ambulatory Visit: Payer: BC Managed Care – PPO | Admitting: Physical Therapy

## 2023-01-09 DIAGNOSIS — R2689 Other abnormalities of gait and mobility: Secondary | ICD-10-CM

## 2023-01-09 DIAGNOSIS — R262 Difficulty in walking, not elsewhere classified: Secondary | ICD-10-CM

## 2023-01-09 DIAGNOSIS — M6281 Muscle weakness (generalized): Secondary | ICD-10-CM

## 2023-01-09 DIAGNOSIS — R2681 Unsteadiness on feet: Secondary | ICD-10-CM

## 2023-01-09 DIAGNOSIS — R269 Unspecified abnormalities of gait and mobility: Secondary | ICD-10-CM

## 2023-01-09 DIAGNOSIS — M25552 Pain in left hip: Secondary | ICD-10-CM | POA: Diagnosis not present

## 2023-01-09 NOTE — Progress Notes (Signed)
Normal resting ABI bilateral, do not suspect significant peripheral arterial disease.  No change from 2019 studies.
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
## 2023-01-10 ENCOUNTER — Ambulatory Visit: Payer: BC Managed Care – PPO | Admitting: Physical Therapy

## 2023-01-14 ENCOUNTER — Ambulatory Visit: Payer: BC Managed Care – PPO | Admitting: Physical Therapy

## 2023-01-14 DIAGNOSIS — M25552 Pain in left hip: Secondary | ICD-10-CM | POA: Diagnosis not present

## 2023-01-14 DIAGNOSIS — R2681 Unsteadiness on feet: Secondary | ICD-10-CM | POA: Diagnosis not present

## 2023-01-14 DIAGNOSIS — R269 Unspecified abnormalities of gait and mobility: Secondary | ICD-10-CM

## 2023-01-14 DIAGNOSIS — R2689 Other abnormalities of gait and mobility: Secondary | ICD-10-CM | POA: Diagnosis not present

## 2023-01-14 DIAGNOSIS — R262 Difficulty in walking, not elsewhere classified: Secondary | ICD-10-CM | POA: Diagnosis not present

## 2023-01-14 DIAGNOSIS — M6281 Muscle weakness (generalized): Secondary | ICD-10-CM | POA: Diagnosis not present
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
## 2023-01-16 ENCOUNTER — Ambulatory Visit: Payer: BC Managed Care – PPO | Admitting: Physical Therapy

## 2023-01-16 ENCOUNTER — Encounter: Payer: Self-pay | Admitting: Physical Therapy

## 2023-01-16 DIAGNOSIS — R2681 Unsteadiness on feet: Secondary | ICD-10-CM

## 2023-01-16 DIAGNOSIS — R269 Unspecified abnormalities of gait and mobility: Secondary | ICD-10-CM

## 2023-01-16 DIAGNOSIS — M25552 Pain in left hip: Secondary | ICD-10-CM | POA: Diagnosis not present

## 2023-01-16 DIAGNOSIS — Z124 Encounter for screening for malignant neoplasm of cervix: Secondary | ICD-10-CM | POA: Diagnosis not present

## 2023-01-16 DIAGNOSIS — M6281 Muscle weakness (generalized): Secondary | ICD-10-CM

## 2023-01-16 DIAGNOSIS — Z01419 Encounter for gynecological examination (general) (routine) without abnormal findings: Secondary | ICD-10-CM | POA: Diagnosis not present

## 2023-01-16 DIAGNOSIS — R2689 Other abnormalities of gait and mobility: Secondary | ICD-10-CM | POA: Diagnosis not present

## 2023-01-16 DIAGNOSIS — Z87410 Personal history of cervical dysplasia: Secondary | ICD-10-CM | POA: Diagnosis not present

## 2023-01-16 DIAGNOSIS — R262 Difficulty in walking, not elsewhere classified: Secondary | ICD-10-CM
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
## 2023-01-17 ENCOUNTER — Other Ambulatory Visit: Payer: Self-pay | Admitting: Obstetrics and Gynecology

## 2023-01-17 ENCOUNTER — Ambulatory Visit: Payer: BC Managed Care – PPO

## 2023-01-17 DIAGNOSIS — Z1231 Encounter for screening mammogram for malignant neoplasm of breast: Secondary | ICD-10-CM

## 2023-01-18 DIAGNOSIS — J3081 Allergic rhinitis due to animal (cat) (dog) hair and dander: Secondary | ICD-10-CM | POA: Diagnosis not present

## 2023-01-18 DIAGNOSIS — J301 Allergic rhinitis due to pollen: Secondary | ICD-10-CM | POA: Diagnosis not present

## 2023-01-18 DIAGNOSIS — J3089 Other allergic rhinitis: Secondary | ICD-10-CM | POA: Diagnosis not present

## 2023-01-19 ENCOUNTER — Other Ambulatory Visit: Payer: Self-pay | Admitting: Family Medicine

## 2023-01-19 DIAGNOSIS — G629 Polyneuropathy, unspecified: Secondary | ICD-10-CM

## 2023-01-21 ENCOUNTER — Ambulatory Visit: Payer: BC Managed Care – PPO | Admitting: Physical Therapy

## 2023-01-21 DIAGNOSIS — M25552 Pain in left hip: Secondary | ICD-10-CM | POA: Diagnosis not present

## 2023-01-21 DIAGNOSIS — R262 Difficulty in walking, not elsewhere classified: Secondary | ICD-10-CM

## 2023-01-21 DIAGNOSIS — M6281 Muscle weakness (generalized): Secondary | ICD-10-CM

## 2023-01-21 DIAGNOSIS — R2689 Other abnormalities of gait and mobility: Secondary | ICD-10-CM

## 2023-01-21 DIAGNOSIS — R2681 Unsteadiness on feet: Secondary | ICD-10-CM

## 2023-01-21 DIAGNOSIS — R269 Unspecified abnormalities of gait and mobility: Secondary | ICD-10-CM
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
## 2023-01-22 ENCOUNTER — Encounter: Payer: Self-pay | Admitting: Dermatology

## 2023-01-22 ENCOUNTER — Ambulatory Visit: Payer: BC Managed Care – PPO | Admitting: Dermatology

## 2023-01-22 DIAGNOSIS — L649 Androgenic alopecia, unspecified: Secondary | ICD-10-CM | POA: Diagnosis not present

## 2023-01-22 DIAGNOSIS — D044 Carcinoma in situ of skin of scalp and neck: Secondary | ICD-10-CM

## 2023-01-22 DIAGNOSIS — D492 Neoplasm of unspecified behavior of bone, soft tissue, and skin: Secondary | ICD-10-CM

## 2023-01-22 DIAGNOSIS — C4442 Squamous cell carcinoma of skin of scalp and neck: Secondary | ICD-10-CM

## 2023-01-22 DIAGNOSIS — L658 Other specified nonscarring hair loss: Secondary | ICD-10-CM

## 2023-01-22 DIAGNOSIS — C4492 Squamous cell carcinoma of skin, unspecified: Secondary | ICD-10-CM

## 2023-01-22 DIAGNOSIS — Z79899 Other long term (current) drug therapy: Secondary | ICD-10-CM

## 2023-01-22 DIAGNOSIS — D485 Neoplasm of uncertain behavior of skin: Secondary | ICD-10-CM

## 2023-01-22 HISTORY — DX: Squamous cell carcinoma of skin, unspecified: C44.92

## 2023-01-22 NOTE — Progress Notes (Signed)
Follow-Up Visit   Subjective  Cindy Hester is a 77 y.o. female who presents for the following: ISK follow up and new spot at scalp/ ISK's treated with LN2 in the past. Patient also concerned about thinning hair.  The patient has spots, moles and lesions to be evaluated, some may be new or changing and the patient may have concern these could be cancer.   The following portions of the chart were reviewed this encounter and updated as appropriate: medications, allergies, medical history  Review of Systems:  No other skin or systemic complaints except as noted in HPI or Assessment and Plan.  Objective  Well appearing patient in no apparent distress; mood and affect are within normal limits.   A focused examination was performed of the following areas: scalp  Relevant exam findings are noted in the Assessment and Plan.  Right posterior parietal 5 mm keratotic papule  Vertex scalp 5 mm keratotic papule SK vs AK vs SCC  Assessment & Plan   ANDROGENETIC ALOPECIA (FEMALE PATTERN HAIR LOSS) Exam: Diffuse thinning of the crown and widening of the midline part with retention of the frontal hairline  Chronic flred  Female Androgenic Alopecia is a chronic condition related to genetics and/or hormonal changes.  In women androgenetic alopecia is commonly associated with menopause but may occur any time after puberty.  It causes hair thinning primarily on the crown with widening of the part and temporal hairline recession.  Can use OTC Rogaine (minoxidil) 5% solution/foam as directed.  Oral treatments in female patients who have no contraindication may include : - Low dose oral minoxidil 1.25 - 5mg  daily - Spironolactone 50 - 100mg  bid   Treatment Plan: Recommend minoxidil 5% (Rogaine for men) solution or foam to be applied to the scalp and left in. This should ideally be used twice daily for best results but it helps with hair regrowth when used at least three times per week. Rogaine  initially can cause increased hair shedding for the first few weeks but this will stop with continued use. In studies, people who used minoxidil (Rogaine) for at least 6 months had thicker hair than people who did not. Minoxidil topical (Rogaine) only works as long as it continues to be used. If if it is no longer used then the hair it has been helping to regrow can fall out. Minoxidil topical (Rogaine) can cause increased facial hair growth which can usually be managed easily with a battery-operated hair trimmer. If facial hair growth is bothersome, switching to the 2% women's version can decrease the risk of unwanted facial hair growth. Avoiding oral minoxidil and spironolactone due to known swelling and avoiding meds that may interfere with the heart. Patient sees a cardiologist   Long term medication management.  Patient is using long term (months to years) prescription medication  to control their dermatologic condition.  These medications require periodic monitoring to evaluate for efficacy and side effects and may require periodic laboratory monitoring.    NEOPLASM OF UNCERTAIN BEHAVIOR OF SKIN (2) Right posterior parietal Skin / nail biopsy Type of biopsy: tangential   Informed consent: discussed and consent obtained   Timeout: patient name, date of birth, surgical site, and procedure verified   Procedure prep:  Patient was prepped and draped in usual sterile fashion Prep type:  Isopropyl alcohol Anesthesia: the lesion was anesthetized in a standard fashion   Anesthetic:  1% lidocaine w/ epinephrine 1-100,000 buffered w/ 8.4% NaHCO3 Instrument used: DermaBlade   Hemostasis  achieved with: pressure and aluminum chloride   Outcome: patient tolerated procedure well   Post-procedure details: sterile dressing applied and wound care instructions given   Dressing type: bandage and petrolatum   Specimen 1 - Surgical pathology Differential Diagnosis: SK vs AK vs SCC  Check Margins: No 5 mm  keratotic papule Vertex scalp Skin / nail biopsy Type of biopsy: tangential   Informed consent: discussed and consent obtained   Timeout: patient name, date of birth, surgical site, and procedure verified   Procedure prep:  Patient was prepped and draped in usual sterile fashion Prep type:  Isopropyl alcohol Anesthesia: the lesion was anesthetized in a standard fashion   Anesthetic:  1% lidocaine w/ epinephrine 1-100,000 buffered w/ 8.4% NaHCO3 Instrument used: DermaBlade   Hemostasis achieved with: pressure and aluminum chloride   Outcome: patient tolerated procedure well   Post-procedure details: sterile dressing applied and wound care instructions given   Dressing type: bandage and petrolatum   Specimen 2 - Surgical pathology Differential Diagnosis: SK vs AK vs SCC  Check Margins: No 5 mm keratotic papule   Return for as scheduled.  Anise Salvo, RMA, am acting as scribe for Elie Goody, MD .   Documentation: I have reviewed the above documentation for accuracy and completeness, and I agree with the above.  Elie Goody, MD

## 2023-01-22 NOTE — Patient Instructions (Signed)
Recommend minoxidil 5% (Rogaine for men) solution or foam to be applied to the scalp and left in. This should ideally be used twice daily for best results but it helps with hair regrowth when used at least three times per week. Rogaine initially can cause increased hair shedding for the first few weeks but this will stop with continued use. In studies, people who used minoxidil (Rogaine) for at least 6 months had thicker hair than people who did not. Minoxidil topical (Rogaine) only works as long as it continues to be used. If if it is no longer used then the hair it has been helping to regrow can fall out. Minoxidil topical (Rogaine) can cause increased facial hair growth.  Wound Care Instructions  Cleanse wound gently with soap and water once a day then pat dry with clean gauze. Apply a thin coat of Petrolatum (petroleum jelly, "Vaseline") over the wound (unless you have an allergy to this). We recommend that you use a new, sterile tube of Vaseline. Do not pick or remove scabs. Do not remove the yellow or white "healing tissue" from the base of the wound.  Cover the wound with fresh, clean, nonstick gauze and secure with paper tape. You may use Band-Aids in place of gauze and tape if the wound is small enough, but would recommend trimming much of the tape off as there is often too much. Sometimes Band-Aids can irritate the skin.  You should call the office for your biopsy report after 1 week if you have not already been contacted.  If you experience any problems, such as abnormal amounts of bleeding, swelling, significant bruising, significant pain, or evidence of infection, please call the office immediately.  FOR ADULT SURGERY PATIENTS: If you need something for pain relief you may take 1 extra strength Tylenol (acetaminophen) AND 2 Ibuprofen (200mg  each) together every 4 hours as needed for pain. (do not take these if you are allergic to them or if you have a reason you should not take them.)  Typically, you may only need pain medication for 1 to 3 days.    Due to recent changes in healthcare laws, you may see results of your pathology and/or laboratory studies on MyChart before the doctors have had a chance to review them. We understand that in some cases there may be results that are confusing or concerning to you. Please understand that not all results are received at the same time and often the doctors may need to interpret multiple results in order to provide you with the best plan of care or course of treatment. Therefore, we ask that you please give [place] 2 business days to thoroughly review all your results before contacting the office for clarification. Should we see a critical lab result, you will be contacted sooner.   If You Need Anything After Your Visit  If you have any questions or concerns for your doctor, please call our main line at [phone] and press option 4 to reach your doctor's medical assistant. If no one answers, please leave a voicemail as directed and we will return your call as soon as possible. Messages left after 4 pm will be answered the following business day.   You may also send [place] a message via MyChart. We typically respond to MyChart messages within 1-2 business days.  For prescription refills, please ask your pharmacy to contact our office. Our fax number is [phone].  If you have an urgent issue when the clinic is closed that cannot  wait until the next business day, you can page your doctor at the number below.    Please note that while we do our best to be available for urgent issues outside of office hours, we are not available 24/7.   If you have an urgent issue and are unable to reach [place], you may choose to seek medical care at your doctor's office, retail clinic, urgent care center, or emergency room.  If you have a medical emergency, please immediately call 911 or go to the emergency department.  Pager Numbers  - Dr. Gwen Pounds:  [phone]  - Dr. Roseanne Reno: [phone]  - Dr. Katrinka Blazing: [phone]   In the event of inclement weather, please call our main line at [phone] for an update on the status of any delays or closures.  Dermatology Medication Tips: Please keep the boxes that topical medications come in in order to help keep track of the instructions about where and how to use these. Pharmacies typically print the medication instructions only on the boxes and not directly on the medication tubes.   If your medication is too expensive, please contact our office at [phone] option 4 or send [place] a message through MyChart.   We are unable to tell what your co-pay for medications will be in advance as this is different depending on your insurance coverage. However, we may be able to find a substitute medication at lower cost or fill out paperwork to get insurance to cover a needed medication.   If a prior authorization is required to get your medication covered by your insurance company, please allow [place] 1-2 business days to complete this process.  Drug prices often vary depending on where the prescription is filled and some pharmacies may offer cheaper prices.  The website www.goodrx.com contains coupons for medications through different pharmacies. The prices here do not account for what the cost may be with help from insurance (it may be cheaper with your insurance), but the website can give you the price if you did not use any insurance.  - You can print the associated coupon and take it with your prescription to the pharmacy.  - You may also stop by our office during regular business hours and pick up a GoodRx coupon card.  - If you need your prescription sent electronically to a different pharmacy, notify our office through [facility] or by phone at [phone] option 4.     Si Usted Necesita Algo Despus de Su Visita  Tambin puede enviarnos un mensaje a travs de Clinical cytogeneticist. Por lo general  respondemos a los mensajes de MyChart en el transcurso de 1 a 2 das hbiles.  Para renovar recetas, por favor pida a su farmacia que se ponga en contacto con nuestra oficina. Annie Sable de fax es [place] [phone].  Si tiene un asunto urgente cuando la clnica est cerrada y que no puede esperar hasta el siguiente da hbil, puede llamar/localizar a su doctor(a) al nmero que aparece a continuacin.   Por favor, tenga en cuenta que aunque hacemos todo lo posible para estar disponibles para asuntos urgentes fuera del horario de [place], no estamos disponibles las 24 horas del da, los 7 [address]  [address] de la [place].   Si tiene un problema urgente y no puede comunicarse con nosotros, puede optar por buscar atencin mdica  en el consultorio de su doctor(a), en una clnica privada, en un centro de atencin urgente o en una sala de emergencias.  Si tiene [employer], por favor  llame inmediatamente al 911 o vaya a la sala de emergencias.  Nmeros de bper  - Dr. Gwen Pounds: [phone]  - Dra. Roseanne Reno: [phone]  - Dr. Katrinka Blazing: [phone]   En caso de inclemencias del tiempo, por favor llame a Lacy Duverney principal al [phone] para una actualizacin sobre el [place] de cualquier retraso o cierre.  Consejos para la medicacin en dermatologa: Por favor, guarde las cajas en las que vienen los medicamentos de uso tpico para ayudarle a seguir las instrucciones sobre dnde y cmo usarlos. Las farmacias generalmente imprimen las instrucciones del medicamento slo en las cajas y no directamente en los tubos del [place].   Si su medicamento es muy caro, por favor, pngase en contacto con Rolm Gala llamando al [phone] y presione la opcin 4 o envenos un mensaje a travs de Clinical cytogeneticist.   No podemos decirle cul ser su copago por los medicamentos por adelantado ya que esto es diferente dependiendo de la cobertura de su seguro. Sin embargo, es posible que podamos encontrar un  medicamento sustituto a Audiological scientist un formulario para que el seguro cubra el medicamento que se considera necesario.   Si se requiere una autorizacin previa para que su compaa de seguros [place] su medicamento, por favor permtanos de 1 a 2 das hbiles para completar [address]  Los precios de los medicamentos varan con frecuencia dependiendo del Environmental consultant de dnde se surte la receta y alguna farmacias pueden ofrecer precios ms baratos.  El sitio web www.goodrx.com tiene cupones para medicamentos de Health and safety inspector. Los precios aqu no tienen en cuenta lo que podra costar con la ayuda del seguro (puede ser ms barato con su seguro), pero el sitio web puede darle el precio si no utiliz Tourist information centre manager.  - Puede imprimir el cupn correspondiente y llevarlo con su receta a la farmacia.  - Tambin puede pasar por nuestra oficina durante el horario de atencin regular y Education officer, museum una tarjeta de cupones de GoodRx.  - Si necesita que su receta se enve electrnicamente a una farmacia diferente, informe a nuestra oficina a travs de MyChart de [place] o por telfono llamando al [phone] y presione la opcin 4.

## 2023-01-23 ENCOUNTER — Ambulatory Visit: Payer: BC Managed Care – PPO | Admitting: Physical Therapy

## 2023-01-24 ENCOUNTER — Ambulatory Visit: Payer: BC Managed Care – PPO | Admitting: Physical Therapy

## 2023-01-24 DIAGNOSIS — M25552 Pain in left hip: Secondary | ICD-10-CM | POA: Diagnosis not present

## 2023-01-24 DIAGNOSIS — J3081 Allergic rhinitis due to animal (cat) (dog) hair and dander: Secondary | ICD-10-CM | POA: Diagnosis not present

## 2023-01-24 DIAGNOSIS — M6281 Muscle weakness (generalized): Secondary | ICD-10-CM

## 2023-01-24 DIAGNOSIS — R2681 Unsteadiness on feet: Secondary | ICD-10-CM | POA: Diagnosis not present

## 2023-01-24 DIAGNOSIS — J301 Allergic rhinitis due to pollen: Secondary | ICD-10-CM | POA: Diagnosis not present

## 2023-01-24 DIAGNOSIS — R262 Difficulty in walking, not elsewhere classified: Secondary | ICD-10-CM

## 2023-01-24 DIAGNOSIS — R2689 Other abnormalities of gait and mobility: Secondary | ICD-10-CM

## 2023-01-24 DIAGNOSIS — R269 Unspecified abnormalities of gait and mobility: Secondary | ICD-10-CM

## 2023-01-24 DIAGNOSIS — J3089 Other allergic rhinitis: Secondary | ICD-10-CM | POA: Diagnosis not present
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
## 2023-01-25 ENCOUNTER — Other Ambulatory Visit: Payer: Self-pay | Admitting: Cardiology

## 2023-01-25 LAB — SURGICAL PATHOLOGY

## 2023-01-28 ENCOUNTER — Ambulatory Visit: Payer: BC Managed Care – PPO | Admitting: Physical Therapy

## 2023-01-28 DIAGNOSIS — R262 Difficulty in walking, not elsewhere classified: Secondary | ICD-10-CM

## 2023-01-28 DIAGNOSIS — R269 Unspecified abnormalities of gait and mobility: Secondary | ICD-10-CM

## 2023-01-28 DIAGNOSIS — R2689 Other abnormalities of gait and mobility: Secondary | ICD-10-CM | POA: Diagnosis not present

## 2023-01-28 DIAGNOSIS — R2681 Unsteadiness on feet: Secondary | ICD-10-CM

## 2023-01-28 DIAGNOSIS — M6281 Muscle weakness (generalized): Secondary | ICD-10-CM | POA: Diagnosis not present

## 2023-01-28 DIAGNOSIS — M25552 Pain in left hip: Secondary | ICD-10-CM | POA: Diagnosis not present
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
## 2023-01-31 DIAGNOSIS — J3089 Other allergic rhinitis: Secondary | ICD-10-CM | POA: Diagnosis not present

## 2023-01-31 DIAGNOSIS — J3081 Allergic rhinitis due to animal (cat) (dog) hair and dander: Secondary | ICD-10-CM | POA: Diagnosis not present

## 2023-01-31 DIAGNOSIS — J301 Allergic rhinitis due to pollen: Secondary | ICD-10-CM | POA: Diagnosis not present

## 2023-02-01 ENCOUNTER — Ambulatory Visit: Payer: BC Managed Care – PPO | Admitting: Physical Therapy

## 2023-02-04 ENCOUNTER — Ambulatory Visit: Payer: BC Managed Care – PPO | Admitting: Family Medicine

## 2023-02-04 ENCOUNTER — Ambulatory Visit: Payer: BC Managed Care – PPO | Admitting: Physical Therapy

## 2023-02-04 ENCOUNTER — Telehealth: Payer: Self-pay

## 2023-02-04 DIAGNOSIS — E782 Mixed hyperlipidemia: Secondary | ICD-10-CM | POA: Diagnosis not present

## 2023-02-04 DIAGNOSIS — C4492 Squamous cell carcinoma of skin, unspecified: Secondary | ICD-10-CM

## 2023-02-04 DIAGNOSIS — I2510 Atherosclerotic heart disease of native coronary artery without angina pectoris: Secondary | ICD-10-CM | POA: Diagnosis not present

## 2023-02-04 NOTE — Telephone Encounter (Addendum)
Advised pt of bx results and discussed treatment options.  Pt would prefer mohs in [place] for both areas.  Referral placed and emailed.  Scheduled patient for ~57m f/u for TBSE./sh

## 2023-02-04 NOTE — Telephone Encounter (Signed)
-----   Message from [facility] sent at 01/26/2023  7:18 AM EST ----- Diagnosis 1. Skin, right posterior parietal :       SQUAMOUS CELL CARCINOMA IN SITU        2. Skin, vertex scalp :       WELL DIFFERENTIATED SQUAMOUS CELL CARCINOMA WITH SUPERFICIAL INFILTRATION   1. FOR right posterior parietal scalp   Please call with diagnosis and message me with patient's decision on treatment.   Explanation: This is a squamous cell skin cancer limited to the top layer of skin. This means it is an early cancer and has not spread. However, it has the potential to spread beyond the skin and threaten your health, so we recommend treating it.   Treatment option 1: a cream (fluorouracil and calcipotriene) that helps your immune system clear the skin cancer. It will cause redness and irritation. Wait two weeks after the biopsy to start applying the cream. Apply the cream twice per day until the redness and irritation develop (usually occurs by day 7), then stop and allow it to heal. We will recheck the area in 2 months to ensure the cancer is gone. The cream is $35 plus shipping and will be mailed to you from a low cost compounding pharmacy.  Treatment option 2: you return for a brief appointment where I perform electrodesiccation and curettage [facility]). This involves three rounds of scraping and burning to destroy the skin cancer. It has about an 85% cure rate and leaves a round wound slightly larger than the skin cancer and leaves a round white scar. No additional pathology is done. If the skin cancer comes back, we would need to do a surgery to remove it.  Treatment option 3: Mohs surgery, which involves cutting out right around the skin cancer and then checking under the microscope on the same day to ensure the whole skin cancer is out. If there is more cancer remaining, the surgeon will repeat the process until it is fully cleared. The cure rate is about 98-99%. It is done at another office outside of  [place] ([place], [place], or [place]). Once the Mohs surgeon confirms the skin cancer is out, they will discuss the options to repair or heal the area. You must take it easy for about two weeks after surgery (no lifting over 10-15 lbs, avoid activity to get your heart rate and blood pressure up).   2. FOR vertex scalp: This is a squamous cell skin cancer that has spread to the second layer of skin. It has the potential to spread beyond the skin and threaten your health, so we recommend treating it.  Mohs surgery, which involves cutting out right around the skin cancer and then checking under the microscope on the same day to ensure the whole skin cancer is out. If there is more cancer remaining, the surgeon will repeat the process until it is fully cleared. The cure rate is about 98-99%. It is done at another office outside of [place] ([place], [place], or [place]). Once the Mohs surgeon confirms the skin cancer is out, they will discuss the options to repair or heal the area. You must take it easy for about two weeks after surgery (no lifting over 10-15 lbs, avoid activity to get your heart rate and blood pressure up).

## 2023-02-05 LAB — LIPID PANEL
Chol/HDL Ratio: 2.4 {ratio} (ref 0.0–4.4)
Cholesterol, Total: 139 mg/dL (ref 100–199)
HDL: 59 mg/dL (ref >39)
LDL Chol Calc (NIH): 58 mg/dL (ref 0–99)
Triglycerides: 127 mg/dL (ref 0–149)
VLDL Cholesterol Cal: 22 mg/dL (ref 5–40)

## 2023-02-05 NOTE — Progress Notes (Signed)
Excellent change in her lipid profile with her change in diet and weight loss. No change in therapy needed.

## 2023-02-07 ENCOUNTER — Ambulatory Visit: Payer: BC Managed Care – PPO | Admitting: Physical Therapy

## 2023-02-07 DIAGNOSIS — J3081 Allergic rhinitis due to animal (cat) (dog) hair and dander: Secondary | ICD-10-CM | POA: Diagnosis not present

## 2023-02-07 DIAGNOSIS — J3089 Other allergic rhinitis: Secondary | ICD-10-CM | POA: Diagnosis not present

## 2023-02-07 DIAGNOSIS — J301 Allergic rhinitis due to pollen: Secondary | ICD-10-CM | POA: Diagnosis not present

## 2023-02-11 ENCOUNTER — Ambulatory Visit: Payer: BC Managed Care – PPO | Admitting: Physical Therapy

## 2023-02-12 DIAGNOSIS — J3081 Allergic rhinitis due to animal (cat) (dog) hair and dander: Secondary | ICD-10-CM | POA: Diagnosis not present

## 2023-02-12 DIAGNOSIS — J3089 Other allergic rhinitis: Secondary | ICD-10-CM | POA: Diagnosis not present

## 2023-02-12 DIAGNOSIS — J301 Allergic rhinitis due to pollen: Secondary | ICD-10-CM | POA: Diagnosis not present

## 2023-02-12 DIAGNOSIS — H1045 Other chronic allergic conjunctivitis: Secondary | ICD-10-CM | POA: Diagnosis not present

## 2023-02-13 ENCOUNTER — Ambulatory Visit: Payer: BC Managed Care – PPO | Admitting: Physical Therapy

## 2023-02-14 ENCOUNTER — Ambulatory Visit: Payer: BC Managed Care – PPO | Admitting: Physical Therapy

## 2023-02-18 ENCOUNTER — Ambulatory Visit: Payer: BC Managed Care – PPO | Admitting: Physical Therapy

## 2023-02-21 ENCOUNTER — Ambulatory Visit: Payer: BC Managed Care – PPO | Admitting: Physical Therapy

## 2023-02-21 ENCOUNTER — Encounter: Payer: Self-pay | Admitting: Family Medicine

## 2023-02-21 ENCOUNTER — Ambulatory Visit: Payer: BC Managed Care – PPO | Admitting: Family Medicine

## 2023-02-21 VITALS — BP 124/72 | Ht 66.0 in | Wt 174.4 lb

## 2023-02-21 DIAGNOSIS — I10 Essential (primary) hypertension: Secondary | ICD-10-CM | POA: Diagnosis not present

## 2023-02-21 DIAGNOSIS — J301 Allergic rhinitis due to pollen: Secondary | ICD-10-CM | POA: Diagnosis not present

## 2023-02-21 DIAGNOSIS — E1169 Type 2 diabetes mellitus with other specified complication: Secondary | ICD-10-CM | POA: Diagnosis not present

## 2023-02-21 DIAGNOSIS — E119 Type 2 diabetes mellitus without complications: Secondary | ICD-10-CM

## 2023-02-21 DIAGNOSIS — J3081 Allergic rhinitis due to animal (cat) (dog) hair and dander: Secondary | ICD-10-CM | POA: Diagnosis not present

## 2023-02-21 DIAGNOSIS — G4733 Obstructive sleep apnea (adult) (pediatric): Secondary | ICD-10-CM | POA: Diagnosis not present

## 2023-02-21 DIAGNOSIS — E039 Hypothyroidism, unspecified: Secondary | ICD-10-CM

## 2023-02-21 DIAGNOSIS — J3089 Other allergic rhinitis: Secondary | ICD-10-CM | POA: Diagnosis not present

## 2023-02-21 DIAGNOSIS — G629 Polyneuropathy, unspecified: Secondary | ICD-10-CM | POA: Diagnosis not present

## 2023-02-21 DIAGNOSIS — E782 Mixed hyperlipidemia: Secondary | ICD-10-CM

## 2023-02-21 MED ORDER — DULOXETINE HCL 60 MG PO CPEP: 60.0000 mg | ORAL_CAPSULE | Freq: Every day | ORAL | 2 refills | Status: AC

## 2023-02-21 NOTE — Progress Notes (Signed)
Established patient visit   Patient: Cindy Hester   DOB: 27-Jul-1945   78 y.o. Female  MRN: [id number] Visit Date: 02/21/2023  Today's healthcare provider: Ronnald Ramp, MD   Chief Complaint  Patient presents with   Follow-up   Subjective     HPI   Dermatology have surgery tomorrow to remove cancer skin Last edited by Shelly Bombard, CMA on 02/21/2023  9:40 AM.       Discussed the use of AI scribe software for clinical note transcription with the patient, who gave verbal consent to proceed.  History of Present Illness   The patient, a 78 year old with a history of neuropathy, presented for a follow-up visit. She reported recent surgery for skin cancer removal performed by her dermatologist. The patient also requests completion of a form for her job to confirm her lack of nicotine use, which was confirmed to be negative via blood testing last year.  The patient reported occasional neuropathy symptoms, describing a burning sensation from the knee down. The frequency of these symptoms has decreased, and she is managed with a cooling gel. The patient also reported changes in her medication regimen by her cardiologist, including an increase in rosuvastatin dosage and a decrease in amlodipine and metoprolol.   She also mentioned a recent diagnosis of squamous cell skin cancer, with an appointment scheduled for Mohs surgery. The patient expressed anxiety about the depth of the surgery due to the thinness of her scalp.  The patient also reported a recent development of trigger thumb, with an appointment scheduled with her hand doctor. She mentioned a history of physical therapy for her knees, which was discontinued to preserve her annual allotment of sessions in case knee surgery becomes necessary.  The patient reported managing her diabetes with a weight management medication and has noticed weight loss. However, she expressed concern about persistent areas of fat in her  abdomen and was considering options for fat reduction.  In summary, the patient presented with a complex medical history, including neuropathy, skin cancer, abnormal Pap test, trigger thumb, and diabetes. She is actively managing these conditions with various specialists and treatments. The patient expressed anxiety about upcoming procedures and potential changes in her health status.         Past Medical History:  Diagnosis Date   1st degree AV block    Arthritis    right shoulder blade, left thumb   Carotid arterial disease (HCC)    a. 10/2020 U/S: RICA 16-49%, LICA 16-49%. Antegrade bilat vertebral flow.   Chronic venous insufficiency    CKD (chronic kidney disease), stage III (HCC)    Diabetes mellitus without complication (HCC)    GERD (gastroesophageal reflux disease)    Heart murmur    a. 12/2017 Echo: EF 50-55%, mild conc LVH, GrI DD, Trace MR/TR.   History of esophagitis    History of gastritis    History of stress test    a. 12/2017 MV: HTN response to exercise. EF 68%. Small, mild, reversible inferior apical defect-->Low risk.   HLD (hyperlipidemia)    Hypertension    Lymphedema    legs   SCC (squamous cell carcinoma) 01/22/2023   Vertex scalp, referral for mohs   Sleep apnea    a. 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 Will alternate with 650 mg, 2 tabs daily when pain increased   amLODipine (NORVASC) 10 MG tablet Take 0.5 tablets (5 mg total) by mouth daily.   aspirin 81 MG chewable tablet Chew 81 mg by mouth daily.   azelastine (ASTELIN) 0.1 % nasal spray SMARTSIG:1-2 Spray(s) Both Nares Twice Daily   BIOTIN 5000 PO Take by mouth daily.   celecoxib (CELEBREX)  200 MG capsule TAKE 1 CAPSULE(200 MG) BY MOUTH TWICE DAILY   cetirizine (ZYRTEC) 10 MG tablet Take 10 mg by mouth daily.   cholecalciferol (VITAMIN D) 1000 UNITS tablet Take 5,000 Units by mouth.  Twice a week Mon, Fri   docusate sodium (COLACE) 100 MG capsule Take 100 mg by mouth 2 (two) times daily.   empagliflozin (JARDIANCE) 25 MG TABS tablet Take 25 mg by mouth daily.   EPINEPHrine 0.3 mg/0.3 mL IJ SOAJ injection    ezetimibe (ZETIA) 10 MG tablet TAKE 1 TABLET(10 MG) BY MOUTH DAILY AFTER SUPPER   FIBER SELECT GUMMIES PO Take by mouth.   furosemide (LASIX) 40 MG tablet TAKE 1 TABLET BY MOUTH AS NEEDED FOR SWELLING   GLUCOSAMINE-CHONDROITIN PO Take 2,000 mg by mouth daily.   lactulose (CHRONULAC) 10 GM/15ML solution    metoprolol (TOPROL-XL) 100 MG 24 hr tablet Take 1 tablet (100 mg total) by mouth daily.   montelukast (SINGULAIR) 10 MG tablet TAKE 1 TABLET BY MOUTH DAILY   Multiple Vitamin (MULTIVITAMIN) tablet Take 1 tablet by mouth daily.   NON FORMULARY CPAP   pantoprazole (PROTONIX) 40 MG tablet Take 1 tablet (40 mg total) by mouth daily.   rosuvastatin (CRESTOR) 40 MG tablet Take 1 tablet (40 mg total) by mouth daily.   spironolactone (ALDACTONE) 25 MG tablet TAKE 1 TABLET BY MOUTH EVERY DAY   tirzepatide (MOUNJARO) 2.5 MG/0.5ML Pen Inject 2.5 mg into the skin once a week.   valACYclovir (VALTREX) 500 MG tablet TAKE 1 TABLET BY MOUTH TWICE DAILY AS NEEDED   valsartan-hydrochlorothiazide (DIOVAN-HCT) 160-25 MG tablet TAKE 1 TABLET BY MOUTH EVERY MORNING   vitamin B-12 (CYANOCOBALAMIN) 1000 MCG tablet Take 2,500 mcg by mouth 2 (two) times a week. Mon, Fri   [DISCONTINUED] DULoxetine (CYMBALTA) 30 MG capsule Take 1 capsule (30 mg total) by mouth daily.   No facility-administered medications prior to visit.    Review of Systems  Last metabolic panel Lab Results  Component Value Date   GLUCOSE 200 (H) 11/07/2022   NA 144 11/07/2022   K 4.7 11/07/2022   CL 103 11/07/2022   CO2  22 11/07/2022   BUN 13 11/07/2022   CREATININE 0.82 11/07/2022   EGFR 75 11/07/2022   CALCIUM 10.0 11/07/2022   PROT 6.5 11/07/2022   ALBUMIN 4.4 11/07/2022   LABGLOB 2.1 11/07/2022   AGRATIO 2.3 (H) 10/27/2021   BILITOT 0.5 11/07/2022   ALKPHOS 81 11/07/2022   AST 16 11/07/2022   ALT 14 11/07/2022   ANIONGAP 9 09/29/2021   Last hemoglobin A1c Lab Results  Component Value Date   HGBA1C 6.7 (A) 04/27/2022   Last thyroid functions Lab Results  Component Value Date   TSH 1.440 11/07/2022        Objective    BP 124/72 (Cuff Size: Normal)   Ht 5\' 6"  (1.676 m)   Wt 174 lb 6.4 oz (79.1 kg)   SpO2 100%  BMI 28.15 kg/m   BP Readings from Last 3 Encounters:  02/21/23 124/72  12/24/22 118/62  12/17/22 126/62   Wt Readings from Last 3 Encounters:  02/21/23 174 lb 6.4 oz (79.1 kg)  12/24/22 178 lb 3.2 oz (80.8 kg)  12/17/22 180 lb 3.2 oz (81.7 kg)       Physical Exam Vitals reviewed.  Constitutional:      General: She is not in acute distress.    Appearance: Normal appearance. She is not ill-appearing, toxic-appearing or diaphoretic.  Eyes:     Conjunctiva/sclera: Conjunctivae normal.  Cardiovascular:     Rate and Rhythm: Normal rate and regular rhythm.     Pulses: Normal pulses.     Heart sounds: Murmur heard.     No friction rub. No gallop.  Pulmonary:     Effort: Pulmonary effort is normal. No respiratory distress.     Breath sounds: Normal breath sounds. No stridor. No wheezing, rhonchi or rales.  Abdominal:     General: Bowel sounds are normal. There is no distension.     Palpations: Abdomen is soft.     Tenderness: There is no abdominal tenderness.  Musculoskeletal:     Comments: Trace LE edema   Skin:    Findings: No erythema or rash.  Neurological:     Mental Status: She is alert and oriented to person, place, and time.       No results found for any visits on 02/21/23.  Assessment & Plan     Problem List Items Addressed This Visit        Cardiovascular and Mediastinum   Primary hypertension   Elevated blood pressure noted at 166/54, likely exacerbated by anxiety related to upcoming Mohs surgery. Blood pressure rechecked and improved to 124/72 after discussion. - Monitor blood pressure - continue amlodipine 5mg  every day, metoprolol 100mg  every day, valsartan-hctz 160mg -25mg ,  spironolactone 25mg  qd        Endocrine   Type 2 diabetes mellitus without complication, without long-term current use of insulin (HCC)   Adult hypothyroidism     Nervous and Auditory   Neuropathy - Primary   Peripheral neuropathy with intermittent burning sensation from the knee down. Symptoms improved with duloxetine but still present occasionally. Vitamin B12, anemia, magnesium, and thyroid levels normal. Discussed increasing duloxetine to 60 mg to further manage symptoms. Gabapentin and amitriptyline not preferred due to potential side effects. - Increase duloxetine to 60 mg daily - Provide a printout of current medications      Relevant Medications   DULoxetine (CYMBALTA) 60 MG capsule     Other   HLD (hyperlipidemia)   Recent increase in rosuvastatin dosage from 20 mg to 40 mg by cardiologist. Cholesterol levels monitored. Chronic condition - Continue current rosuvastatin dosage - Follow-up with cardiologist on February 14          Squamous Cell Carcinoma Confirmed squamous cell carcinoma on the scalp. Patient scheduled for Mohs surgery tomorrow. Discussed procedure details and addressed anxiety about potential complications. 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 Previous trigger thumb in the left hand. - Appointment with Dr. Rosalyn Gess at [facility] on February 19 for evaluation and possible injection  Abnormal Pap Test Abnormal cells found in Pap test conducted on December 10.  Follow-up scheduled with gynecologist. - Follow-up appointment with gynecologist on April 16  General Health Maintenance Tobacco-free as confirmed by blood test. Regular follow-ups with multiple specialists. Discharged from physical therapy to conserve PT appointments for potential knee surgery. - Complete tobacco-free incentive form - Schedule next wellness visit in six months  Follow-up - Follow-up with cardiologist on February 14 - Follow-up with Dr. Rosalyn Gess on February 19 - Follow-up with gynecologist on April 16 - Follow-up with dermatologist Dr. Katrinka Blazing in May - Next wellness visit in six months.         Return in about 6 months (around 08/21/2023) for HTN.         Ronnald Ramp, MD  [facility] [phone] (phone) [phone] (fax)  [facility]

## 2023-02-21 NOTE — Assessment & Plan Note (Signed)
Elevated blood pressure noted at 166/54, likely exacerbated by anxiety related to upcoming Mohs surgery. Blood pressure rechecked and improved to 124/72 after discussion. - Monitor blood pressure - continue amlodipine 5mg  every day, metoprolol 100mg  every day, valsartan-hctz 160mg -25mg ,  spironolactone 25mg  qd

## 2023-02-21 NOTE — Assessment & Plan Note (Signed)
Recent increase in rosuvastatin dosage from 20 mg to 40 mg by cardiologist. Cholesterol levels monitored. Chronic condition - Continue current rosuvastatin dosage - Follow-up with cardiologist on February 14

## 2023-02-21 NOTE — Assessment & Plan Note (Signed)
Peripheral neuropathy with intermittent burning sensation from the knee down. Symptoms improved with duloxetine but still present occasionally. Vitamin B12, anemia, magnesium, and thyroid levels normal. Discussed increasing duloxetine to 60 mg to further manage symptoms. Gabapentin and amitriptyline not preferred due to potential side effects. - Increase duloxetine to 60 mg daily - Provide a printout of current medications

## 2023-02-22 ENCOUNTER — Ambulatory Visit: Payer: BC Managed Care – PPO | Admitting: Physical Therapy

## 2023-02-22 DIAGNOSIS — C4442 Squamous cell carcinoma of skin of scalp and neck: Secondary | ICD-10-CM | POA: Diagnosis not present

## 2023-02-22 DIAGNOSIS — L578 Other skin changes due to chronic exposure to nonionizing radiation: Secondary | ICD-10-CM | POA: Diagnosis not present

## 2023-02-22 DIAGNOSIS — L570 Actinic keratosis: Secondary | ICD-10-CM | POA: Diagnosis not present

## 2023-02-25 ENCOUNTER — Ambulatory Visit: Payer: BC Managed Care – PPO | Admitting: Physical Therapy

## 2023-02-26 DIAGNOSIS — E559 Vitamin D deficiency, unspecified: Secondary | ICD-10-CM | POA: Diagnosis not present

## 2023-02-26 DIAGNOSIS — J3089 Other allergic rhinitis: Secondary | ICD-10-CM | POA: Diagnosis not present

## 2023-02-26 DIAGNOSIS — E1169 Type 2 diabetes mellitus with other specified complication: Secondary | ICD-10-CM | POA: Diagnosis not present

## 2023-02-26 DIAGNOSIS — E1159 Type 2 diabetes mellitus with other circulatory complications: Secondary | ICD-10-CM | POA: Diagnosis not present

## 2023-02-26 DIAGNOSIS — E119 Type 2 diabetes mellitus without complications: Secondary | ICD-10-CM | POA: Diagnosis not present

## 2023-03-04 ENCOUNTER — Ambulatory Visit: Payer: BC Managed Care – PPO | Admitting: Physical Therapy

## 2023-03-04 ENCOUNTER — Ambulatory Visit: Payer: BC Managed Care – PPO | Admitting: Physician Assistant

## 2023-03-07 ENCOUNTER — Ambulatory Visit: Payer: BC Managed Care – PPO | Admitting: Physical Therapy

## 2023-03-11 ENCOUNTER — Ambulatory Visit: Payer: BC Managed Care – PPO | Admitting: Physical Therapy

## 2023-03-13 ENCOUNTER — Ambulatory Visit: Payer: BC Managed Care – PPO | Admitting: Physical Therapy

## 2023-03-13 ENCOUNTER — Telehealth: Payer: Self-pay

## 2023-03-13 NOTE — Telephone Encounter (Signed)
 Confirmed patient is scheduled for second MOHs site 03/29/2023 at 10:00am. aw

## 2023-03-13 NOTE — Telephone Encounter (Signed)
Specimen tracking and history updated from [facility] of [facility] scalp. I sent email to follow up that patient was scheduled for [facility] for two sites as the R posterior scalp still needs to be treated. aw

## 2023-03-14 ENCOUNTER — Ambulatory Visit: Payer: BC Managed Care – PPO | Admitting: Physical Therapy

## 2023-03-18 ENCOUNTER — Ambulatory Visit: Payer: BC Managed Care – PPO | Admitting: Physical Therapy

## 2023-03-20 ENCOUNTER — Ambulatory Visit: Payer: BC Managed Care – PPO | Admitting: Dermatology

## 2023-03-21 ENCOUNTER — Ambulatory Visit: Payer: BC Managed Care – PPO | Admitting: Physical Therapy

## 2023-03-21 NOTE — Progress Notes (Unsigned)
Cardiology Office Note:  .   Date:  03/22/2023  ID:  Cindy Hester, DOB 12-31-1945, MRN [id number] PCP: Cindy Ramp, MD  [facility] Providers Cardiologist:  Cindy Decamp, MD   History of Present Illness: .   Cindy Hester is a 78 y.o. Caucasian female patient with type 2 diabetes, hypertension, mild hyperlipidemia, chronic venous insufficiency, mild lymphedema, stage III CKD, obstructive sleep apnea on CPAP and compliant, extensive multivessel coronary artery calcification noted on the CT scan in February 2023 with a low risk and nonischemic Lexiscan nuclear stress test in March 2022 presents here for annual visit.     She also has chronic venous insufficiency in the deep venous system of the right lower extremity and chronic leg edema.  This is a 70-month office visit.  She is now on GLP-1 agonist, since then has been losing weight and feels the best she has in quite a while.  Leg edema significantly improved as well.  With weight loss her blood pressure is much more stable and amlodipine was reduced to half of 10 mg and also metoprolol succinate from 200 to 100 mg daily.  She overall she remains asymptomatic.  Discussed the use of AI scribe software for clinical note transcription with the patient, who gave verbal consent to proceed.  Labs   Lab Results  Component Value Date   CHOL 139 02/04/2023   HDL 59 02/04/2023   LDLCALC 58 02/04/2023   TRIG 127 02/04/2023   CHOLHDL 2.4 02/04/2023   Lab Results  Component Value Date   NA 144 11/07/2022   K 4.7 11/07/2022   CO2 22 11/07/2022   GLUCOSE 200 (H) 11/07/2022   BUN 13 11/07/2022   CREATININE 0.82 11/07/2022   CALCIUM 10.0 11/07/2022   EGFR 75 11/07/2022   GFRNONAA >60 09/29/2021      Latest Ref Rng & Units 11/07/2022    9:48 AM 10/27/2021    9:37 AM 09/29/2021    4:38 PM  BMP  Glucose 70 - 99 mg/dL 782  956  213   BUN 8 - 27 mg/dL 13  25  28    Creatinine 0.57 - 1.00 mg/dL 0.86  5.78  4.69   BUN/Creat  Ratio 12 - 28 16  25     Sodium 134 - 144 mmol/L 144  135  132   Potassium 3.5 - 5.2 mmol/L 4.7  4.4  4.4   Chloride 96 - 106 mmol/L 103  98  100   CO2 20 - 29 mmol/L 22  21  23    Calcium 8.7 - 10.3 mg/dL 62.9  9.5  8.2       Latest Ref Rng & Units 11/07/2022    9:48 AM 04/27/2022    9:20 AM 09/29/2021    4:38 PM  CBC  WBC 3.4 - 10.8 x10E3/uL 6.9  6.1    Hemoglobin 11.1 - 15.9 g/dL 52.8  41.3  24.4   Hematocrit 34.0 - 46.6 % 44.9  43.7  31.3   Platelets 150 - 450 x10E3/uL 267  301     Lab Results  Component Value Date   HGBA1C 6.7 (A) 04/27/2022    Lab Results  Component Value Date   TSH 1.440 11/07/2022    Lab Results  Component Value Date   HGBA1C 6.7 (A) 04/27/2022    Review of Systems  Cardiovascular:  Negative for chest pain, dyspnea on exertion and leg swelling.   Physical Exam:   VS:  BP 126/76 (BP  Location: Right Arm, Patient Position: Sitting, Cuff Size: Large)   Pulse 73   Resp 16   Ht 5\' 6"  (1.676 m)   Wt 176 lb 3.2 oz (79.9 kg)   SpO2 98%   BMI 28.44 kg/m    Wt Readings from Last 3 Encounters:  03/22/23 176 lb 3.2 oz (79.9 kg)  02/21/23 174 lb 6.4 oz (79.1 kg)  12/24/22 178 lb 3.2 oz (80.8 kg)     Physical Exam Neck:     Vascular: Carotid bruit (bilateral) present. No JVD.  Cardiovascular:     Rate and Rhythm: Normal rate and regular rhythm.     Pulses: Intact distal pulses.     Heart sounds: S1 normal and S2 normal. Murmur heard.     Early systolic murmur is present with a grade of 2/6 at the upper right sternal border.     No gallop.  Pulmonary:     Effort: Pulmonary effort is normal.     Breath sounds: Normal breath sounds.  Abdominal:     General: Bowel sounds are normal.     Palpations: Abdomen is soft.  Musculoskeletal:     Right lower leg: No edema.     Left lower leg: No edema.    Studies Reviewed: Marland Kitchen    ABI 01/07/2023: Right: Resting right ankle-brachial index is within normal range.  1.15.  The right toe-brachial index is normal.   Normal triphasic waveform. Left: Resting left ankle-brachial index is within normal range.  1.17.  The left toe-brachial index is normal.  Normal triphasic waveform. EKG:   EKG 12/24/2022: Sinus rhythm with first-degree AV block at rate of 58 bpm otherwise normal EKG. Compared to 03/11/2021, no significant change.   Medications and allergies    Allergies  Allergen Reactions   Tape     Most tapes cause redness.  Paper tape and tegaderm are OK.     Current Outpatient Medications:    acetaminophen (TYLENOL) 500 MG tablet, Take 1,000 mg by mouth daily as needed. Will alternate with 650 mg, 2 tabs daily when pain increased, Disp: , Rfl:    amLODipine (NORVASC) 5 MG tablet, Take 1 tablet (5 mg total) by mouth daily., Disp: 90 tablet, Rfl: 0   aspirin 81 MG chewable tablet, Chew 81 mg by mouth daily., Disp: , Rfl:    azelastine (ASTELIN) 0.1 % nasal spray, SMARTSIG:1-2 Spray(s) Both Nares Twice Daily, Disp: , Rfl:    BIOTIN 5000 PO, Take by mouth daily., Disp: , Rfl:    celecoxib (CELEBREX) 200 MG capsule, TAKE 1 CAPSULE(200 MG) BY MOUTH TWICE DAILY, Disp: 30 capsule, Rfl: 2   cetirizine (ZYRTEC) 10 MG tablet, Take 10 mg by mouth daily., Disp: , Rfl:    cholecalciferol (VITAMIN D) 1000 UNITS tablet, Take 5,000 Units by mouth.  Twice a week Mon, Fri, Disp: , Rfl:    docusate sodium (COLACE) 100 MG capsule, Take 100 mg by mouth 2 (two) times daily., Disp: , Rfl:    DULoxetine (CYMBALTA) 60 MG capsule, Take 1 capsule (60 mg total) by mouth daily., Disp: 60 capsule, Rfl: 2   empagliflozin (JARDIANCE) 25 MG TABS tablet, Take 25 mg by mouth daily., Disp: , Rfl:    EPINEPHrine 0.3 mg/0.3 mL IJ SOAJ injection, , Disp: , Rfl: 1   ezetimibe (ZETIA) 10 MG tablet, TAKE 1 TABLET(10 MG) BY MOUTH DAILY AFTER SUPPER, Disp: 90 tablet, Rfl: 3   FIBER SELECT GUMMIES PO, Take by mouth., Disp: , Rfl:  furosemide (LASIX) 40 MG tablet, TAKE 1 TABLET BY MOUTH AS NEEDED FOR SWELLING, Disp: 20 tablet, Rfl: 2    GLUCOSAMINE-CHONDROITIN PO, Take 2,000 mg by mouth daily., Disp: , Rfl:    lactulose (CHRONULAC) 10 GM/15ML solution, , Disp: , Rfl:    montelukast (SINGULAIR) 10 MG tablet, TAKE 1 TABLET BY MOUTH DAILY, Disp: 90 tablet, Rfl: 2   Multiple Vitamin (MULTIVITAMIN) tablet, Take 1 tablet by mouth daily., Disp: , Rfl:    NON FORMULARY, CPAP, Disp: , Rfl:    pantoprazole (PROTONIX) 40 MG tablet, Take 1 tablet (40 mg total) by mouth daily., Disp: 90 tablet, Rfl: 3   rosuvastatin (CRESTOR) 40 MG tablet, Take 1 tablet (40 mg total) by mouth daily., Disp: 90 tablet, Rfl: 0   spironolactone (ALDACTONE) 25 MG tablet, TAKE 1 TABLET BY MOUTH EVERY DAY, Disp: 90 tablet, Rfl: 1   tirzepatide (MOUNJARO) 2.5 MG/0.5ML Pen, Inject 2.5 mg into the skin once a week., Disp: , Rfl:    valACYclovir (VALTREX) 500 MG tablet, TAKE 1 TABLET BY MOUTH TWICE DAILY AS NEEDED, Disp: 60 tablet, Rfl: 3   valsartan-hydrochlorothiazide (DIOVAN-HCT) 160-25 MG tablet, TAKE 1 TABLET BY MOUTH EVERY MORNING, Disp: 90 tablet, Rfl: 3   vitamin B-12 (CYANOCOBALAMIN) 1000 MCG tablet, Take 2,500 mcg by mouth 2 (two) times a week. Mon, Fri, Disp: , Rfl:    metoprolol succinate (TOPROL-XL) 100 MG 24 hr tablet, Take 1 tablet (100 mg total) by mouth daily., Disp: 90 tablet, Rfl: 0   ASSESSMENT AND PLAN: .      ICD-10-CM   1. Coronary artery calcification seen on CAT scan  I25.10     2. Bradycardia by electrocardiogram  R00.1     3. Primary hypertension  I10 metoprolol succinate (TOPROL-XL) 100 MG 24 hr tablet    4. Mixed hyperlipidemia  E78.2     5. 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   Meds ordered this encounter  Medications   metoprolol succinate (TOPROL-XL) 100 MG 24 hr tablet    Sig: Take 1 tablet (100 mg total) by mouth daily.    Dispense:  90 tablet    Refill:  0   amLODipine (NORVASC) 5 MG tablet    Sig: Take 1 tablet (5 mg total) by mouth daily.    Dispense:  90  tablet    Refill:  0    1. Coronary artery calcification seen on CAT scan Patient is presently asymptomatic and on appropriate medical therapy and she has no angina pectoris.  LDL is at goal.  2. Bradycardia by electrocardiogram Although she has mild bradycardia on the EKG, I did not repeat EKG, although 78 years of age she is tolerating 100 mg of metoprolol succinate without any complications.  Blood pressure is also well-controlled.  Today her heart rate was 73 bpm.  3. Primary hypertension Blood pressure well-controlled on a combination of amlodipine 5 mg daily, metoprolol succinate 100 mg daily and spironolactone 25 mg daily.  She is also on valsartan HCT 160/25 mg daily.  Renal function is normal.  4. Mixed hyperlipidemia Lipids are also well-controlled, continue Crestor 20 mg daily.  5. Pseudoclaudication She had complained of bilateral lower extremity pain, ABI reviewed, she has normal ABI waveforms, symptoms probably related to pseudoclaudication.  No further evaluation from vascular standpoint is indicated at this time.  She also has bilateral carotid bruit, she has had carotid artery duplex in  2022 revealing no significant carotid stenosis.  She is on appropriately treated with aspirin as well.  I will see her back on a as needed basis.  Further refills with her PCP.     Signed,  Cindy Decamp, MD, [facility] 03/22/2023, 9:04 AM [facility] [address] [place], [place] [address] Phone: [phone]. Fax:  [phone]

## 2023-03-22 ENCOUNTER — Ambulatory Visit: Payer: BC Managed Care – PPO | Admitting: Physical Therapy

## 2023-03-22 ENCOUNTER — Encounter: Payer: Self-pay | Admitting: Cardiology

## 2023-03-22 ENCOUNTER — Ambulatory Visit: Payer: BC Managed Care – PPO | Attending: Cardiology | Admitting: Cardiology

## 2023-03-22 VITALS — BP 126/76 | HR 73 | Resp 16 | Ht 66.0 in | Wt 176.2 lb

## 2023-03-22 DIAGNOSIS — I10 Essential (primary) hypertension: Secondary | ICD-10-CM | POA: Diagnosis not present

## 2023-03-22 DIAGNOSIS — E782 Mixed hyperlipidemia: Secondary | ICD-10-CM

## 2023-03-22 DIAGNOSIS — J3081 Allergic rhinitis due to animal (cat) (dog) hair and dander: Secondary | ICD-10-CM | POA: Diagnosis not present

## 2023-03-22 DIAGNOSIS — R001 Bradycardia, unspecified: Secondary | ICD-10-CM

## 2023-03-22 DIAGNOSIS — J3089 Other allergic rhinitis: Secondary | ICD-10-CM | POA: Diagnosis not present

## 2023-03-22 DIAGNOSIS — J301 Allergic rhinitis due to pollen: Secondary | ICD-10-CM | POA: Diagnosis not present

## 2023-03-22 DIAGNOSIS — I2510 Atherosclerotic heart disease of native coronary artery without angina pectoris: Secondary | ICD-10-CM

## 2023-03-22 DIAGNOSIS — M48062 Spinal stenosis, lumbar region with neurogenic claudication: Secondary | ICD-10-CM

## 2023-03-22 MED ORDER — METOPROLOL SUCCINATE ER 100 MG PO TB24: 100.0000 mg | ORAL_TABLET | Freq: Every day | ORAL | 0 refills | Status: DC

## 2023-03-22 MED ORDER — AMLODIPINE BESYLATE 5 MG PO TABS: 5.0000 mg | ORAL_TABLET | Freq: Every day | ORAL | 0 refills | Status: DC

## 2023-03-22 NOTE — Patient Instructions (Signed)
Medication Instructions:  Your physician recommends that you continue on your current medications as directed. Please refer to the Current Medication list given to you today.  *If you need a refill on your cardiac medications before your next appointment, please call your pharmacy*   Lab Work: none If you have labs (blood work) drawn today and your tests are completely normal, you will receive your results only by: MyChart Message (if you have MyChart) OR A paper copy in the mail If you have any lab test that is abnormal or we need to change your treatment, we will call you to review the results.   Testing/Procedures: none   Follow-Up: At [facility], you and your health needs are our priority.  As part of our continuing mission to provide you with exceptional heart care, we have created designated Provider Care Teams.  These Care Teams include your primary Cardiologist (physician) and Advanced Practice Providers (APPs -  Physician Assistants and Nurse Practitioners) who all work together to provide you with the care you need, when you need it.  We recommend signing up for the patient portal called "MyChart".  Sign up information is provided on this After Visit Summary.  MyChart is used to connect with patients for Virtual Visits (Telemedicine).  Patients are able to view lab/test results, encounter notes, upcoming appointments, etc.  Non-urgent messages can be sent to your provider as well.   To learn more about what you can do with MyChart, go to [email or web address].    Your next appointment:   As needed  Provider:   Yates Decamp, MD     Other Instructions

## 2023-03-25 ENCOUNTER — Ambulatory Visit: Payer: BC Managed Care – PPO | Admitting: Physical Therapy

## 2023-03-25 ENCOUNTER — Other Ambulatory Visit: Payer: Self-pay | Admitting: Cardiology

## 2023-03-26 NOTE — Telephone Encounter (Signed)
The last office note stated patient is taking Rosuvastatin 20 mg. Pharmacy is requesting Rosuvastatin 40 mg. Please advise. Thank you

## 2023-03-26 NOTE — Telephone Encounter (Signed)
I spoke with patient.  She will check her bottle at home and call us tomorrow with dose of Rosuvastatin she has been taking

## 2023-03-27 ENCOUNTER — Ambulatory Visit: Payer: BC Managed Care – PPO | Admitting: Physical Therapy

## 2023-03-27 ENCOUNTER — Ambulatory Visit: Payer: BC Managed Care – PPO | Admitting: Dermatology

## 2023-03-27 ENCOUNTER — Telehealth: Payer: Self-pay | Admitting: Cardiology

## 2023-03-27 DIAGNOSIS — M65312 Trigger thumb, left thumb: Secondary | ICD-10-CM | POA: Diagnosis not present

## 2023-03-27 DIAGNOSIS — M180 Bilateral primary osteoarthritis of first carpometacarpal joints: Secondary | ICD-10-CM | POA: Diagnosis not present

## 2023-03-27 NOTE — Telephone Encounter (Signed)
Pt states that she was told to call office to clarify which dosage of crestor she was on and it is the 40mg 

## 2023-03-28 ENCOUNTER — Ambulatory Visit: Payer: BC Managed Care – PPO | Admitting: Physical Therapy

## 2023-03-28 MED ORDER — ROSUVASTATIN CALCIUM 40 MG PO TABS: 40.0000 mg | ORAL_TABLET | Freq: Every day | ORAL | 2 refills | Status: AC

## 2023-03-28 MED ORDER — ROSUVASTATIN CALCIUM 40 MG PO TABS: 40.0000 mg | ORAL_TABLET | Freq: Every day | ORAL | 2 refills | Status: DC

## 2023-03-28 NOTE — Telephone Encounter (Signed)
Tried twice to send refill electronically but received message that refill did not go through.  Refill called to pharmacy

## 2023-03-28 NOTE — Addendum Note (Signed)
Addended by: Dossie Arbour on: 03/28/2023 04:27 PM   Modules accepted: Orders

## 2023-03-28 NOTE — Telephone Encounter (Signed)
From 2/19 phone note Pt states that she was told to call office to clarify which dosage of crestor she was on and it is the 40mg  . Refill sent to pharmacy

## 2023-03-28 NOTE — Addendum Note (Signed)
Addended by: Dossie Arbour on: 03/28/2023 04:11 PM   Modules accepted: Orders

## 2023-03-29 DIAGNOSIS — J3081 Allergic rhinitis due to animal (cat) (dog) hair and dander: Secondary | ICD-10-CM | POA: Diagnosis not present

## 2023-03-29 DIAGNOSIS — J3089 Other allergic rhinitis: Secondary | ICD-10-CM | POA: Diagnosis not present

## 2023-03-29 DIAGNOSIS — J301 Allergic rhinitis due to pollen: Secondary | ICD-10-CM | POA: Diagnosis not present

## 2023-03-29 DIAGNOSIS — D044 Carcinoma in situ of skin of scalp and neck: Secondary | ICD-10-CM | POA: Diagnosis not present

## 2023-04-01 ENCOUNTER — Ambulatory Visit: Payer: BC Managed Care – PPO | Admitting: Physical Therapy

## 2023-04-02 ENCOUNTER — Ambulatory Visit: Payer: BC Managed Care – PPO | Admitting: Dermatology

## 2023-04-02 ENCOUNTER — Encounter: Payer: Self-pay | Admitting: Dermatology

## 2023-04-02 DIAGNOSIS — Z5111 Encounter for antineoplastic chemotherapy: Secondary | ICD-10-CM | POA: Diagnosis not present

## 2023-04-02 DIAGNOSIS — L578 Other skin changes due to chronic exposure to nonionizing radiation: Secondary | ICD-10-CM | POA: Diagnosis not present

## 2023-04-02 DIAGNOSIS — Z85828 Personal history of other malignant neoplasm of skin: Secondary | ICD-10-CM

## 2023-04-02 DIAGNOSIS — L988 Other specified disorders of the skin and subcutaneous tissue: Secondary | ICD-10-CM

## 2023-04-02 DIAGNOSIS — Z79899 Other long term (current) drug therapy: Secondary | ICD-10-CM

## 2023-04-02 DIAGNOSIS — W908XXA Exposure to other nonionizing radiation, initial encounter: Secondary | ICD-10-CM

## 2023-04-02 DIAGNOSIS — Z86007 Personal history of in-situ neoplasm of skin: Secondary | ICD-10-CM

## 2023-04-02 DIAGNOSIS — L570 Actinic keratosis: Secondary | ICD-10-CM | POA: Diagnosis not present

## 2023-04-02 DIAGNOSIS — Z7189 Other specified counseling: Secondary | ICD-10-CM

## 2023-04-02 NOTE — Progress Notes (Addendum)
 Follow-Up Visit   Subjective  Cindy Hester is a 78 y.o. 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 Jeannine Boga, pt was given 5FU cream to use on scalp for Aks (after surgery sites heal) Dr. Jeannine Boga noticed during mohs procedure  The following portions of the chart were reviewed this encounter and updated as appropriate: medications, allergies, medical history  Review of Systems:  No other skin or systemic complaints except as noted in HPI or Assessment and Plan.  Objective  Well appearing patient in no apparent distress; mood and affect are within normal limits.  A focused examination was performed of the face.  Relevant physical exam findings are noted in the Assessment and Plan.  Injection map photo   Scalp x 1 Pink scaly macules  Assessment & Plan   AK (ACTINIC KERATOSIS) Scalp x 1 Actinic keratoses are precancerous spots that appear secondary to cumulative UV radiation exposure/sun exposure over time. They are chronic with expected duration over 1 year. A portion of actinic keratoses will progress to squamous cell carcinoma of the skin. It is not possible to reliably predict which spots will progress to skin cancer and so treatment is recommended to prevent development of skin cancer.  Recommend daily broad spectrum sunscreen SPF 30+ to sun-exposed areas, reapply every 2 hours as needed.  Recommend staying in the shade or wearing long sleeves, sun glasses (UVA+UVB protection) and wide brim hats (4-inch brim around the entire circumference of the hat). Call for new or changing lesions. Destruction of lesion - Scalp x 1 Complexity: simple   Destruction method: cryotherapy   Informed consent: discussed and consent obtained   Timeout:  patient name, date of birth, surgical site, and procedure verified Lesion destroyed using liquid nitrogen: Yes   Region frozen until ice ball extended beyond lesion: Yes    Outcome: patient tolerated procedure well with no complications   Post-procedure details: wound care instructions given   ELASTOSIS OF SKIN   ACTINIC SKIN DAMAGE   CHEMOTHERAPY MANAGEMENT, ENCOUNTER FOR   COUNSELING AND COORDINATION OF CARE   MEDICATION MANAGEMENT    Facial Elastosis Botox 48 units injected today to: - Frown complex 32.5 units - Chin 7.5 units - DAO's 4 units x 2  Location: frown complex, chin, DAOs  Informed consent: Discussed risks (infection, pain, bleeding, bruising, swelling, allergic reaction, paralysis of nearby muscles, eyelid droop, double vision, neck weakness, difficulty breathing, headache, undesirable cosmetic result, and need for additional treatment) and benefits of the procedure, as well as the alternatives.  Informed consent was obtained.  Preparation: The area was cleansed with alcohol.  Procedure Details:  Botox was injected into the dermis with a 30-gauge needle. Pressure applied to any bleeding. Ice packs offered for swelling.  Lot Number:  [id number] Expiration:  04/2025  Total Units Injected:  48  Plan: Tylenol may be used for headache.  Allow 2 weeks before returning to clinic for additional dosing as needed. Patient will call for any problems.  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  - Call if any new or  changing lesions are noted between office visits  - R post parietal mohs 03/29/23   ACTINIC DAMAGE WITH PRECANCEROUS ACTINIC KERATOSES Counseling for Topical Chemotherapy Management: Patient exhibits: - Severe, confluent actinic changes with pre-cancerous actinic keratoses that is secondary to cumulative UV radiation exposure over time - Condition that is severe; chronic, not at goal. - diffuse scaly erythematous macules and papules with underlying dyspigmentation - Discussed Prescription "Field Treatment" topical Chemotherapy for Severe, Chronic Confluent Actinic Changes with Pre-Cancerous Actinic Keratoses Field treatment involves treatment of an entire area of skin that has confluent Actinic Changes (Sun/ Ultraviolet light damage) and PreCancerous Actinic Keratoses by method of PhotoDynamic Therapy (PDT) and/or prescription Topical Chemotherapy agents such as 5-fluorouracil, 5-fluorouracil/calcipotriene, and/or imiquimod.  The purpose is to decrease the number of clinically evident and subclinical PreCancerous lesions to prevent progression to development of skin cancer by chemically destroying early precancer changes that may or may not be visible.  It has been shown to reduce the risk of developing skin cancer in the treated area. As a result of treatment, redness, scaling, crusting, and open sores may occur during treatment course. One or more than one of these methods may be used and may have to be used several times to control, suppress and eliminate the PreCancerous changes. Discussed treatment course, expected reaction, and possible side effects. - Recommend daily broad spectrum sunscreen SPF 30+ to sun-exposed areas, reapply every 2 hours as needed.  - Staying in the shade or wearing long sleeves, sun glasses (UVA+UVB protection) and wide brim hats (4-inch brim around the entire circumference of the hat) are also recommended. - Call for new or changing lesions.  - On f/u will plan to start  5FU cream (or can send in solution) as prescribed by Dr. Jeannine Boga for Aks on scalp or we can send in 5FU/Calcipotriene for shorter treatment course  Return for 3-57m Botox.; TBSE and discussion of 5-FU initiation  I, Sonya Hupman, RMA, am acting as scribe for Armida Sans, MD .  Documentation: I have reviewed the above documentation for accuracy and completeness, and I agree with the above.  Armida Sans, MD

## 2023-04-02 NOTE — Patient Instructions (Signed)

## 2023-04-03 ENCOUNTER — Ambulatory Visit: Payer: BC Managed Care – PPO | Admitting: Physical Therapy

## 2023-04-04 ENCOUNTER — Ambulatory Visit: Payer: BC Managed Care – PPO | Admitting: Physical Therapy

## 2023-04-05 DIAGNOSIS — J3089 Other allergic rhinitis: Secondary | ICD-10-CM | POA: Diagnosis not present

## 2023-04-05 DIAGNOSIS — J3081 Allergic rhinitis due to animal (cat) (dog) hair and dander: Secondary | ICD-10-CM | POA: Diagnosis not present

## 2023-04-05 DIAGNOSIS — J301 Allergic rhinitis due to pollen: Secondary | ICD-10-CM | POA: Diagnosis not present

## 2023-04-08 ENCOUNTER — Ambulatory Visit: Payer: BC Managed Care – PPO | Admitting: Physical Therapy

## 2023-04-10 ENCOUNTER — Ambulatory Visit: Payer: BC Managed Care – PPO | Admitting: Physical Therapy

## 2023-04-11 ENCOUNTER — Ambulatory Visit: Payer: BC Managed Care – PPO | Admitting: Physical Therapy

## 2023-04-12 DIAGNOSIS — J3081 Allergic rhinitis due to animal (cat) (dog) hair and dander: Secondary | ICD-10-CM | POA: Diagnosis not present

## 2023-04-12 DIAGNOSIS — J3089 Other allergic rhinitis: Secondary | ICD-10-CM | POA: Diagnosis not present

## 2023-04-12 DIAGNOSIS — J301 Allergic rhinitis due to pollen: Secondary | ICD-10-CM | POA: Diagnosis not present

## 2023-04-15 ENCOUNTER — Ambulatory Visit: Payer: BC Managed Care – PPO | Admitting: Physical Therapy

## 2023-04-17 ENCOUNTER — Ambulatory Visit: Payer: BC Managed Care – PPO | Admitting: Physical Therapy

## 2023-04-18 ENCOUNTER — Ambulatory Visit: Payer: BC Managed Care – PPO | Admitting: Physical Therapy

## 2023-04-19 DIAGNOSIS — J3089 Other allergic rhinitis: Secondary | ICD-10-CM | POA: Diagnosis not present

## 2023-04-19 DIAGNOSIS — J3081 Allergic rhinitis due to animal (cat) (dog) hair and dander: Secondary | ICD-10-CM | POA: Diagnosis not present

## 2023-04-19 DIAGNOSIS — J301 Allergic rhinitis due to pollen: Secondary | ICD-10-CM | POA: Diagnosis not present

## 2023-04-22 ENCOUNTER — Telehealth: Payer: Self-pay

## 2023-04-22 ENCOUNTER — Ambulatory Visit: Payer: BC Managed Care – PPO | Admitting: Physical Therapy

## 2023-04-22 NOTE — Telephone Encounter (Signed)
 Specimen tracking and history updated from [facility] progress notes of SCC - R central parietal scalp. aw

## 2023-04-24 ENCOUNTER — Ambulatory Visit: Payer: BC Managed Care – PPO | Admitting: Physical Therapy

## 2023-04-24 DIAGNOSIS — M65312 Trigger thumb, left thumb: Secondary | ICD-10-CM | POA: Diagnosis not present

## 2023-04-24 DIAGNOSIS — M1812 Unilateral primary osteoarthritis of first carpometacarpal joint, left hand: Secondary | ICD-10-CM | POA: Diagnosis not present

## 2023-04-25 ENCOUNTER — Ambulatory Visit: Payer: BC Managed Care – PPO | Admitting: Physical Therapy

## 2023-04-25 DIAGNOSIS — J3089 Other allergic rhinitis: Secondary | ICD-10-CM | POA: Diagnosis not present

## 2023-04-25 DIAGNOSIS — J3081 Allergic rhinitis due to animal (cat) (dog) hair and dander: Secondary | ICD-10-CM | POA: Diagnosis not present

## 2023-04-29 ENCOUNTER — Ambulatory Visit: Payer: BC Managed Care – PPO | Admitting: Physical Therapy

## 2023-05-03 DIAGNOSIS — J3089 Other allergic rhinitis: Secondary | ICD-10-CM | POA: Diagnosis not present

## 2023-05-03 DIAGNOSIS — J3081 Allergic rhinitis due to animal (cat) (dog) hair and dander: Secondary | ICD-10-CM | POA: Diagnosis not present

## 2023-05-03 DIAGNOSIS — J301 Allergic rhinitis due to pollen: Secondary | ICD-10-CM | POA: Diagnosis not present

## 2023-05-09 DIAGNOSIS — J3081 Allergic rhinitis due to animal (cat) (dog) hair and dander: Secondary | ICD-10-CM | POA: Diagnosis not present

## 2023-05-09 DIAGNOSIS — J3089 Other allergic rhinitis: Secondary | ICD-10-CM | POA: Diagnosis not present

## 2023-05-09 DIAGNOSIS — J301 Allergic rhinitis due to pollen: Secondary | ICD-10-CM | POA: Diagnosis not present

## 2023-05-17 DIAGNOSIS — J3081 Allergic rhinitis due to animal (cat) (dog) hair and dander: Secondary | ICD-10-CM | POA: Diagnosis not present

## 2023-05-17 DIAGNOSIS — J301 Allergic rhinitis due to pollen: Secondary | ICD-10-CM | POA: Diagnosis not present

## 2023-05-17 DIAGNOSIS — J3089 Other allergic rhinitis: Secondary | ICD-10-CM | POA: Diagnosis not present

## 2023-05-21 DIAGNOSIS — R85610 Atypical squamous cells of undetermined significance on cytologic smear of anus (ASC-US): Secondary | ICD-10-CM | POA: Diagnosis not present

## 2023-05-23 DIAGNOSIS — J3081 Allergic rhinitis due to animal (cat) (dog) hair and dander: Secondary | ICD-10-CM | POA: Diagnosis not present

## 2023-05-23 DIAGNOSIS — J301 Allergic rhinitis due to pollen: Secondary | ICD-10-CM | POA: Diagnosis not present

## 2023-05-23 DIAGNOSIS — J3089 Other allergic rhinitis: Secondary | ICD-10-CM | POA: Diagnosis not present

## 2023-05-31 DIAGNOSIS — J3089 Other allergic rhinitis: Secondary | ICD-10-CM | POA: Diagnosis not present

## 2023-05-31 DIAGNOSIS — J3081 Allergic rhinitis due to animal (cat) (dog) hair and dander: Secondary | ICD-10-CM | POA: Diagnosis not present

## 2023-05-31 DIAGNOSIS — J301 Allergic rhinitis due to pollen: Secondary | ICD-10-CM | POA: Diagnosis not present

## 2023-06-06 ENCOUNTER — Other Ambulatory Visit: Payer: Self-pay | Admitting: Cardiology

## 2023-06-06 DIAGNOSIS — I6523 Occlusion and stenosis of bilateral carotid arteries: Secondary | ICD-10-CM

## 2023-06-07 DIAGNOSIS — J3081 Allergic rhinitis due to animal (cat) (dog) hair and dander: Secondary | ICD-10-CM | POA: Diagnosis not present

## 2023-06-07 DIAGNOSIS — J3089 Other allergic rhinitis: Secondary | ICD-10-CM | POA: Diagnosis not present

## 2023-06-07 DIAGNOSIS — J301 Allergic rhinitis due to pollen: Secondary | ICD-10-CM | POA: Diagnosis not present

## 2023-06-12 ENCOUNTER — Other Ambulatory Visit: Payer: Self-pay | Admitting: Family Medicine

## 2023-06-12 DIAGNOSIS — K121 Other forms of stomatitis: Secondary | ICD-10-CM

## 2023-06-13 ENCOUNTER — Emergency Department
Admission: EM | Admit: 2023-06-13 | Discharge: 2023-06-13 | Disposition: A | Discharge status: Home / Self Care | Attending: Emergency Medicine | Admitting: Emergency Medicine

## 2023-06-13 ENCOUNTER — Other Ambulatory Visit: Payer: Self-pay

## 2023-06-13 ENCOUNTER — Emergency Department

## 2023-06-13 DIAGNOSIS — S0592XA Unspecified injury of left eye and orbit, initial encounter: Secondary | ICD-10-CM | POA: Diagnosis not present

## 2023-06-13 DIAGNOSIS — W19XXXA Unspecified fall, initial encounter: Secondary | ICD-10-CM | POA: Insufficient documentation

## 2023-06-13 DIAGNOSIS — S80212A Abrasion, left knee, initial encounter: Secondary | ICD-10-CM | POA: Diagnosis not present

## 2023-06-13 DIAGNOSIS — I6523 Occlusion and stenosis of bilateral carotid arteries: Secondary | ICD-10-CM | POA: Diagnosis not present

## 2023-06-13 DIAGNOSIS — R220 Localized swelling, mass and lump, head: Secondary | ICD-10-CM

## 2023-06-13 DIAGNOSIS — S0012XA Contusion of left eyelid and periocular area, initial encounter: Secondary | ICD-10-CM | POA: Diagnosis not present

## 2023-06-13 DIAGNOSIS — S0990XA Unspecified injury of head, initial encounter: Secondary | ICD-10-CM | POA: Diagnosis not present

## 2023-06-13 DIAGNOSIS — S0083XA Contusion of other part of head, initial encounter: Secondary | ICD-10-CM | POA: Insufficient documentation

## 2023-06-13 DIAGNOSIS — Z043 Encounter for examination and observation following other accident: Secondary | ICD-10-CM | POA: Diagnosis not present

## 2023-06-13 NOTE — ED Provider Notes (Signed)
 [facility] Provider Note    Event Date/Time   First MD Initiated Contact with Patient 06/13/23 1608     (approximate)   History   Headache   HPI  Cindy Hester is a 78 y.o. female who presents to the emergency department today from urgent care because of concerns for headache and head injury.  The patient fell last night around 10:00 at night.  It was a mechanical fall.  She denies any lightheadedness dizziness or palpitations prior to the fall.  She landed primarily on her left knee and hit the left side of her head.  Did suffer abrasions to her left knee and left face.  No loss of consciousness.  She was able to get up and drive home after the incident.  This morning however the patient continued to have headaches.  No nausea or vomiting. She has developed swelling to her left knee but has been able to walk on it.    Physical Exam   Triage Vital Signs: ED Triage Vitals  Encounter Vitals Group     BP 06/13/23 1537 (!) 184/75     Systolic BP Percentile --      Diastolic BP Percentile --      Pulse Rate 06/13/23 1537 79     Resp 06/13/23 1537 18     Temp 06/13/23 1537 97.7 F (36.5 C)     Temp Source 06/13/23 1537 Oral     SpO2 06/13/23 1537 99 %     Weight 06/13/23 1538 172 lb (78 kg)     Height 06/13/23 1538 5' 6.5" (1.689 m)     Head Circumference --      Peak Flow --      Pain Score 06/13/23 1537 4     Pain Loc --      Pain Education --      Exclude from Growth Chart --     Most recent vital signs: Vitals:   06/13/23 1537  BP: (!) 184/75  Pulse: 79  Resp: 18  Temp: 97.7 F (36.5 C)  SpO2: 99%   General: Awake, alert, oriented. CV:  Good peripheral perfusion. Regular rate and rhythm. Resp:  Normal effort. Lungs clear. Abd:  No distention.  Other:  No spinal tenderness. Abrasion and bruising to left side of forehead and periorbital area. Swelling, abrasions to left knee.    ED Results / Procedures / Treatments   Labs (all labs  ordered are listed, but only abnormal results are displayed) Labs Reviewed - No data to display   EKG  None   RADIOLOGY I independently interpreted and visualized the CT max face. My interpretation: No fracture Radiology interpretation:  IMPRESSION:  No acute facial bone fracture.   I independently interpreted and visualized the CT head. My interpretation: No ICH Radiology interpretation:  IMPRESSION:  1. No acute intracranial process.  2. Left frontal scalp soft tissue swelling.     Critical Care performed: No   MEDICATIONS ORDERED IN ED: Medications - No data to display   IMPRESSION / MDM / ASSESSMENT AND PLAN / ED COURSE  I reviewed the triage vital signs and the nursing notes.                              Differential diagnosis includes, but is not limited to, ICH, fracture, concussion, abrassion  Patient's presentation is most consistent with acute presentation with potential threat to life  or bodily function.   Patient presented to the emergency department today because of concerns for head injury.  On exam patient does have abrasion to the left side of her head.  CT scan of the head and max face without any fractures or intracranial bleed.  Additionally patient's left knee was swollen with some abrasions.  Patient is able to walk on it.  At this time I have a low concern for acute fracture.  Will plan on discharging.     FINAL CLINICAL IMPRESSION(S) / ED DIAGNOSES   Final diagnoses:  Injury of head, initial encounter     Rx / DC Orders     Note:  This document was prepared using Dragon voice recognition software and may include unintentional dictation errors.    Marylynn Soho, MD 06/13/23 [phone]

## 2023-06-13 NOTE — ED Notes (Signed)
 First nurse note-pt sent from [facility] for eval of head injury.  Pt fell last night, pt has a black eye and abrasions.  Pt alert, talking on cell phone phone in wheelchair.

## 2023-06-13 NOTE — ED Triage Notes (Signed)
 Pt reports she fell last night around 10pm, states she tripped when walking the pavement when attempting to get into her car. She reports she hit her head on the gravel/ground. No LOC.Denies taking blood thinners. Denies posterior neck pain. She also reports left knee pain, pain to forehead and back of head. She has bruising to left eyelid with abrasions to forehead. Denies vision changes. A&OX4.

## 2023-06-13 NOTE — ED Notes (Signed)
 See triage note  Presents s/p fall last night  Hitting head on gravel  Abrasion with swelling and bruising noted to left side of head/face  Also has some swelling to left knee

## 2023-06-18 ENCOUNTER — Other Ambulatory Visit: Payer: Self-pay | Admitting: Family Medicine

## 2023-06-18 DIAGNOSIS — G629 Polyneuropathy, unspecified: Secondary | ICD-10-CM

## 2023-06-19 ENCOUNTER — Ambulatory Visit: Payer: BC Managed Care – PPO | Admitting: Dermatology

## 2023-06-19 DIAGNOSIS — Z1283 Encounter for screening for malignant neoplasm of skin: Secondary | ICD-10-CM

## 2023-06-19 DIAGNOSIS — D229 Melanocytic nevi, unspecified: Secondary | ICD-10-CM

## 2023-06-19 DIAGNOSIS — L578 Other skin changes due to chronic exposure to nonionizing radiation: Secondary | ICD-10-CM | POA: Diagnosis not present

## 2023-06-19 DIAGNOSIS — L814 Other melanin hyperpigmentation: Secondary | ICD-10-CM | POA: Diagnosis not present

## 2023-06-19 DIAGNOSIS — W101XXS Fall (on)(from) sidewalk curb, sequela: Secondary | ICD-10-CM

## 2023-06-19 DIAGNOSIS — L821 Other seborrheic keratosis: Secondary | ICD-10-CM

## 2023-06-19 DIAGNOSIS — W908XXD Exposure to other nonionizing radiation, subsequent encounter: Secondary | ICD-10-CM

## 2023-06-19 DIAGNOSIS — D692 Other nonthrombocytopenic purpura: Secondary | ICD-10-CM

## 2023-06-19 DIAGNOSIS — Z8589 Personal history of malignant neoplasm of other organs and systems: Secondary | ICD-10-CM

## 2023-06-19 DIAGNOSIS — Z9181 History of falling: Secondary | ICD-10-CM

## 2023-06-19 NOTE — Progress Notes (Unsigned)
 Follow-Up Visit   Subjective  Cindy Hester is a 78 y.o. female who presents for the following: Skin Cancer Screening and Full Body Skin Exam Hx of fall last Wednesday  Hx of aks, hx of scc at scalp  The patient presents for Total-Body Skin Exam (TBSE) for skin cancer screening and mole check. The patient has spots, moles and lesions to be evaluated, some may be new or changing and the patient may have concern these could be cancer.  The following portions of the chart were reviewed this encounter and updated as appropriate: medications, allergies, medical history  Review of Systems:  No other skin or systemic complaints except as noted in HPI or Assessment and Plan.  Objective  Well appearing patient in no apparent distress; mood and affect are within normal limits.  A full examination was performed including scalp, head, eyes, ears, nose, lips, neck, chest, axillae, abdomen, back, buttocks, bilateral upper extremities, bilateral lower extremities, hands, feet, fingers, toes, fingernails, and toenails. All findings within normal limits unless otherwise noted below.   Relevant physical exam findings are noted in the Assessment and Plan.   Assessment & Plan   SKIN CANCER SCREENING PERFORMED TODAY.  ACTINIC DAMAGE - Chronic condition, secondary to cumulative UV/sun exposure - diffuse scaly erythematous macules with underlying dyspigmentation - Recommend daily broad spectrum sunscreen SPF 30+ to sun-exposed areas, reapply every 2 hours as needed.  - Staying in the shade or wearing long sleeves, sun glasses (UVA+UVB protection) and wide brim hats (4-inch brim around the entire circumference of the hat) are also recommended for sun protection.  - Call for new or changing lesions.  LENTIGINES, SEBORRHEIC KERATOSES, HEMANGIOMAS - Benign normal skin lesions - Benign-appearing - Call for any changes  MELANOCYTIC NEVI - Tan-brown and/or pink-flesh-colored symmetric macules and  papules - Benign appearing on exam today - Observation - Call clinic for new or changing moles - Recommend daily use of broad spectrum spf 30+ sunscreen to sun-exposed areas.  Purpura - Chronic; persistent and recurrent.  Treatable, but not curable. 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 Gideon Kussmaul - No evidence of recurrence today - No lymphadenopathy - Recommend regular full body skin exams - Recommend daily broad spectrum sunscreen SPF 30+ to sun-exposed areas, reapply every 2 hours as needed.  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 Gideon Kussmaul  - No evidence of recurrence today - Recommend regular full body skin exams - Recommend daily broad spectrum sunscreen SPF 30+ to sun-exposed areas, reapply every 2 hours as needed.  - Call if any new or changing lesions are noted between office visits  - R post parietal mohs 03/29/23   Return for reschedule for 6 week botox, october  sun exposed areas and botox follow up .  Laurelyn Ponder, CMA, am acting as  scribe for Celine Collard, MD.   Documentation: I have reviewed the above documentation for accuracy and completeness, and I agree with the above.  Celine Collard, MD

## 2023-06-19 NOTE — Patient Instructions (Addendum)
 Seborrheic Keratosis  What causes seborrheic keratoses? Seborrheic keratoses are harmless, common skin growths that first appear during adult life.  As time goes by, more growths appear.  Some people may develop a large number of them.  Seborrheic keratoses appear on both covered and uncovered body parts.  They are not caused by sunlight.  The tendency to develop seborrheic keratoses can be inherited.  They vary in color from skin-colored to gray, brown, or even black.  They can be either smooth or have a rough, warty surface.   Seborrheic keratoses are superficial and look as if they were stuck on the skin.  Under the microscope this type of keratosis looks like layers upon layers of skin.  That is why at times the top layer may seem to fall off, but the rest of the growth remains and re-grows.    Treatment Seborrheic keratoses do not need to be treated, but can easily be removed in the office.  Seborrheic keratoses often cause symptoms when they rub on clothing or jewelry.  Lesions can be in the way of shaving.  If they become inflamed, they can cause itching, soreness, or burning.  Removal of a seborrheic keratosis can be accomplished by freezing, burning, or surgery. If any spot bleeds, scabs, or grows rapidly, please return to have it checked, as these can be an indication of a skin cancer.   Melanoma ABCDEs  Melanoma is the most dangerous type of skin cancer, and is the leading cause of death from skin disease.  You are more likely to develop melanoma if you: Have light-colored skin, light-colored eyes, or red or blond hair Spend a lot of time in the sun Tan regularly, either outdoors or in a tanning bed Have had blistering sunburns, especially during childhood Have a close family member who has had a melanoma Have atypical moles or large birthmarks  Early detection of melanoma is key since treatment is typically straightforward and cure rates are extremely high if we catch it early.    The first sign of melanoma is often a change in a mole or a new dark spot.  The ABCDE system is a way of remembering the signs of melanoma.  A for asymmetry:  The two halves do not match. B for border:  The edges of the growth are irregular. C for color:  A mixture of colors are present instead of an even brown color. D for diameter:  Melanomas are usually (but not always) greater than 6mm - the size of a pencil eraser. E for evolution:  The spot keeps changing in size, shape, and color.  Please check your skin once per month between visits. You can use a small mirror in front and a large mirror behind you to keep an eye on the back side or your body.   If you see any new or changing lesions before your next follow-up, please call to schedule a visit.  Please continue daily skin protection including broad spectrum sunscreen SPF 30+ to sun-exposed areas, reapplying every 2 hours as needed when you're outdoors.   Staying in the shade or wearing long sleeves, sun glasses (UVA+UVB protection) and wide brim hats (4-inch brim around the entire circumference of the hat) are also recommended for sun protection.    Due to recent changes in healthcare laws, you may see results of your pathology and/or laboratory studies on MyChart before the doctors have had a chance to review them. We understand that in some cases there may  be results that are confusing or concerning to you. Please understand that not all results are received at the same time and often the doctors may need to interpret multiple results in order to provide you with the best plan of care or course of treatment. Therefore, we ask that you please give [place] 2 business days to thoroughly review all your results before contacting the office for clarification. Should we see a critical lab result, you will be contacted sooner.   If You Need Anything After Your Visit  If you have any questions or concerns for your doctor, please call our main  line at [phone] and press option 4 to reach your doctor's medical assistant. If no one answers, please leave a voicemail as directed and we will return your call as soon as possible. Messages left after 4 pm will be answered the following business day.   You may also send [place] a message via MyChart. We typically respond to MyChart messages within 1-2 business days.  For prescription refills, please ask your pharmacy to contact our office. Our fax number is [phone].  If you have an urgent issue when the clinic is closed that cannot wait until the next business day, you can page your doctor at the number below.    Please note that while we do our best to be available for urgent issues outside of office hours, we are not available 24/7.   If you have an urgent issue and are unable to reach [place], you may choose to seek medical care at your doctor's office, retail clinic, urgent care center, or emergency room.  If you have a medical emergency, please immediately call 911 or go to the emergency department.  Pager Numbers  - Dr. Gwen Pounds: [phone]  - Dr. Roseanne Reno: [phone]  - Dr. Katrinka Blazing: [phone]   In the event of inclement weather, please call our main line at [phone] for an update on the status of any delays or closures.  Dermatology Medication Tips: Please keep the boxes that topical medications come in in order to help keep track of the instructions about where and how to use these. Pharmacies typically print the medication instructions only on the boxes and not directly on the medication tubes.   If your medication is too expensive, please contact our office at [phone] option 4 or send [place] a message through MyChart.   We are unable to tell what your co-pay for medications will be in advance as this is different depending on your insurance coverage. However, we may be able to find a substitute medication at lower cost or fill out paperwork to get insurance to cover  a needed medication.   If a prior authorization is required to get your medication covered by your insurance company, please allow [place] 1-2 business days to complete this process.  Drug prices often vary depending on where the prescription is filled and some pharmacies may offer cheaper prices.  The website www.goodrx.com contains coupons for medications through different pharmacies. The prices here do not account for what the cost may be with help from insurance (it may be cheaper with your insurance), but the website can give you the price if you did not use any insurance.  - You can print the associated coupon and take it with your prescription to the pharmacy.  - You may also stop by our office during regular business hours and pick up a GoodRx coupon card.  - If you need your prescription sent electronically to  a different pharmacy, notify our office through [facility] or by phone at [phone] option 4.     Si Usted Necesita Algo Despus de Su Visita  Tambin puede enviarnos un mensaje a travs de Clinical cytogeneticist. Por lo general respondemos a los mensajes de MyChart en el transcurso de 1 a 2 das hbiles.  Para renovar recetas, por favor pida a su farmacia que se ponga en contacto con nuestra oficina. Annie Sable de fax es [place] [phone].  Si tiene un asunto urgente cuando la clnica est cerrada y que no puede esperar hasta el siguiente da hbil, puede llamar/localizar a su doctor(a) al nmero que aparece a continuacin.   Por favor, tenga en cuenta que aunque hacemos todo lo posible para estar disponibles para asuntos urgentes fuera del horario de [place], no estamos disponibles las 24 horas del da, los 7 [address]  [address] de la [place].   Si tiene un problema urgente y no puede comunicarse con nosotros, puede optar por buscar atencin mdica  en el consultorio de su doctor(a), en una clnica privada, en un centro de atencin urgente o en una sala de emergencias.  Si tiene Psychologist, clinical, por favor llame inmediatamente al 911 o vaya a la sala de emergencias.  Nmeros de bper  - Dr. Gwen Pounds: [phone]  - Dra. Roseanne Reno: [phone]  - Dr. Katrinka Blazing: [phone]   En caso de inclemencias del tiempo, por favor llame a Lacy Duverney principal al [phone] para una actualizacin sobre el [place] de cualquier retraso o cierre.  Consejos para la medicacin en dermatologa: Por favor, guarde las cajas en las que vienen los medicamentos de uso tpico para ayudarle a seguir las instrucciones sobre dnde y cmo usarlos. Las farmacias generalmente imprimen las instrucciones del medicamento slo en las cajas y no directamente en los tubos del [place].   Si su medicamento es muy caro, por favor, pngase en contacto con Rolm Gala llamando al [phone] y presione la opcin 4 o envenos un mensaje a travs de Clinical cytogeneticist.   No podemos decirle cul ser su copago por los medicamentos por adelantado ya que esto es diferente dependiendo de la cobertura de su seguro. Sin embargo, es posible que podamos encontrar un medicamento sustituto a Audiological scientist un formulario para que el seguro cubra el medicamento que se considera necesario.   Si se requiere una autorizacin previa para que su compaa de seguros [place] su medicamento, por favor permtanos de 1 a 2 das hbiles para completar [address]  Los precios de los medicamentos varan con frecuencia dependiendo del Environmental consultant de dnde se surte la receta y alguna farmacias pueden ofrecer precios ms baratos.  El sitio web www.goodrx.com tiene cupones para medicamentos de Health and safety inspector. Los precios aqu no tienen en cuenta lo que podra costar con la ayuda del seguro (puede ser ms barato con su seguro), pero el sitio web puede darle el precio si no utiliz Tourist information centre manager.  - Puede imprimir el cupn correspondiente y llevarlo con su receta a la farmacia.  - Tambin puede pasar por nuestra oficina durante el horario de  atencin regular y Education officer, museum una tarjeta de cupones de GoodRx.  - Si necesita que su receta se enve electrnicamente a una farmacia diferente, informe a nuestra oficina a travs de MyChart de [place] o por telfono llamando al [phone] y presione la opcin 4.

## 2023-06-20 ENCOUNTER — Encounter: Payer: Self-pay | Admitting: Dermatology

## 2023-06-20 DIAGNOSIS — J3089 Other allergic rhinitis: Secondary | ICD-10-CM | POA: Diagnosis not present

## 2023-06-20 DIAGNOSIS — J301 Allergic rhinitis due to pollen: Secondary | ICD-10-CM | POA: Diagnosis not present

## 2023-06-20 DIAGNOSIS — J3081 Allergic rhinitis due to animal (cat) (dog) hair and dander: Secondary | ICD-10-CM | POA: Diagnosis not present

## 2023-06-24 ENCOUNTER — Ambulatory Visit: Payer: BC Managed Care – PPO | Admitting: Dermatology

## 2023-06-25 DIAGNOSIS — J3089 Other allergic rhinitis: Secondary | ICD-10-CM | POA: Diagnosis not present

## 2023-06-25 DIAGNOSIS — J301 Allergic rhinitis due to pollen: Secondary | ICD-10-CM | POA: Diagnosis not present

## 2023-06-26 ENCOUNTER — Other Ambulatory Visit: Payer: Self-pay | Admitting: Cardiology

## 2023-06-26 ENCOUNTER — Other Ambulatory Visit: Payer: Self-pay | Admitting: Family Medicine

## 2023-06-26 DIAGNOSIS — H5213 Myopia, bilateral: Secondary | ICD-10-CM | POA: Diagnosis not present

## 2023-06-26 DIAGNOSIS — G629 Polyneuropathy, unspecified: Secondary | ICD-10-CM

## 2023-06-28 ENCOUNTER — Other Ambulatory Visit: Payer: Self-pay | Admitting: Cardiology

## 2023-06-28 DIAGNOSIS — I10 Essential (primary) hypertension: Secondary | ICD-10-CM

## 2023-07-02 ENCOUNTER — Ambulatory Visit: Payer: BC Managed Care – PPO | Admitting: Dermatology

## 2023-07-02 DIAGNOSIS — J301 Allergic rhinitis due to pollen: Secondary | ICD-10-CM | POA: Diagnosis not present

## 2023-07-02 DIAGNOSIS — J3089 Other allergic rhinitis: Secondary | ICD-10-CM | POA: Diagnosis not present

## 2023-07-02 DIAGNOSIS — J3081 Allergic rhinitis due to animal (cat) (dog) hair and dander: Secondary | ICD-10-CM | POA: Diagnosis not present

## 2023-07-08 ENCOUNTER — Ambulatory Visit (INDEPENDENT_AMBULATORY_CARE_PROVIDER_SITE_OTHER): Payer: BC Managed Care – PPO | Admitting: Dermatology

## 2023-07-08 ENCOUNTER — Encounter: Payer: Self-pay | Admitting: Dermatology

## 2023-07-08 DIAGNOSIS — L988 Other specified disorders of the skin and subcutaneous tissue: Secondary | ICD-10-CM

## 2023-07-08 NOTE — Progress Notes (Signed)
   Follow-Up Visit   Subjective  Cindy Hester is a 78 y.o. female who presents for the following: pt presents for facial elastosis, interested in fillers to nasal labials, oral commissures , chin The patient has spots, moles and lesions to be evaluated, some may be new or changing and the patient may have concern these could be cancer.   The following portions of the chart were reviewed this encounter and updated as appropriate: medications, allergies, medical history  Review of Systems:  No other skin or systemic complaints except as noted in HPI or Assessment and Plan.  Objective  Well appearing patient in no apparent distress; mood and affect are within normal limits.   A focused examination was performed of the following areas: face  Relevant exam findings are noted in the Assessment and Plan.   Before filler             After filler             Injection map   Assessment & Plan   FACIAL ELASTOSIS Exam: Rhytides and volume loss.  Treatment Plan: Restylane Refyne 1cc injected today to nasolabial folds Restylane Defyne 1cc injected to marionette lines, chin  Recommend daily broad spectrum sunscreen SPF 30+ to sun-exposed areas, reapply every 2 hours as needed. Call for new or changing lesions.  Staying in the shade or wearing long sleeves, sun glasses (UVA+UVB protection) and wide brim hats (4-inch brim around the entire circumference of the hat) are also recommended for sun protection.    Prior to the procedure, the patient's past medical history, allergies and the rare but potential risks and complications were reviewed with the patient and a signed consent was obtained. Pre and post-treatment care was discussed and instructions provided.  Location: nasolabial folds, marionette lines, and chin  Filler Type: Restylane refyne and Restylane defyne Restylane refyne lot [id number] exp 06/04/2024, Restylane Defyne lot [id number] exp 11/05/2023  Procedure:  The area was prepped thoroughly with Puracyn. After introducing the needle into the desired treatment area, the syringe plunger was drawn back to ensure there was no flash of blood prior to injecting the filler in order to minimize risk of intravascular injection and vascular occlusion. After injection of the filler, the treated areas were cleansed and iced to reduce swelling. Post-treatment instructions were reviewed with the patient.       Patient tolerated the procedure well. The patient will call with any problems, questions or concerns prior to their next appointment.     Return for schedule appt for hair loss, 37m filler with Dr. Annette Barters.  I, Rollie Clipper, RMA, am acting as scribe for Artemio Larry, MD .   Documentation: I have reviewed the above documentation for accuracy and completeness, and I agree with the above.  Artemio Larry, MD

## 2023-07-08 NOTE — Patient Instructions (Addendum)
 Recommend minoxidil 5% (Rogaine for men) solution or foam to be applied to the scalp and left in. This should ideally be used twice daily for best results but it helps with hair regrowth when used at least three times per week. Rogaine initially can cause increased hair shedding for the first few weeks but this will stop with continued use. In studies, people who used minoxidil (Rogaine) for at least 6 months had thicker hair than people who did not. Minoxidil topical (Rogaine) only works as long as it continues to be used. If if it is no longer used then the hair it has been helping to regrow can fall out. Minoxidil topical (Rogaine) can cause increased facial hair growth which can usually be managed easily with a battery-operated hair trimmer. If facial hair growth is bothersome, switching to the 2% women's version can decrease the risk of unwanted facial hair growth.        Due to recent changes in healthcare laws, you may see results of your pathology and/or laboratory studies on MyChart before the doctors have had a chance to review them. We understand that in some cases there may be results that are confusing or concerning to you. Please understand that not all results are received at the same time and often the doctors may need to interpret multiple results in order to provide you with the best plan of care or course of treatment. Therefore, we ask that you please give [place] 2 business days to thoroughly review all your results before contacting the office for clarification. Should we see a critical lab result, you will be contacted sooner.   If You Need Anything After Your Visit  If you have any questions or concerns for your doctor, please call our main line at [phone] and press option 4 to reach your doctor's medical assistant. If no one answers, please leave a voicemail as directed and we will return your call as soon as possible. Messages left after 4 pm will be answered the following  business day.   You may also send [place] a message via MyChart. We typically respond to MyChart messages within 1-2 business days.  For prescription refills, please ask your pharmacy to contact our office. Our fax number is [phone].  If you have an urgent issue when the clinic is closed that cannot wait until the next business day, you can page your doctor at the number below.    Please note that while we do our best to be available for urgent issues outside of office hours, we are not available 24/7.   If you have an urgent issue and are unable to reach [place], you may choose to seek medical care at your doctor's office, retail clinic, urgent care center, or emergency room.  If you have a medical emergency, please immediately call 911 or go to the emergency department.  Pager Numbers  - Dr. Gwen Pounds: [phone]  - Dr. Roseanne Reno: [phone]  - Dr. Katrinka Blazing: [phone]   In the event of inclement weather, please call our main line at [phone] for an update on the status of any delays or closures.  Dermatology Medication Tips: Please keep the boxes that topical medications come in in order to help keep track of the instructions about where and how to use these. Pharmacies typically print the medication instructions only on the boxes and not directly on the medication tubes.   If your medication is too expensive, please contact our office at [phone] option 4 or send  [place] a message through MyChart.   We are unable to tell what your co-pay for medications will be in advance as this is different depending on your insurance coverage. However, we may be able to find a substitute medication at lower cost or fill out paperwork to get insurance to cover a needed medication.   If a prior authorization is required to get your medication covered by your insurance company, please allow [place] 1-2 business days to complete this process.  Drug prices often vary depending on where the prescription is  filled and some pharmacies may offer cheaper prices.  The website www.goodrx.com contains coupons for medications through different pharmacies. The prices here do not account for what the cost may be with help from insurance (it may be cheaper with your insurance), but the website can give you the price if you did not use any insurance.  - You can print the associated coupon and take it with your prescription to the pharmacy.  - You may also stop by our office during regular business hours and pick up a GoodRx coupon card.  - If you need your prescription sent electronically to a different pharmacy, notify our office through [facility] or by phone at [phone] option 4.     Si Usted Necesita Algo Despus de Su Visita  Tambin puede enviarnos un mensaje a travs de Clinical cytogeneticist. Por lo general respondemos a los mensajes de MyChart en el transcurso de 1 a 2 das hbiles.  Para renovar recetas, por favor pida a su farmacia que se ponga en contacto con nuestra oficina. Annie Sable de fax es [place] [phone].  Si tiene un asunto urgente cuando la clnica est cerrada y que no puede esperar hasta el siguiente da hbil, puede llamar/localizar a su doctor(a) al nmero que aparece a continuacin.   Por favor, tenga en cuenta que aunque hacemos todo lo posible para estar disponibles para asuntos urgentes fuera del horario de [place], no estamos disponibles las 24 horas del da, los 7 [address]  [address] de la [place].   Si tiene un problema urgente y no puede comunicarse con nosotros, puede optar por buscar atencin mdica  en el consultorio de su doctor(a), en una clnica privada, en un centro de atencin urgente o en una sala de emergencias.  Si tiene Engineer, drilling, por favor llame inmediatamente al 911 o vaya a la sala de emergencias.  Nmeros de bper  - Dr. Gwen Pounds: [phone]  - Dra. Roseanne Reno: [phone]  - Dr. Katrinka Blazing: [phone]   En caso de inclemencias del tiempo, por favor  llame a Lacy Duverney principal al [phone] para una actualizacin sobre el [place] de cualquier retraso o cierre.  Consejos para la medicacin en dermatologa: Por favor, guarde las cajas en las que vienen los medicamentos de uso tpico para ayudarle a seguir las instrucciones sobre dnde y cmo usarlos. Las farmacias generalmente imprimen las instrucciones del medicamento slo en las cajas y no directamente en los tubos del [place].   Si su medicamento es muy caro, por favor, pngase en contacto con Rolm Gala llamando al [phone] y presione la opcin 4 o envenos un mensaje a travs de Clinical cytogeneticist.   No podemos decirle cul ser su copago por los medicamentos por adelantado ya que esto es diferente dependiendo de la cobertura de su seguro. Sin embargo, es posible que podamos encontrar un medicamento sustituto a Audiological scientist un formulario para que el seguro cubra el medicamento que se considera necesario.   Si  se requiere una autorizacin previa para que su compaa de seguros [place] su medicamento, por favor permtanos de 1 a 2 das hbiles para completar [address]  Los precios de los medicamentos varan con frecuencia dependiendo del Environmental consultant de dnde se surte la receta y alguna farmacias pueden ofrecer precios ms baratos.  El sitio web www.goodrx.com tiene cupones para medicamentos de Health and safety inspector. Los precios aqu no tienen en cuenta lo que podra costar con la ayuda del seguro (puede ser ms barato con su seguro), pero el sitio web puede darle el precio si no utiliz Tourist information centre manager.  - Puede imprimir el cupn correspondiente y llevarlo con su receta a la farmacia.  - Tambin puede pasar por nuestra oficina durante el horario de atencin regular y Education officer, museum una tarjeta de cupones de GoodRx.  - Si necesita que su receta se enve electrnicamente a una farmacia diferente, informe a nuestra oficina a travs de MyChart de [place] o por telfono llamando al [phone]  y presione la opcin 4.

## 2023-07-11 ENCOUNTER — Ambulatory Visit
Admission: RE | Admit: 2023-07-11 | Discharge: 2023-07-11 | Disposition: A | Discharge status: Home / Self Care | Source: Ambulatory Visit | Attending: Obstetrics and Gynecology | Admitting: Obstetrics and Gynecology

## 2023-07-11 DIAGNOSIS — Z1231 Encounter for screening mammogram for malignant neoplasm of breast: Secondary | ICD-10-CM | POA: Insufficient documentation

## 2023-07-11 DIAGNOSIS — J3081 Allergic rhinitis due to animal (cat) (dog) hair and dander: Secondary | ICD-10-CM | POA: Diagnosis not present

## 2023-07-11 DIAGNOSIS — J301 Allergic rhinitis due to pollen: Secondary | ICD-10-CM | POA: Diagnosis not present

## 2023-07-11 DIAGNOSIS — J3089 Other allergic rhinitis: Secondary | ICD-10-CM | POA: Diagnosis not present

## 2023-07-19 ENCOUNTER — Other Ambulatory Visit: Payer: Self-pay | Admitting: Family Medicine

## 2023-07-19 DIAGNOSIS — G629 Polyneuropathy, unspecified: Secondary | ICD-10-CM

## 2023-07-24 ENCOUNTER — Ambulatory Visit: Admitting: Dermatology

## 2023-07-24 DIAGNOSIS — Z85828 Personal history of other malignant neoplasm of skin: Secondary | ICD-10-CM | POA: Diagnosis not present

## 2023-07-24 DIAGNOSIS — L649 Androgenic alopecia, unspecified: Secondary | ICD-10-CM

## 2023-07-24 DIAGNOSIS — Z86007 Personal history of in-situ neoplasm of skin: Secondary | ICD-10-CM

## 2023-07-24 DIAGNOSIS — Z79899 Other long term (current) drug therapy: Secondary | ICD-10-CM

## 2023-07-24 MED ORDER — MINOXIDIL 2.5 MG PO TABS
2.5000 mg | ORAL_TABLET | Freq: Every day | ORAL | 4 refills | Status: DC
Start: 2023-07-24 — End: 2023-10-30

## 2023-07-24 MED ORDER — FINASTERIDE 5 MG PO TABS: 5.0000 mg | ORAL_TABLET | Freq: Every day | ORAL | 4 refills | Status: DC

## 2023-07-24 NOTE — Progress Notes (Signed)
 Follow-Up Visit   Subjective  Cindy Hester is a 78 y.o. female who presents for the following: Thinning hair, ~49yrs, did not start Minoxidil due to skin cancer on scalp, no recent illness before hair loss, pt not sure if any medications have contributed to it, pts dad with hair loss crown scalp, mom with thinning hair, pt currently on Spironolactone  25mg  prescribed from her cardiologist, no hx of breast cancer The patient has spots, moles and lesions to be evaluated, some may be new or changing and the patient may have concern these could be cancer.   The following portions of the chart were reviewed this encounter and updated as appropriate: medications, allergies, medical history  Review of Systems:  No other skin or systemic complaints except as noted in HPI or Assessment and Plan.  Objective  Well appearing patient in no apparent distress; mood and affect are within normal limits.   A focused examination was performed of the following areas: scalp  Relevant exam findings are noted in the Assessment and Plan.               Assessment & Plan   ANDROGENETIC ALOPECIA (FEMALE PATTERN HAIR LOSS) Labs from 11/07/22, 04/27/22 viewed- nl thyroid , VitD, Hb, Ferritin BP 136/65 Exam: Diffuse thinning of the crown and widening of the midline part with retention of the frontal hairline  Chronic and persistent condition with duration or expected duration over one year. Condition is bothersome/symptomatic for patient. Currently flared.    Female Androgenic Alopecia is a chronic condition related to genetics and/or hormonal changes.  In women androgenetic alopecia is commonly associated with menopause but may occur any time after puberty.  It causes hair thinning primarily on the crown with widening of the part and temporal hairline recession.  Can use OTC Rogaine (minoxidil) 5% solution/foam as directed.  Oral treatments in female patients who have no contraindication may  include : - Low dose oral minoxidil 1.25 - 5mg  daily - Spironolactone  50 - 100mg  bid - Finasteride 2.5 - 5 mg daily Adjunctive therapies include: - Low Level Laser Light Therapy (LLLT) - Platelet-rich plasma injections (PRP) - Hair Transplants or scalp reduction   Treatment Plan: Start Finasteride 5mg  start out 1/2 pill qd for 4 weeks if no s/e can increase to 5mg  1 po qd Start Minoxidil 2.5mg , start out 1/2 po qd for 4 weeks, if no s/e can increase to 2.5mg  1 po qd  Discussed will take 3-6 months to start seeing improvement.  Doses of oral minoxidil for hair loss are considered 'low dose'. This is because the doses used for hair loss are much lower than the doses which are used for conditions such as high blood pressure (hypertension). The doses used for hypertension are 10-40mg  per day.  Side effects are uncommon at the low doses (up to 2.5 mg/day) used to treat hair loss. Potential side effects, more commonly seen at higher doses, include: Increase in hair growth (hypertrichosis) elsewhere on face and body Temporary hair shedding upon starting medication which may last up to 4 weeks Ankle swelling, fluid retention, rapid weight gain more than 5 pounds Low blood pressure and feeling lightheaded or dizzy when standing up quickly Fast or irregular heartbeat Headaches   Long term medication management.  Patient is using long term (months to years) prescription medication  to control their dermatologic condition.  These medications require periodic monitoring to evaluate for efficacy and side effects and may require periodic laboratory monitoring.  HISTORY OF SQUAMOUS CELL CARCINOMA OF THE SKIN - No evidence of recurrence today- Vertex scalp- Mohs - Recommend regular full body skin exams - Recommend daily broad spectrum sunscreen SPF 30+ to sun-exposed areas, reapply every 2 hours as needed.  - Call if any new or changing lesions are noted between office visits   HISTORY OF SQUAMOUS  CELL CARCINOMA IN SITU OF THE SKIN - No evidence of recurrence today- R post parietal scalp- Mohs - Recommend regular full body skin exams - Recommend daily broad spectrum sunscreen SPF 30+ to sun-exposed areas, reapply every 2 hours as needed.  - Call if any new or changing lesions are noted between office visits        Return in about 3 months (around 10/24/2023) for Alopecia f/u.  I, Rollie Clipper, RMA, am acting as scribe for Artemio Larry, MD .   Documentation: I have reviewed the above documentation for accuracy and completeness, and I agree with the above.  Artemio Larry, MD

## 2023-07-24 NOTE — Patient Instructions (Addendum)
 Start Minoxidil 2.5mg  TAKE 1/2 A PILL A DAY for 4 weeks, if you have no problems you can increase to the full pill daily Start Finasteride 5mg  1/2 a pill a day for 4 weeks, if no issues or side effects can increase to 1 full pill a day    Due to recent changes in healthcare laws, you may see results of your pathology and/or laboratory studies on MyChart before the doctors have had a chance to review them. We understand that in some cases there may be results that are confusing or concerning to you. Please understand that not all results are received at the same time and often the doctors may need to interpret multiple results in order to provide you with the best plan of care or course of treatment. Therefore, we ask that you please give us  2 business days to thoroughly review all your results before contacting the office for clarification. Should we see a critical lab result, you will be contacted sooner.   If You Need Anything After Your Visit  If you have any questions or concerns for your doctor, please call our main line at [phone] and press option 4 to reach your doctor's medical assistant. If no one answers, please leave a voicemail as directed and we will return your call as soon as possible. Messages left after 4 pm will be answered the following business day.   You may also send us  a message via MyChart. We typically respond to MyChart messages within 1-2 business days.  For prescription refills, please ask your pharmacy to contact our office. Our fax number is [phone].  If you have an urgent issue when the clinic is closed that cannot wait until the next business day, you can page your doctor at the number below.    Please note that while we do our best to be available for urgent issues outside of office hours, we are not available 24/7.   If you have an urgent issue and are unable to reach us , you may choose to seek medical care at your doctor's office, retail clinic, urgent  care center, or emergency room.  If you have a medical emergency, please immediately call 911 or go to the emergency department.  Pager Numbers  - Dr. Bary Likes: [phone]  - Dr. Annette Barters: [phone]  - Dr. Felipe Horton: [phone]   In the event of inclement weather, please call our main line at [phone] for an update on the status of any delays or closures.  Dermatology Medication Tips: Please keep the boxes that topical medications come in in order to help keep track of the instructions about where and how to use these. Pharmacies typically print the medication instructions only on the boxes and not directly on the medication tubes.   If your medication is too expensive, please contact our office at [phone] option 4 or send us  a message through MyChart.   We are unable to tell what your co-pay for medications will be in advance as this is different depending on your insurance coverage. However, we may be able to find a substitute medication at lower cost or fill out paperwork to get insurance to cover a needed medication.   If a prior authorization is required to get your medication covered by your insurance company, please allow us  1-2 business days to complete this process.  Drug prices often vary depending on where the prescription is filled and some pharmacies may offer cheaper prices.  The website www.goodrx.com contains coupons for  medications through different pharmacies. The prices here do not account for what the cost may be with help from insurance (it may be cheaper with your insurance), but the website can give you the price if you did not use any insurance.  - You can print the associated coupon and take it with your prescription to the pharmacy.  - You may also stop by our office during regular business hours and pick up a GoodRx coupon card.  - If you need your prescription sent electronically to a different pharmacy, notify our office through [facility]  or by phone at [phone] option 4.     Si Usted Necesita Algo Despus de Su Visita  Tambin puede enviarnos un mensaje a travs de Clinical cytogeneticist. Por lo general respondemos a los mensajes de MyChart en el transcurso de 1 a 2 das hbiles.  Para renovar recetas, por favor pida a su farmacia que se ponga en contacto con nuestra oficina. Franz Jacks de fax es [place] [phone].  Si tiene un asunto urgente cuando la clnica est cerrada y que no puede esperar hasta el siguiente da hbil, puede llamar/localizar a su doctor(a) al nmero que aparece a continuacin.   Por favor, tenga en cuenta que aunque hacemos todo lo posible para estar disponibles para asuntos urgentes fuera del horario de [place], no estamos disponibles las 24 horas del da, los 7 [address]  [address] de la [place].   Si tiene un problema urgente y no puede comunicarse con nosotros, puede optar por buscar atencin mdica  en el consultorio de su doctor(a), en una clnica privada, en un centro de atencin urgente o en una sala de emergencias.  Si tiene Engineer, drilling, por favor llame inmediatamente al 911 o vaya a la sala de emergencias.  Nmeros de bper  - Dr. Bary Likes: [phone]  - Dra. Annette Barters: [phone]  - Dr. Felipe Horton: [phone]   En caso de inclemencias del tiempo, por favor llame a L[place]uan Pila principal al [phone] para una actualizacin sobre el [place] de cualquier retraso o cierre.  Consejos para la medicacin en dermatologa: Por favor, guarde las cajas en las que vienen los medicamentos de uso tpico para ayudarle a seguir las instrucciones sobre dnde y cmo usarlos. Las farmacias generalmente imprimen las instrucciones del medicamento slo en las cajas y no directamente en los tubos del [place].   Si su medicamento es muy caro, por favor, pngase en contacto con Bettyjane Brunet llamando al [phone] y presione la opcin 4 o envenos un mensaje a travs de Clinical cytogeneticist.   No podemos decirle cul ser su  copago por los medicamentos por adelantado ya que esto es diferente dependiendo de la cobertura de su seguro. Sin embargo, es posible que podamos encontrar un medicamento sustituto a Audiological scientist un formulario para que el seguro cubra el medicamento que se considera necesario.   Si se requiere una autorizacin previa para que su compaa de seguros [place] su medicamento, por favor permtanos de 1 a 2 das hbiles para completar este proceso.  Los precios de los medicamentos varan con frecuencia dependiendo del Environmental consultant de dnde se surte la receta y alguna farmacias pueden ofrecer precios ms baratos.  El sitio web www.goodrx.com tiene cupones para medicamentos de Health and safety inspector. Los precios aqu no tienen en cuenta lo que podra costar con la ayuda del seguro (puede ser ms barato con su seguro), pero el sitio web puede darle el precio si no utiliz Tourist information centre manager.  - Puede imprimir el cupn correspondiente  y llevarlo con su receta a la farmacia.  - Tambin puede pasar por nuestra oficina durante el horario de atencin regular y Education officer, museum una tarjeta de cupones de GoodRx.  - Si necesita que su receta se enve electrnicamente a una farmacia diferente, informe a nuestra oficina a travs de MyChart de [place] o por telfono llamando al [phone] y presione la opcin 4.

## 2023-07-26 DIAGNOSIS — J3081 Allergic rhinitis due to animal (cat) (dog) hair and dander: Secondary | ICD-10-CM | POA: Diagnosis not present

## 2023-07-26 DIAGNOSIS — J301 Allergic rhinitis due to pollen: Secondary | ICD-10-CM | POA: Diagnosis not present

## 2023-07-26 DIAGNOSIS — J3089 Other allergic rhinitis: Secondary | ICD-10-CM | POA: Diagnosis not present

## 2023-07-30 ENCOUNTER — Encounter: Payer: Self-pay | Admitting: Dermatology

## 2023-07-30 ENCOUNTER — Ambulatory Visit (INDEPENDENT_AMBULATORY_CARE_PROVIDER_SITE_OTHER): Payer: Self-pay | Admitting: Dermatology

## 2023-07-30 DIAGNOSIS — L988 Other specified disorders of the skin and subcutaneous tissue: Secondary | ICD-10-CM

## 2023-07-30 NOTE — Patient Instructions (Addendum)

## 2023-07-30 NOTE — Progress Notes (Signed)
   Follow-Up Visit   Subjective  Cindy Hester is a 78 y.o. female who presents for the following: Botox for facial elastosis  The following portions of the chart were reviewed this encounter and updated as appropriate: medications, allergies, medical history  Review of Systems:  No other skin or systemic complaints except as noted in HPI or Assessment and Plan.  Objective  Well appearing patient in no apparent distress; mood and affect are within normal limits.  A focused examination was performed of the face.  Relevant physical exam findings are noted in the Assessment and Plan.      Assessment & Plan    Facial Elastosis  Location: See attached image  Informed consent: Discussed risks (infection, pain, bleeding, bruising, swelling, allergic reaction, paralysis of nearby muscles, eyelid droop, double vision, neck weakness, difficulty breathing, headache, undesirable cosmetic result, and need for additional treatment) and benefits of the procedure, as well as the alternatives.  Informed consent was obtained.  Preparation: The area was cleansed with alcohol.  Procedure Details:  Botox was injected into the dermis with a 30-gauge needle. Pressure applied to any bleeding. Ice packs offered for swelling.  Lot Number:  [id number] Expiration:  05/2025  Total Units Injected:  48  Plan: Tylenol  may be used for headache.  Allow 2 weeks before returning to clinic for additional dosing as needed. Patient will call for any problems.    Return for 3 - 4 month botox .  IEleanor Blush, CMA, am acting as scribe for Alm Rhyme, MD.   Documentation: I have reviewed the above documentation for accuracy and completeness, and I agree with the above.  Alm Rhyme, MD

## 2023-07-31 DIAGNOSIS — E119 Type 2 diabetes mellitus without complications: Secondary | ICD-10-CM | POA: Diagnosis not present

## 2023-07-31 DIAGNOSIS — E785 Hyperlipidemia, unspecified: Secondary | ICD-10-CM | POA: Diagnosis not present

## 2023-07-31 DIAGNOSIS — E1169 Type 2 diabetes mellitus with other specified complication: Secondary | ICD-10-CM | POA: Diagnosis not present

## 2023-07-31 DIAGNOSIS — E1159 Type 2 diabetes mellitus with other circulatory complications: Secondary | ICD-10-CM | POA: Diagnosis not present

## 2023-08-02 DIAGNOSIS — J301 Allergic rhinitis due to pollen: Secondary | ICD-10-CM | POA: Diagnosis not present

## 2023-08-02 DIAGNOSIS — J3089 Other allergic rhinitis: Secondary | ICD-10-CM | POA: Diagnosis not present

## 2023-08-02 DIAGNOSIS — J3081 Allergic rhinitis due to animal (cat) (dog) hair and dander: Secondary | ICD-10-CM | POA: Diagnosis not present

## 2023-08-08 DIAGNOSIS — J3089 Other allergic rhinitis: Secondary | ICD-10-CM | POA: Diagnosis not present

## 2023-08-08 DIAGNOSIS — J3081 Allergic rhinitis due to animal (cat) (dog) hair and dander: Secondary | ICD-10-CM | POA: Diagnosis not present

## 2023-08-08 DIAGNOSIS — J301 Allergic rhinitis due to pollen: Secondary | ICD-10-CM | POA: Diagnosis not present

## 2023-08-21 ENCOUNTER — Encounter: Payer: Self-pay | Admitting: Family Medicine

## 2023-08-21 ENCOUNTER — Ambulatory Visit: Payer: Self-pay | Admitting: Family Medicine

## 2023-08-21 VITALS — BP 106/68 | HR 80 | Ht 66.0 in | Wt 169.0 lb

## 2023-08-21 DIAGNOSIS — I10 Essential (primary) hypertension: Secondary | ICD-10-CM | POA: Diagnosis not present

## 2023-08-21 DIAGNOSIS — G629 Polyneuropathy, unspecified: Secondary | ICD-10-CM

## 2023-08-21 DIAGNOSIS — Z7985 Long-term (current) use of injectable non-insulin antidiabetic drugs: Secondary | ICD-10-CM | POA: Diagnosis not present

## 2023-08-21 DIAGNOSIS — E119 Type 2 diabetes mellitus without complications: Secondary | ICD-10-CM

## 2023-08-21 DIAGNOSIS — G4733 Obstructive sleep apnea (adult) (pediatric): Secondary | ICD-10-CM | POA: Diagnosis not present
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
## 2023-08-21 NOTE — Assessment & Plan Note (Addendum)
 Chronic  Well controlled and normotensive today, BP at goal  - Monitor blood pressure - continue amlodipine  5mg  every day, metoprolol  100mg  every day, valsartan -hctz 160mg -25mg ,  spironolactone  25mg  qd

## 2023-08-23 DIAGNOSIS — J3089 Other allergic rhinitis: Secondary | ICD-10-CM | POA: Diagnosis not present

## 2023-08-23 DIAGNOSIS — J3081 Allergic rhinitis due to animal (cat) (dog) hair and dander: Secondary | ICD-10-CM | POA: Diagnosis not present

## 2023-08-23 DIAGNOSIS — J301 Allergic rhinitis due to pollen: Secondary | ICD-10-CM | POA: Diagnosis not present

## 2023-08-31 DIAGNOSIS — S022XXA Fracture of nasal bones, initial encounter for closed fracture: Secondary | ICD-10-CM | POA: Diagnosis not present

## 2023-08-31 DIAGNOSIS — W1809XA Striking against other object with subsequent fall, initial encounter: Secondary | ICD-10-CM | POA: Diagnosis not present

## 2023-08-31 DIAGNOSIS — Z743 Need for continuous supervision: Secondary | ICD-10-CM | POA: Diagnosis not present

## 2023-08-31 DIAGNOSIS — S0003XA Contusion of scalp, initial encounter: Secondary | ICD-10-CM | POA: Diagnosis not present

## 2023-08-31 DIAGNOSIS — T1490XA Injury, unspecified, initial encounter: Secondary | ICD-10-CM | POA: Diagnosis not present

## 2023-08-31 DIAGNOSIS — S0990XA Unspecified injury of head, initial encounter: Secondary | ICD-10-CM | POA: Diagnosis not present

## 2023-08-31 DIAGNOSIS — S8002XA Contusion of left knee, initial encounter: Secondary | ICD-10-CM | POA: Diagnosis not present

## 2023-08-31 DIAGNOSIS — W1830XA Fall on same level, unspecified, initial encounter: Secondary | ICD-10-CM | POA: Diagnosis not present

## 2023-09-05 ENCOUNTER — Ambulatory Visit: Payer: Self-pay

## 2023-09-05 NOTE — Telephone Encounter (Signed)
 Patient call note and symptoms reviewed. Agree with scheduled appt. Will evaluate during OV

## 2023-09-05 NOTE — Telephone Encounter (Signed)
 FYI Only or Action Required?: FYI only for provider.  Patient was last seen in primary care on 08/21/2023 by Sharma Coyer, MD.  Called Nurse Triage reporting Dizziness.  Symptoms began several days ago.  Interventions attempted: Rest, hydration, or home remedies.  Symptoms are: stable.  Triage Disposition: See PCP Within 2 Weeks  Patient/caregiver understands and will follow disposition?: Yes   **Post hospital follow-up scheduled for 8/21; if symptoms worsen, she will seek care in ED/UC**       Copied from CRM #[id number]. Topic: Clinical - Red Word Triage >> Sep 05, 2023 12:12 PM Ameerah G wrote: Red Word that prompted transfer to Nurse Triage: pt stated she has been feeling dizzy when changing positions Reason for Disposition  [1] MILD dizziness (e.g., vertigo; walking normally) AND [2] has been evaluated by doctor (or NP/PA) for this  Answer Assessment - Initial Assessment Questions 1. DESCRIPTION: Describe your dizziness.     Gets dizzy during changing positions  2. VERTIGO: Do you feel like either you or the room is spinning or tilting?      No  3. LIGHTHEADED: Do you feel lightheaded? (e.g., somewhat faint, woozy, weak upon standing)     Yes  4. SEVERITY: How bad is it?  Can you walk?     Yes, patient can ambulate, slowly   5. ONSET:  When did the dizziness begin?     Last Saturday  6. AGGRAVATING FACTORS: Does anything make it worse? (e.g., standing, change in head position)     Ambulating makes it worse  7. CAUSE: What do you think is causing the dizziness?     Dx. Of Vertigo.    8. RECURRENT SYMPTOM: Have you had dizziness before? If Yes, ask: When was the last time? What happened that time?     No   9. OTHER SYMPTOMS: Do you have any other symptoms? (e.g., earache, headache, numbness, tinnitus, vomiting, weakness) No   Patient fell last weekend, broke nose, sent to ED, CT scan normal. Advised to follow-up with PCP,  appt. For follow-up scheduled for 8/21  Protocols used: Dizziness - Vertigo-A-AH

## 2023-09-06 DIAGNOSIS — S060X1D Concussion with loss of consciousness of 30 minutes or less, subsequent encounter: Secondary | ICD-10-CM | POA: Diagnosis not present

## 2023-09-09 DIAGNOSIS — J3489 Other specified disorders of nose and nasal sinuses: Secondary | ICD-10-CM | POA: Diagnosis not present

## 2023-09-09 DIAGNOSIS — S022XXA Fracture of nasal bones, initial encounter for closed fracture: Secondary | ICD-10-CM | POA: Diagnosis not present

## 2023-09-11 DIAGNOSIS — J3489 Other specified disorders of nose and nasal sinuses: Secondary | ICD-10-CM | POA: Diagnosis not present

## 2023-09-13 DIAGNOSIS — J3081 Allergic rhinitis due to animal (cat) (dog) hair and dander: Secondary | ICD-10-CM | POA: Diagnosis not present

## 2023-09-13 DIAGNOSIS — J3089 Other allergic rhinitis: Secondary | ICD-10-CM | POA: Diagnosis not present

## 2023-09-13 DIAGNOSIS — J301 Allergic rhinitis due to pollen: Secondary | ICD-10-CM | POA: Diagnosis not present

## 2023-09-17 DIAGNOSIS — J342 Deviated nasal septum: Secondary | ICD-10-CM | POA: Diagnosis not present

## 2023-09-18 ENCOUNTER — Other Ambulatory Visit: Payer: Self-pay | Admitting: Otolaryngology

## 2023-09-19 ENCOUNTER — Encounter: Payer: Self-pay | Admitting: Otolaryngology

## 2023-09-20 ENCOUNTER — Encounter: Payer: Self-pay | Admitting: Otolaryngology

## 2023-09-20 NOTE — Anesthesia Preprocedure Evaluation (Signed)
 Anesthesia Evaluation  Patient identified by MRN, date of birth, ID band Patient awake    Reviewed: Allergy & Precautions, H&P , NPO status , Patient's Chart, lab work & pertinent test results, reviewed documented beta blocker date and time   History of Anesthesia Complications Negative for: history of anesthetic complications  Airway Mallampati: III   Neck ROM: full    Dental  (+) Caps, Dental Advidsory Given, Teeth Intact, Missing   Pulmonary neg shortness of breath, sleep apnea and Continuous Positive Airway Pressure Ventilation , neg COPD, neg recent URI   Pulmonary exam normal        Cardiovascular Exercise Tolerance: Poor hypertension, On Medications (-) angina (-) Past MI and (-) Cardiac Stents Normal cardiovascular exam+ dysrhythmias + Valvular Problems/Murmurs  Rhythm:regular Rate:Normal  03-11-21 echo 1. Left ventricular ejection fraction, by estimation, is 60 to 65%. The left ventricle has normal function. The left ventricle has no regional wall motion abnormalities. Left ventricular diastolic parameters are consistent with Grade I diastolic dysfunction (impaired relaxation).   2. Right ventricular systolic function is normal. The right ventricular size is normal.   3. The mitral valve is degenerative. No evidence of mitral valve regurgitation.   4. The aortic valve was not well visualized. Aortic valve regurgitation is not visualized.   5. The inferior vena cava is normal in size with greater than 50% respiratory variability, suggesting right atrial pressure of 3 mmHg.       Neuro/Psych neg Seizures  Neuromuscular disease  negative psych ROS   GI/Hepatic Neg liver ROS,GERD  Medicated,,  Endo/Other  diabetes, Well ControlledHypothyroidism    Renal/GU negative Renal ROS  negative genitourinary   Musculoskeletal   Abdominal   Peds  Hematology negative hematology ROS (+)   Anesthesia Other Findings Past  Medical History: No date: 1st degree AV block No date: Allergic rhinitis No date: Arthritis     Comment:  right shoulder blade, left thumb No date: Carotid arterial disease (HCC)     Comment:  a. 10/2020 U/S: RICA 16-49%, LICA 16-49%. Antegrade bilat              vertebral flow. No date: Chronic venous insufficiency No date: CKD (chronic kidney disease), stage III (HCC) No date: Diabetes mellitus without complication (HCC) No date: GERD (gastroesophageal reflux disease) No date: Grade I diastolic dysfunction No date: Heart murmur     Comment:  a. 12/2017 Echo: EF 50-55%, mild conc LVH, GrI DD, Trace              MR/TR. No date: History of esophagitis No date: History of gastritis No date: History of stress test     Comment:  a. 12/2017 MV: HTN response to exercise. EF 68%. Small,               mild, reversible inferior apical defect-->Low risk. No date: HLD (hyperlipidemia) No date: Hypertension No date: Lymphedema     Comment:  legs No date: Neuropathy No date: OSA on CPAP 01/22/2023: SCC (squamous cell carcinoma)     Comment:  Vertex scalp, mohs 02/22/2023 No date: Sleep apnea     Comment:  a. Uses CPAP 01/22/2023: Squamous cell carcinoma of skin     Comment:  SCC IS  Right posterior parietal, mohs 02/21//25 02/03/2008: Talipes cavus     Comment:  Qualifier: Diagnosis of   By: FIELDS MD, KARL      No date: Type 2 diabetes mellitus with diabetic polyneuropathy (HCC)   Reproductive/Obstetrics negative OB  ROS                              Anesthesia Physical Anesthesia Plan  ASA: 3  Anesthesia Plan: General   Post-op Pain Management:    Induction: Intravenous  PONV Risk Score and Plan: 3 and Ondansetron , Dexamethasone  and Treatment may vary due to age or medical condition  Airway Management Planned: Oral ETT and LMA  Additional Equipment:   Intra-op Plan:   Post-operative Plan: Extubation in OR  Informed Consent: I have reviewed the  patients History and Physical, chart, labs and discussed the procedure including the risks, benefits and alternatives for the proposed anesthesia with the patient or authorized representative who has indicated his/her understanding and acceptance.     Dental Advisory Given  Plan Discussed with: CRNA  Anesthesia Plan Comments:          Anesthesia Quick Evaluation

## 2023-09-23 ENCOUNTER — Encounter: Payer: Self-pay | Admitting: Anesthesiology

## 2023-09-24 ENCOUNTER — Ambulatory Visit: Attending: Emergency Medicine | Admitting: Emergency Medicine

## 2023-09-24 ENCOUNTER — Telehealth: Payer: Self-pay

## 2023-09-24 ENCOUNTER — Telehealth: Payer: Self-pay | Admitting: *Deleted

## 2023-09-24 DIAGNOSIS — Z01810 Encounter for preprocedural cardiovascular examination: Secondary | ICD-10-CM | POA: Diagnosis not present

## 2023-09-24 NOTE — Telephone Encounter (Signed)
   Name: Cindy Hester  DOB: 07-27-1945  MRN: [id number]  Primary Cardiologist: Gordy Bergamo, MD   Preoperative team, please contact this patient and set up a phone call appointment for further preoperative risk assessment. Please obtain consent and complete medication review. Thank you for your help.  I confirm that guidance regarding antiplatelet and oral anticoagulation therapy has been completed and, if necessary, noted below.  Per office protocol, if patient is without any new symptoms or concerns at the time of their virtual visit, she may hold aspirin  for 5-7 days prior to procedure. Please resume aspirin  as soon as possible postprocedure, at the discretion of the surgeon.    I also confirmed the patient resides in the state of [place] . As per [facility] telemedicine laws, the patient must reside in the state in which the provider is licensed.   [name], NP 09/24/2023, 11:37 AM [facility]

## 2023-09-24 NOTE — Telephone Encounter (Signed)
 Pt has been scheduled add on tele preop appt due to procedure date and med hold. I asked pt if she had been holding ASA. She said yes as of last Thursday 09/19/23. Pt tells me that her surgery is due to she fell and hit her face. She states she is healing from a concussion from the fall as well.   Med rec and consent are done.

## 2023-09-24 NOTE — Telephone Encounter (Signed)
 Patient is returning a call.  Please advise.

## 2023-09-24 NOTE — Telephone Encounter (Signed)
 Left message to call back to schedule tele pre op appt.

## 2023-09-24 NOTE — Telephone Encounter (Signed)
   Pre-operative Risk Assessment    Patient Name: Cindy Hester  DOB: [birth year] MRN: [id number]   Date of last office visit: 03/22/23 GORDY BERGAMO, MD Date of next office visit: NONE   Request for Surgical Clearance    Procedure:  OPEN REDUCTION NASAL / SEPTAL FX  Date of Surgery:  Clearance 09/26/23                                Surgeon:  DR EDDA Surgeon's Group or Practice Name:  [facility] Phone number:  [phone] Fax number:  [phone]   Type of Clearance Requested:   - Medical  - Pharmacy:  Hold Aspirin      Type of Anesthesia:  General    Additional requests/questions:    SignedLucie DELENA Ku   09/24/2023, 10:39 AM

## 2023-09-24 NOTE — Progress Notes (Signed)
 Virtual Visit via Telephone Note   Because of SATORI KRABILL co-morbid illnesses, she is at least at moderate risk for complications without adequate follow up.  This format is felt to be most appropriate for this patient at this time.  Due to technical limitations with video connection (technology), today's appointment will be conducted as an audio only telehealth visit, and Cindy Hester verbally agreed to proceed in this manner.   All issues noted in this document were discussed and addressed.  No physical exam could be performed with this format.  Evaluation Performed:  Preoperative cardiovascular risk assessment _____________   Date:  09/24/2023   Patient ID:  Cindy Hester, DOB 1945/05/23, MRN [id number] Patient Location:  Home Provider location:   Office  Primary Care Provider:  Sharma Coyer, MD Primary Cardiologist:  Gordy Bergamo, MD  Chief Complaint / Patient Profile   78 y.o. y/o female with a h/o type 2 diabetes, hypertension, mild hyperlipidemia, chronic venous insufficiency, mild lymphedema, stage III CKD, obstructive sleep apnea on CPAP, extensive multi vessel coronary artery calcification noted on CT, pseudoclaudication, bradycardia who is pending open reduction nasal/septal fx on 09/26/2023 with [facility] and presents today for telephonic preoperative cardiovascular risk assessment.  History of Present Illness    Cindy Hester is a 78 y.o. female who presents via audio/video conferencing for a telehealth visit today.  Pt was last seen in cardiology clinic on 03/22/2023 by Dr. Bergamo.  At that time Cindy Hester was doing well.  The patient is now pending procedure as outlined above. Since her last visit, she denies chest pain, shortness of breath, lower extremity edema, fatigue, palpitations, melena, hematuria, hemoptysis, diaphoresis, weakness, presyncope, syncope, orthopnea, and PND.  Today patient is doing well overall.  She is without any acute  cardiovascular concerns or complaints.  She denies any exertional or anginal symptoms.  She stays fairly active at her workplace and doing mild to moderate household chores without limitation.  Overall she is able to complete greater than 4 METS.  Past Medical History    Past Medical History:  Diagnosis Date   1st degree AV block    Allergic rhinitis    Arthritis    right shoulder blade, left thumb   Carotid arterial disease (HCC)    a. 10/2020 U/S: RICA 16-49%, LICA 16-49%. Antegrade bilat vertebral flow.   Chronic venous insufficiency    CKD (chronic kidney disease), stage III (HCC)    Diabetes mellitus without complication (HCC)    GERD (gastroesophageal reflux disease)    Grade I diastolic dysfunction    Heart murmur    a. 12/2017 Echo: EF 50-55%, mild conc LVH, GrI DD, Trace MR/TR.   History of esophagitis    History of gastritis    History of stress test    a. 12/2017 MV: HTN response to exercise. EF 68%. Small, mild, reversible inferior apical defect-->Low risk.   HLD (hyperlipidemia)    Hypertension    Lymphedema    legs   Neuropathy    OSA on CPAP    SCC (squamous cell carcinoma) 01/22/2023   Vertex scalp, mohs 02/22/2023   Sleep apnea    a. 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   Qualifier: Diagnosis of   By: FIELDS MD, KARL         Type 2 diabetes mellitus with diabetic polyneuropathy (HCC)  Past Surgical History:  Procedure Laterality Date   BREAST CYST EXCISION Left    removed two times   CESAREAN SECTION  1986   COLONOSCOPY N/A 07/17/2021   Procedure: COLONOSCOPY;  Surgeon: Onita Elspeth Sharper, DO;  Location: [facility];  Service: Gastroenterology;  Laterality: N/A;  DM   CYST EXCISION     from left breast   ESOPHAGOGASTRODUODENOSCOPY (EGD) WITH PROPOFOL  N/A 12/12/2015   gastritis, LA Grade A reflux esophagitis ESOPHAGOGASTRODUODENOSCOPY (EGD) WITH  PROPOFOL ;  Surgeon: Lamar ONEIDA Holmes, MD;  Location: [facility] ENDOSCOPY;  Service: Endoscopy;  Laterality: N/A;  Diabetic   HAMMER TOE SURGERY Bilateral 05/30/2016   Procedure: HAMMER TOE CORRECTION RIGHT 2ND, 3RD, 4TH, 5TH TOES LEFT 5TH TOE;  Surgeon: Eva Gay, DPM;  Location: [facility];  Service: Podiatry;  Laterality: Bilateral;   HEEL SPUR SURGERY Right 2011   NASAL SINUS SURGERY     TONSILLECTOMY     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   Procedure: TOTAL HIP ARTHROPLASTY;  Surgeon: Mardee Lynwood SQUIBB, MD;  Location: [facility] ORS;  Service: Orthopedics;  Laterality: Left;    Allergies  Allergies  Allergen Reactions   Tape     Most tapes cause redness.  Paper tape and tegaderm are OK.    Home Medications    Prior to Admission medications   Medication Sig Start Date End Date Taking? Authorizing Provider  acetaminophen  (TYLENOL ) 500 MG tablet Take 1,000 mg by mouth daily as needed. Will alternate with 650 mg, 2 tabs daily when pain increased    [provider]  amLODipine  (NORVASC ) 5 MG tablet TAKE 1 TABLET(5 MG) BY MOUTH DAILY 06/26/23   Ladona Heinz, MD  aspirin  81 MG chewable tablet Chew 81 mg by mouth daily.    [provider]  azelastine  (ASTELIN ) 0.1 % nasal spray SMARTSIG:1-2 Spray(s) Both Nares Twice Daily 01/16/22   [provider]  BIOTIN 5000 PO Take by mouth daily.    [provider]  celecoxib  (CELEBREX ) 200 MG capsule TAKE 1 CAPSULE BY MOUTH TWICE DAILY 07/19/23   Simmons-Robinson, Rockie, MD  cetirizine  (ZYRTEC ) 10 MG tablet Take 10 mg by mouth daily. 03/24/22   [provider]  cholecalciferol  (VITAMIN D ) 1000 UNITS tablet Take 5,000 Units by mouth.  Twice a week Mon, Fri    [provider]  docusate sodium  (COLACE) 100 MG capsule Take 100 mg by mouth 2 (two) times daily.    [provider]  DULoxetine  (CYMBALTA ) 60 MG capsule Take 1 capsule (60 mg total) by mouth daily. 02/21/23   Simmons-Robinson,  Makiera, MD  empagliflozin (JARDIANCE) 25 MG TABS tablet Take 25 mg by mouth daily.    [provider]  EPINEPHrine  0.3 mg/0.3 mL IJ SOAJ injection  05/14/17   [provider]  ezetimibe  (ZETIA ) 10 MG tablet TAKE 1 TABLET(10 MG) BY MOUTH DAILY AFTER SUPPER 12/11/21   Simmons-Robinson, Rockie, MD  FIBER SELECT GUMMIES PO Take by mouth.    [provider]  finasteride  (PROSCAR ) 5 MG tablet Take 1 tablet (5 mg total) by mouth daily. 07/24/23   Jackquline Sawyer, MD  furosemide  (LASIX ) 40 MG tablet TAKE 1 TABLET BY MOUTH AS NEEDED FOR SWELLING 12/10/22   Simmons-Robinson, Makiera, MD  GLUCOSAMINE-CHONDROITIN PO Take 2,000 mg by mouth daily.    [provider]  lactulose  (CHRONULAC ) 10 GM/15ML solution  10/04/21   [provider]  metoprolol  succinate (TOPROL -XL) 100 MG 24 hr tablet TAKE 1 TABLET(100 MG) BY MOUTH DAILY 06/28/23  Ladona Heinz, MD  minoxidil  (LONITEN ) 2.5 MG tablet Take 1 tablet (2.5 mg total) by mouth daily. 07/24/23   Jackquline Sawyer, MD  montelukast  (SINGULAIR ) 10 MG tablet TAKE 1 TABLET BY MOUTH DAILY 06/14/21   Bertrum Charlie CROME, MD  Multiple Vitamin (MULTIVITAMIN) tablet Take 1 tablet by mouth daily.    [provider]  NON FORMULARY CPAP    [provider]  pantoprazole  (PROTONIX ) 40 MG tablet Take 1 tablet (40 mg total) by mouth daily. 09/10/22   Simmons-Robinson, Rockie, MD  rosuvastatin  (CRESTOR ) 40 MG tablet Take 1 tablet (40 mg total) by mouth daily. 03/28/23   Ladona Heinz, MD  spironolactone  (ALDACTONE ) 25 MG tablet TAKE 1 TABLET BY MOUTH EVERY DAY 06/06/23   Ladona Heinz, MD  tirzepatide [facility]) 2.5 MG/0.5ML Pen Inject 2.5 mg into the skin once a week. 11/06/22   [provider]  valACYclovir  (VALTREX ) 500 MG tablet TAKE 1 TABLET BY MOUTH TWICE DAILY AS NEEDED 07/23/22   Simmons-Robinson, Rockie, MD  valsartan -hydrochlorothiazide  (DIOVAN -HCT) 160-25 MG tablet TAKE 1 TABLET BY MOUTH EVERY MORNING 01/25/23   Ladona Heinz, MD   vitamin B-12 (CYANOCOBALAMIN ) 1000 MCG tablet Take 2,500 mcg by mouth 2 (two) times a week. Mon, Fri    [provider]    Physical Exam    Vital Signs:  Cindy Hester does not have vital signs available for review today.  Given telephonic nature of communication, physical exam is limited. AAOx3. NAD. Normal affect.  Speech and respirations are unlabored.  Accessory Clinical Findings    None  Assessment & Plan    1.  Preoperative Cardiovascular Risk Assessment: According to the Revised Cardiac Risk Index (RCRI), her Perioperative Risk of Major Cardiac Event is (%): 0.4. Her Functional Capacity in METs is: 5.07 according to the Duke Activity Status Index (DASI). Therefore, based on ACC/AHA guidelines, patient would be at acceptable risk for the planned procedure without further cardiovascular testing.   The patient was advised that if she develops new symptoms prior to surgery to contact our office to arrange for a follow-up visit, and she verbalized understanding.  She may hold aspirin  for 5-7 days prior to procedure. Please resume aspirin  as soon as possible postprocedure, at the discretion of the surgeon.    A copy of this note will be routed to requesting surgeon.  Time:   Today, I have spent 10 minutes with the patient with telehealth technology discussing medical history, symptoms, and management plan.     Lum CROME Louis, NP  09/24/2023, 3:14 PM

## 2023-09-24 NOTE — Telephone Encounter (Signed)
 Pt has been scheduled add on tele preop appt due to procedure date and med hold. I asked pt if she had been holding ASA. She said yes as of last Thursday 09/19/23. Pt tells me that her surgery is due to she fell and hit her face. She states she is healing from a concussion from the fall as well.   Med rec and consent are done.     Patient Consent for Virtual Visit        Cindy Hester has provided verbal consent on 09/24/2023 for a virtual visit (video or telephone).   CONSENT FOR VIRTUAL VISIT FOR:  Cindy Hester  By participating in this virtual visit I agree to the following:  I hereby voluntarily request, consent and authorize [facility] and its employed or contracted physicians, physician assistants, nurse practitioners or other licensed health care professionals (the Practitioner), to provide me with telemedicine health care services (the "Services) as deemed necessary by the treating Practitioner. I acknowledge and consent to receive the Services by the Practitioner via telemedicine. I understand that the telemedicine visit will involve communicating with the Practitioner through live audiovisual communication technology and the disclosure of certain medical information by electronic transmission. I acknowledge that I have been given the opportunity to request an in-person assessment or other available alternative prior to the telemedicine visit and am voluntarily participating in the telemedicine visit.  I understand that I have the right to withhold or withdraw my consent to the use of telemedicine in the course of my care at any time, without affecting my right to future care or treatment, and that the Practitioner or I may terminate the telemedicine visit at any time. I understand that I have the right to inspect all information obtained and/or recorded in the course of the telemedicine visit and may receive copies of available information for a reasonable fee.  I understand  that some of the potential risks of receiving the Services via telemedicine include:  Delay or interruption in medical evaluation due to technological equipment failure or disruption; Information transmitted may not be sufficient (e.g. poor resolution of images) to allow for appropriate medical decision making by the Practitioner; and/or  In rare instances, security protocols could fail, causing a breach of personal health information.  Furthermore, I acknowledge that it is my responsibility to provide information about my medical history, conditions and care that is complete and accurate to the best of my ability. I acknowledge that Practitioner's advice, recommendations, and/or decision may be based on factors not within their control, such as incomplete or inaccurate data provided by me or distortions of diagnostic images or specimens that may result from electronic transmissions. I understand that the practice of medicine is not an exact science and that Practitioner makes no warranties or guarantees regarding treatment outcomes. I acknowledge that a copy of this consent can be made available to me via my patient portal [facility] MyChart), or I can request a printed copy by calling the office of [facility].    I understand that my insurance will be billed for this visit.   I have read or had this consent read to me. I understand the contents of this consent, which adequately explains the benefits and risks of the Services being provided via telemedicine.  I have been provided ample opportunity to ask questions regarding this consent and the Services and have had my questions answered to my satisfaction. I give my informed consent for the services  to be provided through the use of telemedicine in my medical care

## 2023-09-26 ENCOUNTER — Ambulatory Visit: Admitting: Family Medicine

## 2023-09-26 ENCOUNTER — Other Ambulatory Visit: Payer: Self-pay

## 2023-09-26 ENCOUNTER — Ambulatory Visit
Admission: RE | Admit: 2023-09-26 | Discharge: 2023-09-26 | Disposition: A | Discharge status: Home / Self Care | Attending: Otolaryngology | Admitting: Otolaryngology

## 2023-09-26 ENCOUNTER — Ambulatory Visit: Payer: Self-pay | Admitting: Anesthesiology

## 2023-09-26 ENCOUNTER — Encounter
Admission: RE | Disposition: A | Discharge status: Home / Self Care | Payer: Self-pay | Source: Home / Self Care | Attending: Otolaryngology

## 2023-09-26 ENCOUNTER — Encounter: Payer: Self-pay | Admitting: Otolaryngology

## 2023-09-26 DIAGNOSIS — Z79899 Other long term (current) drug therapy: Secondary | ICD-10-CM | POA: Insufficient documentation

## 2023-09-26 DIAGNOSIS — I10 Essential (primary) hypertension: Secondary | ICD-10-CM | POA: Insufficient documentation

## 2023-09-26 DIAGNOSIS — K219 Gastro-esophageal reflux disease without esophagitis: Secondary | ICD-10-CM | POA: Diagnosis not present

## 2023-09-26 DIAGNOSIS — G4730 Sleep apnea, unspecified: Secondary | ICD-10-CM | POA: Diagnosis not present

## 2023-09-26 DIAGNOSIS — X58XXXA Exposure to other specified factors, initial encounter: Secondary | ICD-10-CM | POA: Insufficient documentation

## 2023-09-26 DIAGNOSIS — E119 Type 2 diabetes mellitus without complications: Secondary | ICD-10-CM | POA: Diagnosis not present

## 2023-09-26 DIAGNOSIS — J3489 Other specified disorders of nose and nasal sinuses: Secondary | ICD-10-CM | POA: Diagnosis not present

## 2023-09-26 DIAGNOSIS — S022XXA Fracture of nasal bones, initial encounter for closed fracture: Secondary | ICD-10-CM | POA: Diagnosis not present

## 2023-09-26 DIAGNOSIS — E785 Hyperlipidemia, unspecified: Secondary | ICD-10-CM | POA: Diagnosis not present

## 2023-09-26 DIAGNOSIS — J342 Deviated nasal septum: Secondary | ICD-10-CM | POA: Diagnosis not present

## 2023-09-26 DIAGNOSIS — E039 Hypothyroidism, unspecified: Secondary | ICD-10-CM | POA: Insufficient documentation

## 2023-09-26 HISTORY — DX: Type 2 diabetes mellitus with diabetic polyneuropathy: E11.42

## 2023-09-26 HISTORY — PX: ORIF FACIAL FRACTURE: SHX2118

## 2023-09-26 HISTORY — DX: Polyneuropathy, unspecified: G62.9

## 2023-09-26 HISTORY — DX: Allergic rhinitis, unspecified: J30.9

## 2023-09-26 HISTORY — DX: Other ill-defined heart diseases: I51.89

## 2023-09-26 LAB — GLUCOSE, CAPILLARY
Glucose-Capillary: 154 mg/dL — ABNORMAL HIGH (ref 70–99)
Glucose-Capillary: 131 mg/dL — ABNORMAL HIGH (ref 70–99)

## 2023-09-26 LAB — COMPREHENSIVE METABOLIC PANEL WITH GFR
ALT: 16 U/L (ref 0–44)
AST: 17 U/L (ref 15–41)
Albumin: 3.6 g/dL (ref 3.5–5.0)
Alkaline Phosphatase: 48 U/L (ref 38–126)
Anion gap: 10 (ref 5–15)
BUN: 20 mg/dL (ref 8–23)
CO2: 24 mmol/L (ref 22–32)
Calcium: 9.2 mg/dL (ref 8.9–10.3)
Chloride: 109 mmol/L (ref 98–111)
Creatinine, Ser: 0.71 mg/dL (ref 0.44–1.00)
GFR, Estimated: >60 mL/min (ref >60)
Glucose, Bld: 127 mg/dL — ABNORMAL HIGH (ref 70–99)
Potassium: 4.6 mmol/L (ref 3.5–5.1)
Sodium: 143 mmol/L (ref 135–145)
Total Bilirubin: 0.5 mg/dL (ref 0.0–1.2)
Total Protein: 6.4 g/dL — ABNORMAL LOW (ref 6.5–8.1)

## 2023-09-26 LAB — CBC
HCT: 38.4 % (ref 36.0–46.0)
Hemoglobin: 12.8 g/dL (ref 12.0–15.0)
MCH: 28.7 pg (ref 26.0–34.0)
MCHC: 33.3 g/dL (ref 30.0–36.0)
MCV: 86.1 fL (ref 80.0–100.0)
Platelets: 263 K/uL (ref 150–400)
RBC: 4.46 MIL/uL (ref 3.87–5.11)
RDW: 12.9 % (ref 11.5–15.5)
WBC: 5.3 K/uL (ref 4.0–10.5)
nRBC: 0 % (ref 0.0–0.2)

## 2023-09-26 SURGERY — OPEN REDUCTION INTERNAL FIXATION (ORIF) FACIAL FRACTURE
Anesthesia: General | Site: Nose | Laterality: Bilateral

## 2023-09-26 MED ORDER — LIDOCAINE HCL (PF) 4 % IJ SOLN
INTRAMUSCULAR | Status: AC
  Filled 2023-09-26: qty 5

## 2023-09-26 MED ORDER — PREDNISONE 10 MG PO TABS: ORAL_TABLET | ORAL | 0 refills | Status: DC

## 2023-09-26 MED ORDER — FENTANYL CITRATE (PF) 100 MCG/2ML IJ SOLN
25.0000 ug | INTRAMUSCULAR | Status: DC | PRN
  Administered 2023-09-26 (×3): 50 ug via INTRAVENOUS

## 2023-09-26 MED ORDER — OXYCODONE HCL 5 MG PO TABS
10.0000 mg | ORAL_TABLET | Freq: Once | ORAL | Status: AC
  Administered 2023-09-26: 10 mg via ORAL

## 2023-09-26 MED ORDER — LIDOCAINE HCL (CARDIAC) PF 100 MG/5ML IV SOSY
PREFILLED_SYRINGE | INTRAVENOUS | Status: DC | PRN
  Administered 2023-09-26: 60 mg via INTRAVENOUS

## 2023-09-26 MED ORDER — BACITRACIN ZINC 500 UNIT/GM EX OINT
TOPICAL_OINTMENT | CUTANEOUS | Status: AC
  Filled 2023-09-26: qty 28.35

## 2023-09-26 MED ORDER — DEXAMETHASONE SODIUM PHOSPHATE 10 MG/ML IJ SOLN
INTRAMUSCULAR | Status: DC | PRN
  Administered 2023-09-26: 10 mg via INTRAVENOUS

## 2023-09-26 MED ORDER — LIDOCAINE-EPINEPHRINE 1 %-1:100000 IJ SOLN
INTRAMUSCULAR | Status: AC
  Filled 2023-09-26: qty 1

## 2023-09-26 MED ORDER — PROPOFOL 1000 MG/100ML IV EMUL
INTRAVENOUS | Status: AC
  Filled 2023-09-26: qty 100

## 2023-09-26 MED ORDER — KETAMINE HCL 50 MG/5ML IJ SOSY
PREFILLED_SYRINGE | INTRAMUSCULAR | Status: AC
  Filled 2023-09-26: qty 5

## 2023-09-26 MED ORDER — SUGAMMADEX SODIUM 200 MG/2ML IV SOLN
INTRAVENOUS | Status: DC | PRN
  Administered 2023-09-26 (×2): 200 mg via INTRAVENOUS

## 2023-09-26 MED ORDER — PROPOFOL 10 MG/ML IV BOLUS
INTRAVENOUS | Status: AC
  Filled 2023-09-26: qty 20

## 2023-09-26 MED ORDER — 0.9 % SODIUM CHLORIDE (POUR BTL) OPTIME
TOPICAL | Status: DC | PRN
  Administered 2023-09-26: 500 mL

## 2023-09-26 MED ORDER — LIDOCAINE-EPINEPHRINE (PF) 1 %-1:200000 IJ SOLN
INTRAMUSCULAR | Status: DC | PRN
  Administered 2023-09-26: 10 mL

## 2023-09-26 MED ORDER — OXYMETAZOLINE HCL 0.05 % NA SOLN: 2.0000 | Freq: Two times a day (BID) | NASAL | Status: DC

## 2023-09-26 MED ORDER — PHENYLEPHRINE 80 MCG/ML (10ML) SYRINGE FOR IV PUSH (FOR BLOOD PRESSURE SUPPORT)
PREFILLED_SYRINGE | INTRAVENOUS | Status: DC | PRN
  Administered 2023-09-26 (×2): 80 ug via INTRAVENOUS

## 2023-09-26 MED ORDER — FENTANYL CITRATE (PF) 100 MCG/2ML IJ SOLN
INTRAMUSCULAR | Status: AC
Start: 2023-09-26 — End: 2023-09-26
  Filled 2023-09-26: qty 2

## 2023-09-26 MED ORDER — CEFAZOLIN SODIUM-DEXTROSE 2-3 GM-%(50ML) IV SOLR
INTRAVENOUS | Status: DC | PRN
  Administered 2023-09-26: 2 g via INTRAVENOUS

## 2023-09-26 MED ORDER — ACETAMINOPHEN 10 MG/ML IV SOLN
INTRAVENOUS | Status: AC
  Filled 2023-09-26: qty 100

## 2023-09-26 MED ORDER — OXYCODONE HCL 5 MG PO TABS
ORAL_TABLET | ORAL | Status: AC
  Filled 2023-09-26: qty 2

## 2023-09-26 MED ORDER — LIDOCAINE HCL (PF) 2 % IJ SOLN
INTRAMUSCULAR | Status: AC
  Filled 2023-09-26: qty 5

## 2023-09-26 MED ORDER — SODIUM CHLORIDE 0.9 % IV SOLN: INTRAVENOUS | Status: DC

## 2023-09-26 MED ORDER — DEXMEDETOMIDINE HCL IN NACL 80 MCG/20ML IV SOLN
INTRAVENOUS | Status: DC | PRN
  Administered 2023-09-26: 4 ug via INTRAVENOUS

## 2023-09-26 MED ORDER — LIDOCAINE HCL (PF) 4 % IJ SOLN
INTRAMUSCULAR | Status: DC | PRN
  Administered 2023-09-26: 5 mL

## 2023-09-26 MED ORDER — PROPOFOL 10 MG/ML IV BOLUS
INTRAVENOUS | Status: DC | PRN
  Administered 2023-09-26: 110 ug/kg/min via INTRAVENOUS
  Administered 2023-09-26: 140 mg via INTRAVENOUS

## 2023-09-26 MED ORDER — LACTATED RINGERS IV SOLN: INTRAVENOUS | Status: DC

## 2023-09-26 MED ORDER — FENTANYL CITRATE (PF) 100 MCG/2ML IJ SOLN
INTRAMUSCULAR | Status: AC
  Filled 2023-09-26: qty 2

## 2023-09-26 MED ORDER — KETAMINE HCL 50 MG/5ML IJ SOSY
PREFILLED_SYRINGE | INTRAMUSCULAR | Status: DC | PRN
  Administered 2023-09-26 (×2): 10 mg via INTRAVENOUS

## 2023-09-26 MED ORDER — GLYCOPYRROLATE 0.2 MG/ML IJ SOLN
INTRAMUSCULAR | Status: AC
  Filled 2023-09-26: qty 1

## 2023-09-26 MED ORDER — ACETAMINOPHEN 10 MG/ML IV SOLN
INTRAVENOUS | Status: DC | PRN
  Administered 2023-09-26: 1000 mg via INTRAVENOUS

## 2023-09-26 MED ORDER — OXYMETAZOLINE HCL 0.05 % NA SOLN
NASAL | Status: AC
  Filled 2023-09-26: qty 30

## 2023-09-26 MED ORDER — CEPHALEXIN 500 MG PO CAPS: 500.0000 mg | ORAL_CAPSULE | Freq: Two times a day (BID) | ORAL | 0 refills | Status: AC

## 2023-09-26 MED ORDER — PHENYLEPHRINE 80 MCG/ML (10ML) SYRINGE FOR IV PUSH (FOR BLOOD PRESSURE SUPPORT)
PREFILLED_SYRINGE | INTRAVENOUS | Status: AC
Start: 2023-09-26 — End: 2023-09-26
  Filled 2023-09-26: qty 10

## 2023-09-26 MED ORDER — ROCURONIUM BROMIDE 100 MG/10ML IV SOLN
INTRAVENOUS | Status: DC | PRN
  Administered 2023-09-26: 40 mg via INTRAVENOUS

## 2023-09-26 MED ORDER — ONDANSETRON HCL 4 MG/2ML IJ SOLN
INTRAMUSCULAR | Status: DC | PRN
  Administered 2023-09-26: 4 mg via INTRAVENOUS

## 2023-09-26 MED ORDER — DROPERIDOL 2.5 MG/ML IJ SOLN: 0.6250 mg | Freq: Once | INTRAMUSCULAR | Status: DC | PRN

## 2023-09-26 MED ORDER — FENTANYL CITRATE (PF) 100 MCG/2ML IJ SOLN
INTRAMUSCULAR | Status: DC | PRN
  Administered 2023-09-26: 25 ug via INTRAVENOUS
  Administered 2023-09-26: 50 ug via INTRAVENOUS
  Administered 2023-09-26: 25 ug via INTRAVENOUS

## 2023-09-26 MED ORDER — OXYMETAZOLINE HCL 0.05 % NA SOLN
NASAL | Status: DC | PRN
  Administered 2023-09-26: 1 via TOPICAL

## 2023-09-26 MED ORDER — GLYCOPYRROLATE 0.2 MG/ML IJ SOLN
INTRAMUSCULAR | Status: DC | PRN
  Administered 2023-09-26: .2 mg via INTRAVENOUS

## 2023-09-26 SURGICAL SUPPLY — 24 items
BLADE SURG 15 STRL LF DISP TIS (BLADE) ×1 IMPLANT
CORD BIP STRL DISP 12FT (MISCELLANEOUS) ×1 IMPLANT
DRSG TELFA 3X8 NADH STRL (GAUZE/BANDAGES/DRESSINGS) ×1 IMPLANT
ELECTRODE REM PT RTRN 9FT ADLT (ELECTROSURGICAL) ×1 IMPLANT
FORCEPS JEWEL BIP 4-3/4 STR (INSTRUMENTS) ×1 IMPLANT
GAUZE 4X4 16PLY ~~LOC~~+RFID DBL (SPONGE) ×1 IMPLANT
GAUZE SPONGE 4X4 12PLY STRL (GAUZE/BANDAGES/DRESSINGS) ×1 IMPLANT
GLOVE SURG LATEX 7.5 PF (GLOVE) ×1 IMPLANT
GOWN STRL REUS W/ TWL LRG LVL3 (GOWN DISPOSABLE) ×2 IMPLANT
HANDLE YANKAUER SUCT BULB TIP (MISCELLANEOUS) IMPLANT
LABEL OR SOLS (LABEL) ×1 IMPLANT
MANIFOLD NEPTUNE II (INSTRUMENTS) ×1 IMPLANT
NDL MAYO 6 CRC TAPER PT (NEEDLE) IMPLANT
NEEDLE MAYO 6 CRC TAPER PT (NEEDLE) ×1 IMPLANT
NS IRRIG 500ML POUR BTL (IV SOLUTION) ×1 IMPLANT
PACK HEAD/NECK (MISCELLANEOUS) ×1 IMPLANT
PAD MAGNETIC INSTR ST 16X20 (MISCELLANEOUS) ×1 IMPLANT
PATTIES SURGICAL .5 X3 (DISPOSABLE) IMPLANT
SPLINT NASAL REUTER .5MM (MISCELLANEOUS) IMPLANT
STRIP CLOSURE SKIN 1/2X4 (GAUZE/BANDAGES/DRESSINGS) IMPLANT
STRIP CLOSURE SKIN 1/4X4 (GAUZE/BANDAGES/DRESSINGS) IMPLANT
SUCTION TUBE FRAZIER 10FR DISP (SUCTIONS) ×1 IMPLANT
SUT ETHILON 3-0 KS 30 BLK (SUTURE) IMPLANT
SUTURE CHRMC 3-0 KS 27XMFL CR (SUTURE) IMPLANT

## 2023-09-26 NOTE — H&P (Signed)
H&P has been reviewed and patient reevaluated, no changes necessary. To be downloaded later.  

## 2023-09-26 NOTE — Anesthesia Procedure Notes (Signed)
 Procedure Name: Intubation Date/Time: 09/26/2023 11:30 AM  Performed by: Gillermo Spruce I, CRNAPre-anesthesia Checklist: Patient identified, Emergency Drugs available, Suction available and Patient being monitored Patient Re-evaluated:Patient Re-evaluated prior to induction Oxygen Delivery Method: Circle system utilized Preoxygenation: Pre-oxygenation with 100% oxygen Induction Type: IV induction Ventilation: Mask ventilation without difficulty Laryngoscope Size: McGrath and 3 Grade View: Grade I Tube type: Oral Tube size: 7.0 mm Number of attempts: 1 Airway Equipment and Method: Stylet and Oral airway Placement Confirmation: ETT inserted through vocal cords under direct vision, positive ETCO2 and breath sounds checked- equal and bilateral Secured at: 21 cm Tube secured with: Tape Dental Injury: Teeth and Oropharynx as per pre-operative assessment

## 2023-09-26 NOTE — Op Note (Signed)
 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  Open reduction/septoplasty of fractured and displaced nasal septum  Surg:  [facility] DEL Solina Heron  Anes:  GOT  EBL: 40 mL  Comp: None  Findings: The dorsal nasal bones were collapsed inwards and were solid and fixed in position because the fracture was 3 weeks ago.  This required opening up to get to the nasal bones to loosen them up to adjust their position.  The septum was fractured and displaced to the right side and completely obstructing the nasal airway.  This gave loss of support to the nasal dorsum and a saddlenose deformity.  Procedure: The patient was brought to the operating room and placed in the supine position.  She was given general anesthesia by oral endotracheal intubation.  Once the patient was asleep her nose was prepped using 10 mL of 1% Xylocaine  with epi 1: 100,000 for infiltration into the nasal septum and nasal dorsum.  The nose then had cotton pledgets placed in it that had been soaked in Afrin and 4% lidocaine .  These were allowed to sit in the nose while the patient was prepped and draped in a sterile fashion.  The cotton pledgets were removed and the nasal passageways were visualized.  The septum could be felt buckled off into the right nasal passageway.  The inferior border of it had no pushed out and was filling the lower portion of the left nostril.  The nasal bones were collapsed in the middle and there was a loss of support of the dorsal cartilage as well.  A left hemitransfixion was created with elevation of mucoperichondrium over the left side of the quadrangular plate.  You could see what was fractured inferiorly and was pushed out in the left lower nasal airway.  About 2 cm back in the septum was fractured and more of a vertical  fashion where it was buckled off to the right side and blocking the right nasal airway.  The inferior portion of the most caudal septum was trimmed slightly so I could pull it back to the midline and get it to sit on the nasal spine properly.  The midportion of the septum was pushed back over to the midline from the right side and some of the rough edges of inferior cartilage that were fractured were removed to allow it to sit straighter.  Superiorly a tunnel was created to expose the dorsal nasal bones fractures.  This was fairly firm and depressed and had to be loosened up anteriorly to allow the bony midline and ethmoid plate to move.  The ethmoid plate was pushed over from the right side to midline and with the loosened dorsal nasal bones coming with it.  This straightened out the midline portion of the nose some.  The bones were elevated back into position.  The mucosa then was laid back on the cartilage to view the nostrils on both sides.  The caudal septum was now straight with the cartilage at the anterior nasal spine.  This was sutured through and through with a 3-0 chromic suture to hold it in the midline and hold the mucosa against it.  Further sutures were carried out inferiorly to hold it in the midline as well.  A 4-0 plain gut suture was then used to close some  of the hemitransfixion incision but now was used to hold the mucosa against the cartilage superiorly such that the ethmoid plate would be held in the midline and the nasal bones could not collapse downward again and the subtle deformity as before.  This reconstructed the midportion of the nasal septum and dorsum so it appeared to be straight.  The airways were more open on both sides now.  Oversized router splints were then trimmed to fit the side of the septum on both sides and push up into the superior groove on both sides to hold the nasal dorsum in position.  Once these were trimmed and put in position on both sides they were sutured  through and through with a 3-0 nylon suture.  The nasal dorsum appeared to be straight and smooth across its surface.  There was not a collapse in the midline of the dorsum as before.  Alcohol was used to wipe the skin over the nasal dorsum and then Steri-Strips were applied.  Once these were on an Aquaplast cast was trimmed and placed over the Steri-Strips to help hold this solid.  Some tape was then placed over the Aquaplast to help hold this into position.  The patient tolerated the procedure well.  There were no operative complications.  The nasal airway was now open on both sides all the way the back.  There was no packing in the nose.  She was awakened and taken to the recovery room in satisfactory condition.  Dispo: To PACU to be discharged home.  Plan: To follow-up in the office in 1 week for removal of the nasal splints and the cast.  I have written her for some Keflex  to use preventatively.  She will be on a short prednisone  taper from 30 mg to 0 over 6 days.  I have written for Tylenol  with hydrocodone that she can use for pain if needed but otherwise can use just plain Tylenol .  She can start saline flushes tomorrow to help keep her nose clean.  She can sniff all she wants but should not blow her nose.  She will rest at home with her head elevated.  She will not use CPAP for the first couple days but then can consider starting it after that.  [facility] DEL Linsey Arteaga  09/26/2023 6:04 PM

## 2023-09-26 NOTE — Transfer of Care (Signed)
 Immediate Anesthesia Transfer of Care Note  Patient: Cindy Hester  Procedure(s) Performed: OPEN REDUCTION NASAL AND SEPTAL FRACTURE (Bilateral: Nose)  Patient Location: PACU  Anesthesia Type:General  Level of Consciousness: awake, alert , and oriented  Airway & Oxygen Therapy: Patient Spontanous Breathing and Patient connected to face mask oxygen  Post-op Assessment: Report given to RN and Post -op Vital signs reviewed and stable  Post vital signs: stable  Last Vitals:  Vitals Value Taken Time  BP 130/58 09/26/23 13:00  Temp    Pulse 75 09/26/23 13:03  Resp 22 09/26/23 13:03  SpO2 100 % 09/26/23 13:03  Vitals shown include unfiled device data.  Last Pain:  Vitals:   09/26/23 0910  TempSrc: Temporal  PainSc: 0-No pain         Complications: No notable events documented.

## 2023-09-27 ENCOUNTER — Encounter: Payer: Self-pay | Admitting: Otolaryngology

## 2023-10-03 DIAGNOSIS — J3489 Other specified disorders of nose and nasal sinuses: Secondary | ICD-10-CM | POA: Diagnosis not present

## 2023-10-03 DIAGNOSIS — R202 Paresthesia of skin: Secondary | ICD-10-CM | POA: Diagnosis not present

## 2023-10-03 DIAGNOSIS — R2689 Other abnormalities of gait and mobility: Secondary | ICD-10-CM | POA: Diagnosis not present

## 2023-10-03 DIAGNOSIS — S060X0D Concussion without loss of consciousness, subsequent encounter: Secondary | ICD-10-CM | POA: Diagnosis not present

## 2023-10-03 DIAGNOSIS — R200 Anesthesia of skin: Secondary | ICD-10-CM | POA: Diagnosis not present

## 2023-10-04 ENCOUNTER — Other Ambulatory Visit: Payer: Self-pay | Admitting: Student

## 2023-10-04 DIAGNOSIS — Z87828 Personal history of other (healed) physical injury and trauma: Secondary | ICD-10-CM

## 2023-10-06 NOTE — Anesthesia Postprocedure Evaluation (Signed)
 Anesthesia Post Note  Patient: Cindy Hester  Procedure(s) Performed: OPEN REDUCTION NASAL AND SEPTAL FRACTURE (Bilateral: Nose)  Patient location during evaluation: PACU Anesthesia Type: General Level of consciousness: awake and alert Pain management: pain level controlled Vital Signs Assessment: post-procedure vital signs reviewed and stable Respiratory status: spontaneous breathing, nonlabored ventilation, respiratory function stable and patient connected to nasal cannula oxygen Cardiovascular status: blood pressure returned to baseline and stable Postop Assessment: no apparent nausea or vomiting Anesthetic complications: no   No notable events documented.   Last Vitals:  Vitals:   09/26/23 1400 09/26/23 1420  BP: 135/64 (!) 153/59  Pulse: 73 73  Resp: 18 20  Temp: (!) 36.4 C (!) 36.3 C  SpO2: 100% 99%    Last Pain:  Vitals:   09/26/23 1420  TempSrc: Tympanic  PainSc:                  Prentice Murphy

## 2023-10-08 ENCOUNTER — Encounter: Payer: Self-pay | Admitting: Student

## 2023-10-09 ENCOUNTER — Ambulatory Visit
Admission: RE | Admit: 2023-10-09 | Discharge: 2023-10-09 | Disposition: A | Discharge status: Home / Self Care | Source: Ambulatory Visit | Attending: Student | Admitting: Student

## 2023-10-09 ENCOUNTER — Ambulatory Visit: Admitting: Dermatology

## 2023-10-09 DIAGNOSIS — Z87828 Personal history of other (healed) physical injury and trauma: Secondary | ICD-10-CM

## 2023-10-09 DIAGNOSIS — R410 Disorientation, unspecified: Secondary | ICD-10-CM | POA: Diagnosis not present

## 2023-10-10 DIAGNOSIS — J3489 Other specified disorders of nose and nasal sinuses: Secondary | ICD-10-CM | POA: Diagnosis not present

## 2023-10-10 DIAGNOSIS — J3081 Allergic rhinitis due to animal (cat) (dog) hair and dander: Secondary | ICD-10-CM | POA: Diagnosis not present

## 2023-10-10 DIAGNOSIS — J3089 Other allergic rhinitis: Secondary | ICD-10-CM | POA: Diagnosis not present

## 2023-10-10 DIAGNOSIS — J301 Allergic rhinitis due to pollen: Secondary | ICD-10-CM | POA: Diagnosis not present

## 2023-10-15 ENCOUNTER — Ambulatory Visit: Admitting: Family Medicine

## 2023-10-22 ENCOUNTER — Ambulatory Visit: Admitting: Dermatology

## 2023-10-22 DIAGNOSIS — Z79899 Other long term (current) drug therapy: Secondary | ICD-10-CM

## 2023-10-22 DIAGNOSIS — L650 Telogen effluvium: Secondary | ICD-10-CM | POA: Diagnosis not present

## 2023-10-22 DIAGNOSIS — Z85828 Personal history of other malignant neoplasm of skin: Secondary | ICD-10-CM | POA: Diagnosis not present

## 2023-10-22 DIAGNOSIS — L649 Androgenic alopecia, unspecified: Secondary | ICD-10-CM | POA: Diagnosis not present

## 2023-10-22 NOTE — Progress Notes (Signed)
 Follow-Up Visit   Subjective  Cindy Hester is a 78 y.o. female who presents for the following: Androgenetic alopecia 3 month follow-up, taking finasteride  5 MG daily and minoxidil  2.5 MG daily. Patient not sure if she has noticed improvement yet. No side effects that she has noticed. Patient had a fall last month with concussion, followed by surgery on her broken nose.    The following portions of the chart were reviewed this encounter and updated as appropriate: medications, allergies, medical history  Review of Systems:  No other skin or systemic complaints except as noted in HPI or Assessment and Plan.  Objective  Well appearing patient in no apparent distress; mood and affect are within normal limits.  A focused examination was performed of the following areas: Scalp   Relevant physical exam findings are noted in the Assessment and Plan.           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 New photos today  Chronic and persistent condition with duration or expected duration over one year. Condition is symptomatic/ bothersome to patient. Not currently at goal.   Female Androgenic Alopecia is a chronic condition related to genetics and/or hormonal changes.  In women androgenetic alopecia is commonly associated with menopause but may occur any time after puberty.  It causes hair thinning primarily on the crown with widening of the part and temporal hairline recession.  Can use OTC Rogaine  (minoxidil ) 5% solution/foam as directed.  Oral treatments in female patients who have no contraindication may include : - Low dose oral minoxidil  1.25 - 5mg  daily - Spironolactone  50 - 100mg  bid - Finasteride  2.5 - 5 mg daily Adjunctive therapies  include: - Low Level Laser Light Therapy (LLLT) - Platelet-rich plasma injections (PRP) - Hair Transplants or scalp reduction   Treatment Plan: BP 144/65 Continue minoxidil  2.5 mg, take 1 full tablet once daily.  Dsp #90 (3 mo supply) 1 rf   Doses of oral minoxidil  for hair loss are considered 'low dose'. This is because the doses used for hair loss are much lower than the doses which are used for conditions such as high blood pressure (hypertension). The doses used for hypertension are 10-40mg  per day.  Side effects are uncommon at the low doses (up to 2.5 mg/day) used to treat hair loss. Potential side effects, more commonly seen at higher doses, include: Increase in hair growth (hypertrichosis) elsewhere on face and body Temporary hair shedding upon starting medication which may last up to 4 weeks Ankle swelling, fluid retention, rapid weight gain more than 5 pounds Low blood pressure and feeling lightheaded or dizzy when standing up quickly Fast or irregular heartbeat Headaches   Continue finasteride  5 mg take 1 full tablet once daily. Dsp #90 (3 mo supply) 1Rf  Counseled that finasteride  can decrease libido (sexual drive). Advised it should not be taken by pregnant women or women who could become pregnant. Advised not to donate blood products while taking this medication. Advised if medication is stopped, they may lose the hair the medication has been helping to grow.  Long term medication management.  Patient is using long term (months to years) prescription medication  to control their dermatologic condition.  These medications require periodic monitoring to evaluate for efficacy and side  effects and may require periodic laboratory monitoring.     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  - Call if any new or changing lesions are noted between office visits   Return as scheduled, for Botox with Dr MARLA, filler with Dr Jackquline.SABRA LILLETTE [name], CMA, am acting as scribe for Rexene Jackquline, MD .   Documentation: I have reviewed the above documentation for accuracy and completeness, and I agree with the above.  Rexene Jackquline, MD

## 2023-10-22 NOTE — Patient Instructions (Addendum)
 Continue finasteride  5 mg take 1 full tablet once daily.  Counseled that finasteride  can decrease libido (sexual drive). Advised it should not be taken by pregnant women or women who could become pregnant. Advised not to donate blood products while taking this medication. Advised if medication is stopped, they may lose the hair the medication has been helping to grow.  Continue minoxidil  2.5 mg, take 1 full tablet once daily.  Doses of oral minoxidil  for hair loss are considered 'low dose'. This is because the doses used for hair loss are much lower than the doses which are used for conditions such as high blood pressure (hypertension). The doses used for hypertension are 10-40mg  per day.  Side effects are uncommon at the low doses (up to 2.5 mg/day) used to treat hair loss. Potential side effects, more commonly seen at higher doses, include: Increase in hair growth (hypertrichosis) elsewhere on face and body Temporary hair shedding upon starting medication which may last up to 4 weeks Ankle swelling, fluid retention, rapid weight gain more than 5 pounds Low blood pressure and feeling lightheaded or dizzy when standing up quickly Fast or irregular heartbeat Headaches    Due to recent changes in healthcare laws, you may see results of your pathology and/or laboratory studies on MyChart before the doctors have had a chance to review them. We understand that in some cases there may be results that are confusing or concerning to you. Please understand that not all results are received at the same time and often the doctors may need to interpret multiple results in order to provide you with the best plan of care or course of treatment. Therefore, we ask that you please give us  2 business days to thoroughly review all your results before contacting the office for clarification. Should we see a critical lab result, you will be contacted sooner.   If You Need Anything After Your Visit  If you have any  questions or concerns for your doctor, please call our main line at [phone] and press option 4 to reach your doctor's medical assistant. If no one answers, please leave a voicemail as directed and we will return your call as soon as possible. Messages left after 4 pm will be answered the following business day.   You may also send us  a message via MyChart. We typically respond to MyChart messages within 1-2 business days.  For prescription refills, please ask your pharmacy to contact our office. Our fax number is [phone].  If you have an urgent issue when the clinic is closed that cannot wait until the next business day, you can page your doctor at the number below.    Please note that while we do our best to be available for urgent issues outside of office hours, we are not available 24/7.   If you have an urgent issue and are unable to reach us , you may choose to seek medical care at your doctor's office, retail clinic, urgent care center, or emergency room.  If you have a medical emergency, please immediately call 911 or go to the emergency department.  Pager Numbers  - Dr. Hester: [phone]  - Dr. Jackquline: [phone]  - Dr. Claudene: [phone]   - Dr. Raymund: [phone]  In the event of inclement weather, please call our main line at [phone] for an update on the status of any delays or closures.  Dermatology Medication Tips: Please keep the boxes that topical medications come in in order to help keep track  of the instructions about where and how to use these. Pharmacies typically print the medication instructions only on the boxes and not directly on the medication tubes.   If your medication is too expensive, please contact our office at [phone] option 4 or send us  a message through MyChart.   We are unable to tell what your co-pay for medications will be in advance as this is different depending on your insurance coverage. However, we may be able to  find a substitute medication at lower cost or fill out paperwork to get insurance to cover a needed medication.   If a prior authorization is required to get your medication covered by your insurance company, please allow us  1-2 business days to complete this process.  Drug prices often vary depending on where the prescription is filled and some pharmacies may offer cheaper prices.  The website www.goodrx.com contains coupons for medications through different pharmacies. The prices here do not account for what the cost may be with help from insurance (it may be cheaper with your insurance), but the website can give you the price if you did not use any insurance.  - You can print the associated coupon and take it with your prescription to the pharmacy.  - You may also stop by our office during regular business hours and pick up a GoodRx coupon card.  - If you need your prescription sent electronically to a different pharmacy, notify our office through [facility] or by phone at [phone] option 4.     Si Usted Necesita Algo Despus de Su Visita  Tambin puede enviarnos un mensaje a travs de Clinical cytogeneticist. Por lo general respondemos a los mensajes de MyChart en el transcurso de 1 a 2 das hbiles.  Para renovar recetas, por favor pida a su farmacia que se ponga en contacto con nuestra oficina. Randi lakes de fax es [place] [phone].  Si tiene un asunto urgente cuando la clnica est cerrada y que no puede esperar hasta el siguiente da hbil, puede llamar/localizar a su doctor(a) al nmero que aparece a continuacin.   Por favor, tenga en cuenta que aunque hacemos todo lo posible para estar disponibles para asuntos urgentes fuera del horario de [place], no estamos disponibles las 24 horas del da, los 7 [address]  [address] de la [place].   Si tiene un problema urgente y no puede comunicarse con nosotros, puede optar por buscar atencin mdica  en el consultorio de su doctor(a), en una clnica privada,  en un centro de atencin urgente o en una sala de emergencias.  Si tiene Engineer, drilling, por favor llame inmediatamente al 911 o vaya a la sala de emergencias.  Nmeros de bper  - Dr. Hester: [phone]  - Dra. Jackquline: [phone]  - Dr. Claudene: [phone]  - Dra. Kitts: [phone]  En caso de inclemencias del [place], por favor llame a nuestra lnea principal al [phone] para una actualizacin sobre el estado de cualquier retraso o cierre.  Consejos para la medicacin en dermatologa: Por favor, guarde las cajas en las que vienen los medicamentos de uso tpico para ayudarle a seguir las instrucciones sobre dnde y cmo usarlos. Las farmacias generalmente imprimen las instrucciones del medicamento slo en las cajas y no directamente en los tubos del [place].   Si su medicamento es muy caro, por favor, pngase en contacto con landry rieger llamando al [phone] y presione la opcin 4 o envenos un mensaje a travs de Clinical cytogeneticist.   No podemos decirle cul ser su  copago por los medicamentos por adelantado ya que esto es diferente dependiendo de la cobertura de su seguro. Sin embargo, es posible que podamos encontrar un medicamento sustituto a Audiological scientist un formulario para que el seguro cubra el medicamento que se considera necesario.   Si se requiere una autorizacin previa para que su compaa de seguros malta su medicamento, por favor permtanos de 1 a 2 das hbiles para completar este proceso.  Los precios de los medicamentos varan con frecuencia dependiendo del Environmental consultant de dnde se surte la receta y alguna farmacias pueden ofrecer precios ms baratos.  El sitio web www.goodrx.com tiene cupones para medicamentos de Health and safety inspector. Los precios aqu no tienen en cuenta lo que podra costar con la ayuda del seguro (puede ser ms barato con su seguro), pero el sitio web puede darle el precio si no utiliz Tourist information centre manager.  - Puede imprimir el cupn  correspondiente y llevarlo con su receta a la farmacia.  - Tambin puede pasar por nuestra oficina durante el horario de atencin regular y Education officer, museum una tarjeta de cupones de GoodRx.  - Si necesita que su receta se enve electrnicamente a una farmacia diferente, informe a nuestra oficina a travs de MyChart de [place] o por telfono llamando al [phone] y presione la opcin 4.

## 2023-10-30 ENCOUNTER — Other Ambulatory Visit: Payer: Self-pay | Admitting: Cardiology

## 2023-10-30 ENCOUNTER — Other Ambulatory Visit: Payer: Self-pay | Admitting: Dermatology

## 2023-10-30 ENCOUNTER — Ambulatory Visit (INDEPENDENT_AMBULATORY_CARE_PROVIDER_SITE_OTHER): Payer: Self-pay | Admitting: Dermatology

## 2023-10-30 ENCOUNTER — Encounter: Payer: Self-pay | Admitting: Dermatology

## 2023-10-30 DIAGNOSIS — L988 Other specified disorders of the skin and subcutaneous tissue: Secondary | ICD-10-CM

## 2023-10-30 NOTE — Progress Notes (Signed)
   Follow-Up Visit   Subjective  Cindy Hester is a 78 y.o. female who presents for the following: Botox for facial elastosis  The following portions of the chart were reviewed this encounter and updated as appropriate: medications, allergies, medical history  Review of Systems:  No other skin or systemic complaints except as noted in HPI or Assessment and Plan.  Objective  Well appearing patient in no apparent distress; mood and affect are within normal limits.  A focused examination was performed of the face.  Relevant physical exam findings are noted in the Assessment and Plan.      Assessment & Plan    Facial Elastosis  Location: See attached image  Informed consent: Discussed risks (infection, pain, bleeding, bruising, swelling, allergic reaction, paralysis of nearby muscles, eyelid droop, double vision, neck weakness, difficulty breathing, headache, undesirable cosmetic result, and need for additional treatment) and benefits of the procedure, as well as the alternatives.  Informed consent was obtained.  Preparation: The area was cleansed with alcohol.  Procedure Details:  Botox was injected into the dermis with a 30-gauge needle. Pressure applied to any bleeding. Ice packs offered for swelling.  Lot Number:  [id number] Expiration:  11/2025  Total Units Injected:  48  Plan: Tylenol  may be used for headache.  Allow 2 weeks before returning to clinic for additional dosing as needed. Patient will call for any problems.    Return for Botox in 3-4 months.  I, Jill Parcell, CMA, am acting as scribe for Alm Rhyme, MD.    Documentation: I have reviewed the above documentation for accuracy and completeness, and I agree with the above.  Alm Rhyme, MD

## 2023-10-30 NOTE — Patient Instructions (Signed)

## 2023-11-01 DIAGNOSIS — J3081 Allergic rhinitis due to animal (cat) (dog) hair and dander: Secondary | ICD-10-CM | POA: Diagnosis not present

## 2023-11-01 DIAGNOSIS — J301 Allergic rhinitis due to pollen: Secondary | ICD-10-CM | POA: Diagnosis not present

## 2023-11-01 DIAGNOSIS — J3089 Other allergic rhinitis: Secondary | ICD-10-CM | POA: Diagnosis not present

## 2023-11-07 DIAGNOSIS — J3089 Other allergic rhinitis: Secondary | ICD-10-CM | POA: Diagnosis not present

## 2023-11-07 DIAGNOSIS — J301 Allergic rhinitis due to pollen: Secondary | ICD-10-CM | POA: Diagnosis not present

## 2023-11-07 DIAGNOSIS — J3081 Allergic rhinitis due to animal (cat) (dog) hair and dander: Secondary | ICD-10-CM | POA: Diagnosis not present

## 2023-11-11 DIAGNOSIS — R200 Anesthesia of skin: Secondary | ICD-10-CM | POA: Diagnosis not present

## 2023-11-19 DIAGNOSIS — J3081 Allergic rhinitis due to animal (cat) (dog) hair and dander: Secondary | ICD-10-CM | POA: Diagnosis not present

## 2023-11-19 DIAGNOSIS — J3089 Other allergic rhinitis: Secondary | ICD-10-CM | POA: Diagnosis not present

## 2023-11-19 DIAGNOSIS — J301 Allergic rhinitis due to pollen: Secondary | ICD-10-CM | POA: Diagnosis not present

## 2023-11-22 ENCOUNTER — Ambulatory Visit: Admitting: Family Medicine

## 2023-11-25 DIAGNOSIS — H2513 Age-related nuclear cataract, bilateral: Secondary | ICD-10-CM | POA: Diagnosis not present

## 2023-11-25 DIAGNOSIS — H25013 Cortical age-related cataract, bilateral: Secondary | ICD-10-CM | POA: Diagnosis not present

## 2023-11-25 DIAGNOSIS — E119 Type 2 diabetes mellitus without complications: Secondary | ICD-10-CM | POA: Diagnosis not present

## 2023-11-25 DIAGNOSIS — H524 Presbyopia: Secondary | ICD-10-CM | POA: Diagnosis not present

## 2023-11-25 LAB — OPHTHALMOLOGY REPORT-SCANNED

## 2023-11-26 ENCOUNTER — Ambulatory Visit

## 2023-11-26 VITALS — BP 119/53 | HR 73 | Resp 16 | Ht 66.0 in | Wt 163.0 lb

## 2023-11-26 DIAGNOSIS — E785 Hyperlipidemia, unspecified: Secondary | ICD-10-CM

## 2023-11-26 DIAGNOSIS — R42 Dizziness and giddiness: Secondary | ICD-10-CM

## 2023-11-26 DIAGNOSIS — Z96642 Presence of left artificial hip joint: Secondary | ICD-10-CM | POA: Diagnosis not present

## 2023-11-26 DIAGNOSIS — Z23 Encounter for immunization: Secondary | ICD-10-CM | POA: Diagnosis not present

## 2023-11-26 DIAGNOSIS — J301 Allergic rhinitis due to pollen: Secondary | ICD-10-CM | POA: Diagnosis not present

## 2023-11-26 DIAGNOSIS — I10 Essential (primary) hypertension: Secondary | ICD-10-CM

## 2023-11-26 MED ORDER — MONTELUKAST SODIUM 10 MG PO TABS: 10.0000 mg | ORAL_TABLET | Freq: Every day | ORAL | 2 refills | Status: AC

## 2023-11-26 MED ORDER — AMOXICILLIN 500 MG PO TABS: 2000.0000 mg | ORAL_TABLET | Freq: Once | ORAL | 3 refills | Status: AC

## 2023-11-26 NOTE — Patient Instructions (Signed)
 Please STOP amlodipine.

## 2023-11-26 NOTE — Progress Notes (Signed)
 Acute visit   Patient: Cindy Hester   DOB: 25-May-1945   78 y.o. Female  MRN: [id number] PCP: Sharma Coyer, MD   Chief Complaint  Patient presents with   Follow-up    F/u on Fall Pt has some concerns about her concussions    Subjective    Discussed the use of AI scribe software for clinical note transcription with the patient, who gave verbal consent to proceed.  History of Present Illness Cindy Hester is a 78 year old female who presents with persistent dizziness following a fall and concussion.  She has experienced persistent dizziness since a fall on August 31, 2023, which resulted in a concussion. The dizziness is primarily positional, occurring when lying down to sleep or transitioning from sitting to standing. Initially, the dizziness was more severe in the first three to four weeks following the fall but has since improved slightly. An MRI was performed; the patient was not informed of the results prior to this visit.  The fall occurred while on vacation at [facility], where she suddenly found herself on the ground without any preceding dizziness, loss of consciousness, or tripping over an obstacle. Her daughter described the fall as 'falling like a board,' indicating no protective reflexes were engaged. She has a history of a previous fall in May 2025, attributed to slipping on gravel.  She is on multiple blood pressure medications, including amlodipine , which was previously reduced by her cardiologist. Her blood pressure today was noted to be on the lower side at 119/53 mmHg. She has not started any new blood pressure medications recently.   Her past medical history includes diabetes, for which she experiences neuropathy. She takes Cymbalta  (duloxetine ) for neuropathy, which has helped alleviate burning sensations in her legs. 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  Family history is significant for  heart disease, with her father and two brothers having had heart attacks. She has been followed by a cardiologist for baseline assessments due to this family history.  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 She holds degrees in psychology and social work from [employer].   BP Readings from Last 3 Encounters:  11/26/23 (!) 119/53  09/26/23 (!) 153/59  08/21/23 106/68    Review of systems as noted in HPI.   Objective    BP (!) 119/53 (BP Location: Left Arm, Patient Position: Sitting, Cuff Size: Normal)   Pulse 73   Resp 16   Ht 5' 6 (1.676 m)   Wt 163 lb (73.9 kg)   SpO2 100%   BMI 26.31 kg/m  Physical Exam Constitutional:      Appearance: Normal appearance.  HENT:     Head: Normocephalic and atraumatic.     Mouth/Throat:     Mouth: Mucous membranes are moist.  Eyes:     Pupils: Pupils are equal, round, and reactive to light.  Cardiovascular:     Rate and Rhythm: Normal rate and regular rhythm.     Heart sounds: Normal heart sounds.  Pulmonary:     Effort: Pulmonary effort is normal.     Breath sounds: Normal breath sounds.  Skin:    General: Skin is warm.  Neurological:     General: No focal deficit present.     Mental Status: She is alert.  No results found for any visits on 11/26/23.  Assessment & Plan     Problem List Items Addressed This Visit       Cardiovascular and Mediastinum   Essential hypertension, benign     Respiratory   Allergic rhinitis   Chronic, controlled. Refilled Singulair .      Relevant Medications   montelukast  (SINGULAIR ) 10 MG tablet     Other   HLD (hyperlipidemia)   Chronic, controlled. Takes Crestor  40mg  daily. Recheck lipid panel today.      Relevant Orders   Lipid panel   Hx of total hip arthroplasty, left   Patient was told by orthopedic surgeon that she should take  amoxicillin  before any dental work due to hx of hip replacement. Will refill today.      Relevant Medications   amoxicillin  (AMOXIL ) 500 MG tablet   Other Visit Diagnoses       Dizziness    -  Primary   Relevant Orders   CBC   Comprehensive metabolic panel with GFR   TSH     Need for vaccination       Relevant Orders   Flu vaccine HIGH DOSE PF(Fluzone Trivalent) (Completed)       Assessment & Plan Concussion with persistent dizziness Persistent dizziness post-fall in July, MRI normal. Follows with neurology. Concern that dizziness may be 2/2 orthostatic hypotension given dizziness tends to be positional in nature. Possibly linked to antihypertensive medication, dehydration, or vertigo. - Stop amlodipine  to assess impact on dizziness. - Continue other blood pressure medications for now - Will evaluate for anemia, electrolyte disturbance, thyroid  disorder - Follow up with neurology, can take meclizine as recommended - Follow up with PCP in 1 month  Hypertension Blood pressure 119/53, potentially overmedicated with four antihypertensives. - Stop amlodipine . - Monitor blood pressure at home.  General Health Maintenance Flu shot due, COVID booster recommended. - Administer flu shot today. - Recommend COVID booster at local pharmacy.     Meds ordered this encounter  Medications   montelukast  (SINGULAIR ) 10 MG tablet    Sig: Take 1 tablet (10 mg total) by mouth daily.    Dispense:  90 tablet    Refill:  2    ZERO refills remain on this prescription. Your patient is requesting advance approval of refills for this medication to PREVENT ANY MISSED DOSES   amoxicillin  (AMOXIL ) 500 MG tablet    Sig: Take 4 tablets (2,000 mg total) by mouth once for 1 dose. Take 4 tablets 1 hour before dental appt.    Dispense:  4 tablet    Refill:  3     Return in about 4 weeks (around 12/24/2023) for Annual Physical Exam.      Cindy DELENA Pepper, MD  [facility] [phone] (phone) [phone] (fax)

## 2023-11-26 NOTE — Assessment & Plan Note (Signed)
 Chronic, controlled. Takes Crestor  40mg  daily. Recheck lipid panel today.

## 2023-11-26 NOTE — Assessment & Plan Note (Signed)
 Chronic, controlled. Refilled Singulair .

## 2023-11-26 NOTE — Assessment & Plan Note (Signed)
 Patient was told by orthopedic surgeon that she should take amoxicillin  before any dental work due to hx of hip replacement. Will refill today.

## 2023-11-27 ENCOUNTER — Ambulatory Visit: Payer: Self-pay

## 2023-11-27 LAB — COMPREHENSIVE METABOLIC PANEL WITH GFR
ALT: 12 IU/L (ref 0–32)
AST: 17 IU/L (ref 0–40)
Albumin: 4.5 g/dL (ref 3.8–4.8)
Alkaline Phosphatase: 68 IU/L (ref 49–135)
BUN/Creatinine Ratio: 22 (ref 12–28)
BUN: 20 mg/dL (ref 8–27)
Bilirubin Total: 0.3 mg/dL (ref 0.0–1.2)
CO2: 26 mmol/L (ref 20–29)
Calcium: 9.9 mg/dL (ref 8.7–10.3)
Chloride: 103 mmol/L (ref 96–106)
Creatinine, Ser: 0.89 mg/dL (ref 0.57–1.00)
Globulin, Total: 1.9 g/dL (ref 1.5–4.5)
Glucose: 114 mg/dL — ABNORMAL HIGH (ref 70–99)
Potassium: 5.2 mmol/L (ref 3.5–5.2)
Sodium: 142 mmol/L (ref 134–144)
Total Protein: 6.4 g/dL (ref 6.0–8.5)
eGFR: 67 mL/min/1.73 (ref >59)

## 2023-11-27 LAB — LIPID PANEL
Chol/HDL Ratio: 2.5 ratio (ref 0.0–4.4)
Cholesterol, Total: 151 mg/dL (ref 100–199)
HDL: 61 mg/dL (ref >39)
LDL Chol Calc (NIH): 72 mg/dL (ref 0–99)
Triglycerides: 97 mg/dL (ref 0–149)
VLDL Cholesterol Cal: 18 mg/dL (ref 5–40)

## 2023-11-27 LAB — CBC
Hematocrit: 45.3 % (ref 34.0–46.6)
Hemoglobin: 14.8 g/dL (ref 11.1–15.9)
MCH: 29.3 pg (ref 26.6–33.0)
MCHC: 32.7 g/dL (ref 31.5–35.7)
MCV: 90 fL (ref 79–97)
Platelets: 321 x10E3/uL (ref 150–450)
RBC: 5.05 x10E6/uL (ref 3.77–5.28)
RDW: 13.2 % (ref 11.7–15.4)
WBC: 5.8 x10E3/uL (ref 3.4–10.8)

## 2023-11-27 LAB — TSH: TSH: 1.63 u[IU]/mL (ref 0.450–4.500)

## 2023-12-03 DIAGNOSIS — J3089 Other allergic rhinitis: Secondary | ICD-10-CM | POA: Diagnosis not present

## 2023-12-03 DIAGNOSIS — J301 Allergic rhinitis due to pollen: Secondary | ICD-10-CM | POA: Diagnosis not present

## 2023-12-04 DIAGNOSIS — R2689 Other abnormalities of gait and mobility: Secondary | ICD-10-CM | POA: Diagnosis not present

## 2023-12-04 DIAGNOSIS — F0781 Postconcussional syndrome: Secondary | ICD-10-CM | POA: Diagnosis not present

## 2023-12-04 DIAGNOSIS — G629 Polyneuropathy, unspecified: Secondary | ICD-10-CM | POA: Diagnosis not present

## 2023-12-04 DIAGNOSIS — R42 Dizziness and giddiness: Secondary | ICD-10-CM | POA: Diagnosis not present

## 2023-12-06 DIAGNOSIS — J3089 Other allergic rhinitis: Secondary | ICD-10-CM | POA: Diagnosis not present

## 2023-12-06 DIAGNOSIS — J301 Allergic rhinitis due to pollen: Secondary | ICD-10-CM | POA: Diagnosis not present

## 2023-12-06 DIAGNOSIS — J3081 Allergic rhinitis due to animal (cat) (dog) hair and dander: Secondary | ICD-10-CM | POA: Diagnosis not present

## 2023-12-12 DIAGNOSIS — J3089 Other allergic rhinitis: Secondary | ICD-10-CM | POA: Diagnosis not present

## 2023-12-12 DIAGNOSIS — J3081 Allergic rhinitis due to animal (cat) (dog) hair and dander: Secondary | ICD-10-CM | POA: Diagnosis not present

## 2023-12-12 DIAGNOSIS — J301 Allergic rhinitis due to pollen: Secondary | ICD-10-CM | POA: Diagnosis not present

## 2023-12-21 ENCOUNTER — Other Ambulatory Visit: Payer: Self-pay | Admitting: Family Medicine

## 2023-12-21 DIAGNOSIS — G629 Polyneuropathy, unspecified: Secondary | ICD-10-CM

## 2023-12-27 DIAGNOSIS — J301 Allergic rhinitis due to pollen: Secondary | ICD-10-CM | POA: Diagnosis not present

## 2023-12-27 DIAGNOSIS — J3089 Other allergic rhinitis: Secondary | ICD-10-CM | POA: Diagnosis not present

## 2023-12-27 DIAGNOSIS — J3081 Allergic rhinitis due to animal (cat) (dog) hair and dander: Secondary | ICD-10-CM | POA: Diagnosis not present

## 2024-01-01 ENCOUNTER — Other Ambulatory Visit: Payer: Self-pay | Admitting: Cardiology

## 2024-01-01 DIAGNOSIS — I10 Essential (primary) hypertension: Secondary | ICD-10-CM

## 2024-01-06 ENCOUNTER — Encounter: Payer: Self-pay | Admitting: Dermatology

## 2024-01-06 ENCOUNTER — Ambulatory Visit: Admitting: Dermatology

## 2024-01-06 DIAGNOSIS — B353 Tinea pedis: Secondary | ICD-10-CM

## 2024-01-06 DIAGNOSIS — L988 Other specified disorders of the skin and subcutaneous tissue: Secondary | ICD-10-CM

## 2024-01-06 DIAGNOSIS — D2372 Other benign neoplasm of skin of left lower limb, including hip: Secondary | ICD-10-CM

## 2024-01-06 NOTE — Patient Instructions (Addendum)
 Start over the counter Terbinafine cream to the feet and between the toes twice daily for one month. Then apply once a week to help prevent recurrence.   Due to recent changes in healthcare laws, you may see results of your pathology and/or laboratory studies on MyChart before the doctors have had a chance to review them. We understand that in some cases there may be results that are confusing or concerning to you. Please understand that not all results are received at the same time and often the doctors may need to interpret multiple results in order to provide you with the best plan of care or course of treatment. Therefore, we ask that you please give us  2 business days to thoroughly review all your results before contacting the office for clarification. Should we see a critical lab result, you will be contacted sooner.   If You Need Anything After Your Visit  If you have any questions or concerns for your doctor, please call our main line at [phone] and press option 4 to reach your doctor's medical assistant. If no one answers, please leave a voicemail as directed and we will return your call as soon as possible. Messages left after 4 pm will be answered the following business day.   You may also send us  a message via MyChart. We typically respond to MyChart messages within 1-2 business days.  For prescription refills, please ask your pharmacy to contact our office. Our fax number is [phone].  If you have an urgent issue when the clinic is closed that cannot wait until the next business day, you can page your doctor at the number below.    Please note that while we do our best to be available for urgent issues outside of office hours, we are not available 24/7.   If you have an urgent issue and are unable to reach us , you may choose to seek medical care at your doctor's office, retail clinic, urgent care center, or emergency room.  If you have a medical emergency, please immediately  call 911 or go to the emergency department.  Pager Numbers  - Dr. Hester: [phone]  - Dr. Jackquline: [phone]  - Dr. Claudene: [phone]   - Dr. Raymund: [phone]  In the event of inclement weather, please call our main line at [phone] for an update on the status of any delays or closures.  Dermatology Medication Tips: Please keep the boxes that topical medications come in in order to help keep track of the instructions about where and how to use these. Pharmacies typically print the medication instructions only on the boxes and not directly on the medication tubes.   If your medication is too expensive, please contact our office at [phone] option 4 or send us  a message through MyChart.   We are unable to tell what your co-pay for medications will be in advance as this is different depending on your insurance coverage. However, we may be able to find a substitute medication at lower cost or fill out paperwork to get insurance to cover a needed medication.   If a prior authorization is required to get your medication covered by your insurance company, please allow us  1-2 business days to complete this process.  Drug prices often vary depending on where the prescription is filled and some pharmacies may offer cheaper prices.  The website www.goodrx.com contains coupons for medications through different pharmacies. The prices here do not account for what the cost may be with help from  insurance (it may be cheaper with your insurance), but the website can give you the price if you did not use any insurance.  - You can print the associated coupon and take it with your prescription to the pharmacy.  - You may also stop by our office during regular business hours and pick up a GoodRx coupon card.  - If you need your prescription sent electronically to a different pharmacy, notify our office through [facility] or by phone at [phone] option 4.     Si Usted  Necesita Algo Despus de Su Visita  Tambin puede enviarnos un mensaje a travs de Clinical Cytogeneticist. Por lo general respondemos a los mensajes de MyChart en el transcurso de 1 a 2 das hbiles.  Para renovar recetas, por favor pida a su farmacia que se ponga en contacto con nuestra oficina. Randi lakes de fax es [place] [phone].  Si tiene un asunto urgente cuando la clnica est cerrada y que no puede esperar hasta el siguiente da hbil, puede llamar/localizar a su doctor(a) al nmero que aparece a continuacin.   Por favor, tenga en cuenta que aunque hacemos todo lo posible para estar disponibles para asuntos urgentes fuera del horario de [place], no estamos disponibles las 24 horas del da, los 7 [address]  [address] de la [place].   Si tiene un problema urgente y no puede comunicarse con nosotros, puede optar por buscar atencin mdica  en el consultorio de su doctor(a), en una clnica privada, en un centro de atencin urgente o en una sala de emergencias.  Si tiene engineer, drilling, por favor llame inmediatamente al 911 o vaya a la sala de emergencias.  Nmeros de bper  - Dr. Hester: [phone]  - Dra. Jackquline: [phone]  - Dr. Claudene: [phone]  - Dra. Kitts: [phone]  En caso de inclemencias del [place], por favor llame a nuestra lnea principal al [phone] para una actualizacin sobre el estado de cualquier retraso o cierre.  Consejos para la medicacin en dermatologa: Por favor, guarde las cajas en las que vienen los medicamentos de uso tpico para ayudarle a seguir las instrucciones sobre dnde y cmo usarlos. Las farmacias generalmente imprimen las instrucciones del medicamento slo en las cajas y no directamente en los tubos del [place].   Si su medicamento es muy caro, por favor, pngase en contacto con landry rieger llamando al [phone] y presione la opcin 4 o envenos un mensaje a travs de Clinical Cytogeneticist.   No podemos decirle cul ser su copago por los medicamentos  por adelantado ya que esto es diferente dependiendo de la cobertura de su seguro. Sin embargo, es posible que podamos encontrar un medicamento sustituto a audiological scientist un formulario para que el seguro cubra el medicamento que se considera necesario.   Si se requiere una autorizacin previa para que su compaa de seguros cubra su medicamento, por favor permtanos de 1 a 2 das hbiles para completar este proceso.  Los precios de los medicamentos varan con frecuencia dependiendo del environmental consultant de dnde se surte la receta y alguna farmacias pueden ofrecer precios ms baratos.  El sitio web www.goodrx.com tiene cupones para medicamentos de health and safety inspector. Los precios aqu no tienen en cuenta lo que podra costar con la ayuda del seguro (puede ser ms barato con su seguro), pero el sitio web puede darle el precio si no utiliz tourist information centre manager.  - Puede imprimir el cupn correspondiente y llevarlo con su receta a la farmacia.  - Tambin puede pasar por nuestra  oficina durante el horario de atencin regular y education officer, museum una tarjeta de cupones de GoodRx.  - Si necesita que su receta se enve electrnicamente a una farmacia diferente, informe a nuestra oficina a travs de MyChart de [facility] o por telfono llamando al [phone] y presione la opcin 4.

## 2024-01-06 NOTE — Progress Notes (Signed)
 Follow-Up Visit   Subjective  Cindy Hester is a 78 y.o. female who presents for the following: filler for facial elastosis, check spot L foot, no symptoms, patient was having a pedicure and the nail tech noticed  The following portions of the chart were reviewed this encounter and updated as appropriate: medications, allergies, medical history  Review of Systems:  No other skin or systemic complaints except as noted in HPI or Assessment and Plan.  Objective  Well appearing patient in no apparent distress; mood and affect are within normal limits.  A focused examination was performed of the face and left foot. Relevant physical exam findings are noted in the Assessment and Plan or shown in photos.  Before photos                 After photos                 Injection map photo    L plantar foot at ball      Assessment & Plan   TINEA PEDIS Exam: L web spaces and toes with erythema and maceration   Treatment Plan: Start otc Lamisil cream bid x 2-4 weeks   HEMANGIOMA vs HEMATOMA vs FB GRANULOMA L plantar foot at ball Exam: 4.29mm violaceous slightly firm nodule with superficial red macule , no melanin seen under dermoscopy, photo today  Treatment Plan: Benign appearing, asymptomatic, observe for change, will recheck on f/up   Facial Elastosis  Prior to the procedure, the patient's past medical history, allergies and the rare but potential risks and complications were reviewed with the patient and a signed consent was obtained. Pre and post-treatment care was discussed and instructions provided.   Location: BL cheeks at zygoma (Voluma 0.5 cc each side), vertical lines upper lip (0.5cc Refyne), marionette lines (0.5cc Refyne)  Filler Type: Restylane refyne and Juvederm Voluma  Procedure: The area was prepped thoroughly with Puracyn. After introducing the needle into the desired treatment area, the syringe plunger was drawn back to  ensure there was no flash of blood prior to injecting the filler in order to minimize risk of intravascular injection and vascular occlusion. After injection of the filler, the treated areas were cleansed and iced to reduce swelling. Post-treatment instructions were reviewed with the patient.       Patient tolerated the procedure well. The patient will call with any problems, questions or concerns prior to their next appointment.  Facial Elastosis Botox 6 units injected today to: - Upper lip 6 units Location: upper lip vermilion edge for lip flip  Informed consent: Discussed risks (infection, pain, bleeding, bruising, swelling, allergic reaction, paralysis of nearby muscles, eyelid droop, double vision, neck weakness, difficulty breathing, headache, undesirable cosmetic result, and need for additional treatment) and benefits of the procedure, as well as the alternatives.  Informed consent was obtained.  Preparation: The area was cleansed with alcohol.  Procedure Details:  Botox was injected into the dermis with a 30-gauge needle. Pressure applied to any bleeding. Ice packs offered for swelling.  Lot Number:  [id number] Expiration:  12/2025  Total Units Injected:  6  Plan: Tylenol  may be used for headache.  Allow 2 weeks before returning to clinic for additional dosing as needed. Patient will call for any problems.     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  I, Grayce Saunas, RMA, am acting as scribe for Rexene Rattler, MD .   Documentation: I have reviewed  the above documentation for accuracy and completeness, and I agree with the above.  Rexene Rattler, MD

## 2024-01-07 DIAGNOSIS — J301 Allergic rhinitis due to pollen: Secondary | ICD-10-CM | POA: Diagnosis not present

## 2024-01-07 DIAGNOSIS — J3089 Other allergic rhinitis: Secondary | ICD-10-CM | POA: Diagnosis not present

## 2024-01-07 DIAGNOSIS — J3081 Allergic rhinitis due to animal (cat) (dog) hair and dander: Secondary | ICD-10-CM | POA: Diagnosis not present

## 2024-01-17 ENCOUNTER — Ambulatory Visit

## 2024-01-17 VITALS — BP 171/61 | HR 82

## 2024-01-17 DIAGNOSIS — J301 Allergic rhinitis due to pollen: Secondary | ICD-10-CM | POA: Diagnosis not present

## 2024-01-17 DIAGNOSIS — R269 Unspecified abnormalities of gait and mobility: Secondary | ICD-10-CM | POA: Diagnosis present

## 2024-01-17 DIAGNOSIS — J3081 Allergic rhinitis due to animal (cat) (dog) hair and dander: Secondary | ICD-10-CM | POA: Diagnosis not present

## 2024-01-17 DIAGNOSIS — R2681 Unsteadiness on feet: Secondary | ICD-10-CM | POA: Diagnosis present

## 2024-01-17 DIAGNOSIS — R42 Dizziness and giddiness: Secondary | ICD-10-CM | POA: Diagnosis present

## 2024-01-17 DIAGNOSIS — M6281 Muscle weakness (generalized): Secondary | ICD-10-CM | POA: Insufficient documentation

## 2024-01-17 DIAGNOSIS — J3089 Other allergic rhinitis: Secondary | ICD-10-CM | POA: Diagnosis not present
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
## 2024-01-21 ENCOUNTER — Ambulatory Visit

## 2024-01-21 ENCOUNTER — Other Ambulatory Visit: Payer: Self-pay | Admitting: Family Medicine

## 2024-01-21 NOTE — Telephone Encounter (Unsigned)
 Copied from CRM #[id number]. Topic: Clinical - Medication Refill >> Jan 21, 2024  9:38 AM Everette C wrote: Medication: docusate sodium  (COLACE) 100 MG capsule [[id number]]  Has the patient contacted their pharmacy? Yes (Agent: If no, request that the patient contact the pharmacy for the refill. If patient does not wish to contact the pharmacy document the reason why and proceed with request.) (Agent: If yes, when and what did the pharmacy advise?)  This is the patient's preferred pharmacy:  [id number] [place], [place] - [address] AT [facility] OF [address] [place] [address] [place] [address] Phone: [phone] Fax: [phone]  Is this the correct pharmacy for this prescription? Yes If no, delete pharmacy and type the correct one.   Has the prescription been filled recently? Yes  Is the patient out of the medication? Yes  Has the patient been seen for an appointment in the last year OR does the patient have an upcoming appointment? Yes  Can we respond through MyChart? No  Agent: Please be advised that Rx refills may take up to 3 business days. We ask that you follow-up with your pharmacy.

## 2024-01-22 ENCOUNTER — Ambulatory Visit

## 2024-01-22 DIAGNOSIS — R2681 Unsteadiness on feet: Secondary | ICD-10-CM | POA: Diagnosis not present

## 2024-01-22 DIAGNOSIS — R42 Dizziness and giddiness: Secondary | ICD-10-CM
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
## 2024-01-24 DIAGNOSIS — J3081 Allergic rhinitis due to animal (cat) (dog) hair and dander: Secondary | ICD-10-CM | POA: Diagnosis not present

## 2024-01-24 DIAGNOSIS — J301 Allergic rhinitis due to pollen: Secondary | ICD-10-CM | POA: Diagnosis not present

## 2024-01-24 DIAGNOSIS — J3089 Other allergic rhinitis: Secondary | ICD-10-CM | POA: Diagnosis not present

## 2024-01-24 MED ORDER — DOCUSATE SODIUM 100 MG PO CAPS: 100.0000 mg | ORAL_CAPSULE | Freq: Two times a day (BID) | ORAL | 1 refills | Status: AC

## 2024-01-24 NOTE — Telephone Encounter (Signed)
 Requested medication (s) are due for refill today: yes  Requested medication (s) are on the active medication list: yes  Last refill:  09/20/21  Future visit scheduled: yes  Notes to clinic:  historical medication     Requested Prescriptions  Pending Prescriptions Disp Refills   docusate sodium  (COLACE) 100 MG capsule 10 capsule     Sig: Take 1 capsule (100 mg total) by mouth 2 (two) times daily.     Over the Counter:  OTC Passed - 01/24/2024  8:55 AM      Passed - Valid encounter within last 12 months    Recent Outpatient Visits           1 month ago Dizziness   [facility] Franchot Isaiah LABOR, MD   5 months ago Primary hypertension   [facility] Sharma Coyer, MD       Future Appointments             In 2 months Jackquline Sawyer, MD [facility]

## 2024-01-29 ENCOUNTER — Ambulatory Visit

## 2024-01-29 VITALS — BP 181/65 | HR 78

## 2024-01-29 DIAGNOSIS — R2681 Unsteadiness on feet: Secondary | ICD-10-CM

## 2024-01-29 DIAGNOSIS — M6281 Muscle weakness (generalized): Secondary | ICD-10-CM

## 2024-01-29 DIAGNOSIS — R269 Unspecified abnormalities of gait and mobility: Secondary | ICD-10-CM

## 2024-01-29 DIAGNOSIS — R42 Dizziness and giddiness: Secondary | ICD-10-CM
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
## 2024-02-03 ENCOUNTER — Encounter: Payer: Self-pay | Admitting: Dermatology

## 2024-02-03 ENCOUNTER — Ambulatory Visit (INDEPENDENT_AMBULATORY_CARE_PROVIDER_SITE_OTHER): Payer: Self-pay | Admitting: Dermatology

## 2024-02-03 DIAGNOSIS — L988 Other specified disorders of the skin and subcutaneous tissue: Secondary | ICD-10-CM

## 2024-02-03 NOTE — Progress Notes (Signed)
" ° °  Follow-Up Visit   Subjective  Cindy Hester is a 78 y.o. female who presents for the following: filler for facial elastosis  The following portions of the chart were reviewed this encounter and updated as appropriate: medications, allergies, medical history  Review of Systems:  No other skin or systemic complaints except as noted in HPI or Assessment and Plan.  Objective  Well appearing patient in no apparent distress; mood and affect are within normal limits.  A focused examination was performed of the face. Relevant physical exam findings are noted in the Assessment and Plan or shown in photos.              I         Head - Anterior (Face) Rhytides and volume loss.   Assessment & Plan   ELASTOSIS OF SKIN Head - Anterior (Face) Botox - 48 units as below Frown Complex 32.5 units- may consider decreasing amount to frown complex on f/up Chin 7.5 units DAO's 4 units x 2 - Filling material injection - Head - Anterior (Face) Location: See attached image  Informed consent: Discussed risks (infection, pain, bleeding, bruising, swelling, allergic reaction, paralysis of nearby muscles, eyelid droop, double vision, neck weakness, difficulty breathing, headache, undesirable cosmetic result, and need for additional treatment) and benefits of the procedure, as well as the alternatives.  Informed consent was obtained.  Preparation: The area was cleansed with alcohol.  Procedure Details:  Botox was injected into the dermis with a 30-gauge needle. Pressure applied to any bleeding. Ice packs offered for swelling.  Lot Number:  [id number] Expiration:  01/2026  Total Units Injected:  48  Plan: Tylenol  may be used for headache.  Allow 2 weeks before returning to clinic for additional dosing as needed. Patient will call for any problems.   - Filling material injection - Head - Anterior (Face) Prior to the procedure, the patient's past medical history, allergies and the  rare but potential risks and complications were reviewed with the patient and a signed consent was obtained. Pre and post-treatment care was discussed and instructions provided.  Risks including vascular occlusion were discussed.   Location: See attached photo  Filler Type: Restylane defyne  Procedure: The area was prepped thoroughly with Puracyn. After introducing the needle into the desired treatment area, the syringe plunger was drawn back to ensure there was no flash of blood prior to injecting the filler in order to minimize risk of intravascular injection and vascular occlusion. After injection of the filler, the treated areas were cleansed and iced to reduce swelling. Post-treatment instructions were reviewed with the patient.       Patient tolerated the procedure well. The patient will call with any problems, questions or concerns prior to their next appointment.     Return as scheduled for Botox- 3 mos.  LILLETTE Lonell Drones, RMA, am acting as scribe for Rexene Rattler, MD .   Documentation: I have reviewed the above documentation for accuracy and completeness, and I agree with the above.  Rexene Rattler, MD      "

## 2024-02-03 NOTE — Patient Instructions (Signed)

## 2024-02-05 DIAGNOSIS — E119 Type 2 diabetes mellitus without complications: Secondary | ICD-10-CM | POA: Diagnosis not present

## 2024-02-05 DIAGNOSIS — E1159 Type 2 diabetes mellitus with other circulatory complications: Secondary | ICD-10-CM | POA: Diagnosis not present

## 2024-02-05 DIAGNOSIS — E559 Vitamin D deficiency, unspecified: Secondary | ICD-10-CM | POA: Diagnosis not present

## 2024-02-05 DIAGNOSIS — E538 Deficiency of other specified B group vitamins: Secondary | ICD-10-CM | POA: Diagnosis not present

## 2024-02-05 DIAGNOSIS — E1169 Type 2 diabetes mellitus with other specified complication: Secondary | ICD-10-CM | POA: Diagnosis not present

## 2024-02-10 ENCOUNTER — Ambulatory Visit: Attending: Neurology

## 2024-02-10 DIAGNOSIS — M542 Cervicalgia: Secondary | ICD-10-CM | POA: Insufficient documentation

## 2024-02-10 DIAGNOSIS — R42 Dizziness and giddiness: Secondary | ICD-10-CM | POA: Diagnosis present

## 2024-02-10 DIAGNOSIS — R2681 Unsteadiness on feet: Secondary | ICD-10-CM | POA: Diagnosis present
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
## 2024-02-11 ENCOUNTER — Ambulatory Visit: Admitting: Dermatology

## 2024-02-12 ENCOUNTER — Ambulatory Visit

## 2024-02-14 ENCOUNTER — Ambulatory Visit: Admitting: Family Medicine

## 2024-02-14 ENCOUNTER — Encounter: Payer: Self-pay | Admitting: Family Medicine

## 2024-02-14 VITALS — BP 134/60 | HR 71 | Temp 97.7°F | Ht 66.0 in | Wt 162.7 lb

## 2024-02-14 DIAGNOSIS — E119 Type 2 diabetes mellitus without complications: Secondary | ICD-10-CM

## 2024-02-14 DIAGNOSIS — Z7984 Long term (current) use of oral hypoglycemic drugs: Secondary | ICD-10-CM | POA: Diagnosis not present

## 2024-02-14 DIAGNOSIS — Z0000 Encounter for general adult medical examination without abnormal findings: Secondary | ICD-10-CM

## 2024-02-14 DIAGNOSIS — Z0001 Encounter for general adult medical examination with abnormal findings: Secondary | ICD-10-CM | POA: Diagnosis not present

## 2024-02-14 DIAGNOSIS — Z7985 Long-term (current) use of injectable non-insulin antidiabetic drugs: Secondary | ICD-10-CM

## 2024-02-14 NOTE — Patient Instructions (Addendum)
" °  VISIT SUMMARY: During your annual physical exam, we reviewed your history of hypertension, type 2 diabetes, hypothyroidism, vitamin D  deficiency, and other health concerns. Your blood pressure was elevated, likely due to work-related stress, and your A1c remains stable at 6.4%. We discussed your ongoing dizziness, peripheral neuropathy, and a pigmented skin lesion on your foot. Your general health maintenance is up to date.  YOUR PLAN: HYPERTENSION: Your blood pressure is elevated, likely due to stress from work-related issues. -We rechecked your blood pressure today. -If your blood pressure remains elevated, we will consider reintroducing amlodipine  at 5 mg. -We will schedule a follow-up in 3-4 months if your blood pressure remains elevated.  TYPE 2 DIABETES MELLITUS: Your A1c is well-controlled at 6.4%. -Continue your current diabetes management regimen with Jardiance and Mounjaro.  HYPERLIPIDEMIA: Your lipid panel results were not discussed in detail. -Continue your current lipid management regimen with Zetia  and Crestor .  VITAMIN D  DEFICIENCY: No specific discussion regarding current status or treatment adjustments. -Continue your current management for vitamin D  deficiency.  HYPOTHYROIDISM: Your thyroid  levels are normal as of October 2025. -Continue follow-up with endocrinology.  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 -Continue vestibular therapy. -Follow up with neurology in February.  PERIPHERAL NEUROPATHY OF FEET: You have decreased sensation in your feet. -Continue your current management for peripheral neuropathy.  PIGMENTED SKIN LESION OF FOOT (MONITORING FOR MALIGNANCY): A black spot on your foot was identified as a possible nevus or mole. -Continue to monitor for changes in the pigmented lesion.  GENERAL HEALTH MAINTENANCE: Your health maintenance is up to  date. -Continue routine health maintenance and screenings.                      Contains text generated by Abridge.                                 Contains text generated by Abridge.     To keep you healthy, please keep in mind the following health maintenance items that you are due for:   Health Maintenance Due  Topic Date Due   FOOT EXAM  11/06/2023   Diabetic kidney evaluation - Urine ACR  11/07/2023     Best Wishes,   Dr. Lang  "
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
## 2024-02-19 ENCOUNTER — Encounter: Admitting: Family Medicine

## 2024-02-19 ENCOUNTER — Ambulatory Visit

## 2024-02-19 DIAGNOSIS — R42 Dizziness and giddiness: Secondary | ICD-10-CM

## 2024-02-19 DIAGNOSIS — R2681 Unsteadiness on feet: Secondary | ICD-10-CM | POA: Diagnosis not present
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
## 2024-02-26 ENCOUNTER — Ambulatory Visit

## 2024-02-26 DIAGNOSIS — R2681 Unsteadiness on feet: Secondary | ICD-10-CM

## 2024-02-26 DIAGNOSIS — R42 Dizziness and giddiness: Secondary | ICD-10-CM
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
## 2024-03-04 ENCOUNTER — Ambulatory Visit

## 2024-03-04 DIAGNOSIS — R2681 Unsteadiness on feet: Secondary | ICD-10-CM

## 2024-03-04 DIAGNOSIS — R42 Dizziness and giddiness: Secondary | ICD-10-CM

## 2024-03-04 DIAGNOSIS — M542 Cervicalgia: Secondary | ICD-10-CM
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
## 2024-03-11 ENCOUNTER — Ambulatory Visit

## 2024-03-11 DIAGNOSIS — R42 Dizziness and giddiness: Secondary | ICD-10-CM
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
## 2024-03-18 ENCOUNTER — Ambulatory Visit

## 2024-03-25 ENCOUNTER — Ambulatory Visit

## 2024-04-01 ENCOUNTER — Ambulatory Visit

## 2024-04-08 ENCOUNTER — Ambulatory Visit

## 2024-04-13 ENCOUNTER — Ambulatory Visit: Admitting: Dermatology

## 2024-04-15 ENCOUNTER — Ambulatory Visit

## 2024-04-22 ENCOUNTER — Ambulatory Visit

## 2024-05-13 ENCOUNTER — Ambulatory Visit

## 2024-05-20 ENCOUNTER — Ambulatory Visit

## 2024-05-27 ENCOUNTER — Ambulatory Visit

## 2024-06-03 ENCOUNTER — Ambulatory Visit

## 2024-06-10 ENCOUNTER — Ambulatory Visit

## 2024-06-17 ENCOUNTER — Ambulatory Visit

## 2024-06-24 ENCOUNTER — Ambulatory Visit

## 2024-08-14 ENCOUNTER — Ambulatory Visit: Admitting: Family Medicine

## 2024-08-17 ENCOUNTER — Ambulatory Visit: Admitting: Dermatology
